# Patient Record
Sex: Female | Born: 1980
Health system: Southern US, Community
[De-identification: ages and names within clinical notes are randomized; demographics above are authoritative.]

## PROBLEM LIST (undated history)

## (undated) ENCOUNTER — Inpatient Hospital Stay (HOSPITAL_COMMUNITY): Payer: Self-pay

## (undated) DIAGNOSIS — D649 Anemia, unspecified: Secondary | ICD-10-CM

## (undated) DIAGNOSIS — R112 Nausea with vomiting, unspecified: Secondary | ICD-10-CM

## (undated) DIAGNOSIS — J189 Pneumonia, unspecified organism: Secondary | ICD-10-CM

## (undated) DIAGNOSIS — R87629 Unspecified abnormal cytological findings in specimens from vagina: Secondary | ICD-10-CM

## (undated) DIAGNOSIS — R51 Headache: Secondary | ICD-10-CM

## (undated) DIAGNOSIS — D219 Benign neoplasm of connective and other soft tissue, unspecified: Secondary | ICD-10-CM

## (undated) DIAGNOSIS — R7989 Other specified abnormal findings of blood chemistry: Secondary | ICD-10-CM

## (undated) DIAGNOSIS — Z86718 Personal history of other venous thrombosis and embolism: Secondary | ICD-10-CM

## (undated) DIAGNOSIS — E876 Hypokalemia: Secondary | ICD-10-CM

## (undated) DIAGNOSIS — J302 Other seasonal allergic rhinitis: Secondary | ICD-10-CM

## (undated) DIAGNOSIS — Z8742 Personal history of other diseases of the female genital tract: Secondary | ICD-10-CM

## (undated) DIAGNOSIS — F988 Other specified behavioral and emotional disorders with onset usually occurring in childhood and adolescence: Secondary | ICD-10-CM

## (undated) DIAGNOSIS — R011 Cardiac murmur, unspecified: Secondary | ICD-10-CM

## (undated) DIAGNOSIS — K5090 Crohn's disease, unspecified, without complications: Secondary | ICD-10-CM

## (undated) DIAGNOSIS — I10 Essential (primary) hypertension: Secondary | ICD-10-CM

## (undated) DIAGNOSIS — G43019 Migraine without aura, intractable, without status migrainosus: Secondary | ICD-10-CM

## (undated) DIAGNOSIS — K529 Noninfective gastroenteritis and colitis, unspecified: Secondary | ICD-10-CM

## (undated) DIAGNOSIS — Z9889 Other specified postprocedural states: Secondary | ICD-10-CM

## (undated) DIAGNOSIS — F419 Anxiety disorder, unspecified: Secondary | ICD-10-CM

## (undated) DIAGNOSIS — R55 Syncope and collapse: Secondary | ICD-10-CM

## (undated) HISTORY — DX: Syncope and collapse: R55

## (undated) HISTORY — DX: Migraine without aura, intractable, without status migrainosus: G43.019

## (undated) HISTORY — PX: TYMPANOSTOMY TUBE PLACEMENT: SHX32

## (undated) HISTORY — DX: Noninfective gastroenteritis and colitis, unspecified: K52.9

## (undated) HISTORY — DX: Other specified behavioral and emotional disorders with onset usually occurring in childhood and adolescence: F98.8

## (undated) HISTORY — PX: ADENOIDECTOMY: SUR15

## (undated) HISTORY — PX: TONSILLECTOMY: SUR1361

## (undated) HISTORY — PX: ENDOMETRIAL ABLATION: SHX621

---

## 1999-07-13 ENCOUNTER — Emergency Department (HOSPITAL_COMMUNITY): Admission: EM | Admit: 1999-07-13 | Discharge: 1999-07-13 | Payer: Self-pay | Admitting: Emergency Medicine

## 2000-03-06 ENCOUNTER — Ambulatory Visit (HOSPITAL_COMMUNITY): Admission: RE | Admit: 2000-03-06 | Discharge: 2000-03-06 | Payer: Self-pay | Admitting: *Deleted

## 2000-04-27 ENCOUNTER — Inpatient Hospital Stay (HOSPITAL_COMMUNITY): Admission: AD | Admit: 2000-04-27 | Discharge: 2000-04-30 | Payer: Self-pay | Admitting: *Deleted

## 2000-12-02 ENCOUNTER — Emergency Department (HOSPITAL_COMMUNITY): Admission: EM | Admit: 2000-12-02 | Discharge: 2000-12-03 | Payer: Self-pay | Admitting: Emergency Medicine

## 2001-03-31 ENCOUNTER — Encounter: Payer: Self-pay | Admitting: Emergency Medicine

## 2001-03-31 ENCOUNTER — Emergency Department (HOSPITAL_COMMUNITY): Admission: EM | Admit: 2001-03-31 | Discharge: 2001-03-31 | Payer: Self-pay | Admitting: *Deleted

## 2002-10-28 ENCOUNTER — Other Ambulatory Visit: Admission: RE | Admit: 2002-10-28 | Discharge: 2002-10-28 | Payer: Self-pay | Admitting: Obstetrics and Gynecology

## 2003-02-27 ENCOUNTER — Inpatient Hospital Stay (HOSPITAL_COMMUNITY): Admission: EM | Admit: 2003-02-27 | Discharge: 2003-03-01 | Payer: Self-pay | Admitting: *Deleted

## 2003-02-27 ENCOUNTER — Encounter: Payer: Self-pay | Admitting: *Deleted

## 2003-03-01 ENCOUNTER — Encounter: Payer: Self-pay | Admitting: *Deleted

## 2003-11-02 ENCOUNTER — Other Ambulatory Visit: Admission: RE | Admit: 2003-11-02 | Discharge: 2003-11-02 | Payer: Self-pay | Admitting: Obstetrics and Gynecology

## 2005-01-04 ENCOUNTER — Other Ambulatory Visit: Admission: RE | Admit: 2005-01-04 | Discharge: 2005-01-04 | Payer: Self-pay | Admitting: Obstetrics and Gynecology

## 2005-05-03 ENCOUNTER — Emergency Department (HOSPITAL_COMMUNITY): Admission: EM | Admit: 2005-05-03 | Discharge: 2005-05-03 | Payer: Self-pay | Admitting: *Deleted

## 2005-05-14 ENCOUNTER — Ambulatory Visit: Payer: Self-pay | Admitting: Gastroenterology

## 2005-05-21 ENCOUNTER — Ambulatory Visit: Payer: Self-pay | Admitting: Gastroenterology

## 2005-05-21 ENCOUNTER — Ambulatory Visit (HOSPITAL_COMMUNITY): Admission: RE | Admit: 2005-05-21 | Discharge: 2005-05-21 | Payer: Self-pay | Admitting: Gastroenterology

## 2005-06-12 ENCOUNTER — Ambulatory Visit: Payer: Self-pay | Admitting: Gastroenterology

## 2005-06-18 ENCOUNTER — Encounter (INDEPENDENT_AMBULATORY_CARE_PROVIDER_SITE_OTHER): Payer: Self-pay | Admitting: *Deleted

## 2005-06-18 ENCOUNTER — Ambulatory Visit: Payer: Self-pay | Admitting: Gastroenterology

## 2005-06-28 ENCOUNTER — Ambulatory Visit: Payer: Self-pay | Admitting: Gastroenterology

## 2005-07-02 ENCOUNTER — Ambulatory Visit (HOSPITAL_COMMUNITY): Admission: RE | Admit: 2005-07-02 | Discharge: 2005-07-02 | Payer: Self-pay | Admitting: Gastroenterology

## 2005-08-14 ENCOUNTER — Ambulatory Visit: Payer: Self-pay | Admitting: Gastroenterology

## 2006-02-06 ENCOUNTER — Other Ambulatory Visit: Admission: RE | Admit: 2006-02-06 | Discharge: 2006-02-06 | Payer: Self-pay | Admitting: Obstetrics and Gynecology

## 2006-02-18 ENCOUNTER — Emergency Department (HOSPITAL_COMMUNITY): Admission: EM | Admit: 2006-02-18 | Discharge: 2006-02-18 | Payer: Self-pay | Admitting: Emergency Medicine

## 2006-02-20 ENCOUNTER — Emergency Department (HOSPITAL_COMMUNITY): Admission: EM | Admit: 2006-02-20 | Discharge: 2006-02-20 | Payer: Self-pay | Admitting: Emergency Medicine

## 2006-03-11 ENCOUNTER — Emergency Department (HOSPITAL_COMMUNITY): Admission: EM | Admit: 2006-03-11 | Discharge: 2006-03-12 | Payer: Self-pay | Admitting: Emergency Medicine

## 2006-03-13 ENCOUNTER — Ambulatory Visit: Payer: Self-pay | Admitting: Gastroenterology

## 2006-09-22 ENCOUNTER — Inpatient Hospital Stay (HOSPITAL_COMMUNITY): Admission: AD | Admit: 2006-09-22 | Discharge: 2006-09-22 | Payer: Self-pay | Admitting: Obstetrics and Gynecology

## 2006-10-02 ENCOUNTER — Ambulatory Visit: Payer: Self-pay | Admitting: Gastroenterology

## 2007-02-13 ENCOUNTER — Inpatient Hospital Stay (HOSPITAL_COMMUNITY): Admission: AD | Admit: 2007-02-13 | Discharge: 2007-02-13 | Payer: Self-pay | Admitting: Obstetrics and Gynecology

## 2007-02-20 ENCOUNTER — Inpatient Hospital Stay (HOSPITAL_COMMUNITY): Admission: AD | Admit: 2007-02-20 | Discharge: 2007-02-22 | Payer: Self-pay | Admitting: Obstetrics and Gynecology

## 2007-08-08 ENCOUNTER — Emergency Department (HOSPITAL_COMMUNITY): Admission: EM | Admit: 2007-08-08 | Discharge: 2007-08-09 | Payer: Self-pay | Admitting: Emergency Medicine

## 2007-08-24 ENCOUNTER — Emergency Department (HOSPITAL_COMMUNITY): Admission: EM | Admit: 2007-08-24 | Discharge: 2007-08-24 | Payer: Self-pay | Admitting: Emergency Medicine

## 2007-12-04 ENCOUNTER — Ambulatory Visit (HOSPITAL_COMMUNITY): Admission: RE | Admit: 2007-12-04 | Discharge: 2007-12-04 | Payer: Self-pay | Admitting: Family Medicine

## 2007-12-04 ENCOUNTER — Ambulatory Visit: Payer: Self-pay | Admitting: Vascular Surgery

## 2008-01-14 ENCOUNTER — Ambulatory Visit: Payer: Self-pay | Admitting: Cardiology

## 2008-01-20 ENCOUNTER — Encounter: Payer: Self-pay | Admitting: Cardiology

## 2008-01-20 ENCOUNTER — Ambulatory Visit: Payer: Self-pay

## 2008-01-20 ENCOUNTER — Ambulatory Visit: Payer: Self-pay | Admitting: Cardiology

## 2008-02-11 ENCOUNTER — Ambulatory Visit: Payer: Self-pay | Admitting: Cardiology

## 2008-04-13 ENCOUNTER — Emergency Department (HOSPITAL_COMMUNITY): Admission: EM | Admit: 2008-04-13 | Discharge: 2008-04-13 | Payer: Self-pay | Admitting: Emergency Medicine

## 2008-06-28 ENCOUNTER — Telehealth: Payer: Self-pay | Admitting: Gastroenterology

## 2008-07-12 DIAGNOSIS — K5090 Crohn's disease, unspecified, without complications: Secondary | ICD-10-CM

## 2008-07-12 DIAGNOSIS — M069 Rheumatoid arthritis, unspecified: Secondary | ICD-10-CM | POA: Insufficient documentation

## 2008-07-12 DIAGNOSIS — O99613 Diseases of the digestive system complicating pregnancy, third trimester: Secondary | ICD-10-CM

## 2008-07-12 DIAGNOSIS — K219 Gastro-esophageal reflux disease without esophagitis: Secondary | ICD-10-CM | POA: Insufficient documentation

## 2008-08-09 ENCOUNTER — Ambulatory Visit: Payer: Self-pay | Admitting: Gastroenterology

## 2008-08-09 DIAGNOSIS — K5909 Other constipation: Secondary | ICD-10-CM | POA: Insufficient documentation

## 2008-08-09 DIAGNOSIS — K5900 Constipation, unspecified: Secondary | ICD-10-CM | POA: Insufficient documentation

## 2008-08-09 DIAGNOSIS — K625 Hemorrhage of anus and rectum: Secondary | ICD-10-CM | POA: Insufficient documentation

## 2008-11-22 ENCOUNTER — Ambulatory Visit: Payer: Self-pay | Admitting: Gastroenterology

## 2009-07-27 ENCOUNTER — Ambulatory Visit: Payer: Self-pay | Admitting: Internal Medicine

## 2009-07-27 ENCOUNTER — Telehealth: Payer: Self-pay | Admitting: Gastroenterology

## 2009-07-27 ENCOUNTER — Encounter: Payer: Self-pay | Admitting: Gastroenterology

## 2009-07-27 DIAGNOSIS — K5080 Crohn's disease of both small and large intestine without complications: Secondary | ICD-10-CM | POA: Insufficient documentation

## 2009-07-27 DIAGNOSIS — R197 Diarrhea, unspecified: Secondary | ICD-10-CM | POA: Insufficient documentation

## 2009-07-27 DIAGNOSIS — R1031 Right lower quadrant pain: Secondary | ICD-10-CM | POA: Insufficient documentation

## 2009-07-28 ENCOUNTER — Telehealth: Payer: Self-pay | Admitting: Physician Assistant

## 2009-07-28 LAB — CONVERTED CEMR LAB
ALT: 9 units/L (ref 0–35)
AST: 16 units/L (ref 0–37)
Albumin: 4 g/dL (ref 3.5–5.2)
Alkaline Phosphatase: 77 units/L (ref 39–117)
BUN: 7 mg/dL (ref 6–23)
Basophils Absolute: 0 10*3/uL (ref 0.0–0.1)
Basophils Relative: 0.5 % (ref 0.0–3.0)
CO2: 30 meq/L (ref 19–32)
CRP, High Sensitivity: 1.5 (ref 0.00–5.00)
Calcium: 8.4 mg/dL (ref 8.4–10.5)
Chloride: 103 meq/L (ref 96–112)
Creatinine, Ser: 0.6 mg/dL (ref 0.4–1.2)
Eosinophils Absolute: 0.1 10*3/uL (ref 0.0–0.7)
Eosinophils Relative: 2 % (ref 0.0–5.0)
GFR calc non Af Amer: 125.12 mL/min (ref >60)
Glucose, Bld: 86 mg/dL (ref 70–99)
HCT: 38 % (ref 36.0–46.0)
Hemoglobin: 12.8 g/dL (ref 12.0–15.0)
Lymphocytes Relative: 31.7 % (ref 12.0–46.0)
Lymphs Abs: 1.6 10*3/uL (ref 0.7–4.0)
MCHC: 33.8 g/dL (ref 30.0–36.0)
MCV: 88.5 fL (ref 78.0–100.0)
Monocytes Absolute: 0.5 10*3/uL (ref 0.1–1.0)
Monocytes Relative: 8.7 % (ref 3.0–12.0)
Neutro Abs: 3 10*3/uL (ref 1.4–7.7)
Neutrophils Relative %: 57.1 % (ref 43.0–77.0)
Platelets: 239 10*3/uL (ref 150.0–400.0)
Potassium: 3.1 meq/L — ABNORMAL LOW (ref 3.5–5.1)
RBC: 4.29 M/uL (ref 3.87–5.11)
RDW: 13 % (ref 11.5–14.6)
Sodium: 141 meq/L (ref 135–145)
Total Bilirubin: 0.6 mg/dL (ref 0.3–1.2)
Total Protein: 8.1 g/dL (ref 6.0–8.3)
WBC: 5.2 10*3/uL (ref 4.5–10.5)
hCG, Beta Chain, Quant, S: 0.35 milliintl units/mL

## 2009-10-31 ENCOUNTER — Encounter (INDEPENDENT_AMBULATORY_CARE_PROVIDER_SITE_OTHER): Payer: Self-pay

## 2009-11-01 ENCOUNTER — Ambulatory Visit: Payer: Self-pay | Admitting: Gastroenterology

## 2009-11-09 ENCOUNTER — Telehealth: Payer: Self-pay | Admitting: Gastroenterology

## 2009-11-14 ENCOUNTER — Ambulatory Visit: Payer: Self-pay | Admitting: Gastroenterology

## 2009-11-16 ENCOUNTER — Encounter: Payer: Self-pay | Admitting: Gastroenterology

## 2009-11-16 ENCOUNTER — Telehealth: Payer: Self-pay | Admitting: Gastroenterology

## 2009-12-01 ENCOUNTER — Ambulatory Visit: Payer: Self-pay | Admitting: Gastroenterology

## 2010-10-24 NOTE — Progress Notes (Signed)
Summary: TRIAGE--Rectal bleeding X1  Medications Added ANUSOL-HC 25 MG  SUPP (HYDROCORTISONE ACETATE) Put one into rectum every night at bedtime for 10 days.       Phone Note Call from Patient Call back at Home Phone [phone]   Call For: DR [facility] Reason for Call: Acute Illness Summary of Call: Crohns flare up. Really bad on saturday with alot of rectal bleeding. Initial call taken by: Irwin Brakeman [facility],  June 28, 2008 10:35 AM  Follow-up for Phone Call        [place] left for patient to callback.Vivia Ewing LPN  June 28, 3544 11:04 AM   Follow-up by: Vivia Ewing LPN,  June 29, 6255 11:26 AM  Additional Follow-up for Phone Call Additional follow up Details #1::        Pt. has chronic constipation, had an episode of rectal pain/bleeding on Saturday. No further bleeding since. 1) Anusol HC supp. 1 pr qhs x 7-10 nights,sitz bath TID,Tucks pads to rectum TID,use baby wipes instead of toilet paper. 2) Miralax 17gm mixed in [place]. water or juice-daily.May use BID PRN. 3) Appt. for f/u on 07-16-08 at 9:45am 4) If symptoms become worse call back immediately.   Additional Follow-up by: Vivia Ewing LPN,  June 28, 3892 11:36 AM    Additional Follow-up for Phone Call Additional follow up Details #2::    ok Follow-up by: Inda Castle MD,  June 30, 2008 9:04 AM  New/Updated Medications: ANUSOL-HC 25 MG  SUPP (HYDROCORTISONE ACETATE) Put one into rectum every night at bedtime for 10 days.   Prescriptions: ANUSOL-HC 25 MG  SUPP (HYDROCORTISONE ACETATE) Put one into rectum every night at bedtime for 10 days.  #10 x 1   Entered by:   Vivia Ewing LPN   Authorized by:   Inda Castle MD   Signed by:   Vivia Ewing LPN on 73/42/8768   Method used:   Electronically to        [facility]. [address] # [phone]* (retail)       [address]       [place], [place]  [address]       Ph: [phone] or [phone]       Fax:  [phone]   RxID:   [id number]

## 2010-10-24 NOTE — Progress Notes (Signed)
Summary: Tylenol  Medications Added DARVOCET-N 100 100-650 MG TABS (PROPOXYPHENE N-APAP) Take 1 tab 6 hours, evening hours , causes drowsiness KLOR-CON M20 20 MEQ CR-TABS (POTASSIUM CHLORIDE CRYS CR) Take 1 tab twice daily x 7 days       Phone Note Call from Patient Call back at Home Phone [phone]   Caller: Patient Call For: Dr. [name] Reason for Call: Talk to Nurse Summary of Call: Amy Esterwood instructed pt to switch pain meds from Ibuprofen to Tylenol and to give [place] a call if this didnt help with pain- this per pt... pt calling to report that Tylenol is not helping with pain and she would like to know what to do Initial call taken by: Lucien Mons,  July 28, 2009 4:01 PM    New/Updated Medications: DARVOCET-N 100 100-650 MG TABS (PROPOXYPHENE N-APAP) Take 1 tab 6 hours, evening hours , causes drowsiness KLOR-CON M20 20 MEQ CR-TABS (POTASSIUM CHLORIDE CRYS CR) Take 1 tab twice daily x 7 days Prescriptions: KLOR-CON M20 20 MEQ CR-TABS (POTASSIUM CHLORIDE CRYS CR) Take 1 tab twice daily x 7 days  #14 x 0   Entered by:   Marisue Humble NCMA   Authorized by:   Alfredia Ferguson PA-c   Signed by:   Marisue Humble NCMA on 07/28/2009   Method used:   Electronically to        [facility]. [address] [phone]* (retail)       [address] [place], [place]  [address]       Ph: [phone] or [phone]       Fax: [phone]   RxID:   [id number] DARVOCET-N 100 100-650 MG TABS (PROPOXYPHENE N-APAP) Take 1 tab 6 hours, evening hours , causes drowsiness  #40 x 0   Entered by:   Marisue Humble NCMA   Authorized by:   Alfredia Ferguson PA-c   Signed by:   Marisue Humble NCMA on 07/28/2009   Method used:   Printed then faxed to ...       [facility] [address] [phone]* (retail)       [address]       [place], [place]  [address]       Ph: [phone] or [phone]       Fax: [phone]   RxID:   [id number]

## 2010-10-24 NOTE — Miscellaneous (Signed)
Summary: Lec previsit  Clinical Lists Changes  Medications: Added new medication of MOVIPREP 100 GM  SOLR (PEG-KCL-NACL-NASULF-NA ASC-C) As per prep instructions. - Signed Rx of MOVIPREP 100 GM  SOLR (PEG-KCL-NACL-NASULF-NA ASC-C) As per prep instructions.;  #1 x 0;  Signed;  Entered by: Cornelia Copa RN;  Authorized by: Inda Castle MD;  Method used: Electronically to [facility]. [place]. [phone]*, [address] [place], [place], [place]  [address], Ph: [phone] or [phone], Fax: [phone] Observations: Added new observation of ALLERGY REV: Done (11/01/2009 9:11)    Prescriptions: MOVIPREP 100 GM  SOLR (PEG-KCL-NACL-NASULF-NA ASC-C) As per prep instructions.  #1 x 0   Entered by:   Cornelia Copa RN   Authorized by:   Inda Castle MD   Signed by:   Cornelia Copa RN on 11/01/2009   Method used:   Electronically to        [facility]. [address] [phone]* (retail)       [address]       [place], [place]  [address]       Ph: [phone] or [phone]       Fax: [phone]   RxID:   [id number]

## 2010-10-24 NOTE — Letter (Signed)
Summary: Results Letter  [facility]  [address] [place], [place] [address]   Phone: [phone]  Fax: [phone]        November 16, 2009 MRN: [id number]    [facility] [place] [address] [place], [place]  [address]    Dear Ms. Vanderheyden,   Your biopsies demonstrated inflammatory changes only.    Please follow the recommendations previously discussed.  Should you have any immediate concerns or questions, feel free to contact me at the office.    Sincerely,  Barbette Hair. Arlyce Dice, M.D., [facility]          Sincerely,  Louis Meckel MD  This letter has been electronically signed by your physician.  Appended Document: Results Letter  Letter mailed 2.24.11

## 2010-10-24 NOTE — Progress Notes (Signed)
Summary: TRIAGE  Medications Added VICODIN ES 7.5-750 MG TABS (HYDROCODONE-ACETAMINOPHEN) Take one by mouth every 6 hours as needed for pain.       Phone Note Call from Patient Call back at Home Phone [phone]   Call For: DR [facility] Reason for Call: Refill Medication, Talk to Nurse Summary of Call: Was ordered some Darvocet for her about a month ago but she never pick up. Now she is having pain and when she tried to pick up at [facility] they adviced her that it was taken out of the market. Can we order an alternative to [facility]?   Pt also added that she wants to know if she can take Darvocet today Initial call taken by: Irwin Brakeman [facility],  November 09, 2009 11:28 AM  Follow-up for Phone Call        Dr [name], Please Advise  Call in vicodan 7.5/750 1 tab q6h as needed ok to take remaining darvocet Follow-up by: Inda Castle MD,  November 09, 2009 2:05 PM  Additional Follow-up for Phone Call Additional follow up Details #1::        Above MD orders reviewed with patient. Med phoned to pt. pharmacy. Pt. instructed to call back as needed.  Additional Follow-up by: Vivia Ewing LPN,  November 09, 1912 2:46 PM    New/Updated Medications: VICODIN ES 7.5-750 MG TABS (HYDROCODONE-ACETAMINOPHEN) Take one by mouth every 6 hours as needed for pain. Prescriptions: VICODIN ES 7.5-750 MG TABS (HYDROCODONE-ACETAMINOPHEN) Take one by mouth every 6 hours as needed for pain.  #30 x o   Entered by:   Vivia Ewing LPN   Authorized by:   Inda Castle MD   Signed by:   Vivia Ewing LPN on 78/29/5621   Method used:   Telephoned to ...       [facility] [address] [phone]* (retail)       [address]       [place], [place]  [address]       Ph: [phone] or [phone]       Fax: [phone]   RxID:   [id number]

## 2010-10-24 NOTE — Procedures (Signed)
Summary: Colonoscopy   Colonoscopy  Procedure date:  06/18/2005  Findings:      555.9  C rohn's Disease                                                        Location:  [facility].   Patient Name: Diane [name], Diane [name] MRN:  Procedure Procedures: Colonoscopy CPT: 4312295423.    with biopsy. CPT: X8550940.  Personnel: Endoscopist: Sandy Salaam. [name], MD.  Patient Consent: Procedure, Alternatives, Risks and Benefits discussed, consent obtained, from patient.  Indications  Surveillance of: Crohn's Disease.  History  Current Medications: Patient is not currently taking Coumadin.  Pre-Exam Physical: Performed Jun 18, 2005. Cardio-pulmonary exam, HEENT exam , Abdominal exam, Mental status exam WNL.  Exam Exam: Extent of exam reached: Terminal Ileum, extent intended: Terminal Ileum.  The cecum was identified by IC valve. Colon retroflexion performed. ASA Classification: I. Tolerance: fair, adequate exam.  Monitoring: Pulse and BP monitoring, Oximetry used. Supplemental O2 given. at 2 Liters.  Colon Prep Used Miralax for colon prep. Prep results: excellent.  Sedation Meds: Patient assessed and found to be appropriate for moderate (conscious) sedation. Sedation was managed by the Endoscopist. Fentanyl 100 mcg. given IV. Versed 12 given IV. Promethazine 12.5 mg given IV.  Findings - MUCOSAL ABNORMALITY: Ileum. Erythema present. Granularity present, Edema present. absent ulcers present. Biopsy/Mucosal Abn. taken. ICD9: Crohn's Disease: 555.9. Comments: Nodularity with edematous mucosa in distal 6-12 inches of TI.  TI proximal to this area appears normal.  NORMAL EXAM: to Ileum.  - NORMAL EXAM: Cecum to Rectum.   Assessment Abnormal examination, see findings above.  Diagnoses: 555.9: Crohn's Disease.   Events  Unplanned Interventions: No intervention was required.  Unplanned Events: There were no complications. Plans  Post Exam Instructions: Post sedation  instructions given.  Medication Plan: Await pathology.  Disposition: After procedure patient sent to recovery. After recovery patient sent home.  Scheduling/Referral: Office Visit, to [facility]. [name], MD, around Jun 25, 2005.   This report was created from the original endoscopy report, which was reviewed and signed by the above listed endoscopist.   cc. Hurshel Party, MD

## 2010-10-24 NOTE — Assessment & Plan Note (Signed)
Summary: ISSUES WITH CROHNS/YF  Medications Added ANUSOL-HC 25 MG SUPP (HYDROCORTISONE ACETATE) one per rectum before retiring HYOMAX-SL 0.125 MG SUBL (HYOSCYAMINE SULFATE) 2 Sublingual every 4 hours as needed      Allergies Added: NKDA  History of Present Illness Visit Type: follow up Primary GI MD: Erskine Emery MD [facility] Primary Provider:  Hurshel Party, MD, Chief Complaint: Crohn's History of Present Illness:   Ms. Paulita Cradle returned for evaluation of rectal bleeding.  Approximately  a week ago she developed a moderate amount of bright red blood per rectum consisted of passing of blood clots.She normally moves  her bowels one to 2 times a week.  With her bleeding bowels have remained solid.  Initially she had some rectal discomfort.  She has also been complaining of mild aching abdominal pain.  She has a history of Crohn's ileitis and currently is not taking any medicines.  In the past she is developed fever with the onset of a flare up of her Crohn's.  Since her initial episode she has had very small amounts of blood on the toilet tissue or mixed with the stools   GI Review of Systems    Reports abdominal pain.      Denies acid reflux, belching, bloating, chest pain, dysphagia with liquids, dysphagia with solids, heartburn, loss of appetite, nausea, vomiting, vomiting blood, weight loss, and  weight gain.      Reports constipation and  rectal bleeding.     Denies anal fissure, black tarry stools, change in bowel habit, diarrhea, diverticulosis, fecal incontinence, heme positive stool, hemorrhoids, irritable bowel syndrome, jaundice, light color stool, liver problems, and  rectal pain.     Prior Medications Reviewed Using: Patient Recall  Updated Prior Medication List: No Medications Current Allergies: No known allergies   Past Medical History:    Reviewed history from 07/12/2008 and no changes required:       Current Problems:        ARTHRITIS, RHEUMATOID (ICD-714.0)  GASTROESOPHAGEAL REFLUX DISEASE, MILD (ICD-530.81)       CROHN'S DISEASE (ICD-555.9)         Past Surgical History:    Reviewed history from 07/12/2008 and no changes required:       Unremarkable   Family History:    Family History of Heart Disease: Mother  Social History:    Occupation: [employer]    Patient has never smoked.     Alcohol Use - no    Illicit Drug Use - no   Risk Factors:  Tobacco use:  never Drug use:  no Alcohol use:  no   Review of Systems       The patient complains of anemia, arthritis/joint pain, back pain, heart murmur, muscle pains/cramps, and swelling of feet/legs.     Vital Signs:  Patient Profile:   30 Years Old Female Height:     68 inches Weight:      173.50 pounds BMI:     26.48 Pulse rate:   84 / minute Pulse rhythm:   regular BP sitting:   124 / 88  (left arm)  Vitals Entered By: June McMurray Henderson (August 09, 2008 3:20 PM)                  Physical Exam  She is a well-developed well-nourished female  skin: anicter  HEENT: normocephalic; PEERLA; no nasal or orpharyngeal abnormalities neck: supple nodes: no cervical adenopathy chest: clear cor:  no murmurs, gallops or rubs abd:  bowel sounds normoactive;  no abdominal masses, organomegaly; there is mild right periumbilical and right lower quadrant tenderness to palpation without guarding or rebound rectal: no masses; stool heme negative ext: no cyanosis, clubbing, or edema skeletal: no gross skeletal abnormalities neuro: alert, oriented x 3; no focal abnormalities     Impression & Recommendations:  Problem # 1:  HEMORRHAGE OF RECTUM AND ANUS (ICD-569.3) I suspect her bleeding is from internal hemorrhoids rather than a flare up of Crohn's disease.  She suffers from chronic constipation.  Recommendations  #1 Anusol HC suppositories #2 hyomax p.r.n. #3 fiber supplementation  Problem # 2:  CROHN'S DISEASE (ICD-555.9) Assessment: Unchanged  Problem # 3:  OTHER  CONSTIPATION (ICD-564.09) Plan fiber supplementation   Patient Instructions: 1)  Please schedule a follow-up appointment in 4 to 6 weeks. 2)  Call in 5-7 days if symptoms have not improved including constipation.    Prescriptions: HYOMAX-SL 0.125 MG SUBL (HYOSCYAMINE SULFATE) 2 Sublingual every 4 hours as needed  #20 x 2   Entered and Authorized by:   Inda Castle MD   Signed by:   Inda Castle MD on 08/09/2008   Method used:   Historical   RxID:   [id number] ANUSOL-HC 25 MG SUPP (HYDROCORTISONE ACETATE) one per rectum before retiring  #10 x 0   Entered and Authorized by:   Inda Castle MD   Signed by:   Inda Castle MD on 08/09/2008   Method used:   Historical   RxID:   [id number]  ]  Appended Document: ISSUES WITH CROHNS/YF    Clinical Lists Changes  Medications: Rx of ANUSOL-HC 25 MG SUPP (HYDROCORTISONE ACETATE) one per rectum before retiring;  #10 x 0;  Signed;  Entered by: Genella Mech CMA;  Authorized by: Inda Castle MD;  Method used: Electronically to [facility]. [place]. [phone]*, [address], [place], [place], [place]  [address], Ph: [phone] or [phone], Fax: [phone] Rx of HYOMAX-SL 0.125 MG SUBL (HYOSCYAMINE SULFATE) 2 Sublingual every 4 hours as needed;  #20 x 2;  Signed;  Entered by: Genella Mech CMA;  Authorized by: Inda Castle MD;  Method used: Electronically to [facility]. [place]. [phone]*, [address], [place], [place], [place]  [address], Ph: [phone] or [phone], Fax: [phone]    Prescriptions: HYOMAX-SL 0.125 MG SUBL (HYOSCYAMINE SULFATE) 2 Sublingual every 4 hours as needed  #20 x 2   Entered by:   Genella Mech CMA   Authorized by:   Inda Castle MD   Signed by:   Genella Mech CMA on 08/09/2008   Method used:   [facility]ronically to        [facility] [address] [phone]* (retail)       [address]       [place], [place]  [address]       Ph:  [phone] or [phone]       Fax: [phone]   RxID:   [id number] ANUSOL-HC 25 MG SUPP (HYDROCORTISONE ACETATE) one per rectum before retiring  #10 x 0   Entered by:   Genella Mech CMA   Authorized by:   Inda Castle MD   Signed by:   Genella Mech CMA on 08/09/2008   Method used:   Electronically to        [facility]. [address] [phone]* (retail)       [address]  [place], [place]  [address]       Ph: [phone] or [phone]       Fax: [phone]   RxID:   [id number]

## 2010-10-24 NOTE — Procedures (Signed)
Summary: Pathology  SP Surgical Pathology - STATUS: Final             By: Almyra Free MD , Malcolm Metro        Perform Date: 51Sep06 00:01  Ordered By: Arlyce Dice MD , Barbette Hair          Ordered Date: 26Sep06 12:40  Facility: [facility]                               Department: CPATH  Service Report Text  [facility]   [address] [address]   [place], [place] [address]   Telephone [phone] or [phone] Fax [phone]    REPORT OF SURGICAL PATHOLOGY    Case #: [id number]   Patient Name: Diane [name], Diane [name]   PID: [id number]   Pathologist: Cira Rue L. Almyra Free, MD   DOB/Age [birth year] (Age: 30) Gender: F   Date Taken: 06/18/2005   Date Received: 06/19/2005    FINAL DIAGNOSIS    ***MICROSCOPIC EXAMINATION AND DIAGNOSIS***    ENDOSCOPIC BIOPSY, ILEUM: TERMINAL ILEUM WITH MILD TO MODERATE   DISTORTION OF THE CRYPTS, PLASMACYTOSIS OF THE LAMINA PROPRIA,   VILLOUS BLUNTING AND PROMINENT PEYER' S PATCHES.    COMMENT   Interpretation in many areas is limited by prominent Peyer' s   patches. Overall, the findings favor a chronic ileitis. Crohn's   ileitis is in the differential. No granulomas are identified.   (ALL:jy) 06/20/05    jy   Date Reported: 06/20/2005 Arlene L. Almyra Free, MD   *** Electronically Signed Out By ALL ***    Clinical information   C/O abd pain. Terminal ileum biopsy to R/O Crohn' s (kv)    specimen(s) obtained   Ileum, biopsy    Gross Description   Received in formalin are tan, soft tissue fragments that are   submitted in toto. Number: Multiple   Size: 0.3 cm up to 0.4 cm (SHP:glk 06-19-05)    gk/

## 2010-10-24 NOTE — Progress Notes (Signed)
Summary: TRIAGE   Phone Note Call from Patient Call back at Home Phone [phone]   Caller: Patient Call For: Dr. [name] Reason for Call: Talk to Nurse Summary of Call: pt thinks her Crohn's is flaring up and would like to be seen asap Initial call taken by: Lucien Mons,  July 27, 2009 9:12 AM  Follow-up for Phone Call        Pt. c/o flare since Sunday, abd. pain, diarrhea, urgency. Denies blood. Not on any meds. Pt. wants to be seen ASAP. She will see Nicoletta Ba [facility] today at 11am. Follow-up by: Vivia Ewing LPN,  July 27, 8465 9:18 AM

## 2010-10-24 NOTE — Letter (Signed)
Summary: [facility] Instructions  [facility]  [address] [place], [place] [address]   Phone: [phone]  Fax: [phone]       Diane [name]    08/08/1981    MRN: [id number]        Procedure Day /Date:  Monday 11/14/2009     Arrival Time: 10:30 am      Procedure Time: 11:30 am     Location of Procedure:                    _x _  [facility] (4th Floor)                        PREPARATION FOR COLONOSCOPY WITH MOVIPREP   Starting 5 days prior to your procedure Wednesday 2/16 do not eat nuts, seeds, popcorn, corn, beans, peas,  salads, or any raw vegetables.  Do not take any fiber supplements (e.g. Metamucil, Citrucel, and Benefiber).  THE DAY BEFORE YOUR PROCEDURE         DATE: Sunday 2/20  1.  Drink clear liquids the entire day-NO SOLID FOOD  2.  Do not drink anything colored red or purple.  Avoid juices with pulp.  No orange juice.  3.  Drink at least 64 oz. (8 glasses) of fluid/clear liquids during the day to prevent dehydration and help the prep work efficiently.  CLEAR LIQUIDS INCLUDE: Water Jello Ice Popsicles Tea (sugar ok, no milk/cream) Powdered fruit flavored drinks Coffee (sugar ok, no milk/cream) Gatorade Juice: apple, white grape, white cranberry  Lemonade Clear bullion, consomm, broth Carbonated beverages (any kind) Strained chicken noodle soup Hard Candy                             4.  In the morning, mix first dose of MoviPrep solution:    Empty 1 Pouch A and 1 Pouch B into the disposable container    Add lukewarm drinking water to the top line of the container. Mix to dissolve    Refrigerate (mixed solution should be used within 24 hrs)  5.  Begin drinking the prep at 5:00 p.m. The MoviPrep container is divided by 4 marks.   Every 15 minutes drink the solution down to the next mark (approximately 8 oz) until the full liter is complete.   6.  Follow completed prep with 16 oz of clear liquid of your choice  (Nothing red or purple).  Continue to drink clear liquids until bedtime.  7.  Before going to bed, mix second dose of MoviPrep solution:    Empty 1 Pouch A and 1 Pouch B into the disposable container    Add lukewarm drinking water to the top line of the container. Mix to dissolve    Refrigerate  THE DAY OF YOUR PROCEDURE      DATE: Monday 2/21  Beginning at 6:30 a.m. (5 hours before procedure):         1. Every 15 minutes, drink the solution down to the next mark (approx 8 oz) until the full liter is complete.  2. Follow completed prep with 16 oz. of clear liquid of your choice.    3. You may drink clear liquids until 9:30 am  (2 HOURS BEFORE PROCEDURE).   MEDICATION INSTRUCTIONS  Unless otherwise instructed, you should take regular prescription medications with a small sip of water   as early as possible the morning  of your procedure.  Diabetic patients - see separate instructions.  Stop taking Plavix or Aggrenox on  _  _  (7 days before procedure).     Stop taking Coumadin on  _ _  (5 days before procedure).  Additional medication instructions: _         OTHER INSTRUCTIONS  You will need a responsible adult at least 31 years of age to accompany you and drive you home.   This person must remain in the waiting room during your procedure.  Wear loose fitting clothing that is easily removed.  Leave jewelry and other valuables at home.  However, you may wish to bring a book to read or  an iPod/MP3 player to listen to music as you wait for your procedure to start.  Remove all body piercing jewelry and leave at home.  Total time from sign-in until discharge is approximately 2-3 hours.  You should go home directly after your procedure and rest.  You can resume normal activities the  day after your procedure.  The day of your procedure you should not:   Drive   Make legal decisions   Operate machinery   Drink alcohol   Return to work  You will receive  specific instructions about eating, activities and medications before you leave.    The above instructions have been reviewed and explained to me by   Ulis Rias RN  November 01, 2009 9:45am    I fully understand and can verbalize these instructions _____________________________ Date _________

## 2010-10-24 NOTE — Procedures (Signed)
Summary: Colonoscopy  Patient: [name] Note: All result statuses are Final unless otherwise noted.  Tests: (1) Colonoscopy (COL)   COL Colonoscopy           DONE     [facility]     [address]     [place], [place]  [address]           COLONOSCOPY PROCEDURE REPORT           PATIENT:  Diane [name], Diane [name]  MR#:  [id number]     BIRTHDATE:  09/09/1981, 28 yrs. old  GENDER:  female           ENDOSCOPIST:  Barbette Hair. Arlyce Dice, MD     Referred by:           PROCEDURE DATE:  11/14/2009     PROCEDURE:  Colonoscopy with biopsy     ASA CLASS:  Class II     INDICATIONS:  Crohn's disease, abdominal pain           MEDICATIONS:   Fentanyl 100 mcg IV, Versed 7 mg IV, Benadryl 25 mg     IV           DESCRIPTION OF PROCEDURE:   After the risks benefits and     alternatives of the procedure were thoroughly explained, informed     consent was obtained.  Digital rectal exam was performed and     revealed no abnormalities.   The LB [id number] endoscope     was introduced through the anus and advanced to the terminal ileum     which was intubated for a short distance, without limitations.     The quality of the prep was excellent, using MoviPrep.  The     instrument was then slowly withdrawn as the colon was fully     examined.     <<PROCEDUREIMAGES>>     FINDINGS:  Nodular mucosa was found terminal ileum Questionable     nodularity. Multiple bxs were taken (see image3 and image4).  This     was otherwise a normal examination of the colon (see image1,     image5, image6, image7, image9, image12, and image13).   Retro     flexed views in the rectum revealed no abnormalities.    The scope     was then withdrawn from the patient and the procedure completed.           COMPLICATIONS:  None           ENDOSCOPIC IMPRESSION:     1) Nodular mucosa in the terminal ileum     2) Otherwise normal examination     RECOMMENDATIONS:     1) Await biopsy results     2) call office next 1-3  days to schedule followup visit in 1-2     weeks           REPEAT EXAM:  No           ______________________________     Barbette Hair. Arlyce Dice, MD           CC:           n.     eSIGNED:   Barbette Hair. Avamae Dehaan at 11/14/2009 12:43 PM           Michel Bickers, [id number]  Note: An exclamation mark (!) indicates a result that was not dispersed into the flowsheet. Document Creation Date: 11/14/2009 12:44 PM _______________________________________________________________________  (1) Order result status: Final  Collection or observation date-time: 11/14/2009 12:39 Requested date-time:  Receipt date-time:  Reported date-time:  Referring Physician:   Ordering Physician: Melvia Heaps [phone]) Specimen Source:  Source: Launa Grill Order Number: [id number] Lab site:

## 2010-10-24 NOTE — Assessment & Plan Note (Signed)
Summary: 3 MO F-UP/FH      Allergies Added: NKDA  History of Present Illness Visit Type: follow up Primary GI MD: Erskine Emery MD [facility] Primary Provider:  Hurshel Party, MD, Chief Complaint: follow-up visit/ 3 month History of Present Illness:   Diane [name] has returned for followup of her abdominal pain and rectal bleeding.  The bleeding has entirely subsided following treatment with Anusol HC suppositories.  Abdominal pain has largely subsided as well.  She has fleeting, mild pain in various parts of her abdomen lasting for seconds at a time.  She is moving her bowels more regularly without straining though he still will only to 3 times a week.  She declined to get her hyomax filled since she recalls that this medicine caused worsening abdominal pain in the past.  She clearly states that stress at work causes her to have abdominal pain   GI Review of Systems    Reports abdominal pain.     Location of  Abdominal pain: RUQ.    Denies acid reflux, belching, bloating, chest pain, dysphagia with liquids, dysphagia with solids, heartburn, loss of appetite, nausea, vomiting, vomiting blood, weight loss, and  weight gain.        Denies anal fissure, black tarry stools, change in bowel habit, constipation, diarrhea, diverticulosis, fecal incontinence, heme positive stool, hemorrhoids, irritable bowel syndrome, jaundice, light color stool, liver problems, rectal bleeding, and  rectal pain.   Prior Medications Reviewed Using: Patient Recall  Updated Prior Medication List: No Medications Current Allergies: No known allergies  Past Medical History:    Reviewed history from 07/12/2008 and no changes required:       Current Problems:        ARTHRITIS, RHEUMATOID (ICD-714.0)       GASTROESOPHAGEAL REFLUX DISEASE, MILD (ICD-530.81)       CROHN'S DISEASE (ICD-555.9)         Past Surgical History:    Reviewed history from 07/12/2008 and no changes required:       Unremarkable   Family  History:    Reviewed history from 08/09/2008 and no changes required:       Family History of Heart Disease: Mother  Social History:    Reviewed history from 08/09/2008 and no changes required:       Occupation: [employer]       Patient has never smoked.        Alcohol Use - no       Illicit Drug Use - no  Review of Systems       The patient complains of anemia, arthritis/joint pain, back pain, fatigue, headaches-new, heart murmur, itching, muscle pains/cramps, swelling of feet/legs, and thirst - excessive.    Vital Signs:  Patient Profile:   30 Years Old Female Height:     68 inches Weight:      170.50 pounds BMI:     26.02 Pulse rate:   80 / minute Pulse rhythm:   regular BP sitting:   124 / 86  (right arm)  Vitals Entered By: June McMurray [place] (November 22, 2008 4:19 PM)                   Impression & Recommendations:  Problem # 1:  HEMORRHAGE OF RECTUM AND ANUS (ICD-569.3) Rectal bleeding was likely secondary to hemorrhoidal bleeding.  Recommendations #1 repeat hemorrhoidall suppositories as needed  Problem # 2:  CROHN'S DISEASE (ICD-555.9) This appears to be in clinical remission.  Abdominal pain is nonspecific  but there is no current evidence for active Crohn's disease.  Recommendations #1 no further therapy at this point  Problem # 3:  OTHER CONSTIPATION (ICD-564.09) This is likely functional constipation and not related to any strictures.  Recommend -  fiber supplementation   Patient Instructions: 1)  cc Hurshel Party, MD

## 2010-10-24 NOTE — Procedures (Signed)
Summary: EGD   EGD  Procedure date:  05/21/2005  Findings:      Findings: Normal  Location: [facility]   Patient Name: Diane [name], Diane [name] MRN:  Procedure Procedures: Panendoscopy (EGD) CPT: 15183.  Personnel: Endoscopist: Sandy Salaam. [name], MD.  Indications Symptoms: Abdominal pain,  History  Current Medications: Patient is not currently taking Coumadin.  Pre-Exam Physical: Performed May 21, 2005  Cardio-pulmonary exam, HEENT exam, Abdominal exam WNL.  Exam Exam Info: Maximum depth of insertion Duodenum, intended Duodenum. Vocal cords visualized. Gastric retroflexion performed. ASA Classification: II. Tolerance: good.  Sedation Meds: Patient assessed and found to be appropriate for moderate (conscious) sedation. Fentanyl 75 mcg. given IV. Versed 7 mg. given IV. Cetacaine Spray 2 sprays given aerosolized.  Monitoring: BP and pulse monitoring done. Oximetry used. Supplemental O2 given at 2 Liters.  Findings - Normal: Proximal Esophagus to Duodenal 2nd Portion.   Assessment Normal examination.  Events  Unplanned Intervention: No unplanned interventions were required.  Unplanned Events: There were no complications. Plans Medication(s): Other: Pamine Forte 5 Q4h prn, starting May 21, 2005   Patient Education: Patient given standard instructions for: a normal exam.  Scheduling: Office Visit, to [facility]. [name], MD, around Jun 11, 2005.    This report was created from the original endoscopy report, which was reviewed and signed by the above listed endoscopist.    cc. [employer]

## 2010-10-24 NOTE — Letter (Signed)
Summary: [facility] Instructions  [facility]  [place], [place] [address]   Phone: [phone]  Fax: [phone]       Diane [name]    March 14, 1981    MRN: [id number]        Procedure Day /Date:08-02-09     Arrival Time: 1:00 PM      Procedure Time:2:00 PM     Location of Procedure:                      X   [place] (4th Floor)                       [place]   Starting 5 days prior to your procedure 07-28-09 do not eat nuts, seeds, popcorn, corn, beans, peas,  salads, or any raw vegetables.  Do not take any fiber supplements (e.g. Metamucil, Citrucel, and Benefiber).  THE DAY BEFORE YOUR PROCEDURE         DATE: 08-01-09  DAY: Monday  1.  Drink clear liquids the entire day-NO SOLID FOOD  2.  Do not drink anything colored red or purple.  Avoid juices with pulp.  No orange juice.  3.  Drink at least 64 oz. (8 glasses) of fluid/clear liquids during the day to prevent dehydration and help the prep work efficiently.  CLEAR LIQUIDS INCLUDE: Water Jello Ice Popsicles Tea (sugar ok, no milk/cream) Powdered fruit flavored drinks Coffee (sugar ok, no milk/cream) Gatorade Juice: apple, white grape, white cranberry  Lemonade Clear bullion, consomm, broth Carbonated beverages (any kind) Strained chicken noodle soup Hard Candy                             4.  In the morning, mix first dose of MoviPrep solution:    Empty 1 Pouch A and 1 Pouch B into the disposable container    Add lukewarm drinking water to the top line of the container. Mix to dissolve    Refrigerate (mixed solution should be used within 24 hrs)  5.  Begin drinking the prep at 5:00 p.m. The MoviPrep container is divided by 4 marks.   Every 15 minutes drink the solution down to the next mark (approximately 8 oz) until the full liter is complete.   6.  Follow completed prep with 16 oz of clear liquid of your choice (Nothing red or  purple).  Continue to drink clear liquids until bedtime.  7.  Before going to bed, mix second dose of MoviPrep solution:    Empty 1 Pouch A and 1 Pouch B into the disposable container    Add lukewarm drinking water to the top line of the container. Mix to dissolve    Refrigerate  THE DAY OF YOUR PROCEDURE      DATE: 08-02-09 DAY: Tuesday  Beginning at 9:00 AM (5 hours before procedure):         1. Every 15 minutes, drink the solution down to the next mark (approx 8 oz) until the full liter is complete.  2. Follow completed prep with 16 oz. of clear liquid of your choice.    3. You may drink clear liquids until  Noon (2 HOURS BEFORE PROCEDURE).   MEDICATION INSTRUCTIONS  Unless otherwise instructed, you should take regular prescription medications with a small sip of water   as early as possible the morning of your  procedure.           OTHER INSTRUCTIONS  You will need a responsible adult at least 31 years of age to accompany you and drive you home.   This person must remain in the waiting room during your procedure.  Wear loose fitting clothing that is easily removed.  Leave jewelry and other valuables at home.  However, you may wish to bring a book to read or  an iPod/MP3 player to listen to music as you wait for your procedure to start.  Remove all body piercing jewelry and leave at home.  Total time from sign-in until discharge is approximately 2-3 hours.  You should go home directly after your procedure and rest.  You can resume normal activities the  day after your procedure.  The day of your procedure you should not:   Drive   Make legal decisions   Operate machinery   Drink alcohol   Return to work  You will receive specific instructions about eating, activities and medications before you leave.    The above instructions have been reviewed and explained to me by   _______________________    I fully understand and can verbalize these  instructions _____________________________ Date _________

## 2010-10-24 NOTE — Assessment & Plan Note (Signed)
Summary: f.u after procedure ..em    History of Present Illness Visit Type: Follow-up Visit Primary GI MD: Melvia Heaps MD [facility] Primary Provider: n/a Requesting Provider: n/a Chief Complaint: F/U Colonoscopy, no problems History of Present Illness:   Ms. Dehne  returned following colonoscopy.  Question of Crohn's ileitis has been raised in the past.  At colonoscopy there was slight nodularity in the terminal ileum.  Colon was normal.  Biopsy demonstrated focal active inflammation.  Ms. Mefferd has no GI complaints including abdominal pain or diarrhea.  She was seen in November, 2010 after several days of diarrhea.  She was prescribed Pentasa but did not take this because of lack of insurance to cover her medications.   GI Review of Systems    Reports bloating.      Denies abdominal pain, acid reflux, belching, chest pain, dysphagia with liquids, dysphagia with solids, heartburn, loss of appetite, nausea, vomiting, vomiting blood, weight loss, and  weight gain.        Denies anal fissure, black tarry stools, change in bowel habit, constipation, diarrhea, diverticulosis, fecal incontinence, heme positive stool, hemorrhoids, irritable bowel syndrome, jaundice, light color stool, liver problems, rectal bleeding, and  rectal pain.    Current Medications (verified): 1)  Motrin Ib 200 Mg Tabs (Ibuprofen) .... Four Tablets By Mouth As Needed For Pain 2)  Vicodin Es 7.5-750 Mg Tabs (Hydrocodone-Acetaminophen) .... Take One By Mouth Every 6 Hours As Needed For Pain. 3)  Prednisone 5 Mg Tabs (Prednisone) .... Take As Directed  Allergies (verified): No Known Drug Allergies  Past History:  Past Medical History: Reviewed history from 07/27/2009 and no changes required. Current Problems:  ARTHRITIS, RHEUMATOID (ICD-714.0) GASTROESOPHAGEAL REFLUX DISEASE, MILD (ICD-530.81) CROHN'S DISEASE (ICD-555.9)/ILEITIS  Past Surgical History: Reviewed history from 07/12/2008 and no changes  required. Unremarkable  Family History: Reviewed history from 07/27/2009 and no changes required. Family History of Heart Disease: Mother No FH of Colon Cancer:  Social History: Reviewed history from 08/09/2008 and no changes required. Occupation: [employer] Patient has never smoked.  Alcohol Use - no Illicit Drug Use - no  Review of Systems       The patient complains of allergy/sinus, menstrual pain, and thirst - excessive.  The patient denies anemia, anxiety-new, arthritis/joint pain, back pain, blood in urine, breast changes/lumps, change in vision, confusion, cough, coughing up blood, depression-new, fainting, fatigue, fever, headaches-new, hearing problems, heart murmur, heart rhythm changes, itching, muscle pains/cramps, night sweats, nosebleeds, pregnancy symptoms, shortness of breath, skin rash, sleeping problems, sore throat, swelling of feet/legs, swollen lymph glands, thirst - excessive , urination - excessive , urination changes/pain, urine leakage, vision changes, and voice change.    Vital Signs:  Patient profile:   30 year old female Height:      68 inches Weight:      176.13 pounds BMI:     26.88 Pulse rate:   84 / minute Pulse rhythm:   regular BP sitting:   142 / 86  (left arm) Cuff size:   regular  Vitals Entered By: June McMurray CMA Duncan Dull) (December 01, 2009 10:03 AM)   Impression & Recommendations:  Problem # 1:  CROHN'S DISEASE-LARGE & SMALL INTESTINE (ICD-555.2) At this point I am not convinced that she has Crohn's ileitis.  There are suggestions of inflammatory disease in the ileum by her previous colonoscopy in 2006.  Her most recent exam was more equivocal.  She has a normal C-reactive protein.  Recommendations #1 no further GI workup or therapy  at this time.  I carefully instructed the patient contact me if she should develop recurrent problems.  Patient Instructions: 1)  The medication list was reviewed and reconciled.  All changed / newly prescribed  medications were explained.  A complete medication list was provided to the patient / caregiver.

## 2010-10-24 NOTE — Progress Notes (Signed)
Summary: TRIAGE-? re appt   Phone Note Call from Patient Call back at Home Phone [phone] Call back at Work Phone [phone]   Caller: Patient Call For: Diane [name] Reason for Call: Talk to Nurse Summary of Call: Patient states that Dr Diane [name] wanted to see her 2 weeks after her procedure but theres nothing available until 3-23. Wants to know if it's ok to wait that long. Initial call taken by: Ronalee Red,  November 16, 2009 4:15 PM  Follow-up for Phone Call        Pt. will see Dr.Kaplan  on 12-01-09 at 10am. Pt. instructed to call back as needed.  Follow-up by: Vivia Ewing LPN,  November 16, 3752 4:36 PM

## 2010-10-24 NOTE — Assessment & Plan Note (Signed)
Summary: CROHN'S FLARE          (DR.KAPLAN PT.)       Diane [name]    History of Present Illness Visit Type: follow up  Primary GI MD: Erskine Emery MD [facility] Primary Provider: n/a Requesting Provider: n/a Chief Complaint: Crohn's flare up. Pt states diarrhea, loss of appetite, and lower abd pain. History of Present Illness:   30YO FEMALE KNOWN TO DR Diane [name] WITH HX OF CROHNS ILEITIS WHICH HAS BEE IN REMISSION. HER LAST COLONOSCOPY WAS DONE IN 2006. SHE HAS NOT BEEN ON ANY MEDICATION OVER THE PAST COUPLE YEARS. SHE COMES IN TODAY WITH C/O ONSET OF LOWER ABDOMINAL PAIN AND CRAMPING OVER THE PAST SEVERAL DAYS. SHE HAS ALSO BEEN HAVING DIARRHEA WITH 4-5 BMS /DAY,NO BLOOD. SHE SAYS THIS FEELS [place]. NO FEVER/CHILLS. NO N/V. NO RECENT ABX OR KNOWN EXPOSURES.   GI Review of Systems    Reports abdominal pain.     Location of  Abdominal pain: lower abdomen.    Denies acid reflux, belching, bloating, chest pain, dysphagia with liquids, dysphagia with solids, heartburn, loss of appetite, nausea, vomiting, vomiting blood, and  weight loss.      Reports change in bowel habits and  diarrhea.     Denies anal fissure, black tarry stools, diverticulosis, fecal incontinence, heme positive stool, hemorrhoids, irritable bowel syndrome, jaundice, light color stool, liver problems, rectal bleeding, and  rectal pain.    Current Medications (verified): 1)  Motrin Ib 200 Mg Tabs (Ibuprofen) .... Four Tablets By Mouth As Needed For Pain  Allergies (verified): No Known Drug Allergies  Past History:  Past Medical History: Current Problems:  ARTHRITIS, RHEUMATOID (ICD-714.0) GASTROESOPHAGEAL REFLUX DISEASE, MILD (ICD-530.81) CROHN'S DISEASE (ICD-555.9)/ILEITIS  Past Surgical History: Reviewed history from 07/12/2008 and no changes required. Unremarkable  Family History: Family History of Heart Disease: Mother No FH of Colon Cancer:  Social History: Reviewed history from 08/09/2008 and no changes  required. Occupation: [employer] Patient has never smoked.  Alcohol Use - no Illicit Drug Use - no  Review of Systems  The patient denies allergy/sinus, anemia, anxiety-new, arthritis/joint pain, back pain, blood in urine, breast changes/lumps, change in vision, confusion, cough, coughing up blood, depression-new, fainting, fatigue, fever, headaches-new, hearing problems, heart murmur, heart rhythm changes, itching, menstrual pain, muscle pains/cramps, night sweats, nosebleeds, pregnancy symptoms, shortness of breath, skin rash, sleeping problems, sore throat, swelling of feet/legs, swollen lymph glands, thirst - excessive , urination - excessive , urination changes/pain, urine leakage, vision changes, and voice change.         ROS OTHERWISE AS IN HPI  Vital Signs:  Patient profile:   30 year old female Height:      68 inches Weight:      176 pounds BMI:     26.86 BSA:     1.94 Temp:     98.5 degrees F oral Pulse rate:   88 / minute Pulse rhythm:   regular BP sitting:   122 / 76  (left arm) Cuff size:   regular  Vitals Entered By: Hope Pigeon CMA (July 27, 2009 10:50 AM)  Physical Exam  General:  Well developed, well nourished, no acute distress. Head:  Normocephalic and atraumatic. Eyes:  PERRLA, no icterus. Lungs:  Clear throughout to auscultation. Heart:  Regular rate and rhythm; no murmurs, rubs,  or bruits. Abdomen:  SOFT, TENDER ACROSS LOWER ABDOMEN,RIGHT GREATER THAN LEFT,NO GUARDING OR REBOUND,BS+ Rectal:  NOT DONE Extremities:  No clubbing, cyanosis, edema or deformities  noted. Neurologic:  Alert and  oriented x4;  grossly normal neurologically. Psych:  Alert and cooperative. Normal mood and affect.   Impression & Recommendations:  Problem # 1:  ABDOMINAL PAIN RIGHT LOWER QUADRANT (ICD-789.03) Assessment New 30 YO FEMALE WITH HX OF CROHNS ILEITIS,WITH RECURRENT LOWER ABDOMINAL PAIN,DIARRHEA-CANNOT R/O INFECTIOUS BUT SUSPECT CROHNS EXACERBATION.  SCHEDULE FOR  COLONOSCOPY WITH DR Diane [name] AS PT HAS NOT HAD ANY EVALAUTION TO ASSESS EXTENT OF DISEASE FOR SEVERAL YEARS LABS A BELOW START  PENTASA 500;2 by mouth 4 TIMES DAILY TYLENOL AS NEEDED FOR PAIN-- SHE HAS BEEN INTOLERANT TO ANTISPASMOTICS IN THE PAST. Orders: Colonoscopy (Colon) TLB-CRP-High Sensitivity (C-Reactive Protein) (86140-FCRP)  Problem # 2:  CROHN'S DISEASE-LARGE & SMALL INTESTINE (ICD-555.2) Assessment: Comment Only SEEVABOVE  Problem # 3:  ARTHRITIS, RHEUMATOID (ICD-714.0) Assessment: Comment Only  Other Orders: TLB-CMP (Comprehensive Metabolic Pnl) (61443-XVQM) TLB-CBC Platelet - w/Differential (85025-CBCD) TLB-Preg Serum Quant (B-hCG) (84702-HCG-QN)  Patient Instructions: 1)  Please go to the lab, basement level. 2)  We have scheduled the colonoscopy with Dr. Deatra [name] for. Directions and conscous sedation brochure provided. 3)  We sent a prescription for Pentasa to your pharmacy. Samples provided.  4)  Take 2 tablets 4 times daily. 5)  The medication list was reviewed and reconciled.  All changed / newly prescribed medications were explained.  A complete medication list was provided to the patient / caregiver. Prescriptions: MOVIPREP 100 GM  SOLR (PEG-KCL-NACL-NASULF-NA ASC-C) As per prep instructions.  #1 x 0   Entered by:   Marisue Humble NCMA   Authorized by:   Alfredia Ferguson PA-c   Signed by:   Marisue Humble NCMA on 07/27/2009   Method used:   Electronically to        [facility]. [address] [phone]* (retail)       [address], [place]  [address]       Ph: [phone] or [phone]       Fax: [phone]   RxID:   [id number] PENTASA 500 MG CR-CAPS (MESALAMINE) Take 2 tab 4 times daily  #240 x 3   Entered by:   Marisue Humble NCMA   Authorized by:   Alfredia Ferguson PA-c   Signed by:   Marisue Humble NCMA on 07/27/2009   Method used:   Electronically to        [facility]. [address] [phone]* (retail)       [address]       [place], [place]  [address]       Ph: [phone] or [phone]       Fax: [phone]   RxID:   [id number]  Samples given 4 boxes, Lot # [id number], Exp Date: 09/2012

## 2010-12-23 ENCOUNTER — Emergency Department (HOSPITAL_BASED_OUTPATIENT_CLINIC_OR_DEPARTMENT_OTHER)
Admission: EM | Admit: 2010-12-23 | Discharge: 2010-12-23 | Disposition: A | Discharge status: Home / Self Care | Payer: BC Managed Care – PPO | Attending: Emergency Medicine | Admitting: Emergency Medicine

## 2010-12-23 DIAGNOSIS — K5090 Crohn's disease, unspecified, without complications: Secondary | ICD-10-CM | POA: Insufficient documentation

## 2010-12-23 DIAGNOSIS — H5710 Ocular pain, unspecified eye: Secondary | ICD-10-CM | POA: Insufficient documentation

## 2010-12-23 DIAGNOSIS — H019 Unspecified inflammation of eyelid: Secondary | ICD-10-CM | POA: Insufficient documentation

## 2011-02-06 NOTE — Assessment & Plan Note (Signed)
[facility] OFFICE NOTE   NAME:Diane [name], Diane [name]                      MRN:          [id number]  DATE:01/14/2008                            DOB:          March 05, 1981    Diane [name] is very pleasant 30 year old female.  She mentions having had  some type of palpitations for 3 to 4 months.  In the past month she has  had significant headaches and has had a nice evaluation through the  [facility].  Her diagnosis there is listed as migraine  headaches.  However, was also mentioned that she has had some  lightheadedness and presyncope that predates her headaches.  She had  some irregularity in her heart beat when she was evaluated at the  [facility].  EKG was not done.  She is now here for  further evaluation.  The patient does not have chest pain.  She has not  had true syncope.  She does describe palpitations and feels that her  heart rate is increased at that time.  There has been no documented  arrhythmia yet.   PAST MEDICAL HISTORY:   ALLERGIES:  No known drug allergies.   MEDICATIONS:  She may start Topamax soon.   OTHER MEDICAL PROBLEMS:  See the list below.   REVIEW OF SYSTEMS:  Her headaches are much better.  Overall her review  of systems today is negative.   PHYSICAL EXAM:  Weight is 183 pounds.  Blood pressure is 119/78.  Pulse  101 when checked originally but her EKG shows heart rate of 77.  The patient is oriented to person, time and place.  Affect is normal.  HEENT:  Reveals no xanthelasma.  She has normal extraocular motion.  There are no carotid bruits.  There is no jugular venous distention.  Lungs are clear.  Respiratory effort is not labored.  CARDIAC:  Exam reveals S1-S2.  There are no clicks.  There is a 2/6  systolic crescendo decrescendo murmur.  The abdomen is soft.  There is no peripheral edema.  There are no musculoskeletal deformities.   EKG is normal with a  normal sinus rhythm.  I now see that there is a  prior EKG dated approximately near the end of March.  That also showed  sinus rhythm.   PROBLEMS:  1. History of migraine headaches being managed by the [facility].  2. Episodes of lightheadedness and possibly presyncope that she has      had for 3-4 months.  3. History of palpitations.  We will obtain an event recorder to see      if we can capture this rhythm.  4. Soft systolic murmur.  2-D echo will be done to assess this.  I      have given permission to try the Topamax.  I have encouraged her to      go about full activities.  I will see her for followup.     Carlena Bjornstad, MD, [facility]  Electronically Signed    JDK/MedQ  DD:  01/14/2008  DT: 01/14/2008  Job #: [id number]   cc:   Orie Rout

## 2011-02-06 NOTE — Assessment & Plan Note (Signed)
[place] OFFICE NOTE   NAME:Diane [name]                      MRN:          [id number]  DATE:02/11/2008                            DOB:          August 26, 1981    Diane [name] is seen in consultation on January 14, 2008 and a complete note  was written.  At that time I heard a soft systolic murmur.  We decided  to proceed with a 2-D echo.  Study was done on January 20, 2008.  She has  good left ventricular function.  There was trivial pericardial effusion  of no clinical significance.  There was very mild mitral regurgitation.  No further workup was necessary.  The patient also wore an event  recorder because of episodes of lightheadedness and possible presyncope  for several months.  She wore the recorder for several weeks.  She has  sinus rhythm with mild sinus tachycardia at times.  With 1 episode of  dizziness, she had normal sinus rhythm.   Today in the office, she is feeling well.  She does not have any  complaints.  She has not had any recurrent significant symptoms.   PAST MEDICAL HISTORY:   ALLERGIES:  No known drug allergies.   MEDICATIONS:  She is not taking any medicines at this point.   OTHER MEDICAL PROBLEMS:  See the list on my note of January 14, 2008.   REVIEW OF SYSTEMS:  She really feels good at this time.  Review of  systems is negative.   PHYSICAL EXAM:  Blood pressure is 113/71.  Pulse is 90.  The patient is oriented to person, time and place.  Affect is normal.  HEENT:  Reveals no xanthelasma.  She has normal extraocular motion.  There are no carotid bruits.  There is no jugular venous distention.  She is here with her baby today.  Lungs are clear.  Respiratory effort is not labored.  CARDIAC:  Exam reveals S1-S2.  There are no clicks or significant  murmurs.  The abdomen is soft.  She had no peripheral edema.   The monitor strips are reviewed completely as outlined above.   Problems  are listed on my note of January 14, 2008.  #2.  History of some lightheadedness and presyncope.  I believe she does  not have a significant arrhythmia.  Further cardiac workup is not  needed.  She also has some mild palpitations and she does not have  significant arrhythmias.  #4.  Systolic murmur.  The patient has very trace mitral regurgitation.  No further workup is needed.   The patient needs no further cardiac workup.     Carlena Bjornstad, MD, [facility]  Electronically Signed    JDK/MedQ  DD: 02/11/2008  DT: 02/11/2008  Job #: [id number]   cc:   Orie Rout

## 2011-02-09 NOTE — Assessment & Plan Note (Signed)
[facility] OFFICE NOTE   NAME:[name], Diane WISDOM                      MRN:          [id number]  DATE:10/02/2006                            DOB:          [birth year]    PROBLEM:  Crohn's disease.  Diane [name] has returned for scheduled GI  followup.  She is [redacted] weeks pregnant.  She has no lower GI complaints at  this time.  She stopped her Pentasa on her own.  She does complain of  early satiety and increased belching.  She may have some pyrosis when  she lies down.   On exam, pulse 72, blood pressure 112/64, weight 156.   IMPRESSION:  1. Crohn's ileitis -- asymptomatic on no therapy.  2. Probable mild GERD.   RECOMMENDATIONS:  Antacids as needed.  Should her symptoms worsen and  more reminiscent of acid reflux, I would consider PPI, though I would  check with Dr. Garwin Brothers first.     Sandy Salaam. [name], MD,FACG  Electronically Signed    RDK/MedQ  DD: 10/02/2006  DT: 10/02/2006  Job #: [id number]   cc:   Servando Salina, M.D.

## 2011-02-09 NOTE — Consult Note (Signed)
Diane [name], Diane [name]               ACCOUNT NO.:  [id number]   MEDICAL RECORD NO.:  [id number]          PATIENT TYPE:  MAT   LOCATION:  MATC                          FACILITY:  [facility]   PHYSICIAN:  Diona Foley, M.D.   DATE OF BIRTH:  1981/06/13   DATE OF CONSULTATION:  09/22/2006  DATE OF DISCHARGE:                                 CONSULTATION   CHIEF COMPLAINT:  Right flank pain, [redacted] weeks pregnant.   HISTORY OF PRESENT ILLNESS:  This is a 30 year old gravida 2, para 41  African American female who is at [redacted] weeks gestation with an  uncomplicated pregnancy who presented to maternity admissions on  September 22, 2006 complaining of right flank pain for approximately 4  hours.  The patient stated the pain was a 9/10.  It was worse with  movement, particularly with a twisting motion.  She said it was a  stabbing pain.  She denies any nausea, vomiting, diarrhea, constipation,  fever, chills, sweats, dysuria, hematuria, or urinary frequency.  No  vaginal bleeding or uterine contractions.  She did have a similar pain  on the left side approximately a week ago that resolved spontaneously.   Prenatal care has been at [facility] with Dr. Garwin Brothers as her  primary physician.  It has been uncomplicated.   PAST OBSTETRIC HISTORY:  Spontaneous vaginal delivery x1.   PAST GYNECOLOGIC HISTORY:  No history of sexually transmitted infections  or abnormal Pap smears.   PAST MEDICAL HISTORY:  History of Crohn's disease.   PAST SURGICAL HISTORY:  None.   MEDICATIONS:  Prenatal vitamins.   ALLERGIES:  NONE.   PHYSICAL EXAMINATION:  VITAL SIGNS:  Blood pressure 103/65, pulse 87,  respirations 20, temperature 98.3.  Fetal heart tones 154 by Doppler.  GENERAL:  She is a well-developed, well-nourished [place] American  female who is in no acute distress.  ABDOMEN:  Gravid, soft, nontender.  Appears AGA.  There is no CVA  tenderness.  Slight tenderness to palpation in the right lumbar region.  No  obvious masses or deformities.  EXTREMITIES:  No clubbing, cyanosis, or edema.  CERVIX:  Closed, thick, and long per R.N.   LABORATORY DATA:  UA shows 15 of ketones; otherwise, negative.  CBC:  White count 7.8, hemoglobin 10.4, hematocrit 29, platelets 249.   ASSESSMENT:  A 30 year old gravida 2, para 52 African American female at  [redacted] weeks gestation with right flank pain, probable musculoskeletal  etiology.   PLAN:  1. No evidence of pyelonephritis or nephrolithiasis on exam.  2. Will try Flexeril 5 mg p.o. t.i.d. p.r.n. pain.  The patient states      her pain has improved since being here at the hospital and taking 2      Tylenol.  She is to continue using Tylenol 1 to 2 tablets p.o. q.4-      6h. p.r.n. pain.  3. She is to call for any vomiting, chills, dysuria, hematuria, or      worsening pain.  Otherwise, follow up in the office on her next      regularly scheduled appointment.  Diona Foley, M.D.  Electronically Signed     JW/MEDQ  D:  09/22/2006  T:  09/22/2006  Job:  [id number]

## 2011-02-09 NOTE — Discharge Summary (Signed)
Diane [name], Diane [name]                           ACCOUNT NO.:  [id number]   MEDICAL RECORD NO.:  [id number]                   PATIENT TYPE:  INP   LOCATION:  8563                                 FACILITY:  [facility]   PHYSICIAN:  Earlie Server. Tama Gander, M.D.                DATE OF BIRTH:  [birth year]   DATE OF ADMISSION:  02/27/2003  DATE OF DISCHARGE:  03/01/2003                                 DISCHARGE SUMMARY   DISCHARGE DIAGNOSES:  1. Fever/chill most likely from left upper lobe pneumonia.     a. Chest CT scan (February 27, 2003):  Small bilateral pleural effusions,        small pericardial effusion, left upper lobe mild infiltrates, no        pulmonary embolus.  2. Right foot pain of unknown etiology.     a. Plain x-ray of right foot (February 27, 2003):  No soft tissue swelling or        gas formation.  3. Question of hypoalbuminemia (2.7) causing small bilateral pleural     effusion and small pericardial effusion.   PAST MEDICAL HISTORY:  1. Crohn's disease.  2. Chronic anemia.   HISTORY OF PRESENT ILLNESS:  The patient is a 30 year old African American  lady with history of Crohn's disease who presents with a fever, chill and  right foot pain.  On the morning of admission, the patient woke up with the  above symptoms.  The patient also complained of shortness of breath with  exertion today.  She also complained of mild frontal headache worse with  moving.   HOSPITAL COURSE:  PROBLEM #1 -  FEVER/CHILL MOST LIKELY FROM LEFT UPPER LOBE  PNEUMONIA:  Although the patient did not complain of any respiratory  symptoms other than mild shortness of breath on exertion (such as cough,  sputum, pleuritic chest pain), based on a CT finding and her fever, it was  thought that the patient most likely had a pneumonia.  This was treated with  IV Tequin over a two-day period and the patient subjectively felt much  better over the next two days.   PROBLEM #2 -  RIGHT FOOT PAIN OF UNKNOWN ETIOLOGY:  On  admission, the  patient was noted to have significant right foot pain with tenderness.  Given the patient's fever, soft tissue infection was considered.  Therefore,  simple x-ray was obtained which ruled out any soft tissue swelling or gas  formation.  Since there was an infiltrate in left upper lobe on chest CT  scan, having two infections was thought to be much less likely.  The right  foot was monitored clinically over the next two days.  The patient felt much  better with pain control and her right foot did not develop any signs of  infection such as redness, swelling, or worsening of tenderness.   PROBLEM #3 -  SMALL BILATERAL PLEURAL EFFUSION/PERICARDIAL  EFFUSION ON CHEST  CT SCAN:  The possible etiology for this incidental finding could be her  hypoalbuminemia (albumin 2.7 on admission) or some type of syndrome related  with her pneumonia.  On follow-up chest x-ray on hospital day #3, there was  no pleural effusion seen on plain chest x-ray.  If the patient's clinical  status worsens as an outpatient over the next few weeks and the pleural  effusion or pericardial effusion increases, either thoracentesis or  pericardiocentesis may have to be done for further diagnostic work-up.  However, this was not thought to be necessary at the time of discharge.   CONDITION ON DISCHARGE:  Improved.   DISPOSITION:  The patient is discharged to home.   DISCHARGE MEDICATION:  Tequin 400 mg p.o. daily for the next 14 days.   FOLLOW UP:  As needed.  (The patient was encouraged to seek primary care  doctor).                                                Earlie Server. Tama Gander, M.D.    MSR/MEDQ  D:  03/01/2003  T:  03/01/2003  Job:  [id number]

## 2011-05-04 ENCOUNTER — Emergency Department (INDEPENDENT_AMBULATORY_CARE_PROVIDER_SITE_OTHER): Payer: BC Managed Care – PPO

## 2011-05-04 ENCOUNTER — Encounter: Payer: Self-pay | Admitting: *Deleted

## 2011-05-04 ENCOUNTER — Emergency Department (HOSPITAL_BASED_OUTPATIENT_CLINIC_OR_DEPARTMENT_OTHER)
Admission: EM | Admit: 2011-05-04 | Discharge: 2011-05-05 | Disposition: A | Discharge status: Home / Self Care | Payer: BC Managed Care – PPO | Attending: Emergency Medicine | Admitting: Emergency Medicine

## 2011-05-04 DIAGNOSIS — K5090 Crohn's disease, unspecified, without complications: Secondary | ICD-10-CM | POA: Insufficient documentation

## 2011-05-04 DIAGNOSIS — M7989 Other specified soft tissue disorders: Secondary | ICD-10-CM

## 2011-05-04 DIAGNOSIS — M79609 Pain in unspecified limb: Secondary | ICD-10-CM | POA: Insufficient documentation

## 2011-05-04 DIAGNOSIS — I319 Disease of pericardium, unspecified: Secondary | ICD-10-CM

## 2011-05-04 DIAGNOSIS — M25579 Pain in unspecified ankle and joints of unspecified foot: Secondary | ICD-10-CM

## 2011-05-04 DIAGNOSIS — R42 Dizziness and giddiness: Secondary | ICD-10-CM | POA: Insufficient documentation

## 2011-05-04 DIAGNOSIS — J90 Pleural effusion, not elsewhere classified: Secondary | ICD-10-CM

## 2011-05-04 DIAGNOSIS — M79604 Pain in right leg: Secondary | ICD-10-CM

## 2011-05-04 HISTORY — DX: Pneumonia, unspecified organism: J18.9

## 2011-05-04 HISTORY — DX: Crohn's disease, unspecified, without complications: K50.90

## 2011-05-04 LAB — CBC
HCT: 35 % — ABNORMAL LOW (ref 36.0–46.0)
Hemoglobin: 11.8 g/dL — ABNORMAL LOW (ref 12.0–15.0)
MCH: 28.9 pg (ref 26.0–34.0)
MCHC: 33.7 g/dL (ref 30.0–36.0)
MCV: 85.6 fL (ref 78.0–100.0)
Platelets: 241 10*3/uL (ref 150–400)
RBC: 4.09 MIL/uL (ref 3.87–5.11)
RDW: 12.8 % (ref 11.5–15.5)
WBC: 7.2 10*3/uL (ref 4.0–10.5)

## 2011-05-04 LAB — D-DIMER, QUANTITATIVE: D-Dimer, Quant: 0.47 ug/mL-FEU (ref 0.00–0.48)

## 2011-05-04 LAB — BASIC METABOLIC PANEL
BUN: 11 mg/dL (ref 6–23)
CO2: 28 mEq/L (ref 19–32)
Calcium: 8.9 mg/dL (ref 8.4–10.5)
Chloride: 104 mEq/L (ref 96–112)
Creatinine, Ser: 0.5 mg/dL (ref 0.50–1.10)
GFR calc Af Amer: >60 mL/min (ref >60)
GFR calc non Af Amer: >60 mL/min (ref >60)
Glucose, Bld: 77 mg/dL (ref 70–99)
Potassium: 3.3 mEq/L — ABNORMAL LOW (ref 3.5–5.1)
Sodium: 142 mEq/L (ref 135–145)

## 2011-05-04 MED ORDER — MORPHINE SULFATE 4 MG/ML IJ SOLN
4.0000 mg | Freq: Once | INTRAMUSCULAR | Status: AC
  Administered 2011-05-04: 4 mg via INTRAVENOUS
  Filled 2011-05-04: qty 1

## 2011-05-04 NOTE — ED Notes (Addendum)
Pt c/o right lower leg swelling x1 week and today her ankle began swelling and same became painful. Pt also c/o near syncopal episodes and headaches.

## 2011-05-04 NOTE — ED Provider Notes (Signed)
History     CSN: [id number] Arrival date & time: 05/04/2011  9:06 PM  Chief Complaint  Patient presents with  . Leg Swelling   Patient is a 30 y.o. female presenting with lower extremity pain. The history is provided by the patient. No language interpreter was used.  Foot Pain This is a new problem. The current episode started 2 days ago. The problem occurs constantly. The problem has been gradually worsening. Pertinent negatives include no chest pain, no abdominal pain, no headaches and no shortness of breath. The symptoms are aggravated by walking. The symptoms are relieved by nothing. She has tried nothing for the symptoms.   patient noted swelling over the dorsum of the right foot and extending into the right ankle 2 days ago. She says she's had increasing pain with this. There is no history of trauma. Patient's no history of blood clots. Patient does have a mother with a history of having DVTs in the past. Patient's had no chest pain or shortness of breath. She does admit that over the past week or so she has had some periods of dizziness and lightheadedness. She has had these in the past. She was uncertain if these were related to the pain in her leg. She can this evening and she was concerned about this possibility. She's not currently dizzy, lightheaded, or experiencing chest pain or shortness of breath.  Past Medical History  Diagnosis Date  . Crohn's disease   . Pneumonia     History reviewed. No pertinent past surgical history.  History reviewed. No pertinent family history.  History  Substance Use Topics  . Smoking status: Never Smoker   . Smokeless tobacco: Not on file  . Alcohol Use: No    OB History    Grav Para Term Preterm Abortions TAB SAB Ect Mult Living                  Review of Systems  Constitutional: Negative.   HENT: Negative.   Eyes: Negative.   Respiratory: Negative.  Negative for shortness of breath.   Cardiovascular: Negative.  Negative for chest  pain.  Gastrointestinal: Negative.  Negative for abdominal pain.  Genitourinary: Negative.   Musculoskeletal:       Right lower extremity swelling and tenderness is described in history of present illness  Skin: Negative.   Neurological: Positive for light-headedness. Negative for headaches.  Hematological: Negative.   Psychiatric/Behavioral: Negative.   All other systems reviewed and are negative.    Physical Exam  BP 117/70  Pulse 70  Temp(Src) 99.1 F (37.3 C) (Oral)  Resp 18  Ht 5\' 8"  (1.727 m)  Wt 179 lb 14.3 oz (81.6 kg)  BMI 27.35 kg/m2  SpO2 100%  LMP 04/25/2011  Physical Exam  Nursing note and vitals reviewed. Constitutional: She is oriented to person, place, and time. She appears well-developed and well-nourished. No distress.  HENT:  Head: Normocephalic and atraumatic.  Eyes: Conjunctivae and EOM are normal. Pupils are equal, round, and reactive to light.  Neck: Normal range of motion.  Cardiovascular: Normal rate, regular rhythm, normal heart sounds and intact distal pulses.  Exam reveals no gallop and no friction rub.   No murmur heard. Pulmonary/Chest: Effort normal and breath sounds normal. No respiratory distress. She has no wheezes. She has no rales. She exhibits no tenderness.  Abdominal: Soft. Bowel sounds are normal. She exhibits no distension. There is no tenderness. There is no rebound and no guarding.  Musculoskeletal: Normal range of  motion. She exhibits edema and tenderness.       Right ankle: She exhibits swelling. tenderness.       Feet:  Neurological: She is alert and oriented to person, place, and time. No cranial nerve deficit. She exhibits normal muscle tone. Coordination normal.  Skin: Skin is warm and dry. No rash noted.  Psychiatric: She has a normal mood and affect.    ED Course  Procedures  MDM Patient was examined by myself and did have swelling of the right lower extremity greater than left. Patient did have palpable pulses.  Swelling was located mainly over the dorsum of the right foot but did extend into the right lower leg up to the level of the right knee. Patient not have tenderness to palpation on the calf posteriorly. Patient did not have any deformity or evidence of trauma. She also had no recollection of this. Lab work including a CBC a d-dimer and a renal were performed. Patient also had plain films performed. All the studies were reviewed and were remarkable mainly for a d-dimer that was marginally elevated. Given the patient has had a recent history of lightheadedness she did have a CTPA ordered.  This returned negative. On secondary review of the patient's results patient was just below the cutoff for normal for d-dimer. Patient should be a little be discharged home in good condition with instructions to followup with her primary care physician for this. Should patient's symptoms worsen she'll return to the ED further emergent concerns.  Assessment: 30 year-old female with right lower extremity pain and minimal edema.  Plan: Discharge home in good condition with instructions to followup with her primary care physician if symptoms persist and return if she has any other emergent concerns.     Cyndra Numbers, MD 05/05/11 [phone]

## 2011-05-05 MED ORDER — IOHEXOL 350 MG/ML SOLN
80.0000 mL | Freq: Once | INTRAVENOUS | Status: AC | PRN
  Administered 2011-05-05: 80 mL via INTRAVENOUS

## 2011-05-05 MED ORDER — OXYCODONE-ACETAMINOPHEN 5-325 MG PO TABS
2.0000 | ORAL_TABLET | Freq: Once | ORAL | Status: AC
  Administered 2011-05-05: 2 via ORAL
  Filled 2011-05-05: qty 2

## 2011-05-09 ENCOUNTER — Ambulatory Visit (INDEPENDENT_AMBULATORY_CARE_PROVIDER_SITE_OTHER): Payer: BC Managed Care – PPO | Admitting: Family Medicine

## 2011-05-09 ENCOUNTER — Encounter (INDEPENDENT_AMBULATORY_CARE_PROVIDER_SITE_OTHER): Payer: BC Managed Care – PPO | Admitting: Cardiology

## 2011-05-09 ENCOUNTER — Encounter: Payer: Self-pay | Admitting: Family Medicine

## 2011-05-09 VITALS — BP 120/90 | HR 80 | Temp 99.0°F | Resp 12 | Ht 66.25 in | Wt 175.0 lb

## 2011-05-09 DIAGNOSIS — R600 Localized edema: Secondary | ICD-10-CM

## 2011-05-09 DIAGNOSIS — R609 Edema, unspecified: Secondary | ICD-10-CM

## 2011-05-09 DIAGNOSIS — Z862 Personal history of diseases of the blood and blood-forming organs and certain disorders involving the immune mechanism: Secondary | ICD-10-CM

## 2011-05-09 DIAGNOSIS — E876 Hypokalemia: Secondary | ICD-10-CM

## 2011-05-09 DIAGNOSIS — M7989 Other specified soft tissue disorders: Secondary | ICD-10-CM

## 2011-05-09 DIAGNOSIS — G43909 Migraine, unspecified, not intractable, without status migrainosus: Secondary | ICD-10-CM

## 2011-05-09 LAB — BASIC METABOLIC PANEL
BUN: 12 mg/dL (ref 6–23)
CO2: 29 mEq/L (ref 19–32)
Calcium: 8.8 mg/dL (ref 8.4–10.5)
Chloride: 108 mEq/L (ref 96–112)
Creatinine, Ser: 0.6 mg/dL (ref 0.4–1.2)
GFR: 123.65 mL/min (ref >60.00)
Glucose, Bld: 90 mg/dL (ref 70–99)
Potassium: 3.2 mEq/L — ABNORMAL LOW (ref 3.5–5.1)
Sodium: 143 mEq/L (ref 135–145)

## 2011-05-09 NOTE — Progress Notes (Signed)
  Subjective:    Patient ID: Diane [name], female    DOB: June 23, 1981, 30 y.o.   MRN: [id number]  HPI New patient to establish care. Past medical history of migraine headaches, reported heart murmur, and remote history of anemia. Recently she developed right foot, ankle, and leg edema (onset about 3 weeks ago). Went to the emergency room 10th day of this month for evaluation after some increased pain. Lab work significant for hemoglobin 11.8, potassium 3.3 and high normal d-dimer level. Patient did not have any dyspnea or pleuritic pain but received CT of chest which did not show any pulmonary embolus. Venous Doppler lower extremity not done. Edema is unchanged. She does have some calf tenderness. Denies dyspnea or pleuritic pain or hemoptysis at this time.  Denies recent leg injury  Question of Crohn's ileitis in past. Had recent colonoscopy 2011  Patient denies any prior history of clotting issue. Does not take birth control. Nonsmoker. Mother apparently did have history of either DVT or pulmonary embolus. Family history otherwise is revealing for mother having congestive heart fire or 39s. aunt with type 2 diabetes   Patient is single. Nonsmoker. No alcohol use.  Past Medical History  Diagnosis Date  . Crohn's disease   . Pneumonia    No past surgical history on file.  reports that she has never smoked. She does not have any smokeless tobacco history on file. She reports that she does not drink alcohol or use illicit drugs. family history includes Heart disease (age of onset:45) in her mother. No Known Allergies    Review of Systems  Constitutional: Negative for fever, chills, appetite change, fatigue and unexpected weight change.  Respiratory: Negative for cough, shortness of breath and wheezing.   Cardiovascular: Positive for leg swelling (right leg only). Negative for chest pain and palpitations.  Musculoskeletal: Negative for gait problem.  Skin: Negative for rash.    Neurological: Negative for weakness.  Hematological: Negative for adenopathy. Does not bruise/bleed easily.  Psychiatric/Behavioral: Negative for dysphoric mood.       Objective:   Physical Exam  Constitutional: She is oriented to person, place, and time. She appears well-developed and well-nourished. No distress.  HENT:  Mouth/Throat: Oropharynx is clear and moist.  Eyes: Pupils are equal, round, and reactive to light.  Neck: Neck supple. No thyromegaly present.  Cardiovascular: Normal rate, regular rhythm and normal heart sounds.  Exam reveals no gallop.   Pulmonary/Chest: Effort normal and breath sounds normal. No respiratory distress. She has no wheezes. She has no rales.  Musculoskeletal:       Patient has some obvious edema right foot, right ankle, and right leg. Minimal diffuse right calf tenderness. No thigh tenderness  Lymphadenopathy:    She has no cervical adenopathy.  Neurological: She is alert and oriented to person, place, and time.  Skin: No rash noted.  Psychiatric: She has a normal mood and affect. Her behavior is normal.          Assessment & Plan:  #1 right leg and foot edema. Recent borderline elevated d-dimer with negative CT scan of chest. Check venous Dopplers to rule out DVT #2 history of migraine headaches  #3 reported history of anemia  minimal by recent labs in emergency room.  Discussed iron replacement. #4 mild hypokalemia with potassium 3.3. Patient does not take any diuretics. Discussed high potassium diet and reassess basic metabolic panel today

## 2011-05-09 NOTE — Patient Instructions (Signed)
Foods Rich in Potassium  Food  Portion  Potassium   (mg)  Food  Portion  Potassium   (mg)    Apricots, dried  1/4 cup  378  Orange juice  1 cup  443    Apricots, raw   1 cup halves  401  Peaches, dried  1/4 cup  398    Avocado  1/2  487  Peas, split, cooked  1/2 cup  355    Banana  1 large  487  Potato, boiled  1 medium  515    Beef   lean, round  3 oz  202  Prunes, dried, uncooked  1/4 cup  318    Cantaloupe  1 cup cubes  427  Raisins  1/4 cup  309    Dates, medjool  5 whole  835  Salmon, pink, raw  3 oz  275    Ham, cured  3 oz  212  Sardines, canned  3 oz  338    Lentils, dried  1/4 cup  458  Tomato, raw  1 medium  292    Lima beans, frozen  1/2 cup  258  Tomato juice  6 fl oz  417    Orange  1 large  333  Turkey  3 oz  349    Source: USDA National Nutrient Database  Document Released: 09/10/2005 Document Re-Released: 02/28/2010  [employer] Patient Information 2011 [employer].

## 2011-05-10 ENCOUNTER — Other Ambulatory Visit: Payer: Self-pay | Admitting: Family Medicine

## 2011-05-10 ENCOUNTER — Telehealth: Payer: Self-pay | Admitting: *Deleted

## 2011-05-10 DIAGNOSIS — E876 Hypokalemia: Secondary | ICD-10-CM

## 2011-05-10 MED ORDER — POTASSIUM CHLORIDE CRYS ER 20 MEQ PO TBCR: 20.0000 meq | EXTENDED_RELEASE_TABLET | Freq: Every day | ORAL | Status: DC

## 2011-05-10 NOTE — Telephone Encounter (Signed)
VM from pt (new pt yesterday) reporting her swelling in her lower extremity has increased today. Questioning if she needs return OV of what to do?

## 2011-05-10 NOTE — Progress Notes (Signed)
Quick Note:  Pt informed, future orders done ______

## 2011-05-10 NOTE — Telephone Encounter (Signed)
Doppler negative for DVT.  Needs to get back on support hose if she is willing. Follow up next week to reassess and discuss possible mild diuretic then.

## 2011-05-11 ENCOUNTER — Telehealth: Payer: Self-pay | Admitting: Family Medicine

## 2011-05-11 NOTE — Telephone Encounter (Signed)
Pt called back again and has sch an ov for Wed 05/16/11 at 8:30am, to reasses legs and discuss diuretic.

## 2011-05-11 NOTE — Telephone Encounter (Signed)
Pt informed of neg doppler and support hose.  She would prefer to return for OV to discuss dizziness and swelling again next Wed.

## 2011-05-11 NOTE — Telephone Encounter (Signed)
Pt informed, she plans to return for OV next week to discuss

## 2011-05-11 NOTE — Telephone Encounter (Signed)
Pt called to get status of her question from previous call. Pt has been notified with info Dr Elease Hashimoto put in previous note re: sch a fup appt to reasses legs and to discuss diurectic. Pt will call back to sch ov for next week. Pt did sch for lab appt for the future labs that were ordered.

## 2011-05-16 ENCOUNTER — Encounter: Payer: Self-pay | Admitting: Family Medicine

## 2011-05-16 ENCOUNTER — Ambulatory Visit (INDEPENDENT_AMBULATORY_CARE_PROVIDER_SITE_OTHER): Payer: BC Managed Care – PPO | Admitting: Family Medicine

## 2011-05-16 DIAGNOSIS — R600 Localized edema: Secondary | ICD-10-CM

## 2011-05-16 DIAGNOSIS — R42 Dizziness and giddiness: Secondary | ICD-10-CM

## 2011-05-16 DIAGNOSIS — E876 Hypokalemia: Secondary | ICD-10-CM | POA: Insufficient documentation

## 2011-05-16 DIAGNOSIS — R609 Edema, unspecified: Secondary | ICD-10-CM

## 2011-05-16 LAB — POCT URINALYSIS DIPSTICK
Bilirubin, UA: NEGATIVE
Blood, UA: NEGATIVE
Glucose, UA: NEGATIVE
Ketones, UA: NEGATIVE
Leukocytes, UA: NEGATIVE
Nitrite, UA: NEGATIVE
Spec Grav, UA: 1.02
Urobilinogen, UA: 0.2
pH, UA: 6

## 2011-05-16 LAB — BASIC METABOLIC PANEL
BUN: 13 mg/dL (ref 6–23)
CO2: 26 mEq/L (ref 19–32)
Calcium: 8.6 mg/dL (ref 8.4–10.5)
Chloride: 109 mEq/L (ref 96–112)
Creatinine, Ser: 0.7 mg/dL (ref 0.4–1.2)
GFR: 107 mL/min (ref >60.00)
Glucose, Bld: 88 mg/dL (ref 70–99)
Potassium: 3.8 mEq/L (ref 3.5–5.1)
Sodium: 142 mEq/L (ref 135–145)

## 2011-05-16 LAB — TSH: TSH: 0.99 u[IU]/mL (ref 0.35–5.50)

## 2011-05-16 NOTE — Progress Notes (Signed)
  Subjective:    Patient ID: Diane [name], female    DOB: [birth year], 30 y.o.   MRN: [id number]  HPI Medical followup. Recent onset about one month ago of right lower extremity edema. Refer to prior note. Went to emergency room. High normal d-dimer level. CT chest no pulmonary embolus. Subsequent outpatient venous Doppler no DVT. Edema unchanged. She has some mild edema left lower extremity mostly right lower extremity. No progressive pain. Patient also had hypokalemia in emergency room and persistent hypokalemia and follow up here week ago. We started low-dose potassium replacement.  No recent diarrhea. No diuretic use  Patient does relate some persistent fatigue issues. Cold intolerance. Thyroid normal several years ago but not checked in some time. Occasional dizziness which is nonspecific lightheadedness. No syncope. No palpitations. Denies dyspnea. No vertigo history. Currently takes no other prescription medications.   Review of Systems  Constitutional: Positive for fatigue. Negative for fever, chills, activity change, appetite change and unexpected weight change.  Respiratory: Negative for cough and shortness of breath.   Cardiovascular: Positive for leg swelling. Negative for chest pain and palpitations.  Gastrointestinal: Negative for abdominal pain.  Neurological: Positive for dizziness. Negative for syncope, weakness and headaches.       Objective:   Physical Exam  Constitutional: She is oriented to person, place, and time. She appears well-developed and well-nourished. No distress.  Eyes: Pupils are equal, round, and reactive to light.  Neck: Neck supple. No thyromegaly present.  Cardiovascular: Normal rate, regular rhythm and normal heart sounds.   No murmur heard. Pulmonary/Chest: Effort normal and breath sounds normal. No respiratory distress. She has no wheezes. She has no rales.  Musculoskeletal:       Patient has edema right lower extremity greater than left. Trace pitting  on the right and very minimal pitting on the left. Right is definitely more swollen than left. No color changes. She has some superficial varicosities legs and feet bilaterally  Lymphadenopathy:    She has no cervical adenopathy.  Neurological: She is alert and oriented to person, place, and time. No cranial nerve deficit.  Psychiatric: She has a normal mood and affect. Her behavior is normal.          Assessment & Plan:  #1 asymmetric edema right lower extremity greater than left. Recent negative venous Doppler. Venous compression garments. Recommend regular exercise. If persists consider consult with venous expert recessed 3 months.  Also check TSH and urine dipstick to rule out proteinuria #2 hypokalemia. Recheck basic metabolic panel. #3 nonspecific dizziness. Standing blood pressure systolic 102. Encouraged increased hydration.

## 2011-05-18 NOTE — Progress Notes (Signed)
Quick Note:  Pt informed ______ 

## 2011-05-25 ENCOUNTER — Other Ambulatory Visit (INDEPENDENT_AMBULATORY_CARE_PROVIDER_SITE_OTHER): Payer: BC Managed Care – PPO

## 2011-05-25 DIAGNOSIS — E876 Hypokalemia: Secondary | ICD-10-CM

## 2011-05-25 LAB — BASIC METABOLIC PANEL
BUN: 10 mg/dL (ref 6–23)
CO2: 27 mEq/L (ref 19–32)
Calcium: 8.3 mg/dL — ABNORMAL LOW (ref 8.4–10.5)
Chloride: 108 mEq/L (ref 96–112)
Creatinine, Ser: 0.6 mg/dL (ref 0.4–1.2)
GFR: 114.74 mL/min (ref >60.00)
Glucose, Bld: 84 mg/dL (ref 70–99)
Potassium: 3.2 mEq/L — ABNORMAL LOW (ref 3.5–5.1)
Sodium: 141 mEq/L (ref 135–145)

## 2011-05-25 NOTE — Progress Notes (Signed)
Quick Note:  Pt informed, future order done ______

## 2011-05-29 ENCOUNTER — Encounter: Payer: Self-pay | Admitting: Family Medicine

## 2011-05-29 ENCOUNTER — Ambulatory Visit (INDEPENDENT_AMBULATORY_CARE_PROVIDER_SITE_OTHER): Payer: BC Managed Care – PPO | Admitting: Family Medicine

## 2011-05-29 VITALS — BP 110/80 | Temp 98.4°F | Wt 178.0 lb

## 2011-05-29 DIAGNOSIS — M79606 Pain in leg, unspecified: Secondary | ICD-10-CM

## 2011-05-29 DIAGNOSIS — M79609 Pain in unspecified limb: Secondary | ICD-10-CM

## 2011-05-29 DIAGNOSIS — R609 Edema, unspecified: Secondary | ICD-10-CM

## 2011-05-29 MED ORDER — TRAMADOL HCL 50 MG PO TABS: 50.0000 mg | ORAL_TABLET | Freq: Four times a day (QID) | ORAL | Status: AC | PRN

## 2011-05-29 NOTE — Progress Notes (Signed)
  Subjective:    Patient ID: Diane [name], female    DOB: 04/07/1981, 30 y.o.   MRN: [id number]  HPI Followup leg pain and edema. Recent asymmetric edema right greater than left. Now presents with some left pain worse than right somewhat poorly localized. Recent venous Doppler negative. She has not filled compressive stockings yet. Recent hypokalemia with potassium 3.2. Not taking diuretics.  She is trying to increase potassium rich foods. She also complains of fleeting numbness left foot but denies any back pain. Denies lower extremity weakness. No specific arthralgias. Leg pain is mostly calf region but also back of knee and very poorly localized. No rashes.   Review of Systems  Constitutional: Negative for fever, chills, fatigue and unexpected weight change.  Respiratory: Negative for cough and shortness of breath.   Cardiovascular: Negative for chest pain.  Musculoskeletal: Negative for back pain, joint swelling, arthralgias and gait problem.  Skin: Negative for rash.  Hematological: Negative for adenopathy. Does not bruise/bleed easily.       Objective:   Physical Exam  Constitutional: She appears well-developed and well-nourished. No distress.  Neck: Neck supple.  Cardiovascular: Normal rate and regular rhythm.   Pulmonary/Chest: Effort normal and breath sounds normal. No respiratory distress. She has no wheezes. She has no rales.  Musculoskeletal: She exhibits edema.       Both feet are warm to touch. Good dorsalis pedis pulses bilaterally. Good capillary refill. No specific calf muscle tenderness. Full range of motion ankle and knee bilaterally.          Assessment & Plan:  Nonspecific leg pain. Possibly secondary to edema. She has some venous stasis. We emphasized importance of weight loss, regular exercise, and venous compression. Would not use any diuretics at this time with low potassium Hypokalemia. Recheck basic metabolic panel in 2-3 weeks

## 2011-05-29 NOTE — Patient Instructions (Addendum)
Establish regular exercise. Elevate legs frequently Eat plenty of potassium rich foods. Work on weight loss. Go ahead and start compression garments as tolerated.

## 2011-05-31 ENCOUNTER — Telehealth: Payer: Self-pay | Admitting: *Deleted

## 2011-05-31 NOTE — Telephone Encounter (Signed)
Pt states Dr. Caryl Never advised her to call if she still has numbness in her left leg.  This continues with no other new symptoms, and would like to know what to do next?

## 2011-05-31 NOTE — Telephone Encounter (Signed)
Does she have any new weakness?  If not, observe unless this is progressing.  Also, be in touch if any other areas of numbness.

## 2011-05-31 NOTE — Telephone Encounter (Signed)
Pt states NO other new symptoms.  Will ask to observe. Will call with any new numbness or weakness. Notified pt.

## 2011-06-15 ENCOUNTER — Telehealth: Payer: Self-pay | Admitting: Family Medicine

## 2011-06-15 NOTE — Telephone Encounter (Signed)
Pt is having trouble swallowing potassium pills. Pt would like to switch to small pill etc. Pt stated pain med is not working. [facility] [place] [phone]

## 2011-06-15 NOTE — Telephone Encounter (Signed)
I'm not sure if there is a smaller K pill.  We do have liquid K but this is VERY bitter.  Check with pharmacy to see if smaller K supplement available .

## 2011-06-18 NOTE — Telephone Encounter (Signed)
I called pt pharmacy and spoke with pharmacist.  She suggested the pt come in and look at options of size of pill.  Pt is now supposed to take 2 potassium pills, she has tried cutting them in 1/2, but then they disinagrate?    Pt is taking Tramadol during the day, however she is requesting something stronger for bedtime use and Tramadol does no help at all.

## 2011-06-18 NOTE — Telephone Encounter (Signed)
I would not advice chronic narcotic use for this.  If she is having that much pain we need to reassess to discuss other possible treatment options. Make sure she is wearing compression support garments.

## 2011-06-19 NOTE — Telephone Encounter (Signed)
Pt informed, she just said OK when asked about compression garmants

## 2011-06-22 LAB — POCT PREGNANCY, URINE
Operator id: 280141
Preg Test, Ur: NEGATIVE

## 2011-06-22 LAB — HEMOGLOBIN AND HEMATOCRIT, BLOOD
HCT: 33.2 — ABNORMAL LOW
Hemoglobin: 11 — ABNORMAL LOW

## 2011-07-02 ENCOUNTER — Other Ambulatory Visit: Payer: BC Managed Care – PPO

## 2011-07-03 LAB — RAPID STREP SCREEN (MED CTR MEBANE ONLY): Streptococcus, Group A Screen (Direct): NEGATIVE

## 2011-07-17 ENCOUNTER — Ambulatory Visit (INDEPENDENT_AMBULATORY_CARE_PROVIDER_SITE_OTHER): Payer: BC Managed Care – PPO | Admitting: Family Medicine

## 2011-07-17 ENCOUNTER — Encounter: Payer: Self-pay | Admitting: Family Medicine

## 2011-07-17 VITALS — BP 150/82 | Temp 98.5°F | Wt 179.0 lb

## 2011-07-17 DIAGNOSIS — T24211A Burn of second degree of right thigh, initial encounter: Secondary | ICD-10-CM

## 2011-07-17 DIAGNOSIS — T24219A Burn of second degree of unspecified thigh, initial encounter: Secondary | ICD-10-CM
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
## 2011-08-01 ENCOUNTER — Ambulatory Visit (INDEPENDENT_AMBULATORY_CARE_PROVIDER_SITE_OTHER): Payer: BC Managed Care – PPO | Admitting: Family Medicine

## 2011-08-01 ENCOUNTER — Encounter: Payer: Self-pay | Admitting: Family Medicine

## 2011-08-01 VITALS — BP 120/90 | Temp 99.2°F | Wt 179.0 lb

## 2011-08-01 DIAGNOSIS — R42 Dizziness and giddiness: Secondary | ICD-10-CM

## 2011-08-01 DIAGNOSIS — R002 Palpitations: Secondary | ICD-10-CM

## 2011-08-01 DIAGNOSIS — Z862 Personal history of diseases of the blood and blood-forming organs and certain disorders involving the immune mechanism: Secondary | ICD-10-CM

## 2011-08-01 NOTE — Progress Notes (Signed)
  Subjective:    Patient ID: Diane [name], female    DOB: Mar 16, 1981, 30 y.o.   MRN: [id number]  HPI  Patient seen with vague complaints of dizziness and fatigue over the past couple of weeks. Her lightheaded symptoms are intermittent and not consistent. These are not consistently related to position change and symptoms occur at rest. No vertigo. Denies syncope. No palpitations. Denies any chest pain or dyspnea. No clear orthostatic symptoms. Symptoms can occur for several minutes and sometimes longer. No clear triggering factors. History of mild anemia with hemoglobin 11.8 back in August. She's had some heavy menses since then.  Echocardiogram 2009 revealed trivial mitral regurgitation with no other abnormalities. Patient had mild hypokalemia in the past. Currently not taking any medications. She's had some nonspecific fatigue recently. Recent TSH normal. She has intermittent mild headaches which are bifrontal. No associated visual changes, nausea, vomiting, or a focal neurologic symptoms. No clear triggering factors.  ? Of Crohn's disease listed under problem list but I do not see any confirmation of that from records. No abdominal pain or any stool changes.  Past Medical History  Diagnosis Date  . Crohn's disease   . Pneumonia    No past surgical history on file.  reports that she has never smoked. She does not have any smokeless tobacco history on file. She reports that she does not drink alcohol or use illicit drugs. family history includes Heart disease (age of onset:45) in her mother. No Known Allergies    Review of Systems  Constitutional: Positive for fatigue. Negative for fever, chills and unexpected weight change.  HENT: Negative for trouble swallowing.   Eyes: Negative for photophobia and visual disturbance.  Respiratory: Negative for cough and shortness of breath.   Cardiovascular: Negative for chest pain and leg swelling.  Gastrointestinal: Negative for abdominal pain and  blood in stool.  Genitourinary: Negative for dysuria.  Musculoskeletal: Negative for myalgias and arthralgias.  Skin: Negative for rash.  Neurological: Positive for dizziness and headaches. Negative for tremors, seizures and syncope.  Hematological: Negative for adenopathy. Does not bruise/bleed easily.       Objective:   Physical Exam  Constitutional: She is oriented to person, place, and time. She appears well-developed and well-nourished.  HENT:  Mouth/Throat: Oropharynx is clear and moist.  Eyes: Pupils are equal, round, and reactive to light.  Neck: Neck supple. No thyromegaly present.  Cardiovascular: Normal rate and regular rhythm.  Exam reveals no gallop.   Pulmonary/Chest: Effort normal and breath sounds normal. No respiratory distress. She has no wheezes. She has no rales.  Musculoskeletal: She exhibits no edema.  Lymphadenopathy:    She has no cervical adenopathy.  Neurological: She is alert and oriented to person, place, and time.  Skin: No rash noted.  Psychiatric: She has a normal mood and affect. Her behavior is normal.          Assessment & Plan:  #1 vague dizziness. She describes lightheadedness. No orthostatic change with standing blood pressure 120/90. History of mild anemia. EKG shows sinus rhythm with no acute abnormality. Repeat CBC. Consider Holter monitor if symptoms persist #2 fatigue. Unclear etiology. Reassess hemoglobin as above. Recent thyroid function normal. #3 history of mild hypokalemia. No diuretic use. Repeat basic metabolic panel. If she has any recurrent hypokalemia needs further evaluation to rule out adrenal abnormality

## 2011-08-02 LAB — CBC WITH DIFFERENTIAL/PLATELET
Basophils Absolute: 0.1 10*3/uL (ref 0.0–0.1)
Basophils Relative: 0.8 % (ref 0.0–3.0)
Eosinophils Absolute: 0.1 10*3/uL (ref 0.0–0.7)
Eosinophils Relative: 2 % (ref 0.0–5.0)
HCT: 37.3 % (ref 36.0–46.0)
Hemoglobin: 12.7 g/dL (ref 12.0–15.0)
Lymphocytes Relative: 29.4 % (ref 12.0–46.0)
Lymphs Abs: 2.1 10*3/uL (ref 0.7–4.0)
MCHC: 34 g/dL (ref 30.0–36.0)
MCV: 87 fl (ref 78.0–100.0)
Monocytes Absolute: 0.4 10*3/uL (ref 0.1–1.0)
Monocytes Relative: 5.4 % (ref 3.0–12.0)
Neutro Abs: 4.5 10*3/uL (ref 1.4–7.7)
Neutrophils Relative %: 62.4 % (ref 43.0–77.0)
Platelets: 241 10*3/uL (ref 150.0–400.0)
RBC: 4.29 Mil/uL (ref 3.87–5.11)
RDW: 14 % (ref 11.5–14.6)
WBC: 7.3 10*3/uL (ref 4.5–10.5)

## 2011-08-02 LAB — BASIC METABOLIC PANEL
BUN: 10 mg/dL (ref 6–23)
CO2: 25 mEq/L (ref 19–32)
Calcium: 8.7 mg/dL (ref 8.4–10.5)
Chloride: 105 mEq/L (ref 96–112)
Creatinine, Ser: 0.6 mg/dL (ref 0.4–1.2)
GFR: 125.88 mL/min (ref >60.00)
Glucose, Bld: 78 mg/dL (ref 70–99)
Potassium: 3.5 mEq/L (ref 3.5–5.1)
Sodium: 139 mEq/L (ref 135–145)

## 2011-08-03 NOTE — Progress Notes (Signed)
Quick Note:  Pt informed, she tried to monitor her pulse yesterday when she had an event of dizziness, however that did not work. I encouraged pt to diary her episodes with time of day, frequency and duration in the event she needs a follow-up visit ______

## 2011-08-21 ENCOUNTER — Ambulatory Visit: Payer: BC Managed Care – PPO | Admitting: Family Medicine

## 2011-08-31 ENCOUNTER — Ambulatory Visit (INDEPENDENT_AMBULATORY_CARE_PROVIDER_SITE_OTHER): Payer: BC Managed Care – PPO | Admitting: Family Medicine

## 2011-08-31 ENCOUNTER — Encounter: Payer: Self-pay | Admitting: Family Medicine

## 2011-08-31 VITALS — BP 120/72 | Temp 98.8°F | Wt 182.0 lb

## 2011-08-31 DIAGNOSIS — R209 Unspecified disturbances of skin sensation: Secondary | ICD-10-CM

## 2011-08-31 DIAGNOSIS — R609 Edema, unspecified: Secondary | ICD-10-CM

## 2011-08-31 DIAGNOSIS — R202 Paresthesia of skin: Secondary | ICD-10-CM

## 2011-08-31 DIAGNOSIS — Z23 Encounter for immunization: Secondary | ICD-10-CM

## 2011-08-31 MED ORDER — TRIAMTERENE-HCTZ 37.5-25 MG PO TABS: 1.0000 | ORAL_TABLET | Freq: Every day | ORAL | Status: DC

## 2011-08-31 NOTE — Progress Notes (Signed)
  Subjective:    Patient ID: Diane [name], female    DOB: 1981/07/25, 30 y.o.   MRN: [id number]  HPI  Patient seen with recurrent lower extremity edema. She had similar occurrence several months ago and we did several tests which were basically normal with exception of minimally low potassium. This was repeated last visit and normal. She was briefly on Hydrochlorothiazide. Initially when we saw her several months ago she had venous Doppler as she had right lower extremity greater than left this was negative for DVT. Recurrence of lower extremity edema started few days ago. No dyspnea. No orthopnea. Patient also relates one episode of very transient warm sensation left lower leg which lasted a couple minutes. Also one prior episode months ago mild numbness left lower extremity but none since then. Denies any back pain. No lower extremity weakness. No rashes.  Patient denies any recent dietary changes. Is not taking nonsteroidals. No regular prescription medications. He has some nonspecific symptoms of intermittent fatigue and palpitations. No recent palpitations or chest pain since last visit. Previous thyroid functions normal  Past Medical History  Diagnosis Date  . Crohn's disease   . Pneumonia    No past surgical history on file.  reports that she has never smoked. She does not have any smokeless tobacco history on file. She reports that she does not drink alcohol or use illicit drugs. family history includes Heart disease (age of onset:45) in her mother. No Known Allergies      Review of Systems  Constitutional: Negative for fever, chills, appetite change and unexpected weight change.  Respiratory: Negative for cough and shortness of breath.   Cardiovascular: Positive for leg swelling. Negative for chest pain and palpitations.  Gastrointestinal: Negative for abdominal pain.  Musculoskeletal: Negative for myalgias, joint swelling and arthralgias.  Neurological: Negative for dizziness,  weakness and numbness.       Objective:   Physical Exam  Constitutional: She is oriented to person, place, and time. She appears well-developed and well-nourished. No distress.  HENT:  Mouth/Throat: Oropharynx is clear and moist.  Neck: Neck supple. No thyromegaly present.  Cardiovascular: Normal rate, regular rhythm and normal heart sounds.   Pulmonary/Chest: Effort normal and breath sounds normal. No respiratory distress. She has no wheezes. She has no rales.  Musculoskeletal:       Trace edema lower legs bilaterally  Lymphadenopathy:    She has no cervical adenopathy.  Neurological: She is alert and oriented to person, place, and time.          Assessment & Plan:  #1 recurrent lower extremity edema. Previous workup unrevealing of etiology. Prescribed low-dose Maxzide for as needed use. Reduce sodium diet #2 intermittent dysesthesias left leg. Nonfocal exam. Observe for now.  Neurology evaluation if recur.  Patient requests urine pregnancy. She is not late for menstrual period but currently not using any protection

## 2011-09-07 ENCOUNTER — Telehealth: Payer: Self-pay | Admitting: *Deleted

## 2011-09-07 DIAGNOSIS — R202 Paresthesia of skin: Secondary | ICD-10-CM

## 2011-09-07 NOTE — Telephone Encounter (Signed)
Pt is having tingling in left foot, leg and arm. It's last about min or so then goes away. This has been coming and going for awhile. Pt is wanting to know what the next step is.

## 2011-09-09 NOTE — Telephone Encounter (Signed)
Neurology referral and I will go ahead and refer.

## 2011-09-10 ENCOUNTER — Encounter: Payer: Self-pay | Admitting: Neurology

## 2011-09-10 NOTE — Telephone Encounter (Signed)
Pt aware of this. 

## 2011-10-22 ENCOUNTER — Ambulatory Visit (INDEPENDENT_AMBULATORY_CARE_PROVIDER_SITE_OTHER): Payer: BC Managed Care – PPO | Admitting: Neurology

## 2011-10-22 ENCOUNTER — Other Ambulatory Visit (INDEPENDENT_AMBULATORY_CARE_PROVIDER_SITE_OTHER): Payer: BC Managed Care – PPO

## 2011-10-22 ENCOUNTER — Encounter: Payer: Self-pay | Admitting: Neurology

## 2011-10-22 ENCOUNTER — Other Ambulatory Visit: Payer: Self-pay | Admitting: Neurology

## 2011-10-22 VITALS — BP 118/80 | HR 88 | Ht 66.25 in | Wt 179.0 lb

## 2011-10-22 DIAGNOSIS — G609 Hereditary and idiopathic neuropathy, unspecified: Secondary | ICD-10-CM

## 2011-10-22 LAB — CBC WITH DIFFERENTIAL/PLATELET
Basophils Absolute: 0 10*3/uL (ref 0.0–0.1)
Basophils Relative: 0.6 % (ref 0.0–3.0)
Eosinophils Absolute: 0.3 10*3/uL (ref 0.0–0.7)
Eosinophils Relative: 3.5 % (ref 0.0–5.0)
HCT: 39.1 % (ref 36.0–46.0)
Hemoglobin: 13.3 g/dL (ref 12.0–15.0)
Lymphocytes Relative: 28.1 % (ref 12.0–46.0)
Lymphs Abs: 2 10*3/uL (ref 0.7–4.0)
MCHC: 34.1 g/dL (ref 30.0–36.0)
MCV: 88 fl (ref 78.0–100.0)
Monocytes Absolute: 0.3 10*3/uL (ref 0.1–1.0)
Monocytes Relative: 4.4 % (ref 3.0–12.0)
Neutro Abs: 4.5 10*3/uL (ref 1.4–7.7)
Neutrophils Relative %: 63.4 % (ref 43.0–77.0)
Platelets: 241 10*3/uL (ref 150.0–400.0)
RBC: 4.44 Mil/uL (ref 3.87–5.11)
RDW: 14 % (ref 11.5–14.6)
WBC: 7.1 10*3/uL (ref 4.5–10.5)

## 2011-10-22 LAB — COMPREHENSIVE METABOLIC PANEL
ALT: 10 U/L (ref 0–35)
AST: 16 U/L (ref 0–37)
Albumin: 3.9 g/dL (ref 3.5–5.2)
Alkaline Phosphatase: 65 U/L (ref 39–117)
BUN: 11 mg/dL (ref 6–23)
CO2: 26 mEq/L (ref 19–32)
Calcium: 8.8 mg/dL (ref 8.4–10.5)
Chloride: 106 mEq/L (ref 96–112)
Creatinine, Ser: 0.6 mg/dL (ref 0.4–1.2)
GFR: 128.2 mL/min (ref >60.00)
Glucose, Bld: 86 mg/dL (ref 70–99)
Potassium: 3.3 mEq/L — ABNORMAL LOW (ref 3.5–5.1)
Sodium: 141 mEq/L (ref 135–145)
Total Bilirubin: 0.5 mg/dL (ref 0.3–1.2)
Total Protein: 7.5 g/dL (ref 6.0–8.3)

## 2011-10-22 LAB — VITAMIN B12: Vitamin B-12: 479 pg/mL (ref 211–911)

## 2011-10-22 LAB — SEDIMENTATION RATE: Sed Rate: 19 mm/hr (ref 0–22)

## 2011-10-22 LAB — HEMOGLOBIN A1C: Hgb A1c MFr Bld: 5.1 % (ref 4.6–6.5)

## 2011-10-22 LAB — TSH: TSH: 0.86 u[IU]/mL (ref 0.35–5.50)

## 2011-10-22 NOTE — Patient Instructions (Signed)
Go to the basement to have your labs drawn today.  Your appointment for the nerve conduction studies/electromyelogram is scheduled for Monday, Feb. 11th at 8:30am at [facility] [address] [place], [place] [phone].

## 2011-10-22 NOTE — Progress Notes (Signed)
Dear Dr. Caryl Never,  Thank you for having me see Diane [name] in consultation today at [facility] for her problem with paroxysmal sensory changes and dizziness.  As you may recall, she is a 31 y.o. year old female with a history of Chrohn's disease and migraine headaches who presents with problems with sensory changes involving her arm and leg(usually not at the same time.)  She describes this as numbness of her left foot or numbness or tingling of her left arm.  it is not provoked by anything and only lasts minutes.  It never involves the face, it is not accompanied by loss of consciousness.  There is no headache accompanying it.  There is no weakness.  She also is having frequent spells of "dizziness" that she describes as lightheadness, when she is both lying and sitting that is worse during her period.  Like her spells of numbness these only last minutes.  She has not lost consicousness.  Her vision is not affected.  These are not accompanied by headaches.  Past Medical History  Diagnosis Date  . Crohn's disease   . Pneumonia   - Chrohn's in remission.  No past surgical history on file.  History   Social History  . Marital Status: Legally Separated    Spouse Name: N/A    Number of Children: N/A  . Years of Education: N/A   Social History Main Topics  . Smoking status: Never Smoker   . Smokeless tobacco: Never Used  . Alcohol Use: No  . Drug Use: No  . Sexually Active: None   Other Topics Concern  . None   Social History Narrative  . None    Family History  Problem Relation Age of Onset  . Heart disease Mother 61    CHF  - no history of migraines, although mom has "sinus headaches".  Current Outpatient Prescriptions on File Prior to Visit  Medication Sig Dispense Refill  . cetirizine (ZYRTEC) 10 MG chewable tablet Chew 10 mg by mouth daily as needed.        . triamterene-hydrochlorothiazide (MAXZIDE-25) 37.5-25 MG per tablet Take 1 each (1 tablet total) by  mouth daily.  30 tablet  5    No Known Allergies    ROS:  13 systems were reviewed and are notable for patient has swelling of the leg, that was worked up for DVT, etc and was negative.  All other review of systems are unremarkable.   Examination:  Filed Vitals:   10/22/11 1106  BP: 118/80  Pulse: 88  Height: 5' 6.25" (1.683 m)  Weight: 179 lb (81.194 kg)     In general, well appearing young women.  Cardiovascular: The patient has a regular rate and rhythm and no carotid bruits.  Fundoscopy:  Disks are flat. Vessel caliber within normal limits.  Mental status:   The patient is oriented to person, place and time. Recent and remote memory are intact. Attention span and concentration are normal. Language including repetition, naming, following commands are intact. Fund of knowledge of current and historical events, as well as vocabulary are normal.  Cranial Nerves: Pupils are equally round and reactive to light. Visual fields full to confrontation. Extraocular movements are intact without nystagmus. Facial sensation and muscles of mastication are intact. Muscles of facial expression are symmetric. Hearing intact to bilateral finger rub. Tongue protrusion, uvula, palate midline.  Shoulder shrug intact  Motor:  The patient has normal bulk and tone, no pronator drift.  There are no  adventitious movements.  5/5  Reflexes:   Biceps  Triceps Brachioradialis Knee Ankle  Right 2+  2+  2+   2+ 1+  Left  2+  2+  2+   2+ 1+  Toes down  Coordination:  Normal finger to nose.  No dysdiadokinesia.  Sensation is decreased to vibration and temperature in a length dep fashion in upper and lower extremities.  Gait and Station are normal.  Tandem gait is intact.  Romberg is negative   Impression/Recs: Pseudo stereotyped spells of sensory changes lasting minutes that involve either arm or leg.  Also, spells of worsening lightheadedness that seem to be worse during her period.  I think  these are likely benign phenomenon, likely related to her history of migraines.  However, he length dependent sensory loss is slightly strange.  Because of her history of Chrohns one wonders about mononeuritis multiplex causing her sensory changes(but this would be very unusual given its transient nature).  I think seizure would be unusual as well.  Given her finding on exam I am going to get an EMG/NCS of her LUE and LLE to look for peripheral neuropathy/radiculopathy.  I am also going to get PN screening labs as well as extended inflammatory markers.  If these studies are unremarkable, I would likely consider a trial of Elavil as a preventative for migraines with the assumption that these are a migrainous phemonenon.  We will see the patient back in 2 months.  Thank you for having [place] see Diane [name] in consultation.  Feel free to contact me with any questions.  Lupita Raider Modesto Charon, MD [facility], [place] [address] [place], [place] [address] Phone: [phone] Fax: [phone].

## 2011-10-23 LAB — RHEUMATOID FACTOR: Rhuematoid fact SerPl-aCnc: <10 IU/mL (ref <=14)

## 2011-10-23 LAB — C-REACTIVE PROTEIN: CRP: 0.14 mg/dL (ref <0.60)

## 2011-10-23 LAB — ANA: Anti Nuclear Antibody(ANA): NEGATIVE

## 2011-10-24 LAB — SPEP & IFE WITH QIG
Albumin ELP: 55.3 % — ABNORMAL LOW (ref 55.8–66.1)
Alpha-1-Globulin: 4.6 % (ref 2.9–4.9)
Alpha-2-Globulin: 10.9 % (ref 7.1–11.8)
Beta 2: 5.3 % (ref 3.2–6.5)
Beta Globulin: 6.4 % (ref 4.7–7.2)
Gamma Globulin: 17.5 % (ref 11.1–18.8)
IgA: 209 mg/dL (ref 69–380)
IgG (Immunoglobin G), Serum: 1270 mg/dL (ref 690–1700)
IgM, Serum: 137 mg/dL (ref 52–322)
Total Protein, Serum Electrophoresis: 6.8 g/dL (ref 6.0–8.3)

## 2011-10-28 LAB — CRYOGLOBULIN

## 2011-10-28 LAB — METHYLMALONIC ACID, SERUM: Methylmalonic Acid, Quant: 0.13 umol/L (ref <0.40)

## 2011-11-06 ENCOUNTER — Telehealth: Payer: Self-pay | Admitting: Neurology

## 2011-11-06 NOTE — Telephone Encounter (Signed)
Message copied by Benay Spice on Tue Nov 06, 2011 10:19 AM ------      Message from: Milas Gain      Created: Tue Nov 06, 2011 12:23 AM       Apologize for the delay but let Ms. Alphin know her labs looked ok except for a mildly low potassium 3.3 .  Should not be anything to worry about as she typically runs low.  She should probably follow up with her family doc in the next month or so about it.

## 2011-11-06 NOTE — Telephone Encounter (Signed)
Patient returned my call. Information given as per Dr. Modesto Charon below. Recommendation given to f/u with her PCP in a month or two re: potassium. No additional concerns voiced at this time.

## 2011-11-12 ENCOUNTER — Telehealth: Payer: Self-pay | Admitting: Neurology

## 2011-11-12 NOTE — Telephone Encounter (Signed)
Message copied by Angelica Pou on Mon Nov 12, 2011  5:40 PM ------      Message from: Clearnce Sorrel      Created: Mon Nov 12, 2011  3:49 PM       please let Ms. Deol know her NCS/EMG was normal.

## 2011-11-12 NOTE — Telephone Encounter (Signed)
Called and spoke with the patient re: normal NCV/EMG. The patient voiced no additional concerns at this time.

## 2011-11-15 ENCOUNTER — Encounter: Payer: Self-pay | Admitting: Neurology

## 2011-12-20 ENCOUNTER — Ambulatory Visit (INDEPENDENT_AMBULATORY_CARE_PROVIDER_SITE_OTHER): Payer: BC Managed Care – PPO | Admitting: Neurology

## 2011-12-20 ENCOUNTER — Encounter: Payer: Self-pay | Admitting: Neurology

## 2011-12-20 VITALS — BP 122/80 | HR 80 | Wt 177.0 lb

## 2011-12-20 DIAGNOSIS — G35 Multiple sclerosis: Secondary | ICD-10-CM

## 2011-12-20 NOTE — Progress Notes (Signed)
  Dear Dr. Elease Hashimoto,  I saw  Elease Hashimoto back in [facility] for her problem with transient parathesias of her left hemibody.  As you may recall, she is a 31 y.o. year old female with a history of Crohn's disease who has had frequent transient parathesias of the left hemibody, sometimes arm, sometimes, leg, but always sparing face.  At her last visit I noted a length dependent sensory loss in all extremities and got an EMG/NCS which was normal.  PN labs were normal.  She returns saying that she continues to have the difficulty with the transient numbness and tingling of the left arm and leg.  It is unprovoked.  She now notes that sometimes she gets a throbbing feeling in her head, usually on the left that accompanies the sensations.  They last seconds to minutes.  She had a spell several days ago when she had shooting pain down her back accompanied by the left arm and leg numbness that lasted a couple of days.  It was accompanied by neck pain and tingling down her spine.   Medical history, social history, and family history were reviewed and have not changed since the last clinic visit.  Current Outpatient Prescriptions on File Prior to Visit  Medication Sig Dispense Refill  . cetirizine (ZYRTEC) 10 MG chewable tablet Chew 10 mg by mouth daily as needed.        . triamterene-hydrochlorothiazide (MAXZIDE-25) 37.5-25 MG per tablet Take 1 each (1 tablet total) by mouth daily.  30 tablet  5    No Known Allergies  ROS:  13 systems were reviewed and are notable for swelling in hands and feet, nausea, abdominal pain, headaches.  All other review of systems are unremarkable.  Exam: . Filed Vitals:   12/20/11 1003  BP: 122/80  Pulse: 80  Weight: 177 lb (80.287 kg)    In general, well appearing women.  Mental status:   The patient is oriented to person, place and time. Recent and remote memory are intact. Attention span and concentration are normal. Language including repetition,  naming, following commands are intact. Fund of knowledge of current and historical events, as well as vocabulary are normal.  Cranial Nerves: Pupils are equally round and reactive to light. Visual fields full to confrontation. Extraocular movements are intact without nystagmus. Facial sensation and muscles of mastication are intact. Muscles of facial expression are symmetric. Hearing intact to bilateral finger rub. Tongue protrusion, uvula, palate midline.  Shoulder shrug intact  Motor:  Normal bulk and tone, no drift and 5/5 muscle strength bilaterally.  Reflexes:  2+ thoughout, except absent ankles, toes down.  Coordination:  Normal finger to nose.  Sensory testing in the upper extremities to temperature was normal.  Gait:  Normal gait and station.  Romberg negative.  Impression/Recommendations:  1.  Transient sensory changes of left hemibody - I suspect that these are likely related to her headaches.  However, given the spell of what sounds like LHermitte's phenomenon I am going to get an MRI brain and C-spine to look for demyelination.  If this is negative we will consider the use of Elavil for these transient spells. 2.  Spells of dizziness - worse during her period.  I did not ask about these today, but again feel they may be a migrainous phenomenon.  We will see the patient back in 6 weeks.  Kavin Leech Jacelyn Grip, MD [facility], [place]

## 2011-12-20 NOTE — Patient Instructions (Signed)
Your MRI is scheduled for Tuesday, April 2nd at 12:00noon.   Please arrive to [facility] by 11:45am.  [phone].

## 2011-12-25 ENCOUNTER — Ambulatory Visit (HOSPITAL_COMMUNITY)
Admission: RE | Admit: 2011-12-25 | Discharge: 2011-12-25 | Disposition: A | Discharge status: Home / Self Care | Payer: BC Managed Care – PPO | Source: Ambulatory Visit | Attending: Neurology | Admitting: Neurology

## 2011-12-25 DIAGNOSIS — G35 Multiple sclerosis: Secondary | ICD-10-CM

## 2011-12-25 DIAGNOSIS — M5020 Other cervical disc displacement, unspecified cervical region: Secondary | ICD-10-CM | POA: Insufficient documentation

## 2011-12-25 DIAGNOSIS — R209 Unspecified disturbances of skin sensation: Secondary | ICD-10-CM | POA: Insufficient documentation

## 2011-12-25 MED ORDER — GADOBENATE DIMEGLUMINE 529 MG/ML IV SOLN
16.0000 mL | Freq: Once | INTRAVENOUS | Status: AC | PRN
  Administered 2011-12-25: 16 mL via INTRAVENOUS

## 2012-01-01 ENCOUNTER — Telehealth: Payer: Self-pay | Admitting: Neurology

## 2012-01-01 NOTE — Telephone Encounter (Signed)
Pt called for results of MRI on 12/25/2011.

## 2012-01-03 NOTE — Telephone Encounter (Signed)
Jan - Could you call Diane [name] and tell her that her MRI brain and spine looked ok.  Minor disk bulge in spine but nothing that I think explains her symptoms.

## 2012-01-03 NOTE — Telephone Encounter (Signed)
Called and spoke with the patient. Information given as per Dr. Jacelyn Grip below re: MRI results. No questions or concerns voiced at this time. Is aware of her f/u appointment in May.

## 2012-02-01 ENCOUNTER — Ambulatory Visit: Payer: BC Managed Care – PPO | Admitting: Neurology

## 2012-09-24 HISTORY — PX: ADENOIDECTOMY: SUR15

## 2012-09-24 HISTORY — PX: TONSILLECTOMY: SUR1361

## 2012-09-24 HISTORY — PX: TYMPANOSTOMY TUBE PLACEMENT: SHX32

## 2012-10-09 ENCOUNTER — Telehealth: Payer: Self-pay | Admitting: *Deleted

## 2012-10-09 NOTE — Telephone Encounter (Signed)
Message copied by Melchor Amour on Thu Oct 09, 2012  4:26 PM ------      Message from: Kristian Covey      Created: Mon Jul 28, 2012  8:09 AM       This pt needs follow up K- BMP      ----- Message -----         From: SYSTEM         Sent: 07/28/2012  12:02 AM           To: Kristian Covey, MD

## 2012-10-09 NOTE — Telephone Encounter (Signed)
I left a message on home VM asking pt to call for OV with Dr Caryl Never.  Dr Modesto Charon made a note on 11-06-11 that pt should probably follow up with her PCP next month or so.  I just noted this message today.

## 2012-10-09 NOTE — Telephone Encounter (Signed)
Message copied by Melchor Amour on Thu Oct 09, 2012  4:41 PM ------      Message from: Kristian Covey      Created: Mon Jul 28, 2012  8:09 AM       This pt needs follow up K- BMP      ----- Message -----         From: SYSTEM         Sent: 07/28/2012  12:02 AM           To: Kristian Covey, MD

## 2012-10-10 NOTE — Progress Notes (Signed)
LMTCB re: return OV.  Pt will schedule

## 2012-10-13 ENCOUNTER — Ambulatory Visit (INDEPENDENT_AMBULATORY_CARE_PROVIDER_SITE_OTHER): Payer: 59 | Admitting: Family Medicine

## 2012-10-13 ENCOUNTER — Encounter: Payer: Self-pay | Admitting: Family Medicine

## 2012-10-13 VITALS — BP 132/90 | Temp 99.0°F | Wt 183.0 lb

## 2012-10-13 DIAGNOSIS — IMO0001 Reserved for inherently not codable concepts without codable children: Secondary | ICD-10-CM

## 2012-10-13 DIAGNOSIS — E876 Hypokalemia: Secondary | ICD-10-CM

## 2012-10-13 DIAGNOSIS — H9201 Otalgia, right ear: Secondary | ICD-10-CM

## 2012-10-13 DIAGNOSIS — H9209 Otalgia, unspecified ear: Secondary | ICD-10-CM

## 2012-10-13 DIAGNOSIS — R030 Elevated blood-pressure reading, without diagnosis of hypertension: Secondary | ICD-10-CM

## 2012-10-13 NOTE — Progress Notes (Signed)
  Subjective:    Patient ID: Diane [name], female    DOB: 08/12/1981, 32 y.o.   MRN: [id number]  HPI Patient seen for medical followup. Prior history of migraine headaches. Doing much better with job change. Less stress. Also careful with diet. Patient has some left hemisensory changes and saw a neurologist last year. MRI brain and cervical spine no significant abnormalities. Symptoms have fully resolve. She watches caffeine intake closely.  Recent right otalgia. Duration of symptoms 3-4 days. No dizziness. No significant nasal congestion. No hearing changes. No drainage. No fever.  Elevated blood pressure today borderline elevation no headaches. Not currently exercising. Increased sodium intake. No alcohol use. No nonsteroidal use.  Prior history of hypokalemia. No diuretic use.  Past Medical History  Diagnosis Date  . Crohn's disease   . Pneumonia    No past surgical history on file.  reports that she has never smoked. She has never used smokeless tobacco. She reports that she does not drink alcohol or use illicit drugs. family history includes Heart disease (age of onset:45) in her mother. No Known Allergies    Review of Systems  Constitutional: Negative for fatigue.  Eyes: Negative for visual disturbance.  Respiratory: Negative for cough, chest tightness, shortness of breath and wheezing.   Cardiovascular: Negative for chest pain, palpitations and leg swelling.  Neurological: Negative for dizziness, seizures, syncope, weakness, light-headedness and headaches.       Objective:   Physical Exam  Constitutional: She appears well-developed and well-nourished. No distress.  HENT:  Right Ear: External ear normal.  Left Ear: External ear normal.  Mouth/Throat: Oropharynx is clear and moist.  Neck: Neck supple. No thyromegaly present.  Cardiovascular: Normal rate and regular rhythm.   Pulmonary/Chest: Effort normal and breath sounds normal. No respiratory distress. She has no  wheezes. She has no rales.  Musculoskeletal: She exhibits no edema.          Assessment & Plan:  #1 elevated blood pressure. Monitor closely. Work on weight loss. Establish more consistent aerobic exercise. Reassess 6 months #2 history of hypokalemia. Recheck basic metabolic panel. Patient request doing this to her employer which is [employer]  #3 history of migraine headaches. Stable #4 right otalgia. Nonfocal exam. Reassurance

## 2012-10-13 NOTE — Patient Instructions (Addendum)

## 2012-10-15 ENCOUNTER — Telehealth: Payer: Self-pay | Admitting: *Deleted

## 2012-10-15 NOTE — Telephone Encounter (Signed)
Mild low K (3.4) increase K rich foods.

## 2012-10-15 NOTE — Telephone Encounter (Signed)
Pt works at [employer] and had BMP done on 10-13-12 Labs will be scanned to pt chart

## 2012-10-15 NOTE — Telephone Encounter (Signed)
Pt informed

## 2012-10-23 ENCOUNTER — Encounter: Payer: Self-pay | Admitting: Family Medicine

## 2012-12-08 ENCOUNTER — Telehealth: Payer: Self-pay | Admitting: Family Medicine

## 2012-12-08 ENCOUNTER — Emergency Department (HOSPITAL_COMMUNITY)
Admission: EM | Admit: 2012-12-08 | Discharge: 2012-12-08 | Disposition: A | Discharge status: Home / Self Care | Payer: 59 | Attending: Emergency Medicine | Admitting: Emergency Medicine

## 2012-12-08 DIAGNOSIS — R51 Headache: Secondary | ICD-10-CM | POA: Insufficient documentation

## 2012-12-08 DIAGNOSIS — Z8719 Personal history of other diseases of the digestive system: Secondary | ICD-10-CM | POA: Insufficient documentation

## 2012-12-08 DIAGNOSIS — R590 Localized enlarged lymph nodes: Secondary | ICD-10-CM

## 2012-12-08 DIAGNOSIS — R110 Nausea: Secondary | ICD-10-CM | POA: Insufficient documentation

## 2012-12-08 DIAGNOSIS — Z79899 Other long term (current) drug therapy: Secondary | ICD-10-CM | POA: Insufficient documentation

## 2012-12-08 DIAGNOSIS — Z8701 Personal history of pneumonia (recurrent): Secondary | ICD-10-CM | POA: Insufficient documentation

## 2012-12-08 DIAGNOSIS — R599 Enlarged lymph nodes, unspecified: Secondary | ICD-10-CM | POA: Insufficient documentation

## 2012-12-08 LAB — RAPID STREP SCREEN (MED CTR MEBANE ONLY): Streptococcus, Group A Screen (Direct): NEGATIVE

## 2012-12-08 MED ORDER — CEPHALEXIN 500 MG PO CAPS: 500.0000 mg | ORAL_CAPSULE | Freq: Four times a day (QID) | ORAL | Status: DC

## 2012-12-08 MED ORDER — FLUCONAZOLE 150 MG PO TABS: 150.0000 mg | ORAL_TABLET | Freq: Once | ORAL | Status: DC

## 2012-12-08 MED ORDER — HYDROCODONE-ACETAMINOPHEN 7.5-325 MG/15ML PO SOLN
10.0000 mL | Freq: Once | ORAL | Status: AC
  Administered 2012-12-08: 10 mL via ORAL
  Filled 2012-12-08: qty 15

## 2012-12-08 MED ORDER — HYDROCODONE-ACETAMINOPHEN 7.5-325 MG/15ML PO SOLN: 10.0000 mL | Freq: Four times a day (QID) | ORAL | Status: DC | PRN

## 2012-12-08 NOTE — Telephone Encounter (Signed)
Patient had called office and left phone number to be called back concerning swollen gland in throat. Attempted call back twice and phone not answered, left message for patient to call us back.

## 2012-12-08 NOTE — ED Provider Notes (Signed)
History    This chart was scribed for non-physician practitioner Elpidio Anis, PA-C working with Gilda Crease, MD by Gerlean Ren, ED Scribe. This patient was seen in room WTR9/WTR9 and the patient's care was started at 3:26 PM.    CSN: [id number]  Arrival date & time 12/08/12  1441   First MD Initiated Contact with Patient 12/08/12 1517      Chief Complaint  Patient presents with  . Adenopathy    The history is provided by the patient. No language interpreter was used.  Diane [name] is a 32 y.o. female who presents to the Emergency Department complaining of constant right side neck tenderness first noticed yesterday that is worsened by swallowing and tender to touch.  Pt denies sore throat, cough, rhinorrhea, congestion, emesis, diarrhea, fever.  Mild HA and mild nausea but pt states she has been able to eat small amounts of food.  Pt reports she used 600mg  Motrin this morning with some relief to pain.  No chronic medical conditions and no h/o strep throat.  No regular medications.  No known allergies.  Pt reports her son at home has been sick with cough.   Past Medical History  Diagnosis Date  . Crohn's disease   . Pneumonia     No past surgical history on file.  Family History  Problem Relation Age of Onset  . Heart disease Mother 10    CHF    History  Substance Use Topics  . Smoking status: Never Smoker   . Smokeless tobacco: Never Used  . Alcohol Use: No    No OB history provided.   Review of Systems  Constitutional: Negative for fever.  HENT: Negative for congestion, sore throat and rhinorrhea.        Neck tenderness  Respiratory: Negative for cough.   Gastrointestinal: Positive for nausea (mild). Negative for vomiting and diarrhea.  Skin: Negative for rash.  Neurological: Positive for headaches (mild).  All other systems reviewed and are negative.    Allergies  Review of patient's allergies indicates no known allergies.  Home Medications    Current Outpatient Rx  Name  Route  Sig  Dispense  Refill  . cetirizine (ZYRTEC) 10 MG chewable tablet   Oral   Chew 10 mg by mouth daily as needed.           Marland Kitchen EXPIRED: triamterene-hydrochlorothiazide (MAXZIDE-25) 37.5-25 MG per tablet   Oral   Take 1 each (1 tablet total) by mouth daily.   30 tablet   5     BP 139/95  Pulse 99  Temp(Src) 100.2 F (37.9 C) (Oral)  SpO2 100%  Physical Exam  Nursing note and vitals reviewed. Constitutional: She is oriented to person, place, and time. She appears well-developed and well-nourished. No distress.  HENT:  Head: Normocephalic and atraumatic.  Bilateral TM normal.  Posterior pharynx mildly erythematous with minimal swelling.  Eyes: EOM are normal.  Neck: Neck supple. No tracheal deviation present.  Bilateral anterior cervical adenopathy greater and more tender on the right.  Cardiovascular: Normal rate.   Pulmonary/Chest: Effort normal. No respiratory distress.  Musculoskeletal: Normal range of motion.  Neurological: She is alert and oriented to person, place, and time.  Skin: Skin is warm and dry.  Psychiatric: She has a normal mood and affect. Her behavior is normal.    ED Course  Procedures (including critical care time) DIAGNOSTIC STUDIES: Oxygen Saturation is 100% on room air, normal by my interpretation.  COORDINATION OF CARE: 3:30 PM- Patient informed of clinical course including pain relief and strep swab.  Pt understands medical decision-making process and agrees with plan.  Results for orders placed during the hospital encounter of 12/08/12  RAPID STREP SCREEN      Result Value Range   Streptococcus, Group A Screen (Direct) NEGATIVE  NEGATIVE      No diagnosis found.  1. Lymphadenopathy  2. Febrile illness  MDM  Strep negative. Patient has febrile illness with tender adenopathy. Appears ill but not toxic. Will treat with Keflex, pain medication, supportive measures.   I personally performed the  services described in this documentation, which was scribed in my presence. The recorded information has been reviewed and is accurate.          Arnoldo Hooker, PA-C 12/08/12 1640

## 2012-12-08 NOTE — ED Notes (Signed)
Pt has some swelling to R side of neck since yesterday. Pt c/o pain with moving neck and swallowing. Pt denies difficulty swallowing. Pt states she has a headache and slight nausea. Pt denies any other symptoms. States she had some allergies last week, but this is normal for her this time of year. Pt states she took Motrin 600mg  this morning with some relief. Pt has no acute distress. Speaks in clear sentences.  Pt has a ride home.

## 2012-12-08 NOTE — ED Provider Notes (Signed)
Medical screening examination/treatment/procedure(s) were performed by non-physician practitioner and as supervising physician I was immediately available for consultation/collaboration.  Gilda Crease, MD 12/08/12 [phone]

## 2012-12-08 NOTE — Telephone Encounter (Signed)
Patient Information:  Caller Name: [name]  Phone: [phone]  Patient: Diane [name]  Gender: Female  DOB: 1981/04/17  Age: 32 Years  PCP: Carolann Littler (Family Practice)  Pregnant: No  Office Follow Up:  Does the office need to follow up with this patient?: No  Instructions For The Office: N/A   Symptoms  Reason For Call & Symptoms: swollen gland on neck started yesterday 3/16, now has 2-3 more on neck, hurts to swallow, neck tender to touch.  Not eating or drinking well due to pain in throat when swallows.  Is currently in the ER since 3 pm - has seen MD, is waiting for Strep Screen currently.   Reviewed Health History In EMR: No  Reviewed Medications In EMR: No  Reviewed Allergies In EMR: No  Reviewed Surgeries / Procedures: No  Date of Onset of Symptoms: 12/07/2012 OB / GYN:  LMP: Unknown  Guideline(s) Used:  No Protocol Available - Sick Adult  No Protocol Available - Information Only  Disposition Per Guideline:   Home Care  Reason For Disposition Reached:   Information only question and nurse able to answer  Advice Given:  N/A  Patient Refused Recommendation:  Patient Will Go To [facility]  Patient currently in ER, evaluation has started, awaiting strep screen currently

## 2012-12-19 ENCOUNTER — Encounter: Payer: Self-pay | Admitting: Family Medicine

## 2012-12-19 ENCOUNTER — Ambulatory Visit (INDEPENDENT_AMBULATORY_CARE_PROVIDER_SITE_OTHER): Payer: 59 | Admitting: Family Medicine

## 2012-12-19 ENCOUNTER — Telehealth: Payer: Self-pay | Admitting: Family Medicine

## 2012-12-19 VITALS — BP 132/88 | Temp 97.6°F

## 2012-12-19 DIAGNOSIS — J0110 Acute frontal sinusitis, unspecified: Secondary | ICD-10-CM

## 2012-12-19 DIAGNOSIS — R599 Enlarged lymph nodes, unspecified: Secondary | ICD-10-CM

## 2012-12-19 DIAGNOSIS — R590 Localized enlarged lymph nodes: Secondary | ICD-10-CM

## 2012-12-19 MED ORDER — AMOXICILLIN-POT CLAVULANATE 875-125 MG PO TABS: 1.0000 | ORAL_TABLET | Freq: Two times a day (BID) | ORAL | Status: DC

## 2012-12-19 MED ORDER — FLUCONAZOLE 150 MG PO TABS: 150.0000 mg | ORAL_TABLET | Freq: Once | ORAL | Status: DC

## 2012-12-19 NOTE — Telephone Encounter (Signed)
Patient Information:  Caller Name: Holland Falling  Phone: [phone]  Patient: Diane [name]  Gender: Female  DOB: [birth year]  Age: 32 Years  PCP: Carolann Littler [facility])  Pregnant: No  Office Follow Up:  Does the office need to follow up with this patient?: No  Instructions For The Office: N/A  RN Note:  pt reports she has 2 swollen glands.  Pt was requesting another antibiotic.  Rn advised patient that she would need to be seen before antibiotic could be prescribed.   Symptoms  Reason For Call & Symptoms: 12/08/12 patient was seen in the ER for 5 swollen knot on her neck.  Pt was given an antibiotic but symptoms never went away.  [facility]  for swollen tonsils and congestion.  Pt states that her strep test was negative.  Pt was advised to take OTC medications.  Pt reports on the other side of neck her lymph nodes are swollen and tender  Reviewed Health History In EMR: Yes  Reviewed Medications In EMR: Yes  Reviewed Allergies In EMR: Yes  Reviewed Surgeries / Procedures: Yes  Date of Onset of Symptoms: 12/07/2012  Treatments Tried: Sudafed, Saline spray, Ibuprofen 652m.  Treatments Tried Worked: No OB / GYN:  LMP: 11/26/2012  Guideline(s) Used:  Lymph Nodes - Swollen  Disposition Per Guideline:   See Today or Tomorrow in Office  Reason For Disposition Reached:   Very tender to the touch but no fever  Advice Given:  Call Back If:  Node enlarges to more than 1 inch (2.5 cm) in size  Fever more than 103 F (39.4 C) occurs  Patient Will Follow Care Advice:  YES  Appointment Scheduled:  12/19/2012 13:15:00 Appointment Scheduled Provider:  BCarolann Littler(Family Practice)

## 2012-12-19 NOTE — Patient Instructions (Signed)
Lymphadenopathy Lymphadenopathy means "disease of the lymph glands." But the term is usually used to describe swollen or enlarged lymph glands, also called lymph nodes. These are the bean-shaped organs found in many locations including the neck, underarm, and groin. Lymph glands are part of the immune system, which fights infections in your body. Lymphadenopathy can occur in just one area of the body, such as the neck, or it can be generalized, with lymph node enlargement in several areas. The nodes found in the neck are the most common sites of lymphadenopathy. CAUSES  When your immune system responds to germs (such as viruses or bacteria ), infection-fighting cells and fluid build up. This causes the glands to grow in size. This is usually not something to worry about. Sometimes, the glands themselves can become infected and inflamed. This is called lymphadenitis. Enlarged lymph nodes can be caused by many diseases:  Bacterial disease, such as strep throat or a skin infection.  Viral disease, such as a common cold.  Other germs, such as lyme disease, tuberculosis, or sexually transmitted diseases.  Cancers, such as lymphoma (cancer of the lymphatic system) or leukemia (cancer of the white blood cells).  Inflammatory diseases such as lupus or rheumatoid arthritis.  Reactions to medications. Many of the diseases above are rare, but important. This is why you should see your caregiver if you have lymphadenopathy. SYMPTOMS   Swollen, enlarged lumps in the neck, back of the head or other locations.  Tenderness.  Warmth or redness of the skin over the lymph nodes.  Fever. DIAGNOSIS  Enlarged lymph nodes are often near the source of infection. They can help healthcare providers diagnose your illness. For instance:   Swollen lymph nodes around the jaw might be caused by an infection in the mouth.  Enlarged glands in the neck often signal a throat infection.  Lymph nodes that are swollen  in more than one area often indicate an illness caused by a virus. Your caregiver most likely will know what is causing your lymphadenopathy after listening to your history and examining you. Blood tests, x-rays or other tests may be needed. If the cause of the enlarged lymph node cannot be found, and it does not go away by itself, then a biopsy may be needed. Your caregiver will discuss this with you. TREATMENT  Treatment for your enlarged lymph nodes will depend on the cause. Many times the nodes will shrink to normal size by themselves, with no treatment. Antibiotics or other medicines may be needed for infection. Only take over-the-counter or prescription medicines for pain, discomfort or fever as directed by your caregiver. HOME CARE INSTRUCTIONS  Swollen lymph glands usually return to normal when the underlying medical condition goes away. If they persist, contact your health-care provider. He/she might prescribe antibiotics or other treatments, depending on the diagnosis. Take any medications exactly as prescribed. Keep any follow-up appointments made to check on the condition of your enlarged nodes.  SEEK MEDICAL CARE IF:   Swelling lasts for more than two weeks.  You have symptoms such as weight loss, night sweats, fatigue or fever that does not go away.  The lymph nodes are hard, seem fixed to the skin or are growing rapidly.  Skin over the lymph nodes is red and inflamed. This could mean there is an infection. SEEK IMMEDIATE MEDICAL CARE IF:   Fluid starts leaking from the area of the enlarged lymph node.  You develop a fever of 102 F (38.9 C) or greater.  Severe  pain develops (not necessarily at the site of a large lymph node).  You develop chest pain or shortness of breath.  You develop worsening abdominal pain. MAKE SURE YOU:   Understand these instructions.  Will watch your condition.  Will get help right away if you are not doing well or get worse. Document  Released: 06/19/2008 Document Revised: 12/03/2011 Document Reviewed: 06/19/2008 [facility] Patient Information 2013 [place].  Netti pot for nasal irrigation.

## 2012-12-19 NOTE — Progress Notes (Signed)
  Subjective:    Patient ID: Diane [name], female    DOB: 27-Sep-1980, 32 y.o.   MRN: [id number]  HPI Acute visit Patient was seen recently emergency room lymphadenopathy right side of neck. This occurred on 12/08/2012. Rapid strep negative. Patient placed on Keflex and ibuprofen and lymph nodes improved but did not resolve. [facility] again rapid strep negative. Patient now has some sinus congestion and progressive right frontal sinus pressure. Blood tinged and yellow mucus intermittently. No fever. Sudafed without relief.  Using saline nasal mist. Denies weight loss. No appetite changes. Now has some tender left adenopathy. No other lymph nodes noted. No recent night sweats. No cough. No dyspnea. Initially had some sore throat which is now resolved   Review of Systems  Constitutional: Negative for fever and chills.  HENT: Positive for congestion and sinus pressure. Negative for sore throat.   Respiratory: Negative for cough and shortness of breath.   Gastrointestinal: Negative for nausea and vomiting.  Neurological: Positive for headaches.  Hematological: Positive for adenopathy.       Objective:   Physical Exam  Constitutional: She appears well-developed and well-nourished.  HENT:  Right Ear: External ear normal.  Left Ear: External ear normal.  Mouth/Throat: Oropharynx is clear and moist.  Neck: Neck supple.  Minimal bilateral anterior cervical adenopathy which is tender to palpation. No posterior cervical nodes. No supraclavicular nodes. No axillary adenopathy  Cardiovascular: Normal rate and regular rhythm.   No murmur heard. Pulmonary/Chest: Effort normal and breath sounds normal. No respiratory distress. She has no wheezes. She has no rales.  Lymphadenopathy:    She has cervical adenopathy.          Assessment & Plan:  Anterior cervical lymphadenopathy. Suspect acute reactive lymphadenopathy. She has localized right frontal sinus  pressure. Given duration of symptoms start Augmentin 875 mg twice daily for 10 days. Continue Sudafed. Try saline nasal irrigation. Touch base 2 weeks if not fully resolving

## 2012-12-22 ENCOUNTER — Telehealth: Payer: Self-pay | Admitting: Family Medicine

## 2012-12-22 ENCOUNTER — Encounter: Payer: Self-pay | Admitting: Family Medicine

## 2012-12-22 ENCOUNTER — Ambulatory Visit (INDEPENDENT_AMBULATORY_CARE_PROVIDER_SITE_OTHER): Payer: 59 | Admitting: Family Medicine

## 2012-12-22 VITALS — BP 130/90 | Temp 98.9°F | Wt 194.0 lb

## 2012-12-22 DIAGNOSIS — J029 Acute pharyngitis, unspecified: Secondary | ICD-10-CM

## 2012-12-22 DIAGNOSIS — R599 Enlarged lymph nodes, unspecified: Secondary | ICD-10-CM

## 2012-12-22 DIAGNOSIS — R590 Localized enlarged lymph nodes: Secondary | ICD-10-CM

## 2012-12-22 LAB — POCT MONO (EPSTEIN BARR VIRUS): Mono, POC: NEGATIVE

## 2012-12-22 MED ORDER — NAPROXEN 500 MG PO TABS: 500.0000 mg | ORAL_TABLET | Freq: Two times a day (BID) | ORAL | Status: DC

## 2012-12-22 NOTE — Progress Notes (Signed)
  Subjective:    Patient ID: Diane [name], female    DOB: [birth year], 32 y.o.   MRN: [id number]  HPI Patient presents with persistent cervical adenopathy right greater than left and pharyngitis symptoms Refer to prior note. She had 2 separate tests for group A strep and both were negative. She's taken ibuprofen without much improvement. She's had some recurrent sweats and chills but no definite fever. Some increase fatigue. She's not seen any exudate. No significant cough. No significant nasal congestion.  Started recently on Augmentin for presumed sinusitis symptoms. No rash. Denies any nausea or vomiting.   Review of Systems  Constitutional: Positive for chills.  HENT: Positive for sore throat. Negative for congestion and voice change.   Respiratory: Negative for cough.   Gastrointestinal: Negative for abdominal pain.  Musculoskeletal: Negative for arthralgias.  Skin: Negative for rash.  Hematological: Positive for adenopathy ( cervical only).       Objective:   Physical Exam  Constitutional: She appears well-developed and well-nourished.  HENT:  Right Ear: External ear normal.  Left Ear: External ear normal.  Minimal erythema posterior pharynx. Minimal swelling uvula. No exudate. No soft palate asymmetry to suggest peritonsillar abscess  Neck: Neck supple.  Patient has tender anterior cervical adenopathy right greater than left. No posterior cervical nodes.  Cardiovascular: Normal rate and regular rhythm.   Pulmonary/Chest: Effort normal and breath sounds normal. No respiratory distress. She has no wheezes. She has no rales.  Lymphadenopathy:    She has cervical adenopathy.          Assessment & Plan:  Persistent pharyngitis symptoms with anterior cervical adenopathy.  Rapid strep as above negative.  Check Monospot. Check CBC. Naproxen 500 mg twice a day when necessary for discomfort. Continue warm liquids.

## 2012-12-22 NOTE — Patient Instructions (Addendum)
Try over the counter spray such as chloroseptic spray. Continue with warm liquids for symptom relief.

## 2012-12-22 NOTE — Telephone Encounter (Signed)
Patient Information:  Caller Name: Diane [name]  Phone: [phone]  Patient: Diane Bible  Gender: Female  DOB: 02-05-1981  Age: 32 Years  PCP: Carolann Littler (Family Practice)  Pregnant: No  Office Follow Up:  Does the office need to follow up with this patient?: No  Instructions For The Office: N/A   Symptoms  Reason For Call & Symptoms: Pt is calling and states that she has an appt to be seen today at 3:45pm but wanted to know if a medication can be called in instead; pt states that she was seen in the office on 12/19/12 and Augmentin was started at this time; she is still having pain in her throat from what she thinks is her tonsil; explained that a medication will not be called in that she needs to be evaluated in the office since antibiotic is not helping; voices understanding and will comply with previously scheduled appt for today at 3:45pm  Reviewed Health History In EMR: N/A  Reviewed Medications In EMR: N/A  Reviewed Allergies In EMR: N/A  Reviewed Surgeries / Procedures: N/A  Date of Onset of Symptoms: 12/05/2012  Treatments Tried: Augmentin  Treatments Tried Worked: No OB / GYN:  LMP: Unknown  Guideline(s) Used:  No Protocol Available - Information Only  Disposition Per Guideline:   Home Care  Reason For Disposition Reached:   Information only question and nurse able to answer  Advice Given:  Call Back If:  You become worse.  Patient Will Follow Care Advice:  YES

## 2012-12-23 ENCOUNTER — Encounter (HOSPITAL_COMMUNITY): Payer: Self-pay

## 2012-12-23 ENCOUNTER — Emergency Department (HOSPITAL_COMMUNITY)
Admission: EM | Admit: 2012-12-23 | Discharge: 2012-12-23 | Disposition: A | Discharge status: Home / Self Care | Payer: 59 | Attending: Emergency Medicine | Admitting: Emergency Medicine

## 2012-12-23 ENCOUNTER — Emergency Department (HOSPITAL_COMMUNITY): Payer: 59

## 2012-12-23 ENCOUNTER — Telehealth: Payer: Self-pay | Admitting: Family Medicine

## 2012-12-23 DIAGNOSIS — Z8719 Personal history of other diseases of the digestive system: Secondary | ICD-10-CM | POA: Insufficient documentation

## 2012-12-23 DIAGNOSIS — Z79899 Other long term (current) drug therapy: Secondary | ICD-10-CM | POA: Insufficient documentation

## 2012-12-23 DIAGNOSIS — R51 Headache: Secondary | ICD-10-CM | POA: Insufficient documentation

## 2012-12-23 DIAGNOSIS — Z792 Long term (current) use of antibiotics: Secondary | ICD-10-CM | POA: Insufficient documentation

## 2012-12-23 DIAGNOSIS — R590 Localized enlarged lymph nodes: Secondary | ICD-10-CM

## 2012-12-23 DIAGNOSIS — G479 Sleep disorder, unspecified: Secondary | ICD-10-CM | POA: Insufficient documentation

## 2012-12-23 DIAGNOSIS — R1310 Dysphagia, unspecified: Secondary | ICD-10-CM | POA: Insufficient documentation

## 2012-12-23 DIAGNOSIS — R599 Enlarged lymph nodes, unspecified: Secondary | ICD-10-CM | POA: Insufficient documentation

## 2012-12-23 DIAGNOSIS — Z8701 Personal history of pneumonia (recurrent): Secondary | ICD-10-CM | POA: Insufficient documentation

## 2012-12-23 LAB — BASIC METABOLIC PANEL
BUN: 10 mg/dL (ref 6–23)
CO2: 28 mEq/L (ref 19–32)
Calcium: 8.9 mg/dL (ref 8.4–10.5)
Chloride: 98 mEq/L (ref 96–112)
Creatinine, Ser: 0.61 mg/dL (ref 0.50–1.10)
GFR calc Af Amer: >90 mL/min (ref >90)
GFR calc non Af Amer: >90 mL/min (ref >90)
Glucose, Bld: 96 mg/dL (ref 70–99)
Potassium: 2.8 mEq/L — ABNORMAL LOW (ref 3.5–5.1)
Sodium: 135 mEq/L (ref 135–145)

## 2012-12-23 LAB — CBC WITH DIFFERENTIAL/PLATELET
Basophils Absolute: 0 10*3/uL (ref 0.0–0.1)
Basophils Relative: 0 % (ref 0–1)
Eosinophils Absolute: 0 10*3/uL (ref 0.0–0.7)
Eosinophils Relative: 0 % (ref 0–5)
HCT: 35.6 % — ABNORMAL LOW (ref 36.0–46.0)
Hemoglobin: 12.2 g/dL (ref 12.0–15.0)
Lymphocytes Relative: 14 % (ref 12–46)
Lymphs Abs: 1.1 10*3/uL (ref 0.7–4.0)
MCH: 28.4 pg (ref 26.0–34.0)
MCHC: 34.3 g/dL (ref 30.0–36.0)
MCV: 83 fL (ref 78.0–100.0)
Monocytes Absolute: 0.6 10*3/uL (ref 0.1–1.0)
Monocytes Relative: 7 % (ref 3–12)
Neutro Abs: 6.5 10*3/uL (ref 1.7–7.7)
Neutrophils Relative %: 79 % — ABNORMAL HIGH (ref 43–77)
Platelets: 230 10*3/uL (ref 150–400)
RBC: 4.29 MIL/uL (ref 3.87–5.11)
RDW: 12.4 % (ref 11.5–15.5)
WBC: 8.2 10*3/uL (ref 4.0–10.5)

## 2012-12-23 MED ORDER — HYDROCODONE-ACETAMINOPHEN 5-325 MG PO TABS: 1.0000 | ORAL_TABLET | ORAL | Status: DC | PRN

## 2012-12-23 MED ORDER — CLINDAMYCIN HCL 150 MG PO CAPS: 300.0000 mg | ORAL_CAPSULE | Freq: Three times a day (TID) | ORAL | Status: DC

## 2012-12-23 MED ORDER — IOHEXOL 300 MG/ML  SOLN
100.0000 mL | Freq: Once | INTRAMUSCULAR | Status: AC | PRN
  Administered 2012-12-23: 100 mL via INTRAVENOUS

## 2012-12-23 MED ORDER — DEXAMETHASONE SODIUM PHOSPHATE 10 MG/ML IJ SOLN
10.0000 mg | Freq: Once | INTRAMUSCULAR | Status: AC
  Administered 2012-12-23: 10 mg via INTRAMUSCULAR
  Filled 2012-12-23: qty 1

## 2012-12-23 MED ORDER — FLUCONAZOLE 150 MG PO TABS: 150.0000 mg | ORAL_TABLET | Freq: Once | ORAL | Status: DC

## 2012-12-23 MED ORDER — OXYCODONE-ACETAMINOPHEN 5-325 MG PO TABS: 1.0000 | ORAL_TABLET | Freq: Once | ORAL | Status: DC

## 2012-12-23 MED ORDER — KETOROLAC TROMETHAMINE 30 MG/ML IJ SOLN
30.0000 mg | Freq: Once | INTRAMUSCULAR | Status: AC
  Administered 2012-12-23: 30 mg via INTRAMUSCULAR
  Filled 2012-12-23: qty 1

## 2012-12-23 NOTE — ED Provider Notes (Signed)
History    This chart was scribed for non-physician practitioner working with Toy Baker, MD by ED Scribe, Burman Nieves. This patient was seen in room WTR9/WTR9 and the patient's care was started at 6:08 PM.   CSN: [id number]  Arrival date & time 12/23/12  1720   First MD Initiated Contact with Patient 12/23/12 1808      Chief Complaint  Patient presents with  . Sore Throat    (Consider location/radiation/quality/duration/timing/severity/associated sxs/prior treatment) The history is provided by the patient. No language interpreter was used.   Diane [name] is a 32 y.o. female with h/o crohn's disease who presents to the Emergency Department complaining of moderate constant sore throat on the right side of her throat onset three weeks ago. Pt states sx are progressively worsening. Pt states she was tested for strep twice by her PCP (Dr. Caryl Never) and mono- all tests have come back negative. The pain has become so persistent that it is hard for her to eat and swallow. She reports it is inhibiting her from sleeping. Pt cannot fully rotate her head due to pain.  Pt has tried augmentin, keflex, vicodin, ibuprofen, and naproxen none of which have alleviated sx's. Pt denies fever, chills, cough, nausea, vomiting, diarrhea, SOB, weakness, and any other associated symptoms.    Past Medical History  Diagnosis Date  . Crohn's disease   . Pneumonia     History reviewed. No pertinent past surgical history.  Family History  Problem Relation Age of Onset  . Heart disease Mother 48    CHF    History  Substance Use Topics  . Smoking status: Never Smoker   . Smokeless tobacco: Never Used  . Alcohol Use: No    OB History   Grav Para Term Preterm Abortions TAB SAB Ect Mult Living                  Review of Systems  HENT: Positive for sore throat.   All other systems reviewed and are negative.    Allergies  Review of patient's allergies indicates no known allergies.  Home  Medications   Current Outpatient Rx  Name  Route  Sig  Dispense  Refill  . amoxicillin-clavulanate (AUGMENTIN) 875-125 MG per tablet   Oral   Take 1 tablet by mouth 2 (two) times daily.         . Cyanocobalamin (VITAMIN B-12 PO)   Oral   Take 1 tablet by mouth daily.         Marland Kitchen HYDROcodone-acetaminophen (HYCET) 7.5-325 mg/15 ml solution   Oral   Take 10 mLs by mouth 4 (four) times daily as needed for pain.   75 mL   0   . naproxen (NAPROSYN) 500 MG tablet   Oral   Take 500 mg by mouth as needed. Pain           LMP 12/23/2012  Physical Exam  Nursing note and vitals reviewed. Constitutional: She is oriented to person, place, and time. She appears well-developed and well-nourished. No distress.  HENT:  Head: Normocephalic and atraumatic. No trismus in the jaw.  Right Ear: Tympanic membrane and ear canal normal.  Left Ear: Tympanic membrane and ear canal normal.  Mouth/Throat: Uvula is midline, oropharynx is clear and moist and mucous membranes are normal. No oral lesions. No edematous. No oropharyngeal exudate, posterior oropharyngeal edema, posterior oropharyngeal erythema or tonsillar abscesses.  No PTA, post oropharyngeal edema or erythema, no trismus  Eyes: EOM are normal.  Neck: Neck supple. No spinous process tenderness present. No rigidity. No tracheal deviation present.    ROM limited due to pain and swelling, cervical lympahdenopathy R > L, TTP  Cardiovascular: Normal rate.   Pulmonary/Chest: Effort normal. No respiratory distress.  Musculoskeletal: Normal range of motion.  Lymphadenopathy:    She has cervical adenopathy (R > L).  Neurological: She is alert and oriented to person, place, and time.  Skin: Skin is warm and dry.  Psychiatric: She has a normal mood and affect. Her behavior is normal.    ED Course  Procedures (including critical care time) DIAGNOSTIC STUDIES: Oxygen Saturation is 98% on room air, normal by my interpretation.     COORDINATION OF CARE: 6:36 PM Discussed ED treatment with pt and pt agrees.     Labs Reviewed  CBC WITH DIFFERENTIAL - Abnormal; Notable for the following:    HCT 35.6 (*)    Neutrophils Relative 79 (*)    All other components within normal limits  BASIC METABOLIC PANEL - Abnormal; Notable for the following:    Potassium 2.8 (*)    All other components within normal limits   Ct Soft Tissue Neck W Contrast  12/23/2012  *RADIOLOGY REPORT*  Clinical Data: Sore throat  CT NECK WITH CONTRAST  Technique:  Multidetector CT imaging of the neck was performed with intravenous contrast.  Contrast: OMNIPAQUE IOHEXOL 300 MG/ML  SOLN  Comparison: None.  Findings: The visualized pharyngeal soft tissues are unremarkable.  The airway is patent.  No evidence of peritonsillar abscess.  Bilateral cervical lymphadenopathy measuring up to 1.4 cm short axis on the right and 1.2 cm short axis on the left (series 2/image 59).  Visualized thyroid is within normal limits.  Reversal of the normal cervical lordosis.  Cervical spine is otherwise normal.  Partial opacification of the left sphenoid sinus.  Visualized brain parenchyma is within normal limits.  Visualized lung apices are clear.  IMPRESSION: No evidence of peritonsillar abscess.  The airway is patent.  Bilateral cervical lymphadenopathy, measuring up to 1.4 cm short axis on the right. Given the clinical history, this appearance is favored to be reactive.  A lymphoproliferative disorder is considered less likely.   Original Report Authenticated By: Charline Bills, M.D.      1. Cervical adenopathy       MDM   Pt presenting to the ED with progressively worsening sore throat and cervical adenopathy now causing difficulty swallowing.  Failed abx therapy with augmentin and keflex.  CBC and BMP largely normal.  CT soft tissue consistent with reactive adenopathy.  No PTA or airway compromise noted on PE or scan.  Will give rx clindamycin, vicodin,  diflucan.  Pt requests referral to ENT for second opinion- Dr. Annalee Genta.  Return precautions advised.  I personally performed the services described in this documentation, which was scribed in my presence. The recorded information has been reviewed and is accurate.         Garlon Hatchet, PA-C 12/24/12 1218

## 2012-12-23 NOTE — ED Notes (Signed)
Patient transported to CT 

## 2012-12-23 NOTE — Telephone Encounter (Signed)
Patient Information:  Caller Name: Holland Falling  Phone: [phone]  Patient: Diane [name]  Gender: Female  DOB: [birth year]  Age: 32 Years  PCP: Carolann Littler (Family Practice)  Pregnant: No  Office Follow Up:  Does the office need to follow up with this patient?: Yes  Instructions For The Office: worsening sore throat, refusing another appointment. Recommendations?  RN Note:  Decreased fluid and food intake. Caller is urinating within normal limits. Caller is currently at work. Caller has not been taking the antibiotic. Caller is able to swallow however she prefers to spit saliva. Caller states she can't afford another office visit is getting worse and doesn't know what to do.  Symptoms  Reason For Call & Symptoms: sore throat  Reviewed Health History In EMR: Yes  Reviewed Medications In EMR: Yes  Reviewed Allergies In EMR: Yes  Reviewed Surgeries / Procedures: Yes  Date of Onset of Symptoms: 12/02/2012  Treatments Tried: hydrocodone, naproxen  Treatments Tried Worked: No OB / GYN:  LMP: 12/23/2012  Guideline(s) Used:  Sore Throat  Disposition Per Guideline:   See Today in Office  Reason For Disposition Reached:   Severe sore throat pain  Advice Given:  For Relief of Sore Throat Pain:  Sip warm chicken broth or apple juice.  Suck on hard candy or a throat lozenge (over-the-counter).  Gargle warm salt water 3 times daily (1 teaspoon of salt in 8 oz or 240 ml of warm water).  Avoid cigarette smoke.  Soft Diet:   Cold drinks and milk shakes are especially good (Reason: swollen tonsils can make some foods hard to swallow).  Liquids:  Adequate liquid intake is important to prevent dehydration. Drink 6-8 glasses of water per day.  Contagiousness:   You can return to work or school after the fever is gone and you feel well enough to participate in normal activities. If your doctor determines that you have Strep throat, then you will need to take an antibiotic for 24 hours before  you can return.  Expected Course:  Sore throats with viral illnesses usually last 3 or 4 days.  Call Back If:  Sore throat is the main symptom and it lasts longer than 24 hours  Sore throat is mild but lasts longer than 4 days  Fever lasts longer than 3 days  You become worse.  Patient Refused Recommendation:  Patient Refused Care Advice  caller states she cannot afford another visit.

## 2012-12-23 NOTE — ED Notes (Signed)
Per patient, she states that she has had a sore throat for three weeks now and it keeps getting worse. States she has taken Vicodin, ibuprofen and naproxen with no relief. Has been tested for strep twice and her PCP tested her for Mono and all test have come back negative. Pt states that the pain is so bad that she can't eat, hurts to swallow and is not sleeping well.

## 2012-12-23 NOTE — ED Notes (Signed)
PA Saunders at bedside. 

## 2012-12-24 NOTE — Progress Notes (Signed)
Quick Note:  Pt informed on personally identified VM, [facility] labs were faxed and received. I asked pt to call back to update [place] on how she was feeling ______

## 2012-12-24 NOTE — ED Provider Notes (Signed)
Medical screening examination/treatment/procedure(s) were performed by non-physician practitioner and as supervising physician I was immediately available for consultation/collaboration.  Leota Jacobsen, MD 12/24/12 1705

## 2012-12-25 ENCOUNTER — Telehealth: Payer: Self-pay | Admitting: Family Medicine

## 2012-12-25 NOTE — Telephone Encounter (Signed)
Spoke with patient and she states that she specifically asked for Decadron.  Is this okay to fill?

## 2012-12-25 NOTE — Telephone Encounter (Signed)
Caller: [name]/Patient; Phone: [phone]; Reason for Call: She calling regarding a expected call back from a call earlier today.  Izora Gala was going to speak with Dr Elease Hashimoto and call her back.

## 2012-12-25 NOTE — Telephone Encounter (Signed)
  We can use Decadron if she is not wanting to use the prednisone. There is no real clinical superiority of Decadron over prednisone. Decadron 4 mg one daily for 3 days, then one half daily for 3 days then d/c. The Decadron shot she had is still in her system so would not go with very high dose of oral steroid.

## 2012-12-25 NOTE — Telephone Encounter (Signed)
Attempted to return call to pt, message left for call back.

## 2012-12-25 NOTE — Telephone Encounter (Signed)
Patient Information:  Caller Name: Diane [name]  Phone: [phone]  Patient: Diane [name]  Gender: Female  DOB: November 16, 1980  Age: 32 Years  PCP: Carolann Littler (Family Practice)  Pregnant: No  Office Follow Up:  Does the office need to follow up with this patient?: Yes  Instructions For The Office: Caller requests oral steroids because she feels it caused her to improve; she is willing to schedule appointment if the only way she can get steroids is by injection. Patient requests to get callback ASAP!   Thank you.   Symptoms  Reason For Call & Symptoms: Swollen lymph nodes in neck bilaterally with right larger than left and she has been  seen multiple times for same since 12/08/12  - [facility], ED and PCP.  CT scan has been done. Pain resumed 12/25/12.  Advised see Today or Tomorrow in Office per Lymph Nodes - Swollen guideline. Caller asked for Rx oral steroids or only if she will get injection she will come to office for evaluation.  Reviewed Health History In EMR: Yes  Reviewed Medications In EMR: Yes  Reviewed Allergies In EMR: Yes  Reviewed Surgeries / Procedures: Yes  Date of Onset of Symptoms: 12/08/2012  Treatments Tried: Keflex, Vicodin, OTC meds, Ibuprofen, Augmentin, Naproxen, Decadron, Toradol  Treatments Tried Worked: No OB / GYN:  LMP: 12/23/2012  Guideline(s) Used:  Lymph Nodes - Swollen  Disposition Per Guideline:   See Today or Tomorrow in Office  Reason For Disposition Reached:   Very tender to the touch but no fever  Advice Given:  Swollen Lymph Nodes from a Viral Infection:  Viral throat infections and colds can cause lymph nodes in the neck to double in size. Slight enlargement and mild tenderness means the lymph node is fighting the infection and doing a good job.  Contagiousness: Swollen lymph nodes are not contagious.  Pain and Fever Medicines:  For pain or fever relief, take either acetaminophen or ibuprofen.  They are over-the-counter (OTC) drugs that  help treat both fever and pain. You can buy them at the drugstore.  Call Back If:  Node enlarges to more than 1 inch (2.5 cm) in size  Overlying skin becomes red  You become worse or are worried about a lymph node.  Patient Refused Recommendation:  Patient Requests Prescription  Requests oral steroids

## 2012-12-25 NOTE — Telephone Encounter (Signed)
Patient stated that her swelling is returning, she would like to get the Decadron in a pill form possibly not generic for her swelling. The ED gave her pain med's also but she really needs the Decadron.

## 2012-12-25 NOTE — Telephone Encounter (Signed)
We can add oral prednisone taper.  Prednisone 10mg  and taper as follows:4-4-4-3-3-2-2-1-1 disp #24

## 2012-12-26 ENCOUNTER — Telehealth: Payer: Self-pay | Admitting: Family Medicine

## 2012-12-26 MED ORDER — DEXAMETHASONE 4 MG PO TABS: ORAL_TABLET | ORAL | Status: DC

## 2012-12-26 NOTE — Telephone Encounter (Signed)
Rx sent to pharmacy   

## 2012-12-26 NOTE — Telephone Encounter (Signed)
[facility] Triage Call Report Triage Record Num: [id number] Operator: Tomasita Crumble Patient Name: Diane [name] Call Date & Time: 12/25/2012 5:58:09PM Patient Phone: [phone] PCP: Evelena Peat Patient Gender: Female PCP Fax : [phone] Patient DOB: Jun 18, 1981 [facility] Name[facility] Reason for Call: Caller: Diane [name]/Patient; PCP: Evelena Peat (Family Practice); CB#: [phone]; Call regarding Rx for steroids and she has questions RN unable to answer. She asks if Prednisone taper is comparable to Decadron she received in ED for her symptoms recently. She was told in ED that Decadron was specific for the lymph node swelling. Contacted Dr. Debby Bud on call provider. He advised that the Decadron is first choice for Brain mets or swelling, but there is no specific steroid for lymphadenopathy and so the Prednisone is appropriate for this patient. She was informed of same. Caller states she does not know if Rx is at pharmacy, but she is now willing to take the Prednisone. Rx called to [facility] on [address] per caller request and order in EMR. Caller informed of same. COPIED FROM EMR: Call Documentation Forbes Cellar at 12/25/2012 4:59 PM Status: Signed Caller: Diane [name]/Patient; Phone: [phone]; Reason for Call: She calling regarding a expected call back from a call earlier today. Harriett Sine was going to speak with Dr Caryl Never and call her back. Trenton Gammon, CMA at 12/25/2012 3:56 PM Status: Signed Spoke with patient and she states that she specifically asked for Decadron. Is this okay to fill? Kristian Covey, MD at 12/25/2012 3:44 PM Status: Signed We can add oral prednisone taper. Prednisone 10mg  and taper as follows:4-4-4-3-3-2-2-1-1 disp #24 Protocol(s) Used: Medication Questions - Adult Recommended Outcome per Protocol: Speak with Provider or Pharmacist within 24 hours Override Outcome if Used in Protocol: Call Provider Immediately RN Reason for Override  Outcome: Nursing Judgement Used. Reason for Outcome: Older adult with questions about prescribed and/or nonprescribed medications not covered by available resources Care Advice: Safe Use of Medications: - Keep a list of your medications, listing both brand and generic names, the prescribed dosage, common side effects, recommended action for side effects, when to call their provider, and any other specific instructions. This medication list should be updated if any prescriptions change or if new medications are added. Your list should include nonprescription medications, vitamins and supplements. Ask about interaction with other medications, supplements or foods. - Take the medication list to each provider visit, especially if seeing more than one doctor. Make note of any known allergies on this list. - Use your medication exactly as directed. Any concerns about side effects should be discussed with your pharmacist or prescribing provider before changing doses or stopping the medication. - Don't chew or crush tablets or open capsules unless told to do so. Some long-acting medications can be absorbed too quickly when chewed or crushed. Other medications either won't be effective or could cause side effects. - If you have difficulty swallowing pills, ask your provider or the pharmacist if the medication is available in liquid ~ 12/25/2012 6:23:52PM Page 1 of 2 [id number] [facility] Triage Call Report Patient Name: Diane [name] continuation page/s

## 2012-12-29 ENCOUNTER — Telehealth: Payer: Self-pay | Admitting: Family Medicine

## 2012-12-29 DIAGNOSIS — J312 Chronic pharyngitis: Secondary | ICD-10-CM

## 2012-12-29 NOTE — Telephone Encounter (Signed)
Patient would like to receive a call back today.

## 2012-12-29 NOTE — Telephone Encounter (Signed)
We can set up to see ID, but if her lymph node swelling is not improving, I would recommend ENT as first step.  IF bx indicated, this would have to be done by ENT.

## 2012-12-29 NOTE — Telephone Encounter (Signed)
Patient called stating that she would like to have a referral to infectious disease to find out what exactly is going on with her lymph nodes. Please assist.

## 2012-12-30 NOTE — Telephone Encounter (Signed)
I called and spoke with Diane [name] at ID. She has the referral and will call pt to schedule. Encounter closed.

## 2012-12-30 NOTE — Telephone Encounter (Signed)
I have answered all phone notes promptly. If there was delay in getting oral steroids this is because she did not want the prednisone we called in.  She specifically requested Decadron and I did agree to call in Decadron-and my note gives specifics of how this was to be dosed..  Go ahead and set up ID referral per her request.

## 2012-12-30 NOTE — Telephone Encounter (Signed)
Patient called back. She was quite upset, states she feels we are not doing enough fast enough regarding her care & symptoms. She explained she realizes she is not the only patient. I went through all of the call documentation with her, the time line, to explain why things take a certain amount of time. When she said we had never called in the prednisone, I explained the prednisone had never been called in by [place] because she stated she wanted Decadron when Fleet Contras talked with her.   Pt calling today because she is concerned and upset that she has not heard back about her ID referral. I told her your recommendations and that the nurse was going to call her today. Pt states that in terms of her referral, she wants a referral to ID, not ENT.  Please place referral today if at all possible. I told pt I would call her back when placed, so she can follow up with ID to sched appt.

## 2012-12-31 ENCOUNTER — Telehealth: Payer: Self-pay | Admitting: Family Medicine

## 2012-12-31 DIAGNOSIS — J312 Chronic pharyngitis: Secondary | ICD-10-CM

## 2012-12-31 NOTE — Telephone Encounter (Signed)
Pt can not be seen by infectious disease and would like to go Ahead with the ENT referral

## 2012-12-31 NOTE — Telephone Encounter (Signed)
Pt is aware and is OK with ENT referral.  I did make the referral,  FYI

## 2013-01-02 ENCOUNTER — Encounter: Payer: Self-pay | Admitting: Family Medicine

## 2013-01-06 ENCOUNTER — Telehealth: Payer: Self-pay | Admitting: Family Medicine

## 2013-01-06 MED ORDER — PREDNISONE 10 MG PO TABS: 10.0000 mg | ORAL_TABLET | Freq: Every day | ORAL | Status: DC

## 2013-01-06 NOTE — Telephone Encounter (Signed)
Pt informed on VM Rx sent to her pharmacy as requested

## 2013-01-06 NOTE — Telephone Encounter (Signed)
Prednisone 10 mg-taper: 4-4-4-3-3-3-2-2-1-1 #27 

## 2013-01-06 NOTE — Telephone Encounter (Signed)
Patient is currently seeing an ENT for enlarged Tonsils and infection.  ENT [facility].  She was placed on Zpack and is scheduled to have Tonsils removed on 01/15/13.  They (the office)  requested that she contact Dr. Elease Hashimoto and have a refill of Decadron or prednisone until surgery.  She is asking for refill to be sent to [facility] on [address].   Call patient for any questions [phone] cell phone.

## 2013-01-15 ENCOUNTER — Other Ambulatory Visit: Payer: Self-pay | Admitting: Otolaryngology

## 2013-01-19 ENCOUNTER — Emergency Department (HOSPITAL_COMMUNITY)
Admission: EM | Admit: 2013-01-19 | Discharge: 2013-01-19 | Disposition: A | Discharge status: Home / Self Care | Payer: 59 | Attending: Emergency Medicine | Admitting: Emergency Medicine

## 2013-01-19 ENCOUNTER — Encounter (HOSPITAL_COMMUNITY): Payer: Self-pay | Admitting: Emergency Medicine

## 2013-01-19 DIAGNOSIS — R112 Nausea with vomiting, unspecified: Secondary | ICD-10-CM | POA: Insufficient documentation

## 2013-01-19 DIAGNOSIS — R5082 Postprocedural fever: Secondary | ICD-10-CM | POA: Insufficient documentation

## 2013-01-19 DIAGNOSIS — R109 Unspecified abdominal pain: Secondary | ICD-10-CM | POA: Insufficient documentation

## 2013-01-19 DIAGNOSIS — R0789 Other chest pain: Secondary | ICD-10-CM | POA: Insufficient documentation

## 2013-01-19 DIAGNOSIS — J029 Acute pharyngitis, unspecified: Secondary | ICD-10-CM | POA: Insufficient documentation

## 2013-01-19 DIAGNOSIS — Z8719 Personal history of other diseases of the digestive system: Secondary | ICD-10-CM | POA: Insufficient documentation

## 2013-01-19 DIAGNOSIS — H9209 Otalgia, unspecified ear: Secondary | ICD-10-CM | POA: Insufficient documentation

## 2013-01-19 DIAGNOSIS — Y838 Other surgical procedures as the cause of abnormal reaction of the patient, or of later complication, without mention of misadventure at the time of the procedure: Secondary | ICD-10-CM | POA: Insufficient documentation

## 2013-01-19 DIAGNOSIS — Z9089 Acquired absence of other organs: Secondary | ICD-10-CM | POA: Insufficient documentation

## 2013-01-19 DIAGNOSIS — Z8701 Personal history of pneumonia (recurrent): Secondary | ICD-10-CM | POA: Insufficient documentation

## 2013-01-19 LAB — COMPREHENSIVE METABOLIC PANEL
ALT: 21 U/L (ref 0–35)
AST: 21 U/L (ref 0–37)
Albumin: 3.2 g/dL — ABNORMAL LOW (ref 3.5–5.2)
Alkaline Phosphatase: 97 U/L (ref 39–117)
BUN: 7 mg/dL (ref 6–23)
CO2: 28 mEq/L (ref 19–32)
Calcium: 9 mg/dL (ref 8.4–10.5)
Chloride: 98 mEq/L (ref 96–112)
Creatinine, Ser: 0.71 mg/dL (ref 0.50–1.10)
GFR calc Af Amer: >90 mL/min (ref >90)
GFR calc non Af Amer: >90 mL/min (ref >90)
Glucose, Bld: 103 mg/dL — ABNORMAL HIGH (ref 70–99)
Potassium: 3.5 mEq/L (ref 3.5–5.1)
Sodium: 134 mEq/L — ABNORMAL LOW (ref 135–145)
Total Bilirubin: 0.5 mg/dL (ref 0.3–1.2)
Total Protein: 7.2 g/dL (ref 6.0–8.3)

## 2013-01-19 LAB — CBC WITH DIFFERENTIAL/PLATELET
Basophils Absolute: 0 10*3/uL (ref 0.0–0.1)
Basophils Relative: 0 % (ref 0–1)
Eosinophils Absolute: 0.2 10*3/uL (ref 0.0–0.7)
Eosinophils Relative: 2 % (ref 0–5)
HCT: 40.4 % (ref 36.0–46.0)
Hemoglobin: 13.7 g/dL (ref 12.0–15.0)
Lymphocytes Relative: 11 % — ABNORMAL LOW (ref 12–46)
Lymphs Abs: 1.3 10*3/uL (ref 0.7–4.0)
MCH: 28.7 pg (ref 26.0–34.0)
MCHC: 33.9 g/dL (ref 30.0–36.0)
MCV: 84.7 fL (ref 78.0–100.0)
Monocytes Absolute: 0.7 10*3/uL (ref 0.1–1.0)
Monocytes Relative: 6 % (ref 3–12)
Neutro Abs: 9.2 10*3/uL — ABNORMAL HIGH (ref 1.7–7.7)
Neutrophils Relative %: 81 % — ABNORMAL HIGH (ref 43–77)
Platelets: 231 10*3/uL (ref 150–400)
RBC: 4.77 MIL/uL (ref 3.87–5.11)
RDW: 13.5 % (ref 11.5–15.5)
WBC: 11.3 10*3/uL — ABNORMAL HIGH (ref 4.0–10.5)

## 2013-01-19 LAB — POCT I-STAT, CHEM 8
BUN: 6 mg/dL (ref 6–23)
Calcium, Ion: 1.07 mmol/L — ABNORMAL LOW (ref 1.12–1.23)
Chloride: 99 mEq/L (ref 96–112)
Creatinine, Ser: 0.9 mg/dL (ref 0.50–1.10)
Glucose, Bld: 106 mg/dL — ABNORMAL HIGH (ref 70–99)
HCT: 42 % (ref 36.0–46.0)
Hemoglobin: 14.3 g/dL (ref 12.0–15.0)
Potassium: 3.4 mEq/L — ABNORMAL LOW (ref 3.5–5.1)
Sodium: 137 mEq/L (ref 135–145)
TCO2: 30 mmol/L (ref 0–100)

## 2013-01-19 LAB — MONONUCLEOSIS SCREEN: Mono Screen: NEGATIVE

## 2013-01-19 MED ORDER — CLINDAMYCIN PHOSPHATE 600 MG/50ML IV SOLN
600.0000 mg | Freq: Once | INTRAVENOUS | Status: AC
  Administered 2013-01-19: 600 mg via INTRAVENOUS
  Filled 2013-01-19: qty 50

## 2013-01-19 MED ORDER — ONDANSETRON HCL 4 MG/2ML IJ SOLN
4.0000 mg | Freq: Once | INTRAMUSCULAR | Status: AC
  Administered 2013-01-19: 4 mg via INTRAVENOUS
  Filled 2013-01-19: qty 2

## 2013-01-19 MED ORDER — CLINDAMYCIN HCL 150 MG PO CAPS: 300.0000 mg | ORAL_CAPSULE | Freq: Three times a day (TID) | ORAL | Status: DC

## 2013-01-19 MED ORDER — SODIUM CHLORIDE 0.9 % IV BOLUS (SEPSIS)
1000.0000 mL | Freq: Once | INTRAVENOUS | Status: AC
  Administered 2013-01-19: 1000 mL via INTRAVENOUS

## 2013-01-19 MED ORDER — HYDROMORPHONE HCL PF 1 MG/ML IJ SOLN
1.0000 mg | Freq: Once | INTRAMUSCULAR | Status: AC
  Administered 2013-01-19: 1 mg via INTRAVENOUS
  Filled 2013-01-19: qty 1

## 2013-01-19 MED ORDER — KETOROLAC TROMETHAMINE 30 MG/ML IJ SOLN
30.0000 mg | Freq: Once | INTRAMUSCULAR | Status: AC
  Administered 2013-01-19: 30 mg via INTRAVENOUS
  Filled 2013-01-19: qty 1

## 2013-01-19 NOTE — ED Provider Notes (Signed)
History     CSN: [id number]  Arrival date & time 01/19/13  5916   First MD Initiated Contact with Patient 01/19/13 [phone]      Chief Complaint  Patient presents with  . Fever  . Emesis  . Otalgia    right  . postop complications     (Consider location/radiation/quality/duration/timing/severity/associated sxs/prior treatment) HPI Comments: 32 year old female, history of recent tonsillectomy performed by Dr. Lucia Gaskins on April 24. She was discharged home in improved condition with prescriptions for amoxicillin suspension and hydrocodone liquid. She has been doing well until yesterday when she developed recurrent fevers, significant sore throat on the left side as well as nausea and vomiting. She has vomited several times this morning which appears to be a green-colored mucus. The patient denies significant abdominal pain, chest pain though she does have a feeling of acid reflux in her throat because of the vomiting this morning. She denies swelling of her legs, rashes of her skin, diarrhea or dysuria. The symptoms are persistent, gradually worsening, moderate to severe.  MI states that she has had increased pressure in her chest with the vomiting of green-colored liquid.  This is a burning sensation according to the patient it is intermittent, radiating up into her throat  Patient is a 32 y.o. female presenting with fever, vomiting, and ear pain. The history is provided by the patient.  Fever Associated symptoms: ear pain and vomiting   Emesis Otalgia Associated symptoms: fever and vomiting     Past Medical History  Diagnosis Date  . Crohn's disease   . Pneumonia     Past Surgical History  Procedure Laterality Date  . Tonsillectomy    . Adenoidectomy      Family History  Problem Relation Age of Onset  . Heart disease Mother 21    CHF    History  Substance Use Topics  . Smoking status: Never Smoker   . Smokeless tobacco: Never Used  . Alcohol Use: No    OB History   Grav Para Term Preterm Abortions TAB SAB Ect Mult Living                  Review of Systems  Constitutional: Positive for fever.  HENT: Positive for ear pain.   Gastrointestinal: Positive for vomiting.  All other systems reviewed and are negative.    Allergies  Review of patient's allergies indicates no known allergies.  Home Medications   Current Outpatient Rx  Name  Route  Sig  Dispense  Refill  . acetaminophen (TYLENOL) 500 MG tablet   Oral   Take 500 mg by mouth every 6 (six) hours as needed for pain (pain).         Marland Kitchen amoxicillin (AMOXIL) 500 MG capsule   Oral   Take 500 mg by mouth 2 (two) times daily. Started on 01-15-13. For 7 day therapy.         . Hydrocodone-Acetaminophen 10-300 MG/15ML SOLN   Oral   Take 10-15 mLs by mouth every 4 (four) hours as needed (pain).         Marland Kitchen ibuprofen (ADVIL,MOTRIN) 100 MG/5ML suspension   Oral   Take 400 mg by mouth every 4 (four) hours as needed for fever (pain).         . predniSONE (DELTASONE) 10 MG tablet   Oral   Take 1 tablet (10 mg total) by mouth daily. Taper dose as follows, 4-4-4-3-3-3-2-2-1-1   27 tablet   0   . clindamycin (CLEOCIN)  150 MG capsule   Oral   Take 2 capsules (300 mg total) by mouth 3 (three) times daily. May dispense as 11m capsules   60 capsule   0   . fluconazole (DIFLUCAN) 150 MG tablet   Oral   Take 1 tablet (150 mg total) by mouth once. Repeat in 72 hours if needed   2 tablet   0     BP 154/95  Pulse 134  Temp(Src) 101.3 F (38.5 C) (Rectal)  Resp 20  SpO2 97%  LMP 12/23/2012  Physical Exam  Nursing note and vitals reviewed. Constitutional: She appears well-developed and well-nourished. No distress.  HENT:  Head: Normocephalic and atraumatic.  Posterior pharynx diffusely erythematous, there is a normal appearing postoperative exudate in her lateral tonsillar beds however the posterior soft palate and uvula has a indurated erythematous appearance. She has pain with  extrusion of the tongue but no tenderness underneath the tongue in the sublingual area. I'll trismus but can open up several centimeters. There is no tenderness in the submandibular or sublingual area.  Eyes: Conjunctivae and EOM are normal. Pupils are equal, round, and reactive to light. Right eye exhibits no discharge. Left eye exhibits no discharge. No scleral icterus.  Neck: Normal range of motion. Neck supple. No JVD present. No thyromegaly present.  No torticollis, tenderness in the left lower anterior cervical chain, no significant lymphadenopathy in the neck  Cardiovascular: Regular rhythm, normal heart sounds and intact distal pulses.  Exam reveals no gallop and no friction rub.   No murmur heard. Tachycardic to 135  Pulmonary/Chest: Effort normal and breath sounds normal. No respiratory distress. She has no wheezes. She has no rales.  Abdominal: Soft. Bowel sounds are normal. She exhibits no distension and no mass. There is tenderness ( Mild diffuse tenderness, no guarding, no masses, no peritoneal signs, no hepatosplenomegaly).  Musculoskeletal: Normal range of motion. She exhibits no edema and no tenderness.  Lymphadenopathy:    She has no cervical adenopathy.  Neurological: She is alert. Coordination normal.  Skin: Skin is warm and dry. No rash noted. No erythema.  Psychiatric: She has a normal mood and affect. Her behavior is normal.    ED Course  Procedures (including critical care time)  Labs Reviewed  CBC WITH DIFFERENTIAL - Abnormal; Notable for the following:    WBC 11.3 (*)    Neutrophils Relative 81 (*)    Neutro Abs 9.2 (*)    Lymphocytes Relative 11 (*)    All other components within normal limits  COMPREHENSIVE METABOLIC PANEL - Abnormal; Notable for the following:    Sodium 134 (*)    Glucose, Bld 103 (*)    Albumin 3.2 (*)    All other components within normal limits  POCT I-STAT, CHEM 8 - Abnormal; Notable for the following:    Potassium 3.4 (*)     Glucose, Bld 106 (*)    Calcium, Ion 1.07 (*)    All other components within normal limits  MONONUCLEOSIS SCREEN   No results found.   1. Postsurgical fever       MDM  There is concern for recurrent infection in the patient's pharynx. She has a tachycardia and a fever of 102.3. Antipyretics, IV fluids, IV antibiotics and labs have been ordered, with a consultation with ENT to discuss patient's disposition.  At this time her mental status is normal and it appears that the focus of infection is the pharynx.  Filed Vitals:   01/19/13 1004  BP:  Pulse:   Temp: 101.3 F (38.5 C)  Resp:    D/w Dr. Benjamine Mola - recommends pt to go to office to see Dr. Lucia Gaskins.  VS improved with meds, she appears non toxic.    Dr. Lucia Gaskins has called and will see in office this aftenoon.  LFTs normal, no signs of transaminitis, minimal leukocytosis.    ED ECG REPORT  I personally interpreted this EKG   Date: 01/19/2013   Rate: 99  Rhythm: normal sinus rhythm  QRS Axis: normal  Intervals: normal  ST/T Wave abnormalities: normal  Conduction Disutrbances:none  Narrative Interpretation:   Old EKG Reviewed: none available  EKG normal, patient appears stable, results as went to the patient, will followup this afternoon with ENT.   Johnna Acosta, MD 01/19/13 1210

## 2013-01-19 NOTE — ED Notes (Signed)
Pt had T&A done last Thursday, pt started having severe pain around 4p yesterday in her right ear and head that the pain meds weren't relieving. Pt also been having a fever that started yesterday.  Pt vomited 2-3 this morning green mucous then she vomited undescribable stuff, pt's mother just knew it wasn't good bc she knew her daughter hadnt been eating any foods that it could possibly be.  Pt's mother states that pt was sounding very "gurgly last night while she was sleeping".

## 2013-02-11 ENCOUNTER — Encounter: Payer: Self-pay | Admitting: Family Medicine

## 2013-02-11 ENCOUNTER — Ambulatory Visit (INDEPENDENT_AMBULATORY_CARE_PROVIDER_SITE_OTHER): Payer: 59 | Admitting: Family Medicine

## 2013-02-11 ENCOUNTER — Telehealth: Payer: Self-pay | Admitting: Family Medicine

## 2013-02-11 VITALS — BP 130/90 | Temp 98.6°F | Wt 179.0 lb

## 2013-02-11 DIAGNOSIS — R030 Elevated blood-pressure reading, without diagnosis of hypertension: Secondary | ICD-10-CM

## 2013-02-11 DIAGNOSIS — IMO0001 Reserved for inherently not codable concepts without codable children: Secondary | ICD-10-CM

## 2013-02-11 NOTE — Patient Instructions (Addendum)
Be in touch for BP consistently > 140/90. Try to get back to regular aerobic exercise.

## 2013-02-11 NOTE — Progress Notes (Signed)
  Subjective:    Patient ID: Diane [name], female    DOB: December 07, 1980, 32 y.o.   MRN: [id number]  HPI Concern for elevated blood pressure. Patient's had some nonspecific mild headaches past couple days. She went to get a home blood pressure monitor with wrist monitor had high reading of 143/98 She's had prior history of mildly elevated blood pressure off and on. Has been taking some Motrin recently for postsurgical pain following tonsillectomy. Denies any chest pains, dyspnea, or dizziness. No alcohol use  Past Medical History  Diagnosis Date  . Crohn's disease   . Pneumonia    Past Surgical History  Procedure Laterality Date  . Tonsillectomy    . Adenoidectomy      reports that she has never smoked. She has never used smokeless tobacco. She reports that she does not drink alcohol or use illicit drugs. family history includes Heart disease (age of onset: 76) in her mother. No Known Allergies    Review of Systems  Constitutional: Negative for fever, chills and fatigue.  Eyes: Negative for visual disturbance.  Respiratory: Negative for cough, chest tightness, shortness of breath and wheezing.   Cardiovascular: Negative for chest pain and palpitations. Leg swelling: Mild chronic swelling which is unchanged.  Neurological: Negative for dizziness, seizures, syncope, weakness, light-headedness and headaches.       Objective:   Physical Exam  Constitutional: She is oriented to person, place, and time. She appears well-developed and well-nourished.  Neck: Neck supple.  Cardiovascular: Normal rate and regular rhythm.  Exam reveals no gallop.   No murmur heard. Pulmonary/Chest: Effort normal and breath sounds normal. No respiratory distress. She has no wheezes. She has no rales.  Musculoskeletal: Edema: no pitting edema.  Neurological: She is alert and oriented to person, place, and time.          Assessment & Plan:  #1 recent mildly elevated blood pressure. By my reading  here today left arm seated at rest 130/80. Similar reading with her cuff. Observe for now. Try to establish more consistent exercise. Watch sodium intake.

## 2013-02-11 NOTE — Telephone Encounter (Signed)
Patient Information:  Caller Name: [name]  Phone: [phone]  Patient: Diane [name]  Gender: Female  DOB: September 17, 1981  Age: 32 Years  PCP: Carolann Littler (Family Practice)  Pregnant: No  Office Follow Up:  Does the office need to follow up with this patient?: No  Instructions For The Office: N/A   Symptoms  Reason For Call & Symptoms: Pt has had increased BP readings over the past 4 days. This am it was 123/84. Now it is 151/100. Pt has also had a headache ongoing since Sat 02/07/13. Pt is having to rotate Tylenol and Ibuprofen q4h to keep it tolerable.  Reviewed Health History In EMR: Yes  Reviewed Medications In EMR: Yes  Reviewed Allergies In EMR: Yes  Reviewed Surgeries / Procedures: Yes  Date of Onset of Symptoms: 02/07/2013  Treatments Tried: Ibrprofen 425m q4h - rotating with/Tylenol 5067mq4h  Treatments Tried Worked: Yes OB / GYN:  LMP: Unknown  Guideline(s) Used:  High Blood Pressure  Headache  Disposition Per Guideline:   See Today or Tomorrow in Office  Reason For Disposition Reached:   Unexplained headache that is present > 24 hours  Advice Given:  N/A  Patient Will Follow Care Advice:  YES  Appointment Scheduled:  02/11/2013 16:00:00 Appointment Scheduled Provider:  [facility]

## 2013-04-08 ENCOUNTER — Encounter (HOSPITAL_COMMUNITY): Payer: Self-pay | Admitting: Emergency Medicine

## 2013-04-08 ENCOUNTER — Emergency Department (HOSPITAL_COMMUNITY)
Admission: EM | Admit: 2013-04-08 | Discharge: 2013-04-09 | Disposition: A | Discharge status: Home / Self Care | Payer: 59 | Attending: Emergency Medicine | Admitting: Emergency Medicine

## 2013-04-08 DIAGNOSIS — M79609 Pain in unspecified limb: Secondary | ICD-10-CM | POA: Insufficient documentation

## 2013-04-08 DIAGNOSIS — Z8719 Personal history of other diseases of the digestive system: Secondary | ICD-10-CM | POA: Insufficient documentation

## 2013-04-08 DIAGNOSIS — Z8701 Personal history of pneumonia (recurrent): Secondary | ICD-10-CM | POA: Insufficient documentation

## 2013-04-08 DIAGNOSIS — M7989 Other specified soft tissue disorders: Secondary | ICD-10-CM | POA: Insufficient documentation

## 2013-04-08 DIAGNOSIS — Z79899 Other long term (current) drug therapy: Secondary | ICD-10-CM | POA: Insufficient documentation

## 2013-04-08 NOTE — ED Notes (Signed)
PT. REPORTS RIGHT LOWER LEG / RIGHT FOOT PAIN WITH SWELLING ONSET YESTERDAY , DENIES INJURY , AMBULATORY , PEDAL PULSES PRESENT / CAP REFILL < 2.

## 2013-04-09 ENCOUNTER — Emergency Department (HOSPITAL_COMMUNITY): Payer: 59

## 2013-04-09 ENCOUNTER — Ambulatory Visit (HOSPITAL_COMMUNITY)
Admission: RE | Admit: 2013-04-09 | Discharge: 2013-04-09 | Disposition: A | Discharge status: Home / Self Care | Payer: 59 | Source: Ambulatory Visit | Attending: Emergency Medicine | Admitting: Emergency Medicine

## 2013-04-09 ENCOUNTER — Other Ambulatory Visit (HOSPITAL_COMMUNITY): Payer: Self-pay | Admitting: Emergency Medicine

## 2013-04-09 DIAGNOSIS — M25561 Pain in right knee: Secondary | ICD-10-CM

## 2013-04-09 DIAGNOSIS — M7989 Other specified soft tissue disorders: Secondary | ICD-10-CM

## 2013-04-09 DIAGNOSIS — M79609 Pain in unspecified limb: Secondary | ICD-10-CM

## 2013-04-09 LAB — POCT I-STAT, CHEM 8
BUN: 13 mg/dL (ref 6–23)
Calcium, Ion: 1.13 mmol/L (ref 1.12–1.23)
Chloride: 101 mEq/L (ref 96–112)
Creatinine, Ser: 0.8 mg/dL (ref 0.50–1.10)
Glucose, Bld: 111 mg/dL — ABNORMAL HIGH (ref 70–99)
HCT: 31 % — ABNORMAL LOW (ref 36.0–46.0)
Hemoglobin: 10.5 g/dL — ABNORMAL LOW (ref 12.0–15.0)
Potassium: 3 mEq/L — ABNORMAL LOW (ref 3.5–5.1)
Sodium: 142 mEq/L (ref 135–145)
TCO2: 27 mmol/L (ref 0–100)

## 2013-04-09 MED ORDER — ONDANSETRON HCL 4 MG/2ML IJ SOLN
4.0000 mg | Freq: Once | INTRAMUSCULAR | Status: AC
  Administered 2013-04-09: 4 mg via INTRAVENOUS
  Filled 2013-04-09: qty 2

## 2013-04-09 MED ORDER — ENOXAPARIN SODIUM 100 MG/ML ~~LOC~~ SOLN
80.0000 mg | Freq: Once | SUBCUTANEOUS | Status: AC
  Administered 2013-04-09: 80 mg via SUBCUTANEOUS
  Filled 2013-04-09: qty 1

## 2013-04-09 MED ORDER — TRAMADOL HCL 50 MG PO TABS
50.0000 mg | ORAL_TABLET | Freq: Once | ORAL | Status: AC
  Administered 2013-04-09: 50 mg via ORAL
  Filled 2013-04-09: qty 1

## 2013-04-09 MED ORDER — POTASSIUM CHLORIDE CRYS ER 20 MEQ PO TBCR
40.0000 meq | EXTENDED_RELEASE_TABLET | Freq: Once | ORAL | Status: AC
  Administered 2013-04-09: 40 meq via ORAL
  Filled 2013-04-09: qty 2

## 2013-04-09 MED ORDER — HYDROMORPHONE HCL PF 1 MG/ML IJ SOLN
1.0000 mg | Freq: Once | INTRAMUSCULAR | Status: AC
  Administered 2013-04-09: 1 mg via INTRAVENOUS
  Filled 2013-04-09: qty 1

## 2013-04-09 MED ORDER — TRAMADOL HCL 50 MG PO TABS: 50.0000 mg | ORAL_TABLET | Freq: Four times a day (QID) | ORAL | Status: DC | PRN

## 2013-04-09 MED ORDER — POTASSIUM CHLORIDE 20 MEQ/15ML (10%) PO LIQD
40.0000 meq | Freq: Once | ORAL | Status: AC
  Administered 2013-04-09: 40 meq via ORAL
  Filled 2013-04-09: qty 30

## 2013-04-09 NOTE — ED Provider Notes (Signed)
History    CSN: [id number] Arrival date & time 04/08/13  2126  First MD Initiated Contact with Patient 04/09/13 0053     Chief Complaint  Patient presents with  . Leg Pain   (Consider location/radiation/quality/duration/timing/severity/associated sxs/prior Treatment) HPI History provided by patient. Right leg pain and swelling.  Onset yesterday. Location right ankle initially and now involves right calf. No trauma. Worse with ambulation. Patient does take birth control pills but denies any history of DVT or PE. She denies any fevers, chest pain or shortness of breath. No redness or increased warmth to touch. Symptoms moderate severity without known alleviating factors.  Past Medical History  Diagnosis Date  . Crohn's disease   . Pneumonia    Past Surgical History  Procedure Laterality Date  . Tonsillectomy    . Adenoidectomy     Family History  Problem Relation Age of Onset  . Heart disease Mother 70    CHF   History  Substance Use Topics  . Smoking status: Never Smoker   . Smokeless tobacco: Never Used  . Alcohol Use: No   OB History   Grav Para Term Preterm Abortions TAB SAB Ect Mult Living                 Review of Systems  Constitutional: Negative for fever and chills.  HENT: Negative for neck pain and neck stiffness.   Eyes: Negative for pain.  Respiratory: Negative for shortness of breath.   Cardiovascular: Negative for chest pain.  Gastrointestinal: Negative for abdominal pain.  Genitourinary: Negative for dysuria.  Musculoskeletal: Negative for back pain.  Skin: Negative for rash.  Neurological: Negative for headaches.  All other systems reviewed and are negative.    Allergies  Review of patient's allergies indicates no known allergies.  Home Medications   Current Outpatient Rx  Name  Route  Sig  Dispense  Refill  . acetaminophen (TYLENOL) 500 MG tablet   Oral   Take 500 mg by mouth every 6 (six) hours as needed for pain (pain).         Marland Kitchen  FLUoxetine (PROZAC) 20 MG capsule   Oral   Take 20 mg by mouth daily.         Marland Kitchen ibuprofen (ADVIL,MOTRIN) 100 MG/5ML suspension   Oral   Take 400 mg by mouth every 4 (four) hours as needed for fever (pain).         . naproxen (NAPROSYN) 500 MG tablet   Oral   Take 500 mg by mouth 2 (two) times daily with a meal.         . traMADol (ULTRAM) 50 MG tablet   Oral   Take 50 mg by mouth every 6 (six) hours as needed for pain.          BP 131/78  Pulse 99  Temp(Src) 98.4 F (36.9 C)  Resp 16  SpO2 96%  LMP 04/08/2013 Physical Exam  Nursing note and vitals reviewed. Constitutional: She is oriented to person, place, and time. She appears well-developed and well-nourished.  HENT:  Head: Normocephalic and atraumatic.  Eyes: EOM are normal. Pupils are equal, round, and reactive to light.  Neck: Neck supple.  Cardiovascular: Normal rate, normal heart sounds and intact distal pulses.   Pulmonary/Chest: Effort normal and breath sounds normal. No respiratory distress.  Abdominal: Soft. Bowel sounds are normal. She exhibits no distension. There is no tenderness.  Musculoskeletal: Normal range of motion.  Right lower extremity mild tenderness over the  calf and more so over the medial ankle with 1+ nonpitting edema versus left lower extremity. Distal neurovascular intact. No cords. Negative Homans.  Neurological: She is alert and oriented to person, place, and time.  Skin: Skin is warm and dry.    ED Course  Procedures (including critical care time)  Results for orders placed during the hospital encounter of 04/08/13  POCT I-STAT, CHEM 8      Result Value Range   Sodium 142  135 - 145 mEq/L   Potassium 3.0 (*) 3.5 - 5.1 mEq/L   Chloride 101  96 - 112 mEq/L   BUN 13  6 - 23 mg/dL   Creatinine, Ser 1.61  0.50 - 1.10 mg/dL   Glucose, Bld 096 (*) 70 - 99 mg/dL   Calcium, Ion 0.45  4.09 - 1.23 mmol/L   TCO2 27  0 - 100 mmol/L   Hemoglobin 10.5 (*) 12.0 - 15.0 g/dL   HCT 81.1 (*)  91.4 - 46.0 %   Dg Ankle Complete Right  04/09/2013   *RADIOLOGY REPORT*  Clinical Data: Right ankle pain.  RIGHT ANKLE - COMPLETE 3+ VIEW  Comparison: Right ankle radiographs performed 05/04/2011  Findings: There is no evidence of fracture or dislocation.  The ankle mortise is intact; the interosseous space is within normal limits.  No talar tilt or subluxation is seen.  The joint spaces are preserved.  No significant soft tissue abnormalities are seen.  IMPRESSION: No evidence of fracture or dislocation.   Original Report Authenticated By: Tonia Ghent, M.D.   IV Dilaudid pain control  Patient stable for discharge home to have DVT study in the morning. Crutches offered.  Prescription for pain medications offered.   MDM  Right lower extremity pain and swelling on birth control pills without PE symptoms  Evaluated with x-ray as above no bony abnormality identified  No fever, erythema, fluctuance or increased warmth on exam to suggest infectious process  Labs obtained and reviewed. Is hypokalemic with potassium provided. Is anemic, patient aware. Creatinine is within normal limits. Patient given Lovenox and DVT study ordered to be done in the vascular lab.  Vital signs and nursing notes reviewed and considered.  Sunnie Nielsen, MD 04/09/13 [phone]

## 2013-04-09 NOTE — ED Notes (Signed)
Pt spitting up potassium pills.  Liquid potassium ordered by EDP.

## 2013-04-09 NOTE — Progress Notes (Signed)
*  PRELIMINARY RESULTS* Vascular Ultrasound Right lower extremity venous duplex has been completed.  Preliminary findings: right = negative for DVT.    Farrel Demark, RDMS, RVT  04/09/2013, 9:09 AM

## 2013-04-09 NOTE — ED Notes (Signed)
Shortly after administration of dilaudid, pt started c/o increase nausea and difficulty breathing.  Pulse ox placed on pt.  Pt sating 98% on RA.  Head of bed raised on pt.  Pt asked by this RN and EDP to take slow, deep breaths.  VSS.

## 2013-04-09 NOTE — ED Notes (Signed)
500 cc NS bolus started on pt per EDP.

## 2013-04-10 ENCOUNTER — Ambulatory Visit (INDEPENDENT_AMBULATORY_CARE_PROVIDER_SITE_OTHER): Payer: 59 | Admitting: Family Medicine

## 2013-04-10 ENCOUNTER — Encounter (HOSPITAL_COMMUNITY): Payer: 59

## 2013-04-10 ENCOUNTER — Encounter: Payer: Self-pay | Admitting: Family Medicine

## 2013-04-10 VITALS — BP 132/80 | HR 93 | Temp 98.1°F | Wt 166.0 lb

## 2013-04-10 DIAGNOSIS — D649 Anemia, unspecified: Secondary | ICD-10-CM

## 2013-04-10 DIAGNOSIS — R609 Edema, unspecified: Secondary | ICD-10-CM

## 2013-04-10 DIAGNOSIS — E876 Hypokalemia: Secondary | ICD-10-CM

## 2013-04-10 DIAGNOSIS — R600 Localized edema: Secondary | ICD-10-CM

## 2013-04-10 DIAGNOSIS — R634 Abnormal weight loss: Secondary | ICD-10-CM

## 2013-04-10 DIAGNOSIS — M2550 Pain in unspecified joint: Secondary | ICD-10-CM

## 2013-04-10 NOTE — Patient Instructions (Addendum)
Foods Rich in Potassium Food / Potassium (mg)  Apricots, dried,  cup / 378 mg   Apricots, raw, 1 cup halves / 401 mg   Avocado,  / 487 mg   Banana, 1 large / 487 mg   Beef, lean, round, 3 oz / 202 mg   Cantaloupe, 1 cup cubes / 427 mg   Dates, medjool, 5 whole / 835 mg   Ham, cured, 3 oz / 212 mg   Lentils, dried,  cup / 458 mg   Lima beans, frozen,  cup / 258 mg   Orange, 1 large / 333 mg   Orange juice, 1 cup / 443 mg   Peaches, dried,  cup / 398 mg   Peas, split, cooked,  cup / 355 mg   Potato, boiled, 1 medium / 515 mg   Prunes, dried, uncooked,  cup / 318 mg   Raisins,  cup / 309 mg   Salmon, pink, raw, 3 oz / 275 mg   Sardines, canned , 3 oz / 338 mg   Tomato, raw, 1 medium / 292 mg   Tomato juice, 6 oz / 417 mg   Turkey, 3 oz / 349 mg  Document Released: 09/10/2005 Document Revised: 05/23/2011 Document Reviewed: 01/24/2009 [employer] Patient Information 2012 [employer]. 

## 2013-04-10 NOTE — Progress Notes (Signed)
  Subjective:    Patient ID: Diane [name], female    DOB: 08-Dec-1980, 32 y.o.   MRN: [id number]  HPI Patient seen for emergent followup. She presented there on 04/08/2013 with right leg edema and some pain mostly involving the ankle region. There was concern for possible DVT. She was given one dose of Lovenox and received venous Doppler the next day which did not show evidence for venous thrombosis. She had plain x-rays of the ankle which showed no fracture or any other acute abnormality.  Lab work significant for potassium 3.0. She's had hypokalemia in the past. No diuretic use. Hemoglobin 10.5. Interestingly, she had recent hemoglobin here of 14.3 just a few months ago. She is having heavy menses and was started on new contraception per gynecologist couple months ago and periods have been diminished since then.  She's lost about 13 pounds since the spring but she attributes some of this weight loss to tonsillectomy surgery. She's had some decreased appetite. Intermittent night sweats. No fever. Intermittent loose stools for a few weeks. History of Crohn's disease. No abdominal pain.  She's not describing any generalized arthralgias currently but has had other joint pains previously. Denies any recent skin rashes. No lymphadenopathy.  Past Medical History  Diagnosis Date  . Crohn's disease   . Pneumonia    Past Surgical History  Procedure Laterality Date  . Tonsillectomy    . Adenoidectomy      reports that she has never smoked. She has never used smokeless tobacco. She reports that she does not drink alcohol or use illicit drugs. family history includes Heart disease (age of onset: 79) in her mother. No Known Allergies    Review of Systems  Constitutional: Positive for appetite change and fatigue. Negative for unexpected weight change.  Eyes: Negative for visual disturbance.  Respiratory: Negative for cough, chest tightness, shortness of breath and wheezing.   Cardiovascular:  Positive for leg swelling. Negative for chest pain and palpitations.  Musculoskeletal: Positive for arthralgias.  Neurological: Negative for dizziness, seizures, syncope, weakness, light-headedness and headaches.       Objective:   Physical Exam  Constitutional: She appears well-developed and well-nourished.  HENT:  Mouth/Throat: Oropharynx is clear and moist.  Neck: Neck supple. No thyromegaly present.  Cardiovascular: Normal rate.   Pulmonary/Chest: Effort normal and breath sounds normal. No respiratory distress. She has no wheezes. She has no rales.  Musculoskeletal:  Patient has some nonpitting edema right lower leg. Most her edema involves the ankle region. No definite warmth. No ecchymosis. No erythema. No localized bony tenderness.  Neurological: She is alert.  Skin: No rash noted.          Assessment & Plan:  #1 asymmetric right ankle and leg edema. Recent Doppler negative for DVT. She's had some similar intermittent edema problems previously. Doubt acute monoarticular inflammatory arthritis  #2 hypokalemia. Chronic and intermittent. No diuretic use. ?related to Crohn's.  Recheck basic metabolic panel. Patient was given one dose of potassium replacement in emergency department.  Need to assess magnesium and 24 hour urine K excretion to assess extrarenal vs renal causes. #3 history of anemia. Recent hemoglobin 10.5 as above. Recheck CBC. Check serum ferritin, TIBC, and iron #4 intermittent arthralgias. Check sed rate and ANA

## 2013-04-13 NOTE — Addendum Note (Signed)
Addended by: Kristian Covey on: 04/13/2013 09:44 PM   Modules accepted: Orders

## 2013-04-14 ENCOUNTER — Telehealth: Payer: Self-pay | Admitting: Family Medicine

## 2013-04-14 NOTE — Telephone Encounter (Signed)
Patient Information:  Caller Name: Diane [name]  Phone: [phone]  Patient: Diane, [name]  Gender: Female  DOB: August 10, 1981  Age: 32 Years  PCP: Carolann Littler (Family Practice)  Pregnant: No  Office Follow Up:  Does the office need to follow up with this patient?: Yes  Instructions For The Office: Requesting lab results - labs done on 04/10/13.  Especially concerned about low Hemoglobin and low Potassium . No results seen in EPIC.  Please call Ayesha Rumpf- Prefers Diane [name]   Symptoms  Reason For Call & Symptoms: Diane [name] states she was seen in office on 04/10/13 for follow up visit after being seen in ED on 04/08/13 due to edema right foot and leg. States she had abnormal Potassium amd HGB  down to 10.5 on 7/16. States she had labs done on 04/10/13 but has not heard from office. Lab Results not seen in EPIC. Please call Diane [name] at [phone]  Reviewed Health History In EMR: Yes  Reviewed Medications In EMR: Yes  Reviewed Allergies In EMR: Yes  Reviewed Surgeries / Procedures: Yes  Date of Onset of Symptoms: 04/07/2013  Treatments Tried: elevation  Treatments Tried Worked: No OB / GYN:  LMP: Unknown  Guideline(s) Used:  No Protocol Available - Sick Adult  Disposition Per Guideline:   Discuss with PCP and Callback by Nurse Today  Reason For Disposition Reached:   Nursing judgment  Advice Given:  Call Back If:  You become worse.  Patient Will Follow Care Advice:  YES

## 2013-04-15 NOTE — Telephone Encounter (Signed)
We need to check with lab to see why her labs not back yet.  l also ordered 2 additional tests-magnesium and 24 hour urine K to sort out her chronic hypokalemia.

## 2013-04-15 NOTE — Telephone Encounter (Signed)
Patient stated she is wanting to know her lab results and that her leg and foot is still swollen.

## 2013-04-15 NOTE — Telephone Encounter (Signed)
Called Gwenn in the lab and stated she would call [facility] and see do still have patient labs.

## 2013-04-15 NOTE — Telephone Encounter (Signed)
Patient had labs sent to [facility]

## 2013-04-16 ENCOUNTER — Other Ambulatory Visit (INDEPENDENT_AMBULATORY_CARE_PROVIDER_SITE_OTHER): Payer: 59

## 2013-04-16 ENCOUNTER — Telehealth: Payer: Self-pay | Admitting: Family Medicine

## 2013-04-16 DIAGNOSIS — R634 Abnormal weight loss: Secondary | ICD-10-CM

## 2013-04-16 DIAGNOSIS — E876 Hypokalemia: Secondary | ICD-10-CM

## 2013-04-16 NOTE — Telephone Encounter (Signed)
11.2-not low enough to likely cause any light headedness.

## 2013-04-16 NOTE — Telephone Encounter (Signed)
Pt informed about results.

## 2013-04-16 NOTE — Telephone Encounter (Signed)
Patient is wanting to know the exact number of hemoglobin and that she is feeling light headed today.

## 2013-04-16 NOTE — Telephone Encounter (Signed)
Patient's lab work received. Her hemoglobin was borderline low but still fell in normal range. Electrolytes including potassium were normal. Anti-nuclear antibody screen was negative. Serum iron levels normal. Comment was made that not enough blood was sent for sedimentation rate.  We could perform sedimentation rate here in this office which would simplify rather than sending out to [facility]. I had suggested also considering addition of magnesium and 24-hour urine potassium levels to assess for her chronic and recurrent hypokalemia

## 2013-04-16 NOTE — Addendum Note (Signed)
Addended by: Rita Ohara R on: 04/16/2013 04:20 PM   Modules accepted: Orders

## 2013-04-27 ENCOUNTER — Telehealth: Payer: Self-pay | Admitting: Family Medicine

## 2013-04-27 NOTE — Telephone Encounter (Signed)
Pt would like blood work results °

## 2013-04-27 NOTE — Telephone Encounter (Signed)
Pt 24hr urine was never picked up.Diane [name] was called when patient dropped of the urine so the urine is no good.

## 2013-04-28 ENCOUNTER — Telehealth: Payer: Self-pay | Admitting: *Deleted

## 2013-04-28 DIAGNOSIS — M2540 Effusion, unspecified joint: Secondary | ICD-10-CM

## 2013-04-28 NOTE — Telephone Encounter (Signed)
Yes.  I would repeat 24 hour urine test and any labs that were ordered and not run.

## 2013-04-28 NOTE — Telephone Encounter (Signed)
Patient is aware of lab results.  However she she would like to know if she should have the 24 hour urine test repeated.  Also she said the lab did not have enough blood for some of the test?  Should these be repeated also?

## 2013-04-28 NOTE — Telephone Encounter (Signed)
Spoke with patient and she is aware of the message.  She would like

## 2013-04-28 NOTE — Telephone Encounter (Signed)
See comments from phone note on 04-27-13

## 2013-04-28 NOTE — Telephone Encounter (Signed)
Patient is requesting lab resutls

## 2013-04-28 NOTE — Telephone Encounter (Signed)
LEFT message for patient to return call

## 2013-04-28 NOTE — Telephone Encounter (Signed)
We have had a VERY difficult time getting labs turned around from [facility].  Per notes, [facility] never picked up her 24 hour urine so this test could not be run. Her potassium and other electrolytes were normal, though I could not find them scanned into computer.  I don't know if there is any way that she can in the future get labs drawn through our lab but this would certainly be way more efficient-and less risk of labs not coming back in timely manner.

## 2013-04-29 NOTE — Telephone Encounter (Signed)
written

## 2013-04-29 NOTE — Telephone Encounter (Signed)
Pt wants to know if we could write on a rx pad for her to get her sedimentation rate and a 24 hr urine so that she can take it to [facility] to get done

## 2013-04-29 NOTE — Addendum Note (Signed)
Addended by: Westley Hummer B on: 04/29/2013 11:32 AM   Modules accepted: Orders

## 2013-04-29 NOTE — Telephone Encounter (Signed)
Faxed over to [facility]

## 2013-05-06 ENCOUNTER — Telehealth: Payer: Self-pay | Admitting: Family Medicine

## 2013-05-06 NOTE — Telephone Encounter (Signed)
Her 24 hr urine and sed rate

## 2013-05-06 NOTE — Telephone Encounter (Signed)
PT requesting lab results from 05/04/13, from [place]. Please assist.

## 2013-05-07 NOTE — Telephone Encounter (Signed)
Pt aware of the results.

## 2013-05-07 NOTE — Telephone Encounter (Signed)
Sed rate was normal.  Makes connective tissue disorder very unlikely. Urine K was slightly low which suggests that her recurrent low K is NOT related to any renal cause

## 2013-05-21 ENCOUNTER — Encounter: Payer: Self-pay | Admitting: Family Medicine

## 2013-05-26 ENCOUNTER — Encounter (HOSPITAL_COMMUNITY): Payer: Self-pay | Admitting: Pharmacist

## 2013-06-01 ENCOUNTER — Other Ambulatory Visit: Payer: Self-pay | Admitting: Obstetrics and Gynecology

## 2013-06-02 ENCOUNTER — Encounter (HOSPITAL_COMMUNITY)
Admission: RE | Admit: 2013-06-02 | Discharge: 2013-06-02 | Disposition: A | Discharge status: Home / Self Care | Payer: 59 | Source: Ambulatory Visit | Attending: Obstetrics and Gynecology | Admitting: Obstetrics and Gynecology

## 2013-06-02 ENCOUNTER — Encounter (HOSPITAL_COMMUNITY): Payer: Self-pay

## 2013-06-02 DIAGNOSIS — Z01818 Encounter for other preprocedural examination: Secondary | ICD-10-CM | POA: Insufficient documentation

## 2013-06-02 DIAGNOSIS — Z01812 Encounter for preprocedural laboratory examination: Secondary | ICD-10-CM | POA: Insufficient documentation

## 2013-06-02 HISTORY — DX: Anemia, unspecified: D64.9

## 2013-06-02 HISTORY — DX: Hypokalemia: E87.6

## 2013-06-02 HISTORY — DX: Other specified postprocedural states: R11.2

## 2013-06-02 HISTORY — DX: Headache: R51

## 2013-06-02 HISTORY — DX: Anxiety disorder, unspecified: F41.9

## 2013-06-02 HISTORY — DX: Cardiac murmur, unspecified: R01.1

## 2013-06-02 HISTORY — DX: Other seasonal allergic rhinitis: J30.2

## 2013-06-02 HISTORY — DX: Nausea with vomiting, unspecified: R11.2

## 2013-06-02 HISTORY — DX: Nausea with vomiting, unspecified: Z98.890

## 2013-06-02 LAB — CBC
HCT: 35.5 % — ABNORMAL LOW (ref 36.0–46.0)
Hemoglobin: 11.9 g/dL — ABNORMAL LOW (ref 12.0–15.0)
MCH: 27.4 pg (ref 26.0–34.0)
MCHC: 33.5 g/dL (ref 30.0–36.0)
MCV: 81.6 fL (ref 78.0–100.0)
Platelets: 261 10*3/uL (ref 150–400)
RBC: 4.35 MIL/uL (ref 3.87–5.11)
RDW: 12.5 % (ref 11.5–15.5)
WBC: 5.9 10*3/uL (ref 4.0–10.5)

## 2013-06-02 LAB — BASIC METABOLIC PANEL
BUN: 10 mg/dL (ref 6–23)
CO2: 30 mEq/L (ref 19–32)
Calcium: 8.7 mg/dL (ref 8.4–10.5)
Chloride: 104 mEq/L (ref 96–112)
Creatinine, Ser: 0.69 mg/dL (ref 0.50–1.10)
GFR calc Af Amer: >90 mL/min (ref >90)
GFR calc non Af Amer: >90 mL/min (ref >90)
Glucose, Bld: 79 mg/dL (ref 70–99)
Potassium: 3.8 mEq/L (ref 3.5–5.1)
Sodium: 142 mEq/L (ref 135–145)

## 2013-06-02 NOTE — Patient Instructions (Addendum)
   Your procedure is scheduled on:  Tuesday, 06/09/13  Enter through the Main Entrance of [facility] at:   1130 am Pick up the phone at the desk and dial [phone] and inform us of your arrival.  Please call this number if you have any problems the morning of surgery: [phone]  Remember: Do not eat food after midnight: Monday Do not drink clear liquids after: 9 am Tuesday, day of surgery Take these medicines the morning of surgery with a SIP OF WATER:   None  Do not wear jewelry, make-up, or FINGER nail polish No metal in your hair or on your body. Do not wear lotions, powders, perfumes. You may wear deodorant.  Please use your CHG wash as directed prior to surgery.  Do not shave anywhere for at least 12 hours prior to first CHG shower.  Do not bring valuables to the hospital. Contacts, dentures or bridgework may not be worn into surgery.  Leave suitcase in the car. After Surgery it may be brought to your room. For patients being admitted to the hospital, checkout time is 11:00am the day of discharge.  Patients discharged on the day of surgery will not be allowed to drive home.  Home with mother Diane [name].

## 2013-06-09 ENCOUNTER — Encounter (HOSPITAL_COMMUNITY): Payer: Self-pay | Admitting: Certified Registered Nurse Anesthetist

## 2013-06-09 ENCOUNTER — Encounter (HOSPITAL_COMMUNITY): Payer: Self-pay | Admitting: *Deleted

## 2013-06-09 ENCOUNTER — Ambulatory Visit (HOSPITAL_COMMUNITY)
Admission: RE | Admit: 2013-06-09 | Discharge: 2013-06-09 | Disposition: A | Discharge status: Home / Self Care | Payer: 59 | Source: Ambulatory Visit | Attending: Obstetrics and Gynecology | Admitting: Obstetrics and Gynecology

## 2013-06-09 ENCOUNTER — Ambulatory Visit (HOSPITAL_COMMUNITY): Payer: 59 | Admitting: Certified Registered Nurse Anesthetist

## 2013-06-09 ENCOUNTER — Encounter (HOSPITAL_COMMUNITY)
Admission: RE | Disposition: A | Discharge status: Home / Self Care | Payer: Self-pay | Source: Ambulatory Visit | Attending: Obstetrics and Gynecology

## 2013-06-09 DIAGNOSIS — N803 Endometriosis of pelvic peritoneum, unspecified: Secondary | ICD-10-CM | POA: Insufficient documentation

## 2013-06-09 DIAGNOSIS — N946 Dysmenorrhea, unspecified: Secondary | ICD-10-CM | POA: Insufficient documentation

## 2013-06-09 DIAGNOSIS — N840 Polyp of corpus uteri: Secondary | ICD-10-CM | POA: Insufficient documentation

## 2013-06-09 DIAGNOSIS — N938 Other specified abnormal uterine and vaginal bleeding: Secondary | ICD-10-CM | POA: Insufficient documentation

## 2013-06-09 DIAGNOSIS — N736 Female pelvic peritoneal adhesions (postinfective): Secondary | ICD-10-CM | POA: Insufficient documentation

## 2013-06-09 DIAGNOSIS — N730 Acute parametritis and pelvic cellulitis: Secondary | ICD-10-CM

## 2013-06-09 DIAGNOSIS — N949 Unspecified condition associated with female genital organs and menstrual cycle: Secondary | ICD-10-CM | POA: Insufficient documentation

## 2013-06-09 DIAGNOSIS — D250 Submucous leiomyoma of uterus: Secondary | ICD-10-CM

## 2013-06-09 HISTORY — PX: ROBOTIC ASSISTED LAPAROSCOPIC LYSIS OF ADHESION: SHX6080

## 2013-06-09 HISTORY — PX: LAPAROSCOPY: SHX197

## 2013-06-09 HISTORY — PX: DILATATION & CURRETTAGE/HYSTEROSCOPY WITH RESECTOCOPE: SHX5572

## 2013-06-09 SURGERY — DILATATION & CURETTAGE/HYSTEROSCOPY WITH RESECTOCOPE
Anesthesia: General | Site: Vagina | Wound class: Clean Contaminated

## 2013-06-09 MED ORDER — GLYCINE 1.5 % IR SOLN
Status: DC | PRN
  Administered 2013-06-09 (×2): 3000 mL

## 2013-06-09 MED ORDER — BUPIVACAINE HCL (PF) 0.25 % IJ SOLN
INTRAMUSCULAR | Status: DC | PRN
  Administered 2013-06-09: 10 mL

## 2013-06-09 MED ORDER — FENTANYL CITRATE 0.05 MG/ML IJ SOLN
INTRAMUSCULAR | Status: DC | PRN
  Administered 2013-06-09 (×2): 100 ug via INTRAVENOUS
  Administered 2013-06-09: 50 ug via INTRAVENOUS

## 2013-06-09 MED ORDER — DEXTROSE 5 % IV SOLN
INTRAVENOUS | Status: DC | PRN
  Administered 2013-06-09: 14:00:00 via INTRAVENOUS

## 2013-06-09 MED ORDER — LIDOCAINE HCL (CARDIAC) 20 MG/ML IV SOLN
INTRAVENOUS | Status: AC
  Filled 2013-06-09: qty 5

## 2013-06-09 MED ORDER — INDIGOTINDISULFONATE SODIUM 8 MG/ML IJ SOLN
INTRAMUSCULAR | Status: AC
  Filled 2013-06-09: qty 5

## 2013-06-09 MED ORDER — HYDROMORPHONE HCL 4 MG PO TABS: 4.0000 mg | ORAL_TABLET | Freq: Four times a day (QID) | ORAL | Status: DC | PRN

## 2013-06-09 MED ORDER — GLYCOPYRROLATE 0.2 MG/ML IJ SOLN
INTRAMUSCULAR | Status: AC
  Filled 2013-06-09: qty 3

## 2013-06-09 MED ORDER — PROMETHAZINE HCL 25 MG/ML IJ SOLN
6.2500 mg | INTRAMUSCULAR | Status: DC | PRN
  Administered 2013-06-09: 6.25 mg via INTRAVENOUS

## 2013-06-09 MED ORDER — FENTANYL CITRATE 0.05 MG/ML IJ SOLN
INTRAMUSCULAR | Status: AC
  Filled 2013-06-09: qty 2

## 2013-06-09 MED ORDER — KETOROLAC TROMETHAMINE 30 MG/ML IJ SOLN: 15.0000 mg | Freq: Once | INTRAMUSCULAR | Status: DC | PRN

## 2013-06-09 MED ORDER — BUPIVACAINE HCL (PF) 0.25 % IJ SOLN
INTRAMUSCULAR | Status: AC
  Filled 2013-06-09: qty 30

## 2013-06-09 MED ORDER — PROPOFOL 10 MG/ML IV EMUL
INTRAVENOUS | Status: AC
  Filled 2013-06-09: qty 20

## 2013-06-09 MED ORDER — MIDAZOLAM HCL 2 MG/2ML IJ SOLN
INTRAMUSCULAR | Status: DC | PRN
  Administered 2013-06-09: .5 mg via INTRAVENOUS
  Administered 2013-06-09: 1.5 mg via INTRAVENOUS

## 2013-06-09 MED ORDER — FENTANYL CITRATE 0.05 MG/ML IJ SOLN
INTRAMUSCULAR | Status: AC
  Filled 2013-06-09: qty 5

## 2013-06-09 MED ORDER — ROCURONIUM BROMIDE 50 MG/5ML IV SOLN
INTRAVENOUS | Status: AC
  Filled 2013-06-09: qty 1

## 2013-06-09 MED ORDER — SCOPOLAMINE 1 MG/3DAYS TD PT72
MEDICATED_PATCH | TRANSDERMAL | Status: AC
  Administered 2013-06-09: 1 via TRANSDERMAL
  Filled 2013-06-09: qty 1

## 2013-06-09 MED ORDER — ROCURONIUM BROMIDE 100 MG/10ML IV SOLN
INTRAVENOUS | Status: DC | PRN
  Administered 2013-06-09: 40 mg via INTRAVENOUS
  Administered 2013-06-09: 10 mg via INTRAVENOUS

## 2013-06-09 MED ORDER — KETOROLAC TROMETHAMINE 30 MG/ML IJ SOLN
INTRAMUSCULAR | Status: AC
  Filled 2013-06-09: qty 1

## 2013-06-09 MED ORDER — MEPERIDINE HCL 25 MG/ML IJ SOLN: 6.2500 mg | INTRAMUSCULAR | Status: DC | PRN

## 2013-06-09 MED ORDER — FENTANYL CITRATE 0.05 MG/ML IJ SOLN
25.0000 ug | INTRAMUSCULAR | Status: DC | PRN
  Administered 2013-06-09: 25 ug via INTRAVENOUS

## 2013-06-09 MED ORDER — LIDOCAINE HCL (CARDIAC) 20 MG/ML IV SOLN
INTRAVENOUS | Status: DC | PRN
  Administered 2013-06-09: 60 mg via INTRAVENOUS

## 2013-06-09 MED ORDER — NEOSTIGMINE METHYLSULFATE 1 MG/ML IJ SOLN
INTRAMUSCULAR | Status: AC
  Filled 2013-06-09: qty 1

## 2013-06-09 MED ORDER — HYDROMORPHONE HCL PF 1 MG/ML IJ SOLN
INTRAMUSCULAR | Status: DC | PRN
  Administered 2013-06-09: 1 mg via INTRAVENOUS

## 2013-06-09 MED ORDER — CHLOROPROCAINE HCL 1 % IJ SOLN
INTRAMUSCULAR | Status: AC
  Filled 2013-06-09: qty 30

## 2013-06-09 MED ORDER — PROMETHAZINE HCL 25 MG/ML IJ SOLN
INTRAMUSCULAR | Status: AC
  Filled 2013-06-09: qty 1

## 2013-06-09 MED ORDER — ONDANSETRON HCL 4 MG/2ML IJ SOLN
INTRAMUSCULAR | Status: DC | PRN
  Administered 2013-06-09 (×2): 2 mg via INTRAVENOUS

## 2013-06-09 MED ORDER — KETOROLAC TROMETHAMINE 30 MG/ML IJ SOLN
INTRAMUSCULAR | Status: DC | PRN
  Administered 2013-06-09: 30 mg via INTRAMUSCULAR
  Administered 2013-06-09: 30 mg via INTRAVENOUS

## 2013-06-09 MED ORDER — CHLOROPROCAINE HCL 1 % IJ SOLN
INTRAMUSCULAR | Status: DC | PRN
  Administered 2013-06-09: 20 mL

## 2013-06-09 MED ORDER — CEFTRIAXONE SODIUM 1 G IJ SOLR
1.0000 g | Freq: Once | INTRAMUSCULAR | Status: AC
  Administered 2013-06-09: 1 g via INTRAMUSCULAR
  Filled 2013-06-09: qty 10

## 2013-06-09 MED ORDER — ONDANSETRON HCL 4 MG/2ML IJ SOLN
INTRAMUSCULAR | Status: AC
  Filled 2013-06-09: qty 2

## 2013-06-09 MED ORDER — DEXAMETHASONE SODIUM PHOSPHATE 10 MG/ML IJ SOLN
INTRAMUSCULAR | Status: DC | PRN
  Administered 2013-06-09: 10 mg via INTRAVENOUS

## 2013-06-09 MED ORDER — GLYCOPYRROLATE 0.2 MG/ML IJ SOLN
INTRAMUSCULAR | Status: DC | PRN
  Administered 2013-06-09: 0.4 mg via INTRAVENOUS
  Administered 2013-06-09 (×2): 0.1 mg via INTRAVENOUS

## 2013-06-09 MED ORDER — LACTATED RINGERS IV SOLN
INTRAVENOUS | Status: DC
  Administered 2013-06-09 (×2): via INTRAVENOUS

## 2013-06-09 MED ORDER — ARTIFICIAL TEARS OP OINT
TOPICAL_OINTMENT | OPHTHALMIC | Status: AC
  Filled 2013-06-09: qty 3.5

## 2013-06-09 MED ORDER — DOXYCYCLINE HYCLATE 100 MG IV SOLR
100.0000 mg | Freq: Once | INTRAVENOUS | Status: AC
  Administered 2013-06-09: 100 mg via INTRAVENOUS
  Filled 2013-06-09: qty 100

## 2013-06-09 MED ORDER — CEFTRIAXONE SODIUM 1 G IJ SOLR: 1.0000 g | Freq: Once | INTRAMUSCULAR | Status: DC

## 2013-06-09 MED ORDER — MIDAZOLAM HCL 2 MG/2ML IJ SOLN
INTRAMUSCULAR | Status: AC
  Filled 2013-06-09: qty 2

## 2013-06-09 MED ORDER — HYDROMORPHONE HCL PF 1 MG/ML IJ SOLN
INTRAMUSCULAR | Status: AC
  Filled 2013-06-09: qty 1

## 2013-06-09 MED ORDER — NEOSTIGMINE METHYLSULFATE 1 MG/ML IJ SOLN
INTRAMUSCULAR | Status: DC | PRN
  Administered 2013-06-09: 3 mg via INTRAVENOUS

## 2013-06-09 MED ORDER — DOXYCYCLINE HYCLATE 50 MG PO CAPS: 100.0000 mg | ORAL_CAPSULE | Freq: Two times a day (BID) | ORAL | Status: DC

## 2013-06-09 MED ORDER — MIDAZOLAM HCL 2 MG/2ML IJ SOLN: 0.5000 mg | Freq: Once | INTRAMUSCULAR | Status: DC | PRN

## 2013-06-09 MED ORDER — PROPOFOL 10 MG/ML IV BOLUS
INTRAVENOUS | Status: DC | PRN
  Administered 2013-06-09: 180 mg via INTRAVENOUS

## 2013-06-09 MED ORDER — DEXAMETHASONE SODIUM PHOSPHATE 10 MG/ML IJ SOLN
INTRAMUSCULAR | Status: AC
  Filled 2013-06-09: qty 1

## 2013-06-09 MED ORDER — LACTATED RINGERS IR SOLN
Status: DC | PRN
  Administered 2013-06-09: 3000 mL

## 2013-06-09 SURGICAL SUPPLY — 80 items
ADH SKN CLS APL DERMABOND .7 (GAUZE/BANDAGES/DRESSINGS) ×2
APPLICATOR COTTON TIP 6IN STRL (MISCELLANEOUS) ×3 IMPLANT
BAG URINE DRAINAGE (UROLOGICAL SUPPLIES) ×3 IMPLANT
BARRIER ADHS 3X4 INTERCEED (GAUZE/BANDAGES/DRESSINGS) ×3 IMPLANT
BRR ADH 4X3 ABS CNTRL BYND (GAUZE/BANDAGES/DRESSINGS) ×2
CABLE HIGH FREQUENCY MONO STRZ (ELECTRODE) IMPLANT
CANISTER SUCTION 2500CC (MISCELLANEOUS) ×3 IMPLANT
CATH FOLEY 3WAY  5CC 16FR (CATHETERS) ×1
CATH FOLEY 3WAY 5CC 16FR (CATHETERS) ×2 IMPLANT
CATH ROBINSON RED A/P 16FR (CATHETERS) ×3 IMPLANT
CHLORAPREP W/TINT 26ML (MISCELLANEOUS) ×3 IMPLANT
CLOTH BEACON ORANGE TIMEOUT ST (SAFETY) ×3 IMPLANT
CONT PATH 16OZ SNAP LID 3702 (MISCELLANEOUS) ×3 IMPLANT
CONTAINER PREFILL 10% NBF 60ML (FORM) ×6 IMPLANT
COVER MAYO STAND STRL (DRAPES) ×3 IMPLANT
COVER TABLE BACK 60X90 (DRAPES) ×6 IMPLANT
COVER TIP SHEARS 8 DVNC (MISCELLANEOUS) ×2 IMPLANT
COVER TIP SHEARS 8MM DA VINCI (MISCELLANEOUS) ×1
DECANTER SPIKE VIAL GLASS SM (MISCELLANEOUS) ×3 IMPLANT
DERMABOND ADVANCED (GAUZE/BANDAGES/DRESSINGS) ×1
DERMABOND ADVANCED .7 DNX12 (GAUZE/BANDAGES/DRESSINGS) ×2 IMPLANT
DEVICE TROCAR PUNCTURE CLOSURE (ENDOMECHANICALS) IMPLANT
DRAPE HUG U DISPOSABLE (DRAPE) ×3 IMPLANT
DRAPE LG THREE QUARTER DISP (DRAPES) ×6 IMPLANT
DRAPE WARM FLUID 44X44 (DRAPE) ×3 IMPLANT
DRESSING TELFA 8X3 (GAUZE/BANDAGES/DRESSINGS) ×3 IMPLANT
ELECT LIGASURE LONG (ELECTRODE) IMPLANT
ELECT REM PT RETURN 9FT ADLT (ELECTROSURGICAL) ×3
ELECTRODE REM PT RTRN 9FT ADLT (ELECTROSURGICAL) ×2 IMPLANT
ELECTRODE ROLLER VERSAPOINT (ELECTRODE) IMPLANT
ELECTRODE RT ANGLE VERSAPOINT (CUTTING LOOP) IMPLANT
EVACUATOR SMOKE 8.L (FILTER) ×3 IMPLANT
FORCEPS CUTTING 33CM 5MM (CUTTING FORCEPS) IMPLANT
FORCEPS CUTTING 45CM 5MM (CUTTING FORCEPS) IMPLANT
GAUZE VASELINE 3X9 (GAUZE/BANDAGES/DRESSINGS) IMPLANT
GLOVE BIO SURGEON STRL SZ 6.5 (GLOVE) ×3 IMPLANT
GLOVE BIOGEL PI IND STRL 7.0 (GLOVE) ×4 IMPLANT
GLOVE BIOGEL PI INDICATOR 7.0 (GLOVE) ×2
GOWN PREVENTION PLUS LG XLONG (DISPOSABLE) ×9 IMPLANT
GOWN STRL REIN XL XLG (GOWN DISPOSABLE) ×18 IMPLANT
KIT ACCESSORY DA VINCI DISP (KITS) ×1
KIT ACCESSORY DVNC DISP (KITS) ×2 IMPLANT
LEGGING LITHOTOMY PAIR STRL (DRAPES) ×3 IMPLANT
LOOP ANGLED CUTTING 22FR (CUTTING LOOP) ×1 IMPLANT
NEEDLE INSUFFLATION 120MM (ENDOMECHANICALS) ×3 IMPLANT
NS IRRIG 1000ML POUR BTL (IV SOLUTION) ×9 IMPLANT
OCCLUDER COLPOPNEUMO (BALLOONS) ×1 IMPLANT
PACK HYSTEROSCOPY LF (CUSTOM PROCEDURE TRAY) ×3 IMPLANT
PACK LAPAROSCOPY BASIN (CUSTOM PROCEDURE TRAY) ×3 IMPLANT
PACK LAVH (CUSTOM PROCEDURE TRAY) ×3 IMPLANT
PAD OB MATERNITY 4.3X12.25 (PERSONAL CARE ITEMS) ×3 IMPLANT
PAD PREP 24X48 CUFFED NSTRL (MISCELLANEOUS) ×6 IMPLANT
PLUG CATH AND CAP STER (CATHETERS) ×3 IMPLANT
PROTECTOR NERVE ULNAR (MISCELLANEOUS) ×6 IMPLANT
SCISSORS LAP 5X35 DISP (ENDOMECHANICALS) IMPLANT
SET CYSTO W/LG BORE CLAMP LF (SET/KITS/TRAYS/PACK) ×1 IMPLANT
SET IRRIG TUBING LAPAROSCOPIC (IRRIGATION / IRRIGATOR) ×3 IMPLANT
SOLUTION ELECTROLUBE (MISCELLANEOUS) ×3 IMPLANT
SUT VIC AB 0 CT1 27 (SUTURE) ×15
SUT VIC AB 0 CT1 27XBRD ANTBC (SUTURE) ×10 IMPLANT
SUT VICRYL 0 UR6 27IN ABS (SUTURE) ×3 IMPLANT
SUT VICRYL 4-0 PS2 18IN ABS (SUTURE) ×6 IMPLANT
SYR 50ML LL SCALE MARK (SYRINGE) ×3 IMPLANT
SYSTEM CONVERTIBLE TROCAR (TROCAR) IMPLANT
TIP UTERINE 5.1X6CM LAV DISP (MISCELLANEOUS) IMPLANT
TIP UTERINE 6.7X10CM GRN DISP (MISCELLANEOUS) IMPLANT
TIP UTERINE 6.7X6CM WHT DISP (MISCELLANEOUS) IMPLANT
TIP UTERINE 6.7X8CM BLUE DISP (MISCELLANEOUS) IMPLANT
TOWEL OR 17X24 6PK STRL BLUE (TOWEL DISPOSABLE) ×9 IMPLANT
TROCAR 12M 150ML BLUNT (TROCAR) ×1 IMPLANT
TROCAR BALLN 12MMX100 BLUNT (TROCAR) IMPLANT
TROCAR DISP BLADELESS 8 DVNC (TROCAR) IMPLANT
TROCAR DISP BLADELESS 8MM (TROCAR) ×1
TROCAR OPTI TIP 12M 100M (ENDOMECHANICALS) ×3 IMPLANT
TROCAR OPTI TIP 5M 100M (ENDOMECHANICALS) ×6 IMPLANT
TROCAR XCEL DIL TIP R 11M (ENDOMECHANICALS) ×3 IMPLANT
TROCAR Z-THREAD 12X150 (TROCAR) ×3 IMPLANT
TUBING FILTER THERMOFLATOR (ELECTROSURGICAL) ×3 IMPLANT
WARMER LAPAROSCOPE (MISCELLANEOUS) ×3 IMPLANT
WATER STERILE IRR 1000ML POUR (IV SOLUTION) ×3 IMPLANT

## 2013-06-09 NOTE — Anesthesia Preprocedure Evaluation (Signed)
Anesthesia Evaluation  Patient identified by MRN, date of birth, ID band Patient awake    Reviewed: Allergy & Precautions, H&P , Patient's Chart, lab work & pertinent test results, reviewed documented beta blocker date and time   History of Anesthesia Complications (+) PONV  Airway Mallampati: II TM Distance: >3 FB Neck ROM: full    Dental no notable dental hx.    Pulmonary neg pulmonary ROS, pneumonia -, resolved,  breath sounds clear to auscultation  Pulmonary exam normal       Cardiovascular Exercise Tolerance: Good negative cardio ROS  Rhythm:regular Rate:Normal     Neuro/Psych  Headaches, PSYCHIATRIC DISORDERS Anxiety negative neurological ROS  negative psych ROS   GI/Hepatic negative GI ROS, Neg liver ROS, GERD-  Controlled,  Endo/Other  negative endocrine ROS  Renal/GU negative Renal ROS     Musculoskeletal   Abdominal   Peds  Hematology negative hematology ROS (+) anemia ,   Anesthesia Other Findings Crohn's disease  Reproductive/Obstetrics negative OB ROS                           Anesthesia Physical Anesthesia Plan  ASA: II  Anesthesia Plan: General ETT   Post-op Pain Management:    Induction:   Airway Management Planned:   Additional Equipment:   Intra-op Plan:   Post-operative Plan:   Informed Consent: I have reviewed the patients History and Physical, chart, labs and discussed the procedure including the risks, benefits and alternatives for the proposed anesthesia with the patient or authorized representative who has indicated his/her understanding and acceptance.   Dental Advisory Given  Plan Discussed with: CRNA and Surgeon  Anesthesia Plan Comments:         Anesthesia Quick Evaluation

## 2013-06-09 NOTE — Anesthesia Postprocedure Evaluation (Signed)
Anesthesia Post Note  Patient: Oval Linsey  Procedure(s) Performed: Procedure(s) (LRB): DILATATION & CURETTAGE/HYSTEROSCOPY WITH HYSTEROSCOPIC RESECTION OF SUBMUCOSAL FIBROID AND ENDOMETRIAL POLYP (N/A) LAPAROSCOPY DIAGNOSTIC (N/A) ROBOTIC ASSISTED LAPAROSCOPIC LYSIS OF ADHESION; Resection of Endometriosis and excision of fibroid (N/A)  Anesthesia type: GA  Patient location: PACU  Post pain: Pain level controlled  Post assessment: Post-op Vital signs reviewed  Last Vitals:  Filed Vitals:   06/09/13 1545  BP: 147/87  Pulse: 77  Temp:   Resp: 16    Post vital signs: Reviewed  Level of consciousness: sedated  Complications: No apparent anesthesia complications

## 2013-06-09 NOTE — Anesthesia Procedure Notes (Addendum)
Procedure Name: Intubation Date/Time: 06/09/2013 12:54 PM Performed by: Harriett Rush, Jaima Janney A Pre-anesthesia Checklist: Patient identified, Emergency Drugs available, Suction available and Patient being monitored Patient Re-evaluated:Patient Re-evaluated prior to inductionOxygen Delivery Method: Circle system utilized Preoxygenation: Pre-oxygenation with 100% oxygen Intubation Type: IV induction Ventilation: Mask ventilation without difficulty Laryngoscope Size: Miller and 2 Grade View: Grade I Tube type: Oral Tube size: 7.0 mm Number of attempts: 1 Placement Confirmation: ETT inserted through vocal cords under direct vision,  positive ETCO2 and breath sounds checked- equal and bilateral Secured at: 22 cm Tube secured with: Tape Dental Injury: Teeth and Oropharynx as per pre-operative assessment

## 2013-06-09 NOTE — Brief Op Note (Signed)
06/09/2013  3:14 PM  PATIENT:  Diane [name]  32 y.o. female  PRE-OPERATIVE DIAGNOSIS:  Dysfunctional Uterine Bleeding, Dysmenorrhea    POST-OPERATIVE DIAGNOSIS:  dysmenorrhea, fibroid uterus, chronic PID, stage 1 pelvic endometriosis, endometrial polyp, submucosal fibroid, dysfunctional uterine bleeding  PROCEDURE:  Procedure(s): DILATATION & CURETTAGE/HYSTEROSCOPY WITH HYSTEROSCOPIC RESECTION OF SUBMUCOSAL FIBROID AND ENDOMETRIAL POLYP (N/A) LAPAROSCOPY DIAGNOSTIC (N/A) ROBOTIC ASSISTED LAPAROSCOPIC LYSIS OF ADHESION; Resection of Endometriosis and excision of fibroid (N/A)  SURGEON:  Surgeon(s) and Role:    * [address] Clint Bolder, MD - Primary  PHYSICIAN ASSISTANT:   ASSISTANTS: Artelia Laroche, CNM( robotic)   ANESTHESIA:   general and paracervical block Findings:  Subdiaphragmatic adhesions c/w Fitz-Hugh Curtis, periovarian adhesion , posterior adhesions, pedunculated/subserosal fibroid, nl ovaries, nl tubes, endometriotic implants x 2, nl appendix EBL:  Total I/O In: 700 [I.V.:700] Out: 180 [Urine:130; Blood:50]  BLOOD ADMINISTERED:none  DRAINS: none   LOCAL MEDICATIONS USED:  OTHER nesicaine  SPECIMEN:  Source of Specimen:  submucosal fibroid resection, polyp, emc; fibroid, left ovarian fossa peritoneal endometriosis, left uterosacral ligament endometriosis  DISPOSITION OF SPECIMEN:  PATHOLOGY  COUNTS:  YES  TOURNIQUET:  * No tourniquets in log *  DICTATION: .Other Dictation: Dictation Number [id number]  PLAN OF CARE: Discharge to home after PACU  PATIENT DISPOSITION:  PACU - hemodynamically stable.   Delay start of Pharmacological VTE agent (>24hrs) due to surgical blood loss or risk of bleeding: no

## 2013-06-09 NOTE — Transfer of Care (Signed)
Immediate Anesthesia Transfer of Care Note  Patient: Oval Linsey  Procedure(s) Performed: Procedure(s): DILATATION & CURETTAGE/HYSTEROSCOPY WITH HYSTEROSCOPIC RESECTION OF SUBMUCOSAL FIBROID AND ENDOMETRIAL POLYP (N/A) LAPAROSCOPY DIAGNOSTIC (N/A) ROBOTIC ASSISTED LAPAROSCOPIC LYSIS OF ADHESION; Resection of Endometriosis and excision of fibroid (N/A)  Patient Location: PACU  Anesthesia Type:General  Level of Consciousness: awake, alert , oriented and patient cooperative  Airway & Oxygen Therapy: Patient Spontanous Breathing and Patient connected to nasal cannula oxygen  Post-op Assessment: Report given to PACU RN, Post -op Vital signs reviewed and stable and Patient moving all extremities X 4  Post vital signs: Reviewed and stable  Complications: No apparent anesthesia complications

## 2013-06-10 ENCOUNTER — Encounter (HOSPITAL_COMMUNITY): Payer: Self-pay | Admitting: Obstetrics and Gynecology

## 2013-06-10 NOTE — Op Note (Signed)
Diane [name], Diane [name]               ACCOUNT NO.:  [id number]  MEDICAL RECORD NO.:  [id number]  LOCATION:  [facility]                          FACILITY:  [facility]  PHYSICIAN:  Servando Salina, M.D.DATE OF BIRTH:  08/22/1981  DATE OF PROCEDURE:  06/09/2013 DATE OF DISCHARGE:  06/09/2013                              OPERATIVE REPORT   PREOPERATIVE DIAGNOSES: 1. Dysmenorrhea. 2. Dysfunctional uterine bleeding.  PROCEDURE: 1. Diagnostic hysteroscopy. 2. Hysteroscopic resection of endometrial polyp and submucosal     fibroids. 3. Dilation and curettage. 4. Da Vinci robotic-assisted diagnostic laparoscopy. 5. Laparoscopic extensive lysis of adhesions. 6. Excision of pelvic endometriosis. 7. Excision of uterine fibroids.  POSTOPERATIVE DIAGNOSES: 1. Fibroid uterus. 2. Dysmenorrhea. 3. Dysfunctional uterine bleeding. 4. Submucosal fibroids. 5. Endometrial polyps. 6. Stage I pelvic endometriosis. 7. Old pelvic inflammatory disease.  ANESTHESIA:  General.  SURGEON:  Servando Salina, M.D.  ASSISTANT:  Artelia Laroche, C.N.M. (robotic surgery).  PROCEDURE:  Under adequate general anesthesia, the patient was placed in a dorsal lithotomy position.  She was positioned for robotic surgery. She was then sterilely prepped and draped in usual fashion.  An indwelling Foley catheter was sterilely placed.  Examination under anesthesia revealed a slightly irregular uterus.  No adnexal masses could be appreciated.  Bivalve speculum was placed in the vagina. Single-tooth tenaculum was placed on the anterior lip of the cervix.  20 mL of 1% Nesacaine was injected paracervically at 3 and 9 o'clock position.  The cervix was then dilated up to #27 [facility] dilator.  A diagnostic hysteroscope was introduced into the uterine cavity without incident.  There was a polypoid lesion noted at the left fundal posterior aspect of the uterus and on the posterior uterus, there was a submucosal fibroid noted.  No other  lesions were seen.  The endocervical canal was without any lesions.  The diagnostic hysteroscope was removed. The cervix was further dilated and a resectoscope with a single loop was introduced into the uterine cavity.  The polyp was removed.  The submucosal fibroid was resected.  The resectoscope was removed.  The piece was removed and the cavity was also curetted for moderate amount of tissue.  Once this was done, uterus which sounded to 9 cm had a #8 mm uterine manipulator introduced into the uterus without difficulty, and the single-tooth tenaculum was removed.  Bivalve speculum was also removed.  Attention was then turned to the abdomen where 0.25% Marcaine was injected supraumbilically.  A small supraumbilical incision was then made.  Veress needle was introduced.  Opening pressure of 7 was noted. Ultimately 3 L of CO2 was insufflated.  Veress needle was then removed. A 12-mm disposable trocar was introduced into the abdomen without incident.  The robotic camera was then placed through that port.  On immediate inspection, there was no direct trauma to the abdomen on entry.  Upper abdomen was notable for subdiaphragmatic adhesions consistent with Fitz-Hugh-Curtis.  The pelvis was inspected and bowels noted with manipulation of the uterus, and the patient being placed in Trendelenburg position.  It was then noted that the patient had a pelvis that showed evidence of adhesions surrounding the fundus posteriorly from the back of the uterus to  the cul-de-sac around the adnexa particularly the ovaries on both sides and on the pelvic sidewall with adhesions as well.  There was a small glimpse of endometriosis.  At that point, decision was made to proceed with rest of the surgery from the robotic standpoint.  The right and left lower quadrant 8 mm port sites were placed under direct visualization and in the right upper quadrant assistant port was placed.  The robot was then docked to the  patient's left.  In arm #1, the monopolar scissor was placed and arm #2 the PK dissector.  At that point, I went to the surgical console.  At the surgical console, additional pictures were taken of the extent of the patient's evidence of prior pelvic inflammatory disease.  The ovary with the exception of the adhesions was otherwise normal so was the appendix as well as the tube.  There was a fundal fibroid noted, slightly exophytic.  Procedure was started with the adhesions in the posterior as partially attempting to obliterate the posterior cul-de-sac was lysed and the adhesions actually removed.  Some of those adhesions were attached to rectosigmoid area and were removed.  Next was the adhesions on both ovaries, which were both also lysed and removed.  Adhesions over the fundus was also lysed and removed.  Fundal fibroid was grasped with a PK dissector and cauterized subsequently removing the fibroid, cut in half, and brought out through the 8 mm port site.  Both ureters were noted to be peristalsing.  It was then noted that there was 2 satellite lesions of endometriosis, one in the left ovarian fossa away from the ureter and then one on the left uterosacral ligament.  Using the monopolar scissors, both lesions were carefully excised and removed.  The abdomen was irrigated and suctioned.  The anterior cul-de-sac was without any lesions.  Intraoperatively, the patient was given intravenous doxycycline once the diagnosis of prior adhesions consistent with pelvic inflammatory disease was noted.  The abdomen was then irrigated and suctioned with good hemostasis achieved throughout.  The procedure was then robotically terminated.  The arm #1 was undocked and using tenaculum the fibroid pieces were removed.  The arm #2 instrument was removed.  The robot was undocked and I then went back to the patient's bedside.  At the bedside, the pelvis again was irrigated, suctioned, pelvis inspected and  good hemostasis was achieved.  At that point, the lower port sites were removed under direct visualization and supraumbilical site was then removed as well taking care to bring up any underlying structures.  The fascial stitch of 0-Vicryl figure-of-eight was done at the supraumbilical site.  The remaining incisions were closed with 4-0 Vicryl subcuticular closures and Dermabond.  Uterine manipulator had been removed once the surgery was completed.  Specimen was the submucosal fibroid, endometrial polyp, and endometrial curettings.  Second set of pathology was uterine fibroids, left uterosacral ligament endometriosis, left ovarian fossa endometriosis; all of which was sent to Pathology.  ESTIMATED BLOOD LOSS:  Minimal.  COMPLICATIONS:  None.  The patient tolerated procedure well, was transferred to recovery in stable condition.     Servando Salina, M.D.     [place]/MEDQ  D:  06/09/2013  T:  06/10/2013  Job:  [id number]

## 2013-06-12 ENCOUNTER — Encounter (HOSPITAL_COMMUNITY): Payer: Self-pay | Admitting: *Deleted

## 2013-06-12 ENCOUNTER — Inpatient Hospital Stay (HOSPITAL_COMMUNITY)
Admission: AD | Admit: 2013-06-12 | Discharge: 2013-06-12 | Disposition: A | Discharge status: Home / Self Care | Payer: 59 | Source: Ambulatory Visit | Attending: Obstetrics and Gynecology | Admitting: Obstetrics and Gynecology

## 2013-06-12 DIAGNOSIS — K5080 Crohn's disease of both small and large intestine without complications: Secondary | ICD-10-CM

## 2013-06-12 DIAGNOSIS — K625 Hemorrhage of anus and rectum: Secondary | ICD-10-CM

## 2013-06-12 DIAGNOSIS — K219 Gastro-esophageal reflux disease without esophagitis: Secondary | ICD-10-CM

## 2013-06-12 DIAGNOSIS — K5090 Crohn's disease, unspecified, without complications: Secondary | ICD-10-CM

## 2013-06-12 DIAGNOSIS — R42 Dizziness and giddiness: Secondary | ICD-10-CM | POA: Insufficient documentation

## 2013-06-12 DIAGNOSIS — N920 Excessive and frequent menstruation with regular cycle: Secondary | ICD-10-CM | POA: Diagnosis present

## 2013-06-12 DIAGNOSIS — R197 Diarrhea, unspecified: Secondary | ICD-10-CM

## 2013-06-12 DIAGNOSIS — E876 Hypokalemia: Secondary | ICD-10-CM

## 2013-06-12 DIAGNOSIS — IMO0002 Reserved for concepts with insufficient information to code with codable children: Secondary | ICD-10-CM | POA: Insufficient documentation

## 2013-06-12 DIAGNOSIS — R1031 Right lower quadrant pain: Secondary | ICD-10-CM

## 2013-06-12 DIAGNOSIS — K5909 Other constipation: Secondary | ICD-10-CM

## 2013-06-12 LAB — CBC
HCT: 33.3 % — ABNORMAL LOW (ref 36.0–46.0)
Hemoglobin: 11.3 g/dL — ABNORMAL LOW (ref 12.0–15.0)
MCH: 27.7 pg (ref 26.0–34.0)
MCHC: 33.9 g/dL (ref 30.0–36.0)
MCV: 81.6 fL (ref 78.0–100.0)
Platelets: 244 10*3/uL (ref 150–400)
RBC: 4.08 MIL/uL (ref 3.87–5.11)
RDW: 12.6 % (ref 11.5–15.5)
WBC: 6.1 10*3/uL (ref 4.0–10.5)

## 2013-06-12 MED ORDER — ETONOGESTREL-ETHINYL ESTRADIOL 0.12-0.015 MG/24HR VA RING: VAGINAL_RING | VAGINAL | Status: DC

## 2013-06-12 MED ORDER — ESTROGENS CONJUGATED 25 MG IJ SOLR
25.0000 mg | Freq: Once | INTRAMUSCULAR | Status: AC
  Administered 2013-06-12: 25 mg via INTRAVENOUS
  Filled 2013-06-12: qty 25

## 2013-06-12 MED ORDER — IBUPROFEN 600 MG PO TABS: 600.0000 mg | ORAL_TABLET | Freq: Four times a day (QID) | ORAL | Status: DC | PRN

## 2013-06-12 MED ORDER — LACTATED RINGERS IV BOLUS (SEPSIS)
1000.0000 mL | Freq: Once | INTRAVENOUS | Status: AC
  Administered 2013-06-12: 1000 mL via INTRAVENOUS

## 2013-06-12 NOTE — MAU Note (Addendum)
D&C and hysteroscopy on Tues. (for endometriosis).  Bleeding woke her up; soaked through 4 pads in an our. Pad she wore in was soaked- on for an hour.  Had very little bleeding since surgery, pinkish during the night

## 2013-06-12 NOTE — MAU Provider Note (Signed)
History     CSN: [id number]  Arrival date and time: 06/12/13 0815 Provider on Unit: 0730 Provider at bedside: [place]    Chief Complaint  Patient presents with  . Vaginal Bleeding   HPI [name] is a 32 y.o female who presents with heavy vaginal bleeding since 0500 and c/o weakness, dizziness.  She is s/p Laparoscopic endometriosis excision, Hysteroscopy with resection of fibroid and polyp on 06/09/13. She reports saturating 3 super plus sanitary pads in 1 hour, feeling light-headed and dizzy.  She denies SOB, N/V or LOC. C/o shoulder pain and abdominal/ incision pain.    Past Medical History  Diagnosis Date  . Crohn's disease   . PONV (postoperative nausea and vomiting)   . Pneumonia     history back in 2006  . Heart murmur     as child, never had any problems  . Anxiety   . Seasonal allergies   . Headache(784.0)     otc meds - last migraine 2009  . Low blood potassium     history  . Crohn's disease     history  . Anemia     Past Surgical History  Procedure Laterality Date  . Tonsillectomy    . Adenoidectomy    . Dilatation & currettage/hysteroscopy with resectocope N/A 06/09/2013    Procedure: DILATATION & CURETTAGE/HYSTEROSCOPY WITH HYSTEROSCOPIC RESECTION OF SUBMUCOSAL FIBROID AND ENDOMETRIAL POLYP;  Surgeon: Marvene Staff, MD;  Location: [facility];  Service: Gynecology;  Laterality: N/A;  . Laparoscopy N/A 06/09/2013    Procedure: LAPAROSCOPY DIAGNOSTIC;  Surgeon: Marvene Staff, MD;  Location: [facility];  Service: Gynecology;  Laterality: N/A;  . Robotic assisted laparoscopic lysis of adhesion N/A 06/09/2013    Procedure: ROBOTIC ASSISTED LAPAROSCOPIC LYSIS OF ADHESION; Resection of Endometriosis and excision of fibroid;  Surgeon: Marvene Staff, MD;  Location: [facility];  Service: Gynecology;  Laterality: N/A;    Family History  Problem Relation Age of Onset  . Heart disease Mother 75    CHF    History  Substance Use Topics  . Smoking  status: Never Smoker   . Smokeless tobacco: Never Used  . Alcohol Use: No    Allergies: No Known Allergies  Prescriptions prior to admission  Medication Sig Dispense Refill  . acetaminophen (TYLENOL) 500 MG tablet Take 500 mg by mouth every 6 (six) hours as needed for pain.       . cetirizine (ZYRTEC) 10 MG tablet Take 10 mg by mouth as needed for allergies.      Marland Kitchen doxycycline (VIBRAMYCIN) 50 MG capsule Take 2 capsules (100 mg total) by mouth 2 (two) times daily.  28 capsule  0  . FLUoxetine (PROZAC) 20 MG capsule Take 20 mg by mouth daily.      Marland Kitchen HYDROmorphone (DILAUDID) 4 MG tablet Take 1 tablet (4 mg total) by mouth every 6 (six) hours as needed for pain.  30 tablet  0  . [DISCONTINUED] cefTRIAXone (ROCEPHIN) 1 G injection Inject 1 g into the muscle once.  1 each  0    Review of Systems  Constitutional: Positive for malaise/fatigue.  HENT: Negative.   Eyes: Negative.   Respiratory: Negative.  Negative for shortness of breath.   Cardiovascular: Negative.   Gastrointestinal: Negative.   Genitourinary: Negative.   Musculoskeletal: Negative.   Skin: Negative.   Neurological: Positive for dizziness and weakness. Negative for loss of consciousness.  Endo/Heme/Allergies: Negative.        Fam h/o "heavy bleeding  in females" per pt's mother      Physical Exam   Blood pressure 143/92, pulse 92, temperature 98.6 F (37 C), temperature source Oral, resp. rate 20, last menstrual period 05/24/2013.   Physical Exam  Constitutional: She is oriented to person, place, and time. She appears well-developed and well-nourished.  HENT:  Head: Normocephalic and atraumatic.  Eyes: EOM are normal. Pupils are equal, round, and reactive to light.  Neck: Normal range of motion. Neck supple.  Cardiovascular: Normal rate, regular rhythm and normal heart sounds.   Respiratory: Effort normal and breath sounds normal.  GI: Soft. Bowel sounds are normal. There is tenderness.  Normal post surgical  tenderness  Genitourinary: Vagina normal.  Moderate bright red bleeding  Musculoskeletal: Normal range of motion.  Neurological: She is alert and oriented to person, place, and time.  Skin: Skin is warm and dry.  Psychiatric: Her behavior is normal. Judgment and thought content normal.  Flat affect    MAU Course  Procedures Orthostatic BP CBC  Assessment and Plan  Post-op day #3, s/p Operative L/scopy, Hysteroscopy, Resection of Fibroid, Postoperative bleeding, Menorrhagia, acute bleeding with symptoms of dizziness but negative orthostatics and stable H/H.  Patient advised to resume Nuvaring - samples at office for her as originally planned by her MD, Dr. Garwin Brothers and see her in office in 2 wks as scheduled  Pt's mother is concerned and does not want her to leave MAU for outpatient bleeding management with Nuvaring (which she was asked to place back after surgery but patient cannot recall such instruction). Patient's mother is concerned if she is having "hemorrhagia" or what if she gets it after leaving here. Made several attempts to explain post-op bleeding and possible withdrawal bleeding since not using Nuvaring since 3 days and should resolve with restarting it and that her H/H and orthostatics are stable. Will proceed with IV LR 1 lit, IV Premarin 25 mg x once and general diet and D/c home once bleeding reduced. Patient to stop by at office to pick up [place] sample and start using it today. Reviewed warning s/s of HTN incl HA/CP/SOB and VTE precautions and warning s/s. Per pt's report her BP is usually not this elevated.  PO Gasex or Mylecon for post L/scopy shoulder pain. Resume NSAIDs - Ibuprofen that will also help menorrhagia.  30 min face to face for counseling and overall 1 hr in managing care.

## 2013-06-13 ENCOUNTER — Observation Stay (HOSPITAL_COMMUNITY): Payer: 59

## 2013-06-13 ENCOUNTER — Encounter (HOSPITAL_COMMUNITY): Payer: Self-pay | Admitting: *Deleted

## 2013-06-13 ENCOUNTER — Emergency Department (HOSPITAL_COMMUNITY): Payer: 59

## 2013-06-13 ENCOUNTER — Observation Stay (HOSPITAL_COMMUNITY)
Admission: EM | Admit: 2013-06-13 | Discharge: 2013-06-14 | Disposition: A | Discharge status: Home / Self Care | Payer: 59 | Attending: Internal Medicine | Admitting: Internal Medicine

## 2013-06-13 DIAGNOSIS — R42 Dizziness and giddiness: Secondary | ICD-10-CM

## 2013-06-13 DIAGNOSIS — R109 Unspecified abdominal pain: Secondary | ICD-10-CM | POA: Insufficient documentation

## 2013-06-13 DIAGNOSIS — M6281 Muscle weakness (generalized): Secondary | ICD-10-CM | POA: Insufficient documentation

## 2013-06-13 DIAGNOSIS — R259 Unspecified abnormal involuntary movements: Secondary | ICD-10-CM | POA: Insufficient documentation

## 2013-06-13 DIAGNOSIS — R262 Difficulty in walking, not elsewhere classified: Secondary | ICD-10-CM | POA: Insufficient documentation

## 2013-06-13 DIAGNOSIS — R1031 Right lower quadrant pain: Secondary | ICD-10-CM

## 2013-06-13 DIAGNOSIS — R269 Unspecified abnormalities of gait and mobility: Secondary | ICD-10-CM | POA: Insufficient documentation

## 2013-06-13 DIAGNOSIS — N898 Other specified noninflammatory disorders of vagina: Secondary | ICD-10-CM | POA: Insufficient documentation

## 2013-06-13 DIAGNOSIS — R52 Pain, unspecified: Secondary | ICD-10-CM | POA: Insufficient documentation

## 2013-06-13 DIAGNOSIS — R519 Headache, unspecified: Secondary | ICD-10-CM

## 2013-06-13 DIAGNOSIS — N939 Abnormal uterine and vaginal bleeding, unspecified: Secondary | ICD-10-CM

## 2013-06-13 DIAGNOSIS — R102 Pelvic and perineal pain unspecified side: Secondary | ICD-10-CM

## 2013-06-13 DIAGNOSIS — K5900 Constipation, unspecified: Secondary | ICD-10-CM | POA: Diagnosis present

## 2013-06-13 DIAGNOSIS — K5909 Other constipation: Secondary | ICD-10-CM

## 2013-06-13 DIAGNOSIS — R55 Syncope and collapse: Principal | ICD-10-CM

## 2013-06-13 DIAGNOSIS — Z8742 Personal history of other diseases of the female genital tract: Secondary | ICD-10-CM

## 2013-06-13 HISTORY — DX: Personal history of other diseases of the female genital tract: Z87.42

## 2013-06-13 LAB — CBC
HCT: 34.9 % — ABNORMAL LOW (ref 36.0–46.0)
HCT: 32.6 % — ABNORMAL LOW (ref 36.0–46.0)
Hemoglobin: 12 g/dL (ref 12.0–15.0)
Hemoglobin: 11.3 g/dL — ABNORMAL LOW (ref 12.0–15.0)
MCH: 28.1 pg (ref 26.0–34.0)
MCH: 28.3 pg (ref 26.0–34.0)
MCHC: 34.4 g/dL (ref 30.0–36.0)
MCHC: 34.7 g/dL (ref 30.0–36.0)
MCV: 81.7 fL (ref 78.0–100.0)
MCV: 81.5 fL (ref 78.0–100.0)
Platelets: 285 10*3/uL (ref 150–400)
Platelets: 257 K/uL (ref 150–400)
RBC: 4.27 MIL/uL (ref 3.87–5.11)
RBC: 4 MIL/uL (ref 3.87–5.11)
RDW: 12.8 % (ref 11.5–15.5)
RDW: 12.6 % (ref 11.5–15.5)
WBC: 6.7 10*3/uL (ref 4.0–10.5)
WBC: 5.7 K/uL (ref 4.0–10.5)

## 2013-06-13 LAB — CREATININE, SERUM
Creatinine, Ser: 0.6 mg/dL (ref 0.50–1.10)
GFR calc Af Amer: >90 mL/min
GFR calc non Af Amer: >90 mL/min

## 2013-06-13 LAB — POCT I-STAT, CHEM 8
BUN: 10 mg/dL (ref 6–23)
Calcium, Ion: 1.08 mmol/L — ABNORMAL LOW (ref 1.12–1.23)
Chloride: 101 mEq/L (ref 96–112)
Creatinine, Ser: 0.7 mg/dL (ref 0.50–1.10)
Glucose, Bld: 88 mg/dL (ref 70–99)
HCT: 35 % — ABNORMAL LOW (ref 36.0–46.0)
Hemoglobin: 11.9 g/dL — ABNORMAL LOW (ref 12.0–15.0)
Potassium: 3 mEq/L — ABNORMAL LOW (ref 3.5–5.1)
Sodium: 139 mEq/L (ref 135–145)
TCO2: 27 mmol/L (ref 0–100)

## 2013-06-13 LAB — TROPONIN I
Troponin I: <0.3 ng/mL (ref <0.30)
Troponin I: <0.3 ng/mL (ref <0.30)

## 2013-06-13 MED ORDER — BISACODYL 5 MG PO TBEC
5.0000 mg | DELAYED_RELEASE_TABLET | Freq: Every day | ORAL | Status: DC | PRN
  Filled 2013-06-13: qty 1

## 2013-06-13 MED ORDER — ONDANSETRON HCL 4 MG/2ML IJ SOLN: 4.0000 mg | Freq: Three times a day (TID) | INTRAMUSCULAR | Status: DC | PRN

## 2013-06-13 MED ORDER — HYDROCODONE-ACETAMINOPHEN 5-325 MG PO TABS
1.0000 | ORAL_TABLET | ORAL | Status: DC | PRN
  Administered 2013-06-14 (×2): 1 via ORAL
  Filled 2013-06-13 (×3): qty 1

## 2013-06-13 MED ORDER — MECLIZINE HCL 25 MG PO TABS: 25.0000 mg | ORAL_TABLET | Freq: Once | ORAL | Status: DC

## 2013-06-13 MED ORDER — DIAZEPAM 5 MG/ML IJ SOLN: 1.0000 mg | Freq: Four times a day (QID) | INTRAMUSCULAR | Status: DC | PRN

## 2013-06-13 MED ORDER — POTASSIUM CHLORIDE 10 MEQ/100ML IV SOLN
10.0000 meq | Freq: Once | INTRAVENOUS | Status: AC
  Administered 2013-06-13: 10 meq via INTRAVENOUS
  Filled 2013-06-13: qty 100

## 2013-06-13 MED ORDER — IBUPROFEN 800 MG PO TABS
800.0000 mg | ORAL_TABLET | Freq: Three times a day (TID) | ORAL | Status: DC
  Filled 2013-06-13 (×3): qty 1

## 2013-06-13 MED ORDER — SODIUM CHLORIDE 0.9 % IV SOLN
INTRAVENOUS | Status: DC
  Administered 2013-06-13 (×3): via INTRAVENOUS
  Administered 2013-06-14: 1000 mL via INTRAVENOUS

## 2013-06-13 MED ORDER — MAGNESIUM HYDROXIDE 400 MG/5ML PO SUSP
30.0000 mL | Freq: Every day | ORAL | Status: DC | PRN
  Administered 2013-06-13: 30 mL via ORAL
  Filled 2013-06-13: qty 30

## 2013-06-13 MED ORDER — HEPARIN SODIUM (PORCINE) 5000 UNIT/ML IJ SOLN
5000.0000 [IU] | Freq: Three times a day (TID) | INTRAMUSCULAR | Status: DC
  Administered 2013-06-13 – 2013-06-14 (×3): 5000 [IU] via SUBCUTANEOUS
  Filled 2013-06-13 (×6): qty 1

## 2013-06-13 MED ORDER — POTASSIUM CHLORIDE CRYS ER 20 MEQ PO TBCR: 40.0000 meq | EXTENDED_RELEASE_TABLET | Freq: Once | ORAL | Status: DC

## 2013-06-13 MED ORDER — KETOROLAC TROMETHAMINE 30 MG/ML IJ SOLN: 30.0000 mg | Freq: Four times a day (QID) | INTRAMUSCULAR | Status: DC | PRN

## 2013-06-13 MED ORDER — DOCUSATE SODIUM 100 MG PO CAPS
100.0000 mg | ORAL_CAPSULE | Freq: Two times a day (BID) | ORAL | Status: DC
  Administered 2013-06-13: 100 mg via ORAL
  Filled 2013-06-13 (×4): qty 1

## 2013-06-13 MED ORDER — KETOROLAC TROMETHAMINE 30 MG/ML IJ SOLN
30.0000 mg | Freq: Once | INTRAMUSCULAR | Status: AC
  Administered 2013-06-13: 30 mg via INTRAVENOUS
  Filled 2013-06-13: qty 1

## 2013-06-13 MED ORDER — LORAZEPAM 2 MG/ML IJ SOLN: 2.0000 mg | Freq: Once | INTRAMUSCULAR | Status: DC

## 2013-06-13 MED ORDER — ONDANSETRON HCL 4 MG/2ML IJ SOLN
4.0000 mg | Freq: Once | INTRAMUSCULAR | Status: AC
Start: 2013-06-13 — End: 2013-06-13
  Administered 2013-06-13: 4 mg via INTRAVENOUS
  Filled 2013-06-13: qty 2

## 2013-06-13 MED ORDER — MORPHINE SULFATE 2 MG/ML IJ SOLN: 2.0000 mg | INTRAMUSCULAR | Status: DC | PRN

## 2013-06-13 MED ORDER — SODIUM CHLORIDE 0.9 % IJ SOLN
3.0000 mL | Freq: Two times a day (BID) | INTRAMUSCULAR | Status: DC
  Administered 2013-06-13: 3 mL via INTRAVENOUS

## 2013-06-13 MED ORDER — ACETAMINOPHEN 325 MG PO TABS: 650.0000 mg | ORAL_TABLET | Freq: Four times a day (QID) | ORAL | Status: DC | PRN

## 2013-06-13 MED ORDER — ONDANSETRON HCL 4 MG/2ML IJ SOLN: 4.0000 mg | Freq: Four times a day (QID) | INTRAMUSCULAR | Status: DC | PRN

## 2013-06-13 MED ORDER — FENTANYL CITRATE 0.05 MG/ML IJ SOLN
50.0000 ug | INTRAMUSCULAR | Status: DC | PRN
  Administered 2013-06-13: 02:00:00 via INTRAVENOUS
  Administered 2013-06-13: 50 ug via INTRAVENOUS
  Filled 2013-06-13 (×2): qty 2

## 2013-06-13 MED ORDER — POTASSIUM CHLORIDE CRYS ER 20 MEQ PO TBCR
40.0000 meq | EXTENDED_RELEASE_TABLET | Freq: Once | ORAL | Status: AC
  Administered 2013-06-13: 40 meq via ORAL
  Filled 2013-06-13: qty 2

## 2013-06-13 MED ORDER — INFLUENZA VAC SPLIT QUAD 0.5 ML IM SUSP
0.5000 mL | INTRAMUSCULAR | Status: AC
  Administered 2013-06-14: 0.5 mL via INTRAMUSCULAR
  Filled 2013-06-13: qty 0.5

## 2013-06-13 MED ORDER — METOCLOPRAMIDE HCL 5 MG/ML IJ SOLN
10.0000 mg | Freq: Once | INTRAMUSCULAR | Status: AC
  Administered 2013-06-13: 10 mg via INTRAVENOUS
  Filled 2013-06-13: qty 2

## 2013-06-13 MED ORDER — HYDROCODONE-ACETAMINOPHEN 5-325 MG PO TABS: 1.0000 | ORAL_TABLET | Freq: Once | ORAL | Status: DC

## 2013-06-13 MED ORDER — FLUOXETINE HCL 20 MG PO CAPS
20.0000 mg | ORAL_CAPSULE | Freq: Every day | ORAL | Status: DC
Start: 2013-06-13 — End: 2013-06-14
  Administered 2013-06-13 – 2013-06-14 (×2): 20 mg via ORAL
  Filled 2013-06-13 (×2): qty 1

## 2013-06-13 MED ORDER — ACETAMINOPHEN 650 MG RE SUPP: 650.0000 mg | Freq: Four times a day (QID) | RECTAL | Status: DC | PRN

## 2013-06-13 MED ORDER — ONDANSETRON HCL 4 MG PO TABS
4.0000 mg | ORAL_TABLET | Freq: Four times a day (QID) | ORAL | Status: DC | PRN
  Administered 2013-06-14: 4 mg via ORAL
  Filled 2013-06-13: qty 1

## 2013-06-13 MED ORDER — SODIUM CHLORIDE 0.9 % IV BOLUS (SEPSIS): 1000.0000 mL | Freq: Once | INTRAVENOUS | Status: DC

## 2013-06-13 NOTE — ED Notes (Signed)
Recent laparoscopic surgery this past Tuesday or exploration of tumors/cysts.  Had a near syncope episode tonite while getting out of bed. Took dilaudid prior to sleeping. [facility]bg [facility]. 164/107, 70, 99 [id number].

## 2013-06-13 NOTE — Procedures (Signed)
ELECTROENCEPHALOGRAM REPORT   Patient: Diane [name]       Room #: 8N18 EEG No. ID: [id number] Age: 32 y.o.        Sex: female Referring Physician: Murvin Natal Report Date:  06/13/2013        Interpreting Physician: Anthony Sar  History: Diane [name] is an 32 y.o. female admitted with headache vertigo and history of an episode of loss of consciousness on 06/12/2013 with associated shaking. Patient reportedly was confused on waking up.  Indications for study: Rule out seizure disorder.   Technique: This is an 18 channel routine scalp EEG performed at the bedside with bipolar and monopolar montages arranged in accordance to the international 10/20 system of electrode placement.   Description: This EEG was recorded during wakefulness and during sleep.  Activity during wakefulness consists of 9 Hz symmetric alpha rhythm which cannulated well with eye opening. Photic stimulation was not performed. Hyperventilation was not performed. During sleep there was slowing of background activity diffusely and symmetrically with mixed irregular delta and theta activity. Symmetrical vertex waves, sleep spindles and arousal responses were recorded during stage II of sleep. No epileptiform discharges were recorded. There was no abnormal slowing.  Interpretation: This a normal EEG recording during wakefulness and during sleep. No evidence of an epileptic disorder is demonstrated.   Rush Farmer M.D. [facility] [phone]

## 2013-06-13 NOTE — Progress Notes (Signed)
EEG completed; results pending.    

## 2013-06-13 NOTE — Evaluation (Signed)
Physical Therapy Evaluation Patient Details Name: Diane [name] MRN: [id number] DOB: 06-06-1981 Today's Date: 06/13/2013 Time: 7829-5621 PT Time Calculation (min): 42 min  PT Assessment / Plan / Recommendation History of Present Illness  Diane [name] is an 32 y.o. female with a history of Crohn's disease, anxiety, anemia and status post gynecologic procedures on 06/09/2013 4 dysmenorrhea and dysfunctional uterine bleeding, presented to the emergency room with complaint of dizziness with subsequent loss of consciousness as well as headache. Patient's mother described an episode of loss of consciousness with shaking of extremities followed by confusion. There is no history of seizure disorder. Patient was seen at [facility] for vaginal bleeding which was brought under control. She subsequently came to the emergency room here for continued lightheadedness and headache.  Clinical Impression  Patient presents with problems listed below.  Also initiated vestibular evaluation.  Patient with difficulty remaining on target with smooth pursuits.  Slight overshooting with saccades.  Normal VOR test.  Performed Roll test with initiation of headache and mild dizziness to left.  Patient unable to tolerate further testing today.  Will complete vestibular testing in am.  Will benefit from acute PT to maximize independence prior to discharge home.    PT Assessment  Patient needs continued PT services    Follow Up Recommendations  Home health PT;Supervision/Assistance - 24 hour    Does the patient have the potential to tolerate intense rehabilitation      Barriers to Discharge Decreased caregiver support Reports mother will be helping out    Equipment Recommendations  Other (comment) (TBD)    Recommendations for Other Services     Frequency Min 5X/week    Precautions / Restrictions Precautions Precautions: Fall Restrictions Weight Bearing Restrictions: No   Pertinent Vitals/Pain        Mobility  Bed Mobility Bed Mobility: Supine to Sit;Sitting - Scoot to Edge of Bed;Sit to Supine Supine to Sit: 4: Min guard;HOB elevated Sitting - Scoot to Edge of Bed: 4: Min guard Sit to Supine: 4: Min guard;HOB elevated Details for Bed Mobility Assistance: Verbal cues for technique.  No dizziness noted with position changes. Transfers Transfers: Sit to Stand;Stand to Sit Sit to Stand: 4: Min assist;With upper extremity assist;From bed;From toilet Stand to Sit: 4: Min assist;With upper extremity assist;To toilet;To bed Details for Transfer Assistance: Verbal cues for hand placement.  Assist for balance/safety. Ambulation/Gait Ambulation/Gait Assistance: 4: Min assist Ambulation Distance (Feet): 24 Feet Assistive device: 1 person hand held assist Ambulation/Gait Assistance Details: Patient with slow guarded gait.  Unsteady.   Gait Pattern: Step-through pattern;Decreased step length - right;Decreased step length - left;Decreased stride length;Shuffle;Trunk flexed Gait velocity: Slow gait speed    Exercises     PT Diagnosis: Difficulty walking;Abnormality of gait;Generalized weakness;Acute pain  PT Problem List: Decreased strength;Decreased activity tolerance;Decreased balance;Decreased mobility;Decreased knowledge of use of DME;Pain (Dizziness) PT Treatment Interventions: DME instruction;Gait training;Stair training;Functional mobility training;Balance training;Patient/family education (Vestibular rehab)     PT Goals(Current goals can be found in the care plan section) Acute Rehab PT Goals Patient Stated Goal: To feel better PT Goal Formulation: With patient Time For Goal Achievement: 06/20/13 Potential to Achieve Goals: Good  Visit Information  Last PT Received On: 06/13/13 Assistance Needed: +1 History of Present Illness: Diane [name] is an 32 y.o. female with a history of Crohn's disease, anxiety, anemia and status post gynecologic procedures on 06/09/2013 4  dysmenorrhea and dysfunctional uterine bleeding, presented to the emergency room with complaint of dizziness with  subsequent loss of consciousness as well as headache. Patient's mother described an episode of loss of consciousness with shaking of extremities followed by confusion. There is no history of seizure disorder. Patient was seen at [facility] for vaginal bleeding which was brought under control. She subsequently came to the emergency room here for continued lightheadedness and headache.       Prior Functioning  Home Living Family/patient expects to be discharged to:: Private residence Living Arrangements: Children (7 yo and 23 yo children) Available Help at Discharge: Family;Available 24 hours/day Type of Home: Apartment Home Access: Stairs to enter [employer] of Steps: flight Entrance Stairs-Rails: Right;Left Home Layout: One level Home Equipment: Crutches Prior Function Level of Independence: Independent Communication Communication: No difficulties    Cognition  Cognition Arousal/Alertness: Awake/alert Behavior During Therapy: WFL for tasks assessed/performed Overall Cognitive Status: Within Functional Limits for tasks assessed    Extremity/Trunk Assessment Upper Extremity Assessment Upper Extremity Assessment: Overall WFL for tasks assessed Lower Extremity Assessment Lower Extremity Assessment: Generalized weakness   Balance Balance Balance Assessed: Yes Static Sitting Balance Static Sitting - Balance Support: No upper extremity supported;Feet supported Static Sitting - Level of Assistance: 5: Stand by assistance Static Sitting - Comment/# of Minutes: 4  End of Session PT - End of Session Equipment Utilized During Treatment: Gait belt Activity Tolerance: Patient limited by fatigue;Patient limited by pain Patient left: in bed;with call Pro[employer]/phone within reach;with family/visitor present Nurse Communication: Mobility status  GP Functional  Assessment Tool Used: Clinical judgement Functional Limitation: Mobility: Walking and moving around Mobility: Walking and Moving Around Current Status [phone]): At least 1 percent but less than 20 percent impaired, limited or restricted Mobility: Walking and Moving Around Goal Status [phone]): 0 percent impaired, limited or restricted   Vena Austria 06/13/2013, 5:54 PM Durenda Hurt. Renaldo Fiddler, [facility] Acute Rehab Services Pager [phone]

## 2013-06-13 NOTE — ED Notes (Signed)
Patient states pain is the same no relief . Admitting Md aware

## 2013-06-13 NOTE — H&P (Signed)
History and Physical Examination  Diane [name]:[id number] DOB: Apr 15, 1981 DOA: 06/13/2013  Referring physician: Kathrynn Humble PCP: Eulas Post, MD  Specialists: Lesle Chris, MD gynecology  Chief Complaint: vertigo  HPI: Diane [name] is a 32 y.o. female with a history of Crohn's disease, anxiety, anemia and status post gynecologic procedures on 06/09/2013 for endometriosis, dysmenorrhea and dysfunctional uterine bleeding.  She presented to the emergency room with complaint of dizziness with subsequent loss of consciousness as well as headache. Patient's mother described an episode of loss of consciousness with shaking of extremities followed by confusion. There is no history of seizure disorder. Patient was seen at [facility] for vaginal bleeding which was brought under control. She subsequently came to the emergency room here for continued lightheadedness and headache. CT scan of her head showed no acute intracranial abnormality. She was given IV fluids as well as Ativan and meclizine. There is no improvement in lightheadedness.  She continued to have headache and dizziness/vertigo.  She was seen by neurologist and a hospitalization was requested for observation, telemetry, EEG and MRI.    Past Medical History Past Medical History  Diagnosis Date  . Crohn's disease   . PONV (postoperative nausea and vomiting)   . Pneumonia     history back in 2006  . Heart murmur     as child, never had any problems  . Anxiety   . Seasonal allergies   . Headache(784.0)     otc meds - last migraine 2009  . Low blood potassium     history  . Crohn's disease     history  . Anemia   . History of endometriosis     Past Surgical History Past Surgical History  Procedure Laterality Date  . Tonsillectomy    . Adenoidectomy    . Dilatation & currettage/hysteroscopy with resectocope N/A 06/09/2013    Procedure: DILATATION & CURETTAGE/HYSTEROSCOPY WITH HYSTEROSCOPIC RESECTION OF SUBMUCOSAL  FIBROID AND ENDOMETRIAL POLYP;  Surgeon: Marvene Staff, MD;  Location: [facility];  Service: Gynecology;  Laterality: N/A;  . Laparoscopy N/A 06/09/2013    Procedure: LAPAROSCOPY DIAGNOSTIC;  Surgeon: Marvene Staff, MD;  Location: [facility];  Service: Gynecology;  Laterality: N/A;  . Robotic assisted laparoscopic lysis of adhesion N/A 06/09/2013    Procedure: ROBOTIC ASSISTED LAPAROSCOPIC LYSIS OF ADHESION; Resection of Endometriosis and excision of fibroid;  Surgeon: Marvene Staff, MD;  Location: [facility];  Service: Gynecology;  Laterality: N/A;    Home Meds: Prior to Admission medications   Medication Sig Start Date End Date Taking? Authorizing Provider  acetaminophen (TYLENOL) 500 MG tablet Take 500 mg by mouth every 6 (six) hours as needed for pain.   Yes Historical Provider, MD  cetirizine (ZYRTEC) 10 MG tablet Take 10 mg by mouth daily.   Yes Historical Provider, MD  doxycycline (VIBRAMYCIN) 50 MG capsule Take 2 capsules (100 mg total) by mouth 2 (two) times daily. 06/09/13  Yes Sheronette Clint Bolder, MD  etonogestrel-ethinyl estradiol (NUVARING) 0.12-0.015 MG/24HR vaginal ring Insert vaginally and leave in place for 3 consecutive weeks, then remove for 1 week. 06/12/13  Yes Elveria Royals, MD  FLUoxetine (PROZAC) 20 MG capsule Take 20 mg by mouth daily.   Yes Historical Provider, MD  HYDROmorphone (DILAUDID) 4 MG tablet Take 1 tablet (4 mg total) by mouth every 6 (six) hours as needed for pain. 06/09/13  Yes Sheronette A Cousins, MD  ibuprofen (ADVIL,MOTRIN) 600 MG tablet Take 1 tablet (600 mg total) by mouth  every 6 (six) hours as needed for pain. 06/12/13  Yes Elveria Royals, MD  nystatin cream (MYCOSTATIN) Apply topically 2 (two) times daily.   Yes Historical Provider, MD  traMADol (ULTRAM) 50 MG tablet Take 50-100 mg by mouth every 6 (six) hours as needed for pain.   Yes Historical Provider, MD   Allergies: Review of patient's allergies indicates no known allergies.  Social  History:  History   Social History  . Marital Status: Legally Separated    Spouse Name: N/A    Number of Children: N/A  . Years of Education: N/A   Occupational History  . Not on file.   Social History Main Topics  . Smoking status: Never Smoker   . Smokeless tobacco: Never Used  . Alcohol Use: No  . Drug Use: No  . Sexual Activity: Yes    Birth Control/ Protection: Inserts     Comment: nuvaring   Other Topics Concern  . Not on file   Social History Narrative  . No narrative on file   Family History:  Family History  Problem Relation Age of Onset  . Heart disease Mother 3    CHF    Review of Systems: The patient denies anorexia, fever, weight loss,, vision loss, decreased hearing, hoarseness, chest pain, syncope, dyspnea on exertion, peripheral edema, hemoptysis, abdominal pain, melena, hematochezia, severe indigestion/heartburn, hematuria, incontinence, genital sores, muscle weakness, suspicious skin lesions, transient blindness, difficulty walking, depression, unusual weight change, enlarged lymph nodes, angioedema, and breast masses.  Pleae see HPI.  All other systems reviewed and reported as negative.   Physical Exam: Blood pressure 153/89, pulse 69, temperature 98.9 F (37.2 C), temperature source Oral, resp. rate 18, height 5' 7.75" (1.721 m), weight 71.079 kg (156 lb 11.2 oz), last menstrual period 05/24/2013, SpO2 100.00%. General appearance: alert, cooperative, appears stated age and no distress Head: Normocephalic, without obvious abnormality, atraumatic Eyes: conjunctivae/corneas clear. PERRL, EOM's intact. Fundi benign. Nose: Nares normal. Septum midline. Mucosa normal. No drainage or sinus tenderness., no discharge Throat: lips, mucosa, and tongue normal; teeth and gums normal Neck: no adenopathy, no carotid bruit, no JVD, supple, symmetrical, trachea midline and thyroid not enlarged, symmetric, no tenderness/mass/nodules Lungs: clear to auscultation  bilaterally and normal percussion bilaterally Heart: regular rate and rhythm, S1, S2 normal, no murmur, click, rub or gallop Abdomen: wounds c/d/i. soft abd, TTP, BS present Extremities: extremities normal, atraumatic, no cyanosis or edema Pulses: 2+ and symmetric Skin: Skin color, texture, turgor normal. No rashes or lesions Neurologic: Grossly normal  Lab  And Imaging results:  Results for orders placed during the hospital encounter of 06/13/13 (from the past 24 hour(s))  POCT I-STAT, CHEM 8     Status: Abnormal   Collection Time    06/13/13  1:27 AM      Result Value Range   Sodium 139  135 - 145 mEq/L   Potassium 3.0 (*) 3.5 - 5.1 mEq/L   Chloride 101  96 - 112 mEq/L   BUN 10  6 - 23 mg/dL   Creatinine, Ser 0.70  0.50 - 1.10 mg/dL   Glucose, Bld 88  70 - 99 mg/dL   Calcium, Ion 1.08 (*) 1.12 - 1.23 mmol/L   TCO2 27  0 - 100 mmol/L   Hemoglobin 11.9 (*) 12.0 - 15.0 g/dL   HCT 35.0 (*) 36.0 - 46.0 %  CBC     Status: Abnormal   Collection Time    06/13/13  1:35 AM  Result Value Range   WBC 6.7  4.0 - 10.5 K/uL   RBC 4.27  3.87 - 5.11 MIL/uL   Hemoglobin 12.0  12.0 - 15.0 g/dL   HCT 34.9 (*) 36.0 - 46.0 %   MCV 81.7  78.0 - 100.0 fL   MCH 28.1  26.0 - 34.0 pg   MCHC 34.4  30.0 - 36.0 g/dL   RDW 12.8  11.5 - 15.5 %   Platelets 285  150 - 400 K/uL   Impression / Plan  Vertigo Appreciate neuro consult and following Pt being admitted for observation, telemetry MRI pending EEG pending Valium every 6 hours prn  Headache Pain meds will be ordered as needed  Syncope Work up in progress   Other constipation Laxatives will be ordered  History of endometriosis Pt is post op s/p D&C and laparotomy  [facility] [place] MD [place], [place] 06/13/2013, 12:55 PM

## 2013-06-13 NOTE — ED Notes (Signed)
Patient transported to MRI 

## 2013-06-13 NOTE — Consult Note (Signed)
Reason for Consult: Vertigo and loss of consciousness.  HPI:                                                                                                                                          Diane [name] is an 32 y.o. female with a history of Crohn's disease, anxiety, anemia and status post gynecologic procedures on 06/09/2013 4 dysmenorrhea and dysfunctional uterine bleeding, presented to the emergency room with complaint of dizziness with subsequent loss of consciousness as well as headache. Patient's mother described an episode of loss of consciousness with shaking of extremities followed by confusion. There is no history of seizure disorder. Patient was seen at [facility] for vaginal bleeding which was brought under control. She subsequently came to the emergency room here for continued lightheadedness and headache. CT scan of her head showed no acute intracranial abnormality. She was given IV fluids as well as Ativan and meclizine. There is no improvement in lightheadedness.  Past Medical History  Diagnosis Date  . Crohn's disease   . PONV (postoperative nausea and vomiting)   . Pneumonia     history back in 2006  . Heart murmur     as child, never had any problems  . Anxiety   . Seasonal allergies   . Headache(784.0)     otc meds - last migraine 2009  . Low blood potassium     history  . Crohn's disease     history  . Anemia     Past Surgical History  Procedure Laterality Date  . Tonsillectomy    . Adenoidectomy    . Dilatation & currettage/hysteroscopy with resectocope N/A 06/09/2013    Procedure: DILATATION & CURETTAGE/HYSTEROSCOPY WITH HYSTEROSCOPIC RESECTION OF SUBMUCOSAL FIBROID AND ENDOMETRIAL POLYP;  Surgeon: Marvene Staff, MD;  Location: [facility];  Service: Gynecology;  Laterality: N/A;  . Laparoscopy N/A 06/09/2013    Procedure: LAPAROSCOPY DIAGNOSTIC;  Surgeon: Marvene Staff, MD;  Location: [facility];  Service: Gynecology;  Laterality: N/A;  .  Robotic assisted laparoscopic lysis of adhesion N/A 06/09/2013    Procedure: ROBOTIC ASSISTED LAPAROSCOPIC LYSIS OF ADHESION; Resection of Endometriosis and excision of fibroid;  Surgeon: Marvene Staff, MD;  Location: [facility];  Service: Gynecology;  Laterality: N/A;    Family History  Problem Relation Age of Onset  . Heart disease Mother 3    CHF    Social History:  reports that she has never smoked. She has never used smokeless tobacco. She reports that she does not drink alcohol or use illicit drugs.  No Known Allergies  MEDICATIONS:  I have reviewed the patient's current medications.   ROS:                                                                                                                                       History obtained from mother and the patient  General ROS: negative for - chills, fatigue, fever, night sweats, weight gain or weight loss Psychological ROS: negative for - behavioral disorder, hallucinations, memory difficulties, mood swings or suicidal ideation Ophthalmic ROS: negative for - blurry vision, double vision, eye pain or loss of vision ENT ROS: negative for - epistaxis, nasal discharge, oral lesions, sore throat, tinnitus or vertigo Allergy and Immunology ROS: negative for - hives or itchy/watery eyes Hematological and Lymphatic ROS: negative for - bleeding problems, bruising or swollen lymph nodes Endocrine ROS: negative for - galactorrhea, hair pattern changes, polydipsia/polyuria or temperature intolerance Respiratory ROS: negative for - cough, hemoptysis, shortness of breath or wheezing Cardiovascular ROS: negative for - chest pain, dyspnea on exertion, edema or irregular heartbeat Gastrointestinal ROS: negative for - abdominal pain, diarrhea, hematemesis, nausea/vomiting or stool incontinence Genito-Urinary ROS: See history of  present illness Musculoskeletal ROS: negative for - joint swelling or muscular weakness Neurological ROS: as noted in HPI Dermatological ROS: negative for rash and skin lesion changes   Blood pressure 144/90, pulse 70, temperature 98.4 F (36.9 C), temperature source Oral, resp. rate 18, last menstrual period 05/24/2013, SpO2 99.00%.   Neurologic Examination:                                                                                                      Mental Status: Somewhat drowsy but easy to arouse; oriented x3.  Speech fluent without evidence of aphasia. Able to follow commands, but poorly cooperative. Cranial Nerves: II-Visual fields were normal. III/IV/VI-Pupils were equal and reacted. Extraocular movements were full and conjugate.    V/VII-no facial numbness and no facial weakness. VIII-normal. X-normal speech and symmetrical palatal movement. Motor: 5/5 bilaterally with normal tone and bulk Sensory: Normal throughout. Deep Tendon Reflexes: 2+ and symmetric. Plantars: Flexor bilaterally Cerebellar: Normal finger-to-nose testing.  No results found for this basename: cbc, bmp, coags, chol, tri, ldl, hga1c    Results for orders placed during the hospital encounter of 06/13/13 (from the past 48 hour(s))  POCT I-STAT, CHEM 8     Status: Abnormal   Collection Time    06/13/13  1:27 AM      Result Value Range   Sodium 139  135 - 145 mEq/L   Potassium 3.0 (*) 3.5 - 5.1 mEq/L   Chloride 101  96 - 112 mEq/L   BUN 10  6 - 23 mg/dL   Creatinine, Ser 0.70  0.50 - 1.10 mg/dL   Glucose, Bld 88  70 - 99 mg/dL   Calcium, Ion 1.08 (*) 1.12 - 1.23 mmol/L   TCO2 27  0 - 100 mmol/L   Hemoglobin 11.9 (*) 12.0 - 15.0 g/dL   HCT 35.0 (*) 36.0 - 46.0 %  CBC     Status: Abnormal   Collection Time    06/13/13  1:35 AM      Result Value Range   WBC 6.7  4.0 - 10.5 K/uL   RBC 4.27  3.87 - 5.11 MIL/uL   Hemoglobin 12.0  12.0 - 15.0 g/dL   HCT 34.9 (*) 36.0 - 46.0 %   MCV 81.7  78.0  - 100.0 fL   MCH 28.1  26.0 - 34.0 pg   MCHC 34.4  30.0 - 36.0 g/dL   RDW 12.8  11.5 - 15.5 %   Platelets 285  150 - 400 K/uL    Ct Head Wo Contrast  06/13/2013   CLINICAL DATA:  Near syncopal episode.  EXAM: CT HEAD WITHOUT CONTRAST  TECHNIQUE: Contiguous axial images were obtained from the base of the skull through the vertex without intravenous contrast.  COMPARISON:  Him all are 12/25/2011  FINDINGS: Layering debris in the sphenoid sinus. There is no evidence of acute intracranial hemorrhage, brain edema, mass lesion, acute infarction, mass effect, or midline shift. Acute infarct may be in apparent on noncontrast CT. No other intra-axial abnormalities are seen, and the ventricles and sulci are within normal limits in size and symmetry. No abnormal extra-axial fluid collections or masses are identified. No significant calvarial abnormality.  IMPRESSION: Negative for bleed or other acute intracranial process.   Electronically Signed   By: Arne Cleveland M.D.   On: 06/13/2013 06:40   Assessment/Plan: Persistent vertigo and headache as well as history of an episode of loss of consciousness of unclear etiology. Syncope is likely. However, given the confusion described by mother on regaining consciousness, generalized seizure cannot be ruled out.  Recommendations: 1. Admission for observation and cardiac telemetry. 2. MRI of the brain without and with contrast media. 3. EEG, routine adult. 4. Trial of diazepam for vertigo 1-2 mg every 6 hours when necessary. 5. Continue Toradol for headache as needed.  We will continue to follow this patient with you.  C.R. Nicole Kindred, MD [facility] [phone]  06/13/2013, 8:32 AM

## 2013-06-13 NOTE — ED Provider Notes (Addendum)
  Physical Exam  BP 155/95  Pulse 70  Temp(Src) 98.4 F (36.9 C) (Oral)  Resp 16  SpO2 99%  LMP 05/24/2013  Physical Exam  ED Course  Procedures  MDM Assuming care of patient from Dr. Marnette Burgess Patient in the ED for vertigo, syncope, generalized weakness, vaginal bleeding.                 Workup thus far is negative, Hb is stable. BP is WNL, mildly low potassium. Patient was discharged -  RN states that patient is unable to walk. I evaluated the patient again, and patient had no dysmetria, but her heel to shin test revealed generalized weakness, strength of 3/5 bilaterally, LLE weaker than right. Pt reports that upon discharge yday, she was doing well, and that her dizziness and syncope occurred prior to ED arrival. Mother reports seeing some shaking and a blank stare.  I don't have concerns for stroke. Vertigo is positional. I have ordered some fluids, ativan, meclizine. Toradol, reglan for the headaches. Pt will get Neuro consult for the weakness, and based on neuro evaluation, she will be discharged, or get further workup.  DDX: Peripheral Vertigo Syncope Seizure Complex headaches Generalized weakness Somatization    Varney Biles, MD 06/13/13 4068   Date: 06/13/2013  Rate: 76  Rhythm: normal sinus rhythm  QRS Axis: normal  Intervals: normal  ST/T Wave abnormalities: nonspecific ST/T changes  Conduction Disutrbances:none  Narrative Interpretation:   Old EKG Reviewed: none available T wave depressions v1-v3    Varney Biles, MD 06/13/13 [phone]

## 2013-06-13 NOTE — ED Notes (Signed)
Patient transported to CT 

## 2013-06-13 NOTE — ED Notes (Signed)
Patient resting on stretcher , mother at bedside. Patient c/o headache , states she is lightheaded. States she isn't going home like this. MD at bedside. Speaking with mother and patient.

## 2013-06-13 NOTE — ED Provider Notes (Signed)
CSN: [id number]     Arrival date & time 06/13/13  0053 History   First MD Initiated Contact with Patient 06/13/13 0106     Chief Complaint  Patient presents with  . Near Syncope   (Consider location/radiation/quality/duration/timing/severity/associated sxs/prior Treatment) HPI History provided by patient. [facility] and curettage/hysteroscopy with resection of fibroid. She states she was very reluctant to be discharged due to ongoing bleeding. She was prescribed Dilaudid and tonight after going home, developed near syncope with persistent vaginal bleeding. She continues to have pelvic pain sharp in quality unrelieved by medications. Patient appears very anxious and her mother bedside very much feels that she needs to be readmitted to the hospital. No fevers. No syncope. Patient's mother expresses elevated concern about her syncopal event. No shortness of breath. No chest pain. No Fall or head/neck injury. Past Medical History  Diagnosis Date  . Crohn's disease   . PONV (postoperative nausea and vomiting)   . Pneumonia     history back in 2006  . Heart murmur     as child, never had any problems  . Anxiety   . Seasonal allergies   . Headache(784.0)     otc meds - last migraine 2009  . Low blood potassium     history  . Crohn's disease     history  . Anemia    Past Surgical History  Procedure Laterality Date  . Tonsillectomy    . Adenoidectomy    . Dilatation & currettage/hysteroscopy with resectocope N/A 06/09/2013    Procedure: DILATATION & CURETTAGE/HYSTEROSCOPY WITH HYSTEROSCOPIC RESECTION OF SUBMUCOSAL FIBROID AND ENDOMETRIAL POLYP;  Surgeon: Serita Kyle, MD;  Location: [facility];  Service: Gynecology;  Laterality: N/A;  . Laparoscopy N/A 06/09/2013    Procedure: LAPAROSCOPY DIAGNOSTIC;  Surgeon: Serita Kyle, MD;  Location: [facility];  Service: Gynecology;  Laterality: N/A;  . Robotic assisted laparoscopic lysis of  adhesion N/A 06/09/2013    Procedure: ROBOTIC ASSISTED LAPAROSCOPIC LYSIS OF ADHESION; Resection of Endometriosis and excision of fibroid;  Surgeon: Serita Kyle, MD;  Location: [facility];  Service: Gynecology;  Laterality: N/A;   Family History  Problem Relation Age of Onset  . Heart disease Mother 39    CHF   History  Substance Use Topics  . Smoking status: Never Smoker   . Smokeless tobacco: Never Used  . Alcohol Use: No   OB History   Grav Para Term Preterm Abortions TAB SAB Ect Mult Living                 Review of Systems  Constitutional: Negative for fever and chills.  HENT: Negative for neck pain and neck stiffness.   Eyes: Negative for pain.  Respiratory: Negative for shortness of breath.   Cardiovascular: Negative for chest pain.  Gastrointestinal: Negative for vomiting.  Genitourinary: Positive for vaginal bleeding and pelvic pain. Negative for dysuria.  Musculoskeletal: Negative for back pain.  Skin: Negative for rash.  Neurological: Negative for seizures.  All other systems reviewed and are negative.    Allergies  Review of patient's allergies indicates no known allergies.  Home Medications   Current Outpatient Rx  Name  Route  Sig  Dispense  Refill  . acetaminophen (TYLENOL) 500 MG tablet   Oral   Take 500 mg by mouth every 6 (six) hours as needed for pain.         . cetirizine (ZYRTEC) 10 MG tablet   Oral  Take 10 mg by mouth daily.         Marland Kitchen doxycycline (VIBRAMYCIN) 50 MG capsule   Oral   Take 2 capsules (100 mg total) by mouth 2 (two) times daily.   28 capsule   0   . etonogestrel-ethinyl estradiol (NUVARING) 0.12-0.015 MG/24HR vaginal ring      Insert vaginally and leave in place for 3 consecutive weeks, then remove for 1 week.   1 each   12     Go to [employer] office and pick up a free sample ...   . FLUoxetine (PROZAC) 20 MG capsule   Oral   Take 20 mg by mouth daily.         Marland Kitchen HYDROmorphone (DILAUDID) 4 MG tablet    Oral   Take 1 tablet (4 mg total) by mouth every 6 (six) hours as needed for pain.   30 tablet   0   . ibuprofen (ADVIL,MOTRIN) 600 MG tablet   Oral   Take 1 tablet (600 mg total) by mouth every 6 (six) hours as needed for pain.   30 tablet   0   . nystatin cream (MYCOSTATIN)   Topical   Apply topically 2 (two) times daily.         . traMADol (ULTRAM) 50 MG tablet   Oral   Take 50-100 mg by mouth every 6 (six) hours as needed for pain.          BP 162/105  Pulse 69  Temp(Src) 98.8 F (37.1 C) (Oral)  Resp 16  SpO2 96%  LMP 05/24/2013 Physical Exam  Constitutional: She is oriented to person, place, and time. She appears well-developed and well-nourished.  HENT:  Head: Normocephalic and atraumatic.  Eyes: EOM are normal. Pupils are equal, round, and reactive to light.  Neck: Neck supple.  Cardiovascular: Normal rate, regular rhythm and intact distal pulses.   Pulmonary/Chest: Effort normal and breath sounds normal. No respiratory distress.  Abdominal: Soft.  Mild distention and tenderness over pelvic region without guarding or rebound. Laparoscopic scars appear well   Musculoskeletal: Normal range of motion. She exhibits no edema.  Neurological: She is alert and oriented to person, place, and time.  Skin: Skin is warm and dry.    ED Course  Procedures (including critical care time) Labs Review Labs Reviewed  CBC - Abnormal; Notable for the following:    HCT 34.9 (*)    All other components within normal limits  POCT I-STAT, CHEM 8 - Abnormal; Notable for the following:    Potassium 3.0 (*)    Calcium, Ion 1.08 (*)    Hemoglobin 11.9 (*)    HCT 35.0 (*)    All other components within normal limits   IV fluids. IV Dilaudid. Potassium for hypokalemia  4:06 AM d/w GYN Dr Madaline Guthrie on call for Dr Cherly Hensen - as above.  She declines admission and recommends provide reassurance and discharge home to continue pain medications and followup on Monday with Dr. Cherly Hensen.    On recheck, reassurance provided. Patient's mother is now requesting a CT scan of her brain. There is a family member with history of seizures and her mother states that patient did have some shaking while sleeping in bed tonight. Patient is unaware of this and has no seizure history. No postictal state.  CT normal. Patient ambulating in ED. No hypertension. Plan followup with OB/GYN. Hypokalemia precautions provided. Dehydration precautions. Patient will take Dilaudid as prescribed as needed  MDM  Diagnosis:  Near-syncope, pelvic pain, vaginal bleeding status post GYN surgery  Evaluated with labs. CT brain negative per RAD Treated IV fluids and IV narcotics  Vital signs and nursing notes reviewed and considered   Sunnie Nielsen, MD 06/13/13 [phone]

## 2013-06-13 NOTE — ED Notes (Signed)
Ambulated pt from bed to door of room before pt. started to feel oozy. Dr. Dierdre Highman made aware. IV fluids running wide open.

## 2013-06-13 NOTE — ED Notes (Signed)
Called 3w to give report ,Report given to [facility]. Patient remains in MRI

## 2013-06-14 DIAGNOSIS — R1031 Right lower quadrant pain: Secondary | ICD-10-CM

## 2013-06-14 DIAGNOSIS — R42 Dizziness and giddiness: Secondary | ICD-10-CM

## 2013-06-14 DIAGNOSIS — R51 Headache: Secondary | ICD-10-CM

## 2013-06-14 LAB — CBC
HCT: 31.9 % — ABNORMAL LOW (ref 36.0–46.0)
Hemoglobin: 10.9 g/dL — ABNORMAL LOW (ref 12.0–15.0)
MCH: 27.9 pg (ref 26.0–34.0)
MCHC: 34.2 g/dL (ref 30.0–36.0)
MCV: 81.8 fL (ref 78.0–100.0)
Platelets: 255 10*3/uL (ref 150–400)
RBC: 3.9 MIL/uL (ref 3.87–5.11)
RDW: 12.8 % (ref 11.5–15.5)
WBC: 5.7 10*3/uL (ref 4.0–10.5)

## 2013-06-14 LAB — COMPREHENSIVE METABOLIC PANEL
ALT: 8 U/L (ref 0–35)
AST: 13 U/L (ref 0–37)
Albumin: 2.8 g/dL — ABNORMAL LOW (ref 3.5–5.2)
Alkaline Phosphatase: 78 U/L (ref 39–117)
BUN: 6 mg/dL (ref 6–23)
CO2: 26 mEq/L (ref 19–32)
Calcium: 8.4 mg/dL (ref 8.4–10.5)
Chloride: 99 mEq/L (ref 96–112)
Creatinine, Ser: 0.63 mg/dL (ref 0.50–1.10)
GFR calc Af Amer: >90 mL/min (ref >90)
GFR calc non Af Amer: >90 mL/min (ref >90)
Glucose, Bld: 85 mg/dL (ref 70–99)
Potassium: 3.1 mEq/L — ABNORMAL LOW (ref 3.5–5.1)
Sodium: 135 mEq/L (ref 135–145)
Total Bilirubin: 0.3 mg/dL (ref 0.3–1.2)
Total Protein: 6.2 g/dL (ref 6.0–8.3)

## 2013-06-14 LAB — TROPONIN I: Troponin I: <0.3 ng/mL (ref <0.30)

## 2013-06-14 MED ORDER — MECLIZINE HCL 32 MG PO TABS: 32.0000 mg | ORAL_TABLET | Freq: Three times a day (TID) | ORAL | Status: DC | PRN

## 2013-06-14 NOTE — Progress Notes (Signed)
Utilization Review completed.  

## 2013-06-14 NOTE — Progress Notes (Signed)
Subjective: Patient has been experiencing recurrent headaches but gets relief with Vicodin. Nausea has improved as has her vertigo on standing. Patient had no new complaints. She has not experienced a recurrence of loss of consciousness.  Objective: Current vital signs: BP 122/71  Pulse 85  Temp(Src) 99.3 F (37.4 C) (Oral)  Resp 15  Ht 5' 7.75" (1.721 m)  Wt 71.079 kg (156 lb 11.2 oz)  BMI 24 kg/m2  SpO2 96%  LMP 05/24/2013  Neurologic Exam: Alert and in no acute distress. Patient is well-oriented to time as well as place. Speech was normal. Vision moved extremities equally with no signs of focal weakness.  MRI study showed no acute intracranial abnormality. Nonspecific white matter changes were noted and were unchanged from 2013.  EEG on 06/13/2013 showed normal awake and sleep activity with no signs of seizure disorder.  Medications: I have reviewed the patient's current medications.  Assessment/Plan: 32 year old lady with headache nausea and vertigo as well as an episode of likely syncope. She's had no recurrence of loss of consciousness or near syncope. Headache, nausea and vertigo have improved.  No further neurological intervention is indicated at this point. Patient may be discharged from the hospital, from a neurological standpoint, if physical therapy feels her gait is sufficiently stable. I will sign off on the care at this point, but remain available for reevaluation if medically indicated.  C.R. Nicole Kindred, MD [facility] [phone]  06/14/2013  12:01 PM

## 2013-06-14 NOTE — Progress Notes (Signed)
   CARE MANAGEMENT NOTE 06/14/2013  Patient:  Diane [name], Diane [name]   Account Number:  [id number]  Date Initiated:  06/14/2013  Documentation initiated by:  [facility]  Subjective/Objective Assessment:   adm:  vertigo     Action/Plan:   discharge planning   Anticipated DC Date:  06/14/2013   Anticipated DC Plan:  HOME/SELF CARE         Choice offered to / List presented to:             Status of service:  Completed, signed off Medicare Important Message given?   (If response is "NO", the following Medicare IM given date fields will be blank) Date Medicare IM given:   Date Additional Medicare IM given:    Discharge Disposition:  HOME/SELF CARE  Per UR Regulation:    If discussed at Long Length of Stay Meetings, dates discussed:    Comments:  06/14/13 15:40 CM to arrange outpt vestibular rehab per MD telephone order.  CM met with pt and gave map and handout for [facility].  Referral was faxed to [facility].  No other CM needs communicated. Freddy Jaksch, BSN, CM [phone].

## 2013-06-14 NOTE — Discharge Summary (Signed)
Physician Discharge Summary  Diane [name] NOB:[id number] DOB: May 14, 1981 DOA: 06/13/2013  PCP: Eulas Post, MD  Admit date: 06/13/2013 Discharge date: 06/14/2013  Time spent: 35 minutes  Recommendations for Outpatient Follow-up:  1. Follow up with PCP in 1-2 weeks 2. Follow up with Neurology as scheduled  Discharge Diagnoses:  Active Problems:   CROHN'S DISEASE   Other constipation   Vertigo   Headache   Syncope   History of endometriosis  Discharge Condition: Stable  Diet recommendation: Regular  Filed Weights   06/13/13 1038  Weight: 71.079 kg (156 lb 11.2 oz)   History of present illness:  Diane [name] is a 32 y.o. female with a history of Crohn's disease, anxiety, anemia and status post gynecologic procedures on 06/09/2013 for endometriosis, dysmenorrhea and dysfunctional uterine bleeding. She presented to the emergency room with complaint of dizziness with subsequent loss of consciousness as well as headache. Patient's mother described an episode of loss of consciousness with shaking of extremities followed by confusion. There is no history of seizure disorder. Patient was seen at [facility] for vaginal bleeding which was brought under control. She subsequently came to the emergency room here for continued lightheadedness and headache. CT scan of her head showed no acute intracranial abnormality. She was given IV fluids as well as Ativan and meclizine. There is no improvement in lightheadedness. She continued to have headache and dizziness/vertigo. She was seen by neurologist and a hospitalization was requested for observation, telemetry, EEG and MRI.   Hospital Course:  The patient was admitted to the floor. Neurology was consulted. An MRI was found to be unremarkable. An EEG was unremarkable. The patient has remained stable without recurrence of syncope.The case was discussed with neurology who feels that the patient is medically stable for discharge home. Of  note, PT was consulted with recs for home health/PT/OT.  Consultations:  Neurology  Discharge Exam: Filed Vitals:   06/13/13 2039 06/14/13 0504 06/14/13 1021 06/14/13 1022  BP: 124/73 131/88  122/71  Pulse: 85 77  85  Temp: 98.7 F (37.1 C) 98.6 F (37 C) 99.4 F (37.4 C) 99.3 F (37.4 C)  TempSrc:  Oral  Oral  Resp: 16 15    Height:      Weight:      SpO2: 99% 99%  96%   General: Awake, in nad Cardiovascular: regular, s1, s2 Respiratory: normal resp effort, no wheezing  Discharge Instructions    Medication List         acetaminophen 500 MG tablet  Commonly known as:  TYLENOL  Take 500 mg by mouth every 6 (six) hours as needed for pain.     cetirizine 10 MG tablet  Commonly known as:  ZYRTEC  Take 10 mg by mouth daily.     doxycycline 50 MG capsule  Commonly known as:  VIBRAMYCIN  Take 2 capsules (100 mg total) by mouth 2 (two) times daily.     etonogestrel-ethinyl estradiol 0.12-0.015 MG/24HR vaginal ring  Commonly known as:  NUVARING  Insert vaginally and leave in place for 3 consecutive weeks, then remove for 1 week.     FLUoxetine 20 MG capsule  Commonly known as:  PROZAC  Take 20 mg by mouth daily.     HYDROmorphone 4 MG tablet  Commonly known as:  DILAUDID  Take 1 tablet (4 mg total) by mouth every 6 (six) hours as needed for pain.     ibuprofen 600 MG tablet  Commonly known as:  ADVIL,MOTRIN  Take 1 tablet (600 mg total) by mouth every 6 (six) hours as needed for pain.     meclizine 32 MG tablet  Commonly known as:  ANTIVERT  Take 1 tablet (32 mg total) by mouth 3 (three) times daily as needed.     nystatin cream  Commonly known as:  MYCOSTATIN  Apply topically 2 (two) times daily.     traMADol 50 MG tablet  Commonly known as:  ULTRAM  Take 50-100 mg by mouth every 6 (six) hours as needed for pain.       No Known Allergies Follow-up Information   Follow up with COUSINS,SHERONETTE A, MD In 2 days.   Specialty:  Obstetrics and  Gynecology   Contact information:   [place] [address] [phone]       Follow up with [place].   Contact information:   [address] [place] [address] [phone]       The results of significant diagnostics from this hospitalization (including imaging, microbiology, ancillary and laboratory) are listed below for reference.    Significant Diagnostic Studies: Ct Head Wo Contrast  06/13/2013   CLINICAL DATA:  Near syncopal episode.  EXAM: CT HEAD WITHOUT CONTRAST  TECHNIQUE: Contiguous axial images were obtained from the base of the skull through the vertex without intravenous contrast.  COMPARISON:  Him all are 12/25/2011  FINDINGS: Layering debris in the sphenoid sinus. There is no evidence of acute intracranial hemorrhage, brain edema, mass lesion, acute infarction, mass effect, or midline shift. Acute infarct may be in apparent on noncontrast CT. No other intra-axial abnormalities are seen, and the ventricles and sulci are within normal limits in size and symmetry. No abnormal extra-axial fluid collections or masses are identified. No significant calvarial abnormality.  IMPRESSION: Negative for bleed or other acute intracranial process.   Electronically Signed   By: Arne Cleveland M.D.   On: 06/13/2013 06:40   Mr Brain Wo Contrast  06/13/2013   CLINICAL DATA:  Vertigo. Syncope  EXAM: MRI HEAD WITHOUT CONTRAST  TECHNIQUE: Multiplanar, multisequence MR imaging was performed. No intravenous contrast was administered.  COMPARISON:  CT 06/13/2013. MRI December 25, 2011  FINDINGS: Ventricle size is normal. Pituitary normal in size. Craniocervical junction is normal.  5 mm hyperintensity left frontal white matter. 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 Small hyperintensities in the subcortical frontal white matter bilaterally. 5 mm hyperintensity right subinsular white matter,  unchanged. No periventricular white matter lesions.  Diffusion-weighted imaging is negative for acute infarct. Negative for hemorrhage or mass. No fluid collection.  Mucosal edema in the paranasal sinuses. Air-fluid level left sphenoid sinus. Vessels of the base of brain are patent.  IMPRESSION: Small white matter hyperintensities in the frontal lobes bilaterally. These are unchanged from 2013. Differential diagnosis includes chronic microvascular ischemia, complex migraine headache, and multiple sclerosis  Sinusitis with air-fluid level in the sphenoid sinus.   Electronically Signed   By: Franchot Gallo M.D.   On: 06/13/2013 10:19    Microbiology: No results found for this or any previous visit (from the past 240 hour(s)).   Labs: Basic Metabolic Panel:  Recent Labs Lab 06/13/13 0127 06/13/13 1442 06/14/13 0114  NA 139  --  135  K 3.0*  --  3.1*  CL 101  --  99  CO2  --   --  26  GLUCOSE 88  --  85  BUN 10  --  6  CREATININE 0.70 0.60 0.63  CALCIUM  --   --  8.4   Liver Function Tests:  Recent Labs Lab 06/14/13 0114  AST 13  ALT 8  ALKPHOS 78  BILITOT 0.3  PROT 6.2  ALBUMIN 2.8*   No results found for this basename: LIPASE, AMYLASE,  in the last 168 hours No results found for this basename: AMMONIA,  in the last 168 hours CBC:  Recent Labs Lab 06/12/13 0917 06/13/13 0127 06/13/13 0135 06/13/13 1442 06/14/13 0114  WBC 6.1  --  6.7 5.7 5.7  HGB 11.3* 11.9* 12.0 11.3* 10.9*  HCT 33.3* 35.0* 34.9* 32.6* 31.9*  MCV 81.6  --  81.7 81.5 81.8  PLT 244  --  285 257 255   Cardiac Enzymes:  Recent Labs Lab 06/13/13 1442 06/13/13 1917 06/14/13 0114  TROPONINI <0.30 <0.30 <0.30   BNP: BNP (last 3 results) No results found for this basename: PROBNP,  in the last 8760 hours CBG: No results found for this basename: GLUCAP,  in the last 168 hours  Signed:  CHIU, STEPHEN K  [facility] 06/14/2013, 1:26 PM

## 2013-06-14 NOTE — Progress Notes (Signed)
Physical Therapy Treatment Patient Details Name: Diane [name] MRN: [id number] DOB: 1981-01-01 Today's Date: 06/14/2013 Time: 0865-7846 PT Time Calculation (min): 28 min  PT Assessment / Plan / Recommendation  History of Present Illness Diane [name] is an 32 y.o. female with a history of Crohn's disease, anxiety, anemia and status post gynecologic procedures on 06/09/2013 4 dysmenorrhea and dysfunctional uterine bleeding, presented to the emergency room with complaint of dizziness with subsequent loss of consciousness as well as headache. Patient's mother described an episode of loss of consciousness with shaking of extremities followed by confusion. There is no history of seizure disorder. Patient was seen at [facility] for vaginal bleeding which was brought under control. She subsequently came to the emergency room here for continued lightheadedness and headache.   PT Comments   Pt presents with history of right ear infections and has a tube currently in place in ear.  History of episodic dizziness x2 years coincides with exacerbated ear problems and suspect the two are related.  S/s vertigo consistent with a poorly compensated right peripheral vestibular hypofunction that should respond to therapy if pt is compliant with rehab.  Recommend OPPT at neurorehab clinic/clinic that treats vestibular impairments.  Recommend pt is counseled to use anti-vertiginous medication PRN only in order to allow maximum benefit from vestibular rehabilitation interventions as OP.   Follow Up Recommendations  Outpatient PT;Supervision - Intermittent     Does the patient have the potential to tolerate intense rehabilitation     Barriers to Discharge        Equipment Recommendations  None recommended by PT    Recommendations for Other Services    Frequency     Progress towards PT Goals Progress towards PT goals: Progressing toward goals  Plan Discharge plan needs to be updated    Precautions /  Restrictions Precautions Precautions: Fall   Pertinent Vitals/Pain Abdomen, incisional with side<>sit exercises    Mobility  Bed Mobility Bed Mobility: Rolling Right;Rolling Left;Right Sidelying to Sit;Left Sidelying to Sit;Supine to Sit;Sit to Supine Rolling Right: 5: Supervision Rolling Left: 5: Supervision Right Sidelying to Sit: 4: Min assist Left Sidelying to Sit: 4: Min assist Details for Bed Mobility Assistance: assist for positioning for vestibular testing and to manage abdominal pain from recent surgery Transfers Transfers: Sit to Stand;Stand to Sit Sit to Stand: 5: Supervision Stand to Sit: 5: Supervision Ambulation/Gait Ambulation/Gait Assistance: 5: Supervision Ambulation Distance (Feet): 300 Feet Ambulation/Gait Assistance Details: assess for instability with head turns and for speed Gait Pattern: Step-through pattern;Decreased step length - right;Decreased step length - left;Decreased stride length;Shuffle;Trunk flexed Gait velocity: Slow gait speed General Gait Details: pt educated on slow vs. fast gait and balance challenges with either.  Instructed to increase speed, does not consistently maintain    Exercises     PT Diagnosis:    PT Problem List:   PT Treatment Interventions:     PT Goals (current goals can now be found in the care plan section)    Visit Information  Last PT Received On: 06/14/13 Assistance Needed: +1 History of Present Illness: Diane [name] is an 32 y.o. female with a history of Crohn's disease, anxiety, anemia and status post gynecologic procedures on 06/09/2013 4 dysmenorrhea and dysfunctional uterine bleeding, presented to the emergency room with complaint of dizziness with subsequent loss of consciousness as well as headache. Patient's mother described an episode of loss of consciousness with shaking of extremities followed by confusion. There is no history of seizure disorder.  Patient was seen at [facility] for vaginal bleeding  which was brought under control. She subsequently came to the emergency room here for continued lightheadedness and headache.    Subjective Data  Subjective: I only feel dizzy when I lay down   Cognition  Cognition Arousal/Alertness: Awake/alert Behavior During Therapy: WFL for tasks assessed/performed Overall Cognitive Status: Within Functional Limits for tasks assessed    Balance  Balance Balance Assessed: Yes Standardized Balance Assessment Standardized Balance Assessment: Vestibular Evaluation High Level Balance High Level Balance Comments: Pt assessed for BPPV with no nystagmus despite symptomatic sit to sup and roll to right.  VOR slow and head thrust both negative, walking with head turns did not provoke but pt not completely compliant with instruction.  Pt relates 2 year history of episodic dizziness may or may not be associated with exacerbation of right ear infection and currently had tube in right ear.  Instructed on habituation exercises but had to modify due to pain from abdominal incision.  Perform 2-5 sets Austin Miles 3-4 times per day  End of Session PT - End of Session Activity Tolerance: Patient tolerated treatment well Patient left: in bed;with family/visitor present   GP Mobility: Walking and Moving Around Current Status (Y7829): At least 1 percent but less than 20 percent impaired, limited or restricted   Dennis Bast 06/14/2013, 2:34 PM

## 2013-06-15 NOTE — Care Management (Signed)
Received call from patient and her mother.  They state the patient cannot go to outpatient rehab because of the amount of stairs in her home.  I explained she would need to call her MD for a home health referral.

## 2013-06-19 ENCOUNTER — Encounter: Payer: Self-pay | Admitting: Neurology

## 2013-06-19 ENCOUNTER — Ambulatory Visit (INDEPENDENT_AMBULATORY_CARE_PROVIDER_SITE_OTHER): Payer: 59 | Admitting: Neurology

## 2013-06-19 VITALS — BP 116/69 | HR 70 | Ht 67.0 in | Wt 159.0 lb

## 2013-06-19 DIAGNOSIS — R55 Syncope and collapse: Secondary | ICD-10-CM

## 2013-06-19 DIAGNOSIS — G43019 Migraine without aura, intractable, without status migrainosus: Secondary | ICD-10-CM

## 2013-06-19 HISTORY — DX: Migraine without aura, intractable, without status migrainosus: G43.019

## 2013-06-19 MED ORDER — TOPIRAMATE 25 MG PO TABS: ORAL_TABLET | ORAL | Status: DC

## 2013-06-19 MED ORDER — RIZATRIPTAN BENZOATE 10 MG PO TBDP: 10.0000 mg | ORAL_TABLET | Freq: Three times a day (TID) | ORAL | Status: DC | PRN

## 2013-06-19 NOTE — Progress Notes (Signed)
Reason for visit: Headache  Diane [name] is a 32 y.o. female  History of present illness:  Diane [name] is a 32 year old right-handed black female with a history of migraine headaches dating back 4 or 5 years. The patient indicates that her headaches are relatively infrequent, occurring 4 or 5 times a week. The headaches may begin on the left or the right frontotemporal region, and may spread around the head. The patient may have some occasional neck stiffness with headache. The patient oftentimes has nausea, but she usually does not vomit with the headache. The patient denies any significant visual disturbances with the headache. The patient will occasionally have some tingling sensations in the fingertips and toes. The patient was seen previously by Dr. Modesto Charon for left foot numbness that would come and go. The patient reports some mild gait instability, but she denies problems controlling the bowels or the bladder. The patient does have photophobia and phonophobia with the headache. The patient indicates that she rarely misses work because of the headache. The patient will take Motrin or Ultram for the headache. Opiate medications such as Dilaudid worsen the headache when the medication wears off. The patient recently was in the hospital on 06/13/2013 following a syncopal event. The patient felt hot, clammy, lightheaded. The patient tried to stand up to get her mother, and then blacked out. The patient indicates that she has had multiple episodes of near-syncope in the past, but she usually will sit down or lie down, and she avoids an actual syncopal event. The patient recently had surgery on 06/09/2013, and she has had daily headaches since that time. The patient underwent a workup in hospital that included MRI evaluation of the brain that shows minimal white matter changes in the left frontal area consistent with a history of migraine. The patient had an EEG study that was normal. The patient has never  been on any medication on a daily basis to prevent her headaches. The patient is sent to this office for an evaluation.  Past Medical History  Diagnosis Date  . Crohn's disease   . PONV (postoperative nausea and vomiting)   . Pneumonia     history back in 2006  . Heart murmur     as child, never had any problems  . Anxiety   . Seasonal allergies   . Headache(784.0)     otc meds - last migraine 2009  . Low blood potassium     history  . Crohn's disease     history  . Anemia   . History of endometriosis   . Migraine without aura, with intractable migraine, so stated, without mention of status migrainosus 06/19/2013  . Vasovagal syncope     Past Surgical History  Procedure Laterality Date  . Tonsillectomy    . Adenoidectomy    . Dilatation & currettage/hysteroscopy with resectocope N/A 06/09/2013    Procedure: DILATATION & CURETTAGE/HYSTEROSCOPY WITH HYSTEROSCOPIC RESECTION OF SUBMUCOSAL FIBROID AND ENDOMETRIAL POLYP;  Surgeon: Serita Kyle, MD;  Location: [facility];  Service: Gynecology;  Laterality: N/A;  . Laparoscopy N/A 06/09/2013    Procedure: LAPAROSCOPY DIAGNOSTIC;  Surgeon: Serita Kyle, MD;  Location: [facility];  Service: Gynecology;  Laterality: N/A;  . Robotic assisted laparoscopic lysis of adhesion N/A 06/09/2013    Procedure: ROBOTIC ASSISTED LAPAROSCOPIC LYSIS OF ADHESION; Resection of Endometriosis and excision of fibroid;  Surgeon: Serita Kyle, MD;  Location: [facility];  Service: Gynecology;  Laterality: N/A;    Family History  Problem Relation Age of Onset  . Heart disease Mother 68    CHF  . Crohn's disease Mother   . Asthma Brother     Social history:  reports that she has never smoked. She has never used smokeless tobacco. She reports that she does not drink alcohol or use illicit drugs.  Medications:  Current Outpatient Prescriptions on File Prior to Visit  Medication Sig Dispense Refill  . acetaminophen (TYLENOL) 500 MG tablet Take 500  mg by mouth every 6 (six) hours as needed for pain.      . cetirizine (ZYRTEC) 10 MG tablet Take 10 mg by mouth daily.      Marland Kitchen doxycycline (VIBRAMYCIN) 50 MG capsule Take 2 capsules (100 mg total) by mouth 2 (two) times daily.  28 capsule  0  . etonogestrel-ethinyl estradiol (NUVARING) 0.12-0.015 MG/24HR vaginal ring Insert vaginally and leave in place for 3 consecutive weeks, then remove for 1 week.  1 each  12  . FLUoxetine (PROZAC) 20 MG capsule Take 20 mg by mouth daily.      Marland Kitchen HYDROmorphone (DILAUDID) 4 MG tablet Take 1 tablet (4 mg total) by mouth every 6 (six) hours as needed for pain.  30 tablet  0  . ibuprofen (ADVIL,MOTRIN) 600 MG tablet Take 1 tablet (600 mg total) by mouth every 6 (six) hours as needed for pain.  30 tablet  0  . meclizine (ANTIVERT) 32 MG tablet Take 1 tablet (32 mg total) by mouth 3 (three) times daily as needed.  30 tablet  0  . nystatin cream (MYCOSTATIN) Apply topically 2 (two) times daily.      . traMADol (ULTRAM) 50 MG tablet Take 50-100 mg by mouth every 6 (six) hours as needed for pain.       No current facility-administered medications on file prior to visit.     No Known Allergies  ROS:  Out of a complete 14 system review of symptoms, the patient complains only of the following symptoms, and all other reviewed systems are negative.  Weight loss, fatigue Heart murmur Itching Anemia Feeling hot Headache, weakness, dizziness, syncope  Blood pressure 116/69, pulse 70, height 5\' 7"  (1.702 m), weight 159 lb (72.122 kg), last menstrual period 05/24/2013.  Physical Exam  General: The patient is alert and cooperative at the time of the examination.  Head: Pupils are equal, round, and reactive to light. Discs are flat bilaterally.  Neck: The neck is supple, no carotid bruits are noted.  Respiratory: The respiratory examination is clear.  Cardiovascular: The cardiovascular examination reveals a regular rate and rhythm, no obvious murmurs or rubs are  noted.  Neuromuscular: The patient has good range of movement of the cervical spine. No crepitus is noted in the temporomandibular joints.  Skin: Extremities are without significant edema.  Neurologic Exam  Mental status:  Cranial nerves: Facial symmetry is present. There is good sensation of the face to pinprick and soft touch bilaterally. The strength of the facial muscles and the muscles to head turning and shoulder shrug are normal bilaterally. Speech is well enunciated, no aphasia or dysarthria is noted. Extraocular movements are full, with the exception that with superior gaze, there is some divergence of the right eye. Visual fields are full.  Motor: The motor testing reveals 5 over 5 strength of all 4 extremities. Good symmetric motor tone is noted throughout.  Sensory: Sensory testing is intact to pinprick, soft touch, vibration sensation, and position sense on all 4 extremities, with the exception  that there is a mild decrease in pinprick sensation on the left hand and left leg. No evidence of extinction is noted.  Coordination: Cerebellar testing reveals good finger-nose-finger and heel-to-shin bilaterally.  Gait and station: Gait is normal. Tandem gait is normal. Romberg is negative. No drift is seen.  Reflexes: Deep tendon reflexes are symmetric and normal bilaterally. Toes are downgoing bilaterally.   Assessment/Plan:  1. Vasovagal syncope  2. Migraine headache  The patient has been having very frequent headaches. At this point, we will initiate medications to help prevent the headache. The patient will go on Topamax, and she will be given Maxalt to take if needed for the headache. The patient does report some dizziness with the headaches. The patient has had frequent episodes of near-syncope in the past associated with vasovagal events. I have indicated that she is to keep her head down if she starts feeling poorly. The patient blacked out recently because she tried to  stand up. The patient followup through this office in 4 months.  Marlan Palau MD 06/20/2013 4:41 PM  [facility] [address] [place], [place] [address]  Phone [phone] Fax [phone]

## 2013-06-19 NOTE — Patient Instructions (Signed)
With the Topamax, look out for tingling of the fingers and toes, and weight loss, concentration problems. Carbonated drinks will taste bad.

## 2013-08-13 ENCOUNTER — Emergency Department (HOSPITAL_COMMUNITY): Payer: 59

## 2013-08-13 ENCOUNTER — Emergency Department (HOSPITAL_COMMUNITY)
Admission: EM | Admit: 2013-08-13 | Discharge: 2013-08-14 | Disposition: A | Discharge status: Home / Self Care | Payer: 59 | Attending: Emergency Medicine | Admitting: Emergency Medicine

## 2013-08-13 ENCOUNTER — Encounter (HOSPITAL_COMMUNITY): Payer: Self-pay | Admitting: Emergency Medicine

## 2013-08-13 DIAGNOSIS — Z8701 Personal history of pneumonia (recurrent): Secondary | ICD-10-CM | POA: Insufficient documentation

## 2013-08-13 DIAGNOSIS — Z8742 Personal history of other diseases of the female genital tract: Secondary | ICD-10-CM | POA: Insufficient documentation

## 2013-08-13 DIAGNOSIS — R011 Cardiac murmur, unspecified: Secondary | ICD-10-CM | POA: Insufficient documentation

## 2013-08-13 DIAGNOSIS — M79605 Pain in left leg: Secondary | ICD-10-CM

## 2013-08-13 DIAGNOSIS — Z79899 Other long term (current) drug therapy: Secondary | ICD-10-CM | POA: Insufficient documentation

## 2013-08-13 DIAGNOSIS — M25559 Pain in unspecified hip: Secondary | ICD-10-CM | POA: Insufficient documentation

## 2013-08-13 DIAGNOSIS — F411 Generalized anxiety disorder: Secondary | ICD-10-CM | POA: Insufficient documentation

## 2013-08-13 DIAGNOSIS — Z8719 Personal history of other diseases of the digestive system: Secondary | ICD-10-CM | POA: Insufficient documentation

## 2013-08-13 DIAGNOSIS — R269 Unspecified abnormalities of gait and mobility: Secondary | ICD-10-CM | POA: Insufficient documentation

## 2013-08-13 DIAGNOSIS — G43019 Migraine without aura, intractable, without status migrainosus: Secondary | ICD-10-CM | POA: Insufficient documentation

## 2013-08-13 DIAGNOSIS — Z8639 Personal history of other endocrine, nutritional and metabolic disease: Secondary | ICD-10-CM | POA: Insufficient documentation

## 2013-08-13 DIAGNOSIS — Z862 Personal history of diseases of the blood and blood-forming organs and certain disorders involving the immune mechanism: Secondary | ICD-10-CM | POA: Insufficient documentation

## 2013-08-13 LAB — CBC WITH DIFFERENTIAL/PLATELET
Basophils Absolute: 0 10*3/uL (ref 0.0–0.1)
Basophils Relative: 0 % (ref 0–1)
Eosinophils Absolute: 0.1 10*3/uL (ref 0.0–0.7)
Eosinophils Relative: 1 % (ref 0–5)
HCT: 36.1 % (ref 36.0–46.0)
Hemoglobin: 12.4 g/dL (ref 12.0–15.0)
Lymphocytes Relative: 14 % (ref 12–46)
Lymphs Abs: 1.2 10*3/uL (ref 0.7–4.0)
MCH: 28.6 pg (ref 26.0–34.0)
MCHC: 34.3 g/dL (ref 30.0–36.0)
MCV: 83.4 fL (ref 78.0–100.0)
Monocytes Absolute: 0.3 10*3/uL (ref 0.1–1.0)
Monocytes Relative: 3 % (ref 3–12)
Neutro Abs: 7.3 10*3/uL (ref 1.7–7.7)
Neutrophils Relative %: 82 % — ABNORMAL HIGH (ref 43–77)
Platelets: 270 10*3/uL (ref 150–400)
RBC: 4.33 MIL/uL (ref 3.87–5.11)
RDW: 13.8 % (ref 11.5–15.5)
WBC: 8.9 10*3/uL (ref 4.0–10.5)

## 2013-08-13 LAB — D-DIMER, QUANTITATIVE: D-Dimer, Quant: 1.76 ug/mL-FEU — ABNORMAL HIGH (ref 0.00–0.48)

## 2013-08-13 MED ORDER — MORPHINE SULFATE 4 MG/ML IJ SOLN
4.0000 mg | Freq: Once | INTRAMUSCULAR | Status: AC
  Administered 2013-08-13: 4 mg via INTRAVENOUS
  Filled 2013-08-13: qty 1

## 2013-08-13 MED ORDER — ENOXAPARIN SODIUM 80 MG/0.8ML ~~LOC~~ SOLN
1.0000 mg/kg | Freq: Once | SUBCUTANEOUS | Status: AC
  Administered 2013-08-13: 75 mg via SUBCUTANEOUS
  Filled 2013-08-13: qty 0.8

## 2013-08-13 MED ORDER — OXYCODONE-ACETAMINOPHEN 5-325 MG PO TABS
1.0000 | ORAL_TABLET | Freq: Once | ORAL | Status: AC
  Administered 2013-08-13: 1 via ORAL
  Filled 2013-08-13: qty 1

## 2013-08-13 NOTE — ED Notes (Signed)
Pt complains of left inner thigh pain since this morning, worse while at work, went home early and pain got worse, called EMS because she was unable to get off the couch, no injury

## 2013-08-14 ENCOUNTER — Telehealth (HOSPITAL_COMMUNITY): Payer: Self-pay | Admitting: Emergency Medicine

## 2013-08-14 ENCOUNTER — Ambulatory Visit (HOSPITAL_COMMUNITY)
Admission: RE | Admit: 2013-08-14 | Discharge: 2013-08-14 | Disposition: A | Discharge status: Home / Self Care | Payer: 59 | Source: Ambulatory Visit | Attending: Emergency Medicine | Admitting: Emergency Medicine

## 2013-08-14 DIAGNOSIS — M79609 Pain in unspecified limb: Secondary | ICD-10-CM

## 2013-08-14 MED ORDER — KETOROLAC TROMETHAMINE 30 MG/ML IJ SOLN
30.0000 mg | Freq: Once | INTRAMUSCULAR | Status: AC
  Administered 2013-08-14: 30 mg via INTRAVENOUS
  Filled 2013-08-14: qty 1

## 2013-08-14 MED ORDER — IBUPROFEN 800 MG PO TABS: 800.0000 mg | ORAL_TABLET | Freq: Three times a day (TID) | ORAL | Status: DC

## 2013-08-14 MED ORDER — CYCLOBENZAPRINE HCL 10 MG PO TABS: 10.0000 mg | ORAL_TABLET | Freq: Three times a day (TID) | ORAL | Status: DC | PRN

## 2013-08-14 MED ORDER — OXYCODONE-ACETAMINOPHEN 5-325 MG PO TABS
2.0000 | ORAL_TABLET | Freq: Once | ORAL | Status: AC
  Administered 2013-08-14: 2 via ORAL
  Filled 2013-08-14: qty 2

## 2013-08-14 NOTE — ED Notes (Signed)
Pt ambulated from bed to door of room and was limping and cried due to pain in left leg. MD aware.

## 2013-08-14 NOTE — ED Provider Notes (Addendum)
CSN: [id number]     Arrival date & time 08/13/13  2106 History   First MD Initiated Contact with Patient 08/13/13 2121     Chief Complaint  Patient presents with  . Leg Pain   (Consider location/radiation/quality/duration/timing/severity/associated sxs/prior Treatment) HPI  This is a 32 year old female who presents with left leg pain. Patient reports progressively worsening left thigh pain that started today. She denies any injury. She reports that it starts in her thigh radiates down into her knee. She denies any leg swelling. She denies any history of blood clots. She is on Lupron recently discontinued birth control.  Patient denies any falls. The pain is worse with ambulation and she states she's been unable to ambulate prior to arrival. She denies any back pains or fever.  Past Medical History  Diagnosis Date  . Crohn's disease   . PONV (postoperative nausea and vomiting)   . Pneumonia     history back in 2006  . Heart murmur     as child, never had any problems  . Anxiety   . Seasonal allergies   . Headache(784.0)     otc meds - last migraine 2009  . Low blood potassium     history  . Crohn's disease     history  . Anemia   . History of endometriosis   . Migraine without aura, with intractable migraine, so stated, without mention of status migrainosus 06/19/2013  . Vasovagal syncope    Past Surgical History  Procedure Laterality Date  . Tonsillectomy    . Adenoidectomy    . Dilatation & currettage/hysteroscopy with resectocope N/A 06/09/2013    Procedure: DILATATION & CURETTAGE/HYSTEROSCOPY WITH HYSTEROSCOPIC RESECTION OF SUBMUCOSAL FIBROID AND ENDOMETRIAL POLYP;  Surgeon: Serita Kyle, MD;  Location: [facility];  Service: Gynecology;  Laterality: N/A;  . Laparoscopy N/A 06/09/2013    Procedure: LAPAROSCOPY DIAGNOSTIC;  Surgeon: Serita Kyle, MD;  Location: [facility];  Service: Gynecology;  Laterality: N/A;  . Robotic assisted laparoscopic lysis of adhesion N/A  06/09/2013    Procedure: ROBOTIC ASSISTED LAPAROSCOPIC LYSIS OF ADHESION; Resection of Endometriosis and excision of fibroid;  Surgeon: Serita Kyle, MD;  Location: [facility];  Service: Gynecology;  Laterality: N/A;   Family History  Problem Relation Age of Onset  . Heart disease Mother 51    CHF  . Crohn's disease Mother   . Asthma Brother    History  Substance Use Topics  . Smoking status: Never Smoker   . Smokeless tobacco: Never Used  . Alcohol Use: No   OB History   Grav Para Term Preterm Abortions TAB SAB Ect Mult Living                 Review of Systems  Constitutional: Negative for fever.  Respiratory: Negative for cough, chest tightness and shortness of breath.   Cardiovascular: Negative for chest pain.  Gastrointestinal: Negative for nausea, vomiting and abdominal pain.  Genitourinary: Negative for dysuria.  Musculoskeletal: Positive for gait problem. Negative for back pain.       Left lower extremity pain  Skin: Negative for wound.  Neurological: Negative for headaches.  Psychiatric/Behavioral: Negative for confusion.  All other systems reviewed and are negative.    Allergies  Review of patient's allergies indicates no known allergies.  Home Medications   Current Outpatient Rx  Name  Route  Sig  Dispense  Refill  . acetaminophen (TYLENOL) 500 MG tablet   Oral   Take 500 mg by mouth  every 6 (six) hours as needed for pain.         . cetirizine (ZYRTEC) 10 MG tablet   Oral   Take 10 mg by mouth daily.         Marland Kitchen FLUoxetine (PROZAC) 20 MG capsule   Oral   Take 20 mg by mouth daily.         Marland Kitchen HYDROmorphone (DILAUDID) 4 MG tablet   Oral   Take 1 tablet (4 mg total) by mouth every 6 (six) hours as needed for pain.   30 tablet   0   . ibuprofen (ADVIL,MOTRIN) 600 MG tablet   Oral   Take 1 tablet (600 mg total) by mouth every 6 (six) hours as needed for pain.   30 tablet   0   . meclizine (ANTIVERT) 32 MG tablet   Oral   Take 1 tablet  (32 mg total) by mouth 3 (three) times daily as needed.   30 tablet   0   . nystatin cream (MYCOSTATIN)   Topical   Apply topically 2 (two) times daily.         . rizatriptan (MAXALT-MLT) 10 MG disintegrating tablet   Oral   Take 1 tablet (10 mg total) by mouth 3 (three) times daily as needed for migraine. May repeat in 2 hours if needed   12 tablet   5   . traMADol (ULTRAM) 50 MG tablet   Oral   Take 50-100 mg by mouth every 6 (six) hours as needed for pain.         . cyclobenzaprine (FLEXERIL) 10 MG tablet   Oral   Take 1 tablet (10 mg total) by mouth 3 (three) times daily as needed for muscle spasms.   10 tablet   0   . ibuprofen (ADVIL,MOTRIN) 800 MG tablet   Oral   Take 1 tablet (800 mg total) by mouth 3 (three) times daily.   21 tablet   0    BP 145/89  Pulse 81  Temp(Src) 98.6 F (37 C) (Oral)  Resp 16  Ht 5\' 7"  (1.702 m)  Wt 160 lb (72.576 kg)  BMI 25.05 kg/m2  SpO2 96%  LMP 08/12/2013 Physical Exam  Nursing note and vitals reviewed. Constitutional: She is oriented to person, place, and time. She appears well-developed and well-nourished. No distress.  HENT:  Head: Normocephalic and atraumatic.  Cardiovascular: Normal rate and regular rhythm.   Murmur heard. Pulmonary/Chest: Effort normal. No respiratory distress.  Abdominal: Soft. Bowel sounds are normal. There is no tenderness.  Musculoskeletal: Normal range of motion. She exhibits no edema.  Focused examination of the left lower extremity, patient has tenderness to palpation diffusely over the left thigh. Hip range of motion limited secondary to pain  Neurological: She is alert and oriented to person, place, and time.  Skin: Skin is warm and dry.  Psychiatric: She has a normal mood and affect.    ED Course  Procedures (including critical care time) Labs Review Labs Reviewed  D-DIMER, QUANTITATIVE - Abnormal; Notable for the following:    D-Dimer, Quant 1.76 (*)    All other components  within normal limits  CBC WITH DIFFERENTIAL - Abnormal; Notable for the following:    Neutrophils Relative % 82 (*)    All other components within normal limits   Imaging Review Dg Hip Complete Left  08/13/2013   CLINICAL DATA:  Left hip and groin pain.  EXAM: LEFT HIP - COMPLETE 2+ VIEW  COMPARISON:  05/03/2005 CT scan  FINDINGS: Mild spurring of both femoral heads. No fracture or acute bony findings. Scattered punctate bony densities favoring osteopoikilosis.  IMPRESSION: 1. Mild spurring of both femoral heads without acute findings. 2. Osteopoikilosis.   Electronically Signed   By: Herbie Baltimore M.D.   On: 08/13/2013 22:45    EKG Interpretation   None       MDM   1. Leg pain, left     Patient presents with acute worsening left lower extremity pain. There is no evidence of injury. Patient has recently been on estrogen and is on Lupron but otherwise she is low risk for DVT. D-dimer was sent. Plain films are negative for acute fracture. Patient was given Percocet but continues to complain of pain.  D-dimer is positive. We will give the patient Lovenox. Patient was able to ambulate prior to discharge. She will followup tomorrow for ultrasound of the left lower extremity.  After history, exam, and medical workup I feel the patient has been appropriately medically screened and is safe for discharge home. Pertinent diagnoses were discussed with the patient. Patient was given return precautions.   Shon Baton, MD 08/14/13 1306  Shon Baton, MD 08/14/13 [id number]

## 2013-08-14 NOTE — ED Notes (Signed)
Call from Vascular. Doppler Scan Negative.

## 2013-08-14 NOTE — Progress Notes (Signed)
VASCULAR LAB PRELIMINARY  PRELIMINARY  PRELIMINARY  PRELIMINARY  Left lower extremity venous duplex completed.    Preliminary report:  Left:  No evidence of DVT, superficial thrombosis, or Baker's cyst.  Krisandra Bueno, RVS 08/14/2013, 3:29 PM

## 2013-08-28 ENCOUNTER — Encounter: Payer: Self-pay | Admitting: Nurse Practitioner

## 2013-08-31 ENCOUNTER — Encounter: Payer: Self-pay | Admitting: Nurse Practitioner

## 2013-09-14 ENCOUNTER — Ambulatory Visit: Payer: 59 | Attending: Internal Medicine | Admitting: Physical Therapy

## 2013-09-14 DIAGNOSIS — R42 Dizziness and giddiness: Secondary | ICD-10-CM | POA: Insufficient documentation

## 2013-09-14 DIAGNOSIS — IMO0001 Reserved for inherently not codable concepts without codable children: Secondary | ICD-10-CM | POA: Insufficient documentation

## 2013-09-29 ENCOUNTER — Ambulatory Visit: Payer: 59 | Admitting: Physical Therapy

## 2013-10-20 ENCOUNTER — Ambulatory Visit: Payer: 59 | Admitting: Nurse Practitioner

## 2013-10-27 ENCOUNTER — Encounter: Payer: Self-pay | Admitting: Family Medicine

## 2013-10-27 ENCOUNTER — Ambulatory Visit (INDEPENDENT_AMBULATORY_CARE_PROVIDER_SITE_OTHER): Payer: 59 | Admitting: Family Medicine

## 2013-10-27 VITALS — BP 122/80 | HR 82 | Temp 98.1°F | Wt 170.0 lb

## 2013-10-27 DIAGNOSIS — E876 Hypokalemia: Secondary | ICD-10-CM

## 2013-10-27 DIAGNOSIS — R202 Paresthesia of skin: Secondary | ICD-10-CM

## 2013-10-27 DIAGNOSIS — R209 Unspecified disturbances of skin sensation: Secondary | ICD-10-CM

## 2013-10-27 NOTE — Addendum Note (Signed)
Addended by: Elmer Picker on: 10/27/2013 09:10 AM   Modules accepted: Orders

## 2013-10-27 NOTE — Progress Notes (Signed)
Subjective:    Patient ID: Diane [name], female    DOB: 08-12-1981, 33 y.o.   MRN: [id number]  HPI Patient seen with local dysesthesias involving right lower lip. Onset Sunday 2 days ago. She's not noted any lip swelling. No intraoral numbness. No facial weakness. No change in taste. Distribution includes right lower lip in mental nerve distribution. No history of similar occurrence though she's had some vague dysesthesias involving the extremities the past. She has history of chronic migraines but denies any recent headache. No speech changes. No exacerbating features. No alleviating features.  She's been followed actively by neurology and had an MRI scan in September of 2014 which showed some chronic nonspecific white matter changes. These were felt to be related to previous migraines.  This scan was unchanged from prior x-rays 2013  She has chronic hypokalemia. She does not take any diuretics. No history of chronic diarrhea. No bulimia or recurrent emesis. She's been on potassium replacement in the past but cannot swallow tablets adequately. Last potassium on record 3.1 several months ago. She has some chronic intermittent leg cramps.  Past Medical History  Diagnosis Date  . Crohn's disease   . PONV (postoperative nausea and vomiting)   . Pneumonia     history back in 2006  . Heart murmur     as child, never had any problems  . Anxiety   . Seasonal allergies   . Headache(784.0)     otc meds - last migraine 2009  . Low blood potassium     history  . Crohn's disease     history  . Anemia   . History of endometriosis   . Migraine without aura, with intractable migraine, so stated, without mention of status migrainosus 06/19/2013  . Vasovagal syncope    Past Surgical History  Procedure Laterality Date  . Tonsillectomy    . Adenoidectomy    . Dilatation & currettage/hysteroscopy with resectocope N/A 06/09/2013    Procedure: DILATATION & CURETTAGE/HYSTEROSCOPY WITH HYSTEROSCOPIC  RESECTION OF SUBMUCOSAL FIBROID AND ENDOMETRIAL POLYP;  Surgeon: Marvene Staff, MD;  Location: [facility];  Service: Gynecology;  Laterality: N/A;  . Laparoscopy N/A 06/09/2013    Procedure: LAPAROSCOPY DIAGNOSTIC;  Surgeon: Marvene Staff, MD;  Location: [facility];  Service: Gynecology;  Laterality: N/A;  . Robotic assisted laparoscopic lysis of adhesion N/A 06/09/2013    Procedure: ROBOTIC ASSISTED LAPAROSCOPIC LYSIS OF ADHESION; Resection of Endometriosis and excision of fibroid;  Surgeon: Marvene Staff, MD;  Location: [facility];  Service: Gynecology;  Laterality: N/A;    reports that she has never smoked. She has never used smokeless tobacco. She reports that she does not drink alcohol or use illicit drugs. family history includes Asthma in her brother; Crohn's disease in her mother; Heart disease (age of onset: 53) in her mother. No Known Allergies    Review of Systems  Constitutional: Negative for fever, chills, appetite change and unexpected weight change.  HENT: Negative for dental problem, drooling and facial swelling.   Respiratory: Negative for shortness of breath.   Cardiovascular: Negative for chest pain.  Neurological: Positive for numbness. Negative for dizziness, facial asymmetry, speech difficulty, weakness and headaches.  Hematological: Negative for adenopathy.       Objective:   Physical Exam  Constitutional: She is oriented to person, place, and time. She appears well-developed and well-nourished.  HENT:  Mouth/Throat: Oropharynx is clear and moist.  Eyes: Pupils are equal, round, and reactive to light.  Neck: Neck  supple. No thyromegaly present.  Cardiovascular: Normal rate.   Pulmonary/Chest: Breath sounds normal. No respiratory distress. She has no wheezes. She has no rales.  Neurological: She is alert and oriented to person, place, and time. No cranial nerve deficit. Coordination normal.  Cranial nerves II through XII are intact. She has some subjective  sensory impairment to touch involving the right lower lip region. No facial weakness.          Assessment & Plan:  #1 dysesthesias involving distribution of mental nerve right face as above. She has nonfocal exam other than some subjective sensory impairment. Recent MRI scan revealed no acute findings. We've recommended observation. She already has scheduled followup with neurology in 2 weeks and will discuss with them at that point if symptoms persist. #2 chronic hypokalemia. Recheck basic metabolic panel. Discussed potassium rich diet

## 2013-10-27 NOTE — Patient Instructions (Signed)
Follow up promptly for any facial weakness or progressive numbness.

## 2013-10-27 NOTE — Progress Notes (Signed)
Pre visit review using our clinic review tool, if applicable. No additional management support is needed unless otherwise documented below in the visit note. 

## 2013-11-02 ENCOUNTER — Telehealth: Payer: Self-pay

## 2013-11-02 NOTE — Telephone Encounter (Signed)
Pt informed that her lab results were normal.

## 2013-11-09 ENCOUNTER — Ambulatory Visit: Payer: 59 | Admitting: Nurse Practitioner

## 2014-01-23 ENCOUNTER — Encounter (HOSPITAL_COMMUNITY): Payer: Self-pay | Admitting: Emergency Medicine

## 2014-01-23 ENCOUNTER — Emergency Department (HOSPITAL_COMMUNITY)
Admission: EM | Admit: 2014-01-23 | Discharge: 2014-01-23 | Disposition: A | Discharge status: Home / Self Care | Payer: 59 | Attending: Emergency Medicine | Admitting: Emergency Medicine

## 2014-01-23 ENCOUNTER — Emergency Department (HOSPITAL_COMMUNITY): Payer: 59

## 2014-01-23 DIAGNOSIS — Z8659 Personal history of other mental and behavioral disorders: Secondary | ICD-10-CM | POA: Insufficient documentation

## 2014-01-23 DIAGNOSIS — R079 Chest pain, unspecified: Secondary | ICD-10-CM | POA: Insufficient documentation

## 2014-01-23 DIAGNOSIS — Z8742 Personal history of other diseases of the female genital tract: Secondary | ICD-10-CM | POA: Insufficient documentation

## 2014-01-23 DIAGNOSIS — R590 Localized enlarged lymph nodes: Secondary | ICD-10-CM

## 2014-01-23 DIAGNOSIS — Z8701 Personal history of pneumonia (recurrent): Secondary | ICD-10-CM | POA: Insufficient documentation

## 2014-01-23 DIAGNOSIS — Z8719 Personal history of other diseases of the digestive system: Secondary | ICD-10-CM | POA: Insufficient documentation

## 2014-01-23 DIAGNOSIS — Z79899 Other long term (current) drug therapy: Secondary | ICD-10-CM | POA: Insufficient documentation

## 2014-01-23 DIAGNOSIS — R599 Enlarged lymph nodes, unspecified: Secondary | ICD-10-CM | POA: Insufficient documentation

## 2014-01-23 DIAGNOSIS — Z8679 Personal history of other diseases of the circulatory system: Secondary | ICD-10-CM | POA: Insufficient documentation

## 2014-01-23 DIAGNOSIS — IMO0001 Reserved for inherently not codable concepts without codable children: Secondary | ICD-10-CM | POA: Insufficient documentation

## 2014-01-23 DIAGNOSIS — Z8639 Personal history of other endocrine, nutritional and metabolic disease: Secondary | ICD-10-CM | POA: Insufficient documentation

## 2014-01-23 DIAGNOSIS — R011 Cardiac murmur, unspecified: Secondary | ICD-10-CM | POA: Insufficient documentation

## 2014-01-23 DIAGNOSIS — Z3202 Encounter for pregnancy test, result negative: Secondary | ICD-10-CM | POA: Insufficient documentation

## 2014-01-23 DIAGNOSIS — Z862 Personal history of diseases of the blood and blood-forming organs and certain disorders involving the immune mechanism: Secondary | ICD-10-CM | POA: Insufficient documentation

## 2014-01-23 LAB — CBC
HCT: 35 % — ABNORMAL LOW (ref 36.0–46.0)
Hemoglobin: 11.9 g/dL — ABNORMAL LOW (ref 12.0–15.0)
MCH: 28.4 pg (ref 26.0–34.0)
MCHC: 34 g/dL (ref 30.0–36.0)
MCV: 83.5 fL (ref 78.0–100.0)
Platelets: 231 10*3/uL (ref 150–400)
RBC: 4.19 MIL/uL (ref 3.87–5.11)
RDW: 13.7 % (ref 11.5–15.5)
WBC: 6.8 10*3/uL (ref 4.0–10.5)

## 2014-01-23 LAB — BASIC METABOLIC PANEL
BUN: 11 mg/dL (ref 6–23)
CO2: 27 mEq/L (ref 19–32)
Calcium: 8.5 mg/dL (ref 8.4–10.5)
Chloride: 103 mEq/L (ref 96–112)
Creatinine, Ser: 0.76 mg/dL (ref 0.50–1.10)
GFR calc Af Amer: >90 mL/min (ref >90)
GFR calc non Af Amer: >90 mL/min (ref >90)
Glucose, Bld: 97 mg/dL (ref 70–99)
Potassium: 3.6 mEq/L — ABNORMAL LOW (ref 3.7–5.3)
Sodium: 140 mEq/L (ref 137–147)

## 2014-01-23 LAB — POC URINE PREG, ED: Preg Test, Ur: NEGATIVE

## 2014-01-23 LAB — I-STAT TROPONIN, ED: Troponin i, poc: 0 ng/mL (ref 0.00–0.08)

## 2014-01-23 MED ORDER — MORPHINE SULFATE 4 MG/ML IJ SOLN
2.0000 mg | Freq: Once | INTRAMUSCULAR | Status: AC
  Administered 2014-01-23: 2 mg via INTRAVENOUS
  Filled 2014-01-23: qty 1

## 2014-01-23 MED ORDER — IBUPROFEN 800 MG PO TABS: 800.0000 mg | ORAL_TABLET | Freq: Three times a day (TID) | ORAL | Status: DC

## 2014-01-23 MED ORDER — HYDROCODONE-ACETAMINOPHEN 5-325 MG PO TABS: 1.0000 | ORAL_TABLET | ORAL | Status: DC | PRN

## 2014-01-23 MED ORDER — PROMETHAZINE HCL 25 MG/ML IJ SOLN
25.0000 mg | Freq: Once | INTRAMUSCULAR | Status: AC
  Administered 2014-01-23: 25 mg via INTRAVENOUS
  Filled 2014-01-23: qty 1

## 2014-01-23 MED ORDER — KETOROLAC TROMETHAMINE 30 MG/ML IJ SOLN
30.0000 mg | Freq: Once | INTRAMUSCULAR | Status: AC
  Administered 2014-01-23: 30 mg via INTRAVENOUS
  Filled 2014-01-23: qty 1

## 2014-01-23 NOTE — ED Notes (Signed)
Pt.came in with complaint of right chest pain with right arm pain which started at 0600 am of yesterday 01/22/14.pt. Claimed of pain at 10/10/pt. Said she had this kind of pain before but was not as bad as today. Denies fall, denies SOB .

## 2014-01-23 NOTE — ED Notes (Signed)
EKG given to EDP,Otter,MD., for review. 

## 2014-01-23 NOTE — ED Provider Notes (Signed)
CSN: [id number]     Arrival date & time 01/23/14  0118 History   First MD Initiated Contact with Patient 01/23/14 [phone]     Chief Complaint  Patient presents with  . Chest Pain  . Arm Pain     (Consider location/radiation/quality/duration/timing/severity/associated sxs/prior Treatment) HPI Comments: Patient is a 33 year old female with a past medical history of Crohn's disease who presents with right axillary pain that started when she woke up this morning. She reports stretching and feeling a severe aching pain in her right axilla. The pain worsened throughout the day. Patient denies any injury. No radiation of the pain. No associated symptoms. Patient reports having this pain previously but has no been this bad or lasted this long.   Patient is a 33 y.o. female presenting with chest pain and arm pain.  Chest Pain Associated symptoms: no abdominal pain, no dizziness, no dysphagia, no fatigue, no fever, no nausea, no palpitations, no shortness of breath, not vomiting and no weakness   Arm Pain Associated symptoms include chest pain and myalgias. Pertinent negatives include no abdominal pain, arthralgias, chills, fatigue, fever, nausea, neck pain, vomiting or weakness.    Past Medical History  Diagnosis Date  . Crohn's disease   . PONV (postoperative nausea and vomiting)   . Pneumonia     history back in 2006  . Heart murmur     as child, never had any problems  . Anxiety   . Seasonal allergies   . Headache(784.0)     otc meds - last migraine 2009  . Low blood potassium     history  . Crohn's disease     history  . Anemia   . History of endometriosis   . Migraine without aura, with intractable migraine, so stated, without mention of status migrainosus 06/19/2013  . Vasovagal syncope    Past Surgical History  Procedure Laterality Date  . Tonsillectomy    . Adenoidectomy    . Dilatation & currettage/hysteroscopy with resectocope N/A 06/09/2013    Procedure: DILATATION &  CURETTAGE/HYSTEROSCOPY WITH HYSTEROSCOPIC RESECTION OF SUBMUCOSAL FIBROID AND ENDOMETRIAL POLYP;  Surgeon: Marvene Staff, MD;  Location: [facility];  Service: Gynecology;  Laterality: N/A;  . Laparoscopy N/A 06/09/2013    Procedure: LAPAROSCOPY DIAGNOSTIC;  Surgeon: Marvene Staff, MD;  Location: [facility];  Service: Gynecology;  Laterality: N/A;  . Robotic assisted laparoscopic lysis of adhesion N/A 06/09/2013    Procedure: ROBOTIC ASSISTED LAPAROSCOPIC LYSIS OF ADHESION; Resection of Endometriosis and excision of fibroid;  Surgeon: Marvene Staff, MD;  Location: [facility];  Service: Gynecology;  Laterality: N/A;   Family History  Problem Relation Age of Onset  . Heart disease Mother 70    CHF  . Crohn's disease Mother   . Asthma Brother    History  Substance Use Topics  . Smoking status: Never Smoker   . Smokeless tobacco: Never Used  . Alcohol Use: No   OB History   Grav Para Term Preterm Abortions TAB SAB Ect Mult Living                 Review of Systems  Constitutional: Negative for fever, chills and fatigue.  HENT: Negative for trouble swallowing.   Eyes: Negative for visual disturbance.  Respiratory: Negative for shortness of breath.   Cardiovascular: Positive for chest pain. Negative for palpitations.  Gastrointestinal: Negative for nausea, vomiting, abdominal pain and diarrhea.  Genitourinary: Negative for dysuria and difficulty urinating.  Musculoskeletal: Positive for  myalgias. Negative for arthralgias and neck pain.  Skin: Negative for color change.  Neurological: Negative for dizziness and weakness.  Psychiatric/Behavioral: Negative for dysphoric mood.      Allergies  Review of patient's allergies indicates no known allergies.  Home Medications   Prior to Admission medications   Medication Sig Start Date End Date Taking? Authorizing Provider  amphetamine-dextroamphetamine (ADDERALL) 30 MG tablet Take 30 mg by mouth daily. 01/04/14  Yes Historical  Provider, MD  FLUoxetine (PROZAC) 20 MG capsule Take 20 mg by mouth daily.   Yes Historical Provider, MD  ibuprofen (ADVIL,MOTRIN) 800 MG tablet Take 800 mg by mouth every 8 (eight) hours as needed (FOR PAIN.).  11/23/13  Yes Historical Provider, MD  leuprolide (LUPRON) 11.25 MG injection Inject 11.25 mg into the muscle every 3 (three) months.   Yes Historical Provider, MD  [facility] 1/35 tablet Take 1 tablet by mouth daily. 10/16/13  Yes Historical Provider, MD   BP 148/96  Pulse 82  Resp 16  SpO2 99% Physical Exam  Nursing note and vitals reviewed. Constitutional: She is oriented to person, place, and time. She appears well-developed and well-nourished. No distress.  HENT:  Head: Normocephalic and atraumatic.  Eyes: Conjunctivae and EOM are normal.  Neck: Normal range of motion.  Cardiovascular: Normal rate and regular rhythm.  Exam reveals no gallop and no friction rub.   No murmur heard. Pulmonary/Chest: Effort normal and breath sounds normal. She has no wheezes. She has no rales. She exhibits no tenderness.  Abdominal: Soft. She exhibits no distension. There is no tenderness. There is no rebound and no guarding.  Musculoskeletal: Normal range of motion.  Right axillary lymphadenopathy that is extremely tender to palpation.   Neurological: She is alert and oriented to person, place, and time. Coordination normal.  Speech is goal-oriented. Moves limbs without ataxia.   Skin: Skin is warm and dry.  Psychiatric: She has a normal mood and affect. Her behavior is normal.    ED Course  Procedures (including critical care time) Labs Review Labs Reviewed  CBC - Abnormal; Notable for the following:    Hemoglobin 11.9 (*)    HCT 35.0 (*)    All other components within normal limits  BASIC METABOLIC PANEL - Abnormal; Notable for the following:    Potassium 3.6 (*)    All other components within normal limits  I-STAT TROPOININ, ED  POC URINE PREG, ED    Imaging Review Dg Chest Port 1  View  01/23/2014   CLINICAL DATA:  Right anterior chest pain  EXAM: PORTABLE CHEST - 1 VIEW  COMPARISON:  CTA chest dated 05/04/2011  FINDINGS: Lungs are clear.  No pleural effusion or pneumothorax.  The heart is top-normal in size.  IMPRESSION: No evidence of acute cardiopulmonary disease.   Electronically Signed   By: Julian Hy M.D.   On: 01/23/2014 02:06     EKG Interpretation None      MDM   Final diagnoses:  Axillary lymphadenopathy    3:41 AM Labs and chest xray unremarkable for acute changes. Vitals stable and patient afebrile. Patient denies any breast mass or discharge. Patient will be discharged with pain medication and anti-inflammatory medication. Patient advised to return with worsening or concerning symptoms. Patient will follow up with PCP on Monday morning.    Alvina Chou, [place] 01/23/14 [phone]

## 2014-01-23 NOTE — ED Provider Notes (Signed)
Medical screening examination/treatment/procedure(s) were performed by non-physician practitioner and as supervising physician I was immediately available for consultation/collaboration.   EKG Interpretation None       Kalman Drape, MD 01/23/14 [phone]

## 2014-01-23 NOTE — Discharge Instructions (Signed)
Take Vicodin as needed for pain. Take Ibuprofen for inflammation. You may take these medications together. Refer to attached documents for more information. Follow up with your doctor. Return to the ED with worsening or concerning symptoms.

## 2014-03-09 ENCOUNTER — Ambulatory Visit: Payer: Self-pay | Admitting: Nurse Practitioner

## 2014-03-09 ENCOUNTER — Telehealth: Payer: Self-pay | Admitting: Neurology

## 2014-03-09 ENCOUNTER — Ambulatory Visit: Payer: Self-pay | Admitting: Neurology

## 2014-03-09 NOTE — Telephone Encounter (Signed)
This patient did not show for a revisit appointment today. 

## 2014-03-16 ENCOUNTER — Encounter: Payer: Self-pay | Admitting: Family Medicine

## 2014-03-16 ENCOUNTER — Ambulatory Visit (INDEPENDENT_AMBULATORY_CARE_PROVIDER_SITE_OTHER): Payer: 59 | Admitting: Family Medicine

## 2014-03-16 VITALS — BP 126/80 | HR 92 | Wt 172.0 lb

## 2014-03-16 DIAGNOSIS — H6530 Chronic mucoid otitis media, unspecified ear: Secondary | ICD-10-CM

## 2014-03-16 DIAGNOSIS — H6531 Chronic mucoid otitis media, right ear: Secondary | ICD-10-CM

## 2014-03-16 MED ORDER — FLUCONAZOLE 150 MG PO TABS: 150.0000 mg | ORAL_TABLET | Freq: Every day | ORAL | Status: DC

## 2014-03-16 MED ORDER — SCOPOLAMINE 1 MG/3DAYS TD PT72: 1.0000 | MEDICATED_PATCH | TRANSDERMAL | Status: DC

## 2014-03-16 MED ORDER — AMOXICILLIN 875 MG PO TABS: 875.0000 mg | ORAL_TABLET | Freq: Two times a day (BID) | ORAL | Status: DC

## 2014-03-16 MED ORDER — NEOMYCIN-POLYMYXIN-HC 1 % OT SOLN: 3.0000 [drp] | Freq: Four times a day (QID) | OTIC | Status: DC

## 2014-03-16 NOTE — Progress Notes (Signed)
Pre visit review using our clinic review tool, if applicable. No additional management support is needed unless otherwise documented below in the visit note. 

## 2014-03-16 NOTE — Progress Notes (Signed)
   Subjective:    Patient ID: Diane [name], female    DOB: 05-22-1981, 33 y.o.   MRN: [id number]  Otalgia  Pertinent negatives include no headaches or hearing loss.    Patient seen with right ear pain. She has history of tympanostomy tube in that ear which was placed over a year ago. Symptoms started a few days ago. Denies sinus congestion. No visible drainage. She took some ibuprofen which helped. No left ear pain. No cough. No fevers or chills. No hearing changes.  Upcoming cruise and patient requesting scopolamine patch for possible seasickness  Past Medical History  Diagnosis Date  . Crohn's disease   . PONV (postoperative nausea and vomiting)   . Pneumonia     history back in 2006  . Heart murmur     as child, never had any problems  . Anxiety   . Seasonal allergies   . Headache(784.0)     otc meds - last migraine 2009  . Low blood potassium     history  . Crohn's disease     history  . Anemia   . History of endometriosis   . Migraine without aura, with intractable migraine, so stated, without mention of status migrainosus 06/19/2013  . Vasovagal syncope    Past Surgical History  Procedure Laterality Date  . Tonsillectomy    . Adenoidectomy    . Dilatation & currettage/hysteroscopy with resectocope N/A 06/09/2013    Procedure: DILATATION & CURETTAGE/HYSTEROSCOPY WITH HYSTEROSCOPIC RESECTION OF SUBMUCOSAL FIBROID AND ENDOMETRIAL POLYP;  Surgeon: Marvene Staff, MD;  Location: [facility];  Service: Gynecology;  Laterality: N/A;  . Laparoscopy N/A 06/09/2013    Procedure: LAPAROSCOPY DIAGNOSTIC;  Surgeon: Marvene Staff, MD;  Location: [facility];  Service: Gynecology;  Laterality: N/A;  . Robotic assisted laparoscopic lysis of adhesion N/A 06/09/2013    Procedure: ROBOTIC ASSISTED LAPAROSCOPIC LYSIS OF ADHESION; Resection of Endometriosis and excision of fibroid;  Surgeon: Marvene Staff, MD;  Location: [facility];  Service: Gynecology;  Laterality: N/A;    reports  that she has never smoked. She has never used smokeless tobacco. She reports that she does not drink alcohol or use illicit drugs. family history includes Asthma in her brother; Crohn's disease in her mother; Heart disease (age of onset: 75) in her mother. No Known Allergies    Review of Systems  Constitutional: Negative for fever and chills.  HENT: Positive for ear pain. Negative for congestion, hearing loss and sinus pressure.   Neurological: Negative for headaches.       Objective:   Physical Exam  Constitutional: She appears well-developed and well-nourished.  HENT:  Left Ear: External ear normal.  Mouth/Throat: Oropharynx is clear and moist.  Tympanostomy tube in place right ear. Around base of tube she has some yellowish to brownish fluid which may represent some purulent secretions. Slightly brownish in color which may represent some wax as well.  Neck: Neck supple. No thyromegaly present.  Cardiovascular: Normal rate.   Pulmonary/Chest: Effort normal and breath sounds normal. No respiratory distress. She has no wheezes. She has no rales.          Assessment & Plan:  Chronic right ear effusion with likely some recent purulent drainage. Start Cortisporin otic drops. Keep ear dry. Amoxicillin 875 mg twice a day for 10 days. Patient requesting fluconazole prescription. She tends to have recurrent yeast vaginitis with antibiotics. Also prescription for scopolamine for prevention of seasickness

## 2014-03-16 NOTE — Patient Instructions (Signed)
Keep ear dry and avoid swimming.

## 2014-03-25 ENCOUNTER — Emergency Department (HOSPITAL_COMMUNITY): Payer: 59

## 2014-03-25 ENCOUNTER — Encounter (HOSPITAL_COMMUNITY): Payer: Self-pay | Admitting: Emergency Medicine

## 2014-03-25 ENCOUNTER — Emergency Department (HOSPITAL_COMMUNITY)
Admission: EM | Admit: 2014-03-25 | Discharge: 2014-03-25 | Disposition: A | Discharge status: Home / Self Care | Payer: 59 | Attending: Emergency Medicine | Admitting: Emergency Medicine

## 2014-03-25 DIAGNOSIS — Z862 Personal history of diseases of the blood and blood-forming organs and certain disorders involving the immune mechanism: Secondary | ICD-10-CM | POA: Insufficient documentation

## 2014-03-25 DIAGNOSIS — M25473 Effusion, unspecified ankle: Secondary | ICD-10-CM | POA: Insufficient documentation

## 2014-03-25 DIAGNOSIS — Z8679 Personal history of other diseases of the circulatory system: Secondary | ICD-10-CM | POA: Insufficient documentation

## 2014-03-25 DIAGNOSIS — Z8742 Personal history of other diseases of the female genital tract: Secondary | ICD-10-CM | POA: Insufficient documentation

## 2014-03-25 DIAGNOSIS — Z792 Long term (current) use of antibiotics: Secondary | ICD-10-CM | POA: Insufficient documentation

## 2014-03-25 DIAGNOSIS — M25471 Effusion, right ankle: Secondary | ICD-10-CM

## 2014-03-25 DIAGNOSIS — Z79899 Other long term (current) drug therapy: Secondary | ICD-10-CM | POA: Insufficient documentation

## 2014-03-25 DIAGNOSIS — F411 Generalized anxiety disorder: Secondary | ICD-10-CM | POA: Insufficient documentation

## 2014-03-25 DIAGNOSIS — R011 Cardiac murmur, unspecified: Secondary | ICD-10-CM | POA: Insufficient documentation

## 2014-03-25 DIAGNOSIS — M25476 Effusion, unspecified foot: Principal | ICD-10-CM | POA: Insufficient documentation

## 2014-03-25 DIAGNOSIS — Z8701 Personal history of pneumonia (recurrent): Secondary | ICD-10-CM | POA: Insufficient documentation

## 2014-03-25 DIAGNOSIS — E876 Hypokalemia: Secondary | ICD-10-CM | POA: Insufficient documentation

## 2014-03-25 LAB — CBC WITH DIFFERENTIAL/PLATELET
Basophils Absolute: 0 10*3/uL (ref 0.0–0.1)
Basophils Relative: 0 % (ref 0–1)
Eosinophils Absolute: 0.1 10*3/uL (ref 0.0–0.7)
Eosinophils Relative: 1 % (ref 0–5)
HCT: 34.8 % — ABNORMAL LOW (ref 36.0–46.0)
Hemoglobin: 12 g/dL (ref 12.0–15.0)
Lymphocytes Relative: 24 % (ref 12–46)
Lymphs Abs: 1.5 10*3/uL (ref 0.7–4.0)
MCH: 28.8 pg (ref 26.0–34.0)
MCHC: 34.5 g/dL (ref 30.0–36.0)
MCV: 83.7 fL (ref 78.0–100.0)
Monocytes Absolute: 0.4 10*3/uL (ref 0.1–1.0)
Monocytes Relative: 6 % (ref 3–12)
Neutro Abs: 4.3 10*3/uL (ref 1.7–7.7)
Neutrophils Relative %: 69 % (ref 43–77)
Platelets: 283 10*3/uL (ref 150–400)
RBC: 4.16 MIL/uL (ref 3.87–5.11)
RDW: 13 % (ref 11.5–15.5)
WBC: 6.2 10*3/uL (ref 4.0–10.5)

## 2014-03-25 LAB — I-STAT CHEM 8, ED
BUN: 7 mg/dL (ref 6–23)
Calcium, Ion: 1.12 mmol/L (ref 1.12–1.23)
Chloride: 100 mEq/L (ref 96–112)
Creatinine, Ser: 0.8 mg/dL (ref 0.50–1.10)
Glucose, Bld: 95 mg/dL (ref 70–99)
HCT: 37 % (ref 36.0–46.0)
Hemoglobin: 12.6 g/dL (ref 12.0–15.0)
Potassium: 3.4 mEq/L — ABNORMAL LOW (ref 3.7–5.3)
Sodium: 141 mEq/L (ref 137–147)
TCO2: 25 mmol/L (ref 0–100)

## 2014-03-25 MED ORDER — POTASSIUM CHLORIDE CRYS ER 20 MEQ PO TBCR
40.0000 meq | EXTENDED_RELEASE_TABLET | Freq: Once | ORAL | Status: DC
Start: 2014-03-25 — End: 2014-03-26

## 2014-03-25 MED ORDER — POTASSIUM CHLORIDE 20 MEQ/15ML (10%) PO LIQD: 40.0000 meq | Freq: Every day | ORAL | Status: DC

## 2014-03-25 MED ORDER — IBUPROFEN 200 MG PO TABS
600.0000 mg | ORAL_TABLET | Freq: Once | ORAL | Status: DC
  Filled 2014-03-25: qty 3

## 2014-03-25 NOTE — ED Notes (Signed)
Patient states that she began to notice swelling in her right ankle on Monday. She denies any trauma or injury to the area. It is tender to touch and she has pain with range of motion. No history of joint pain or injury.

## 2014-03-25 NOTE — Discharge Instructions (Signed)
Hypokalemia Hypokalemia means that the amount of potassium in the blood is lower than normal.Potassium is a chemical, called an electrolyte, that helps regulate the amount of fluid in the body. It also stimulates muscle contraction and helps nerves function properly.Most of the body's potassium is inside of cells, and only a very small amount is in the blood. Because the amount in the blood is so small, minor changes can be life-threatening. CAUSES  Antibiotics.  Diarrhea or vomiting.  Using laxatives too much, which can cause diarrhea.  Chronic kidney disease.  Water pills (diuretics).  Eating disorders (bulimia).  Low magnesium level.  Sweating a lot. SIGNS AND SYMPTOMS  Weakness.  Constipation.  Fatigue.  Muscle cramps.  Mental confusion.  Skipped heartbeats or irregular heartbeat (palpitations).  Tingling or numbness. DIAGNOSIS  Your health care provider can diagnose hypokalemia with blood tests. In addition to checking your potassium level, your health care provider may also check other lab tests. TREATMENT Hypokalemia can be treated with potassium supplements taken by mouth or adjustments in your current medicines. If your potassium level is very low, you may need to get potassium through a vein (IV) and be monitored in the hospital. A diet high in potassium is also helpful. Foods high in potassium are:  Nuts, such as peanuts and pistachios.  Seeds, such as sunflower seeds and pumpkin seeds.  Peas, lentils, and lima beans.  Whole grain and bran cereals and breads.  Fresh fruit and vegetables, such as apricots, avocado, bananas, cantaloupe, kiwi, oranges, tomatoes, asparagus, and potatoes.  Orange and tomato juices.  Red meats.  Fruit yogurt. HOME CARE INSTRUCTIONS  Take all medicines as prescribed by your health care provider.  Maintain a healthy diet by including nutritious food, such as fruits, vegetables, nuts, whole grains, and lean meats.  If  you are taking a laxative, be sure to follow the directions on the label. SEEK MEDICAL CARE IF:  Your weakness gets worse.  You feel your heart pounding or racing.  You are vomiting or having diarrhea.  You are diabetic and having trouble keeping your blood glucose in the normal range. SEEK IMMEDIATE MEDICAL CARE IF:  You have chest pain, shortness of breath, or dizziness.  You are vomiting or having diarrhea for more than 2 days.  You faint. MAKE SURE YOU:   Understand these instructions.  Will watch your condition.  Will get help right away if you are not doing well or get worse. Document Released: 09/10/2005 Document Revised: 07/01/2013 Document Reviewed: 03/13/2013 [facility] Patient Information 2015 [place], [place]. This information is not intended to replace advice given to you by your health care provider. Make sure you discuss any questions you have with your health care provider. No specific cause for your ankle joint swelling has been identified tonight Please take Ibuprofen on a regular basis for the next several days as well as wearing the ACE Bandage for support  You potassium was slightly low at 3.4  Please try to eat more potassium rich foods  Potassium Content of Foods Potassium is a mineral found in many foods and drinks. It helps keep fluids and minerals balanced in your body and affects how steadily your heart beats. Potassium also helps control your blood pressure and keep your muscles and nervous system healthy. Certain health conditions and medicines may change the balance of potassium in your body. When this happens, you can help balance your level of potassium through the foods that you do or do not eat. Your health  care provider or dietitian may recommend an amount of potassium that you should have each day. The following lists of foods provide the amount of potassium (in parentheses) per serving in each item. HIGH IN POTASSIUM  The following foods and beverages  have 200 mg or more of potassium per serving:  Apricots, 2 raw or 5 dry (200 mg).  Artichoke, 1 medium (345 mg).  Avocado, raw,  each (245 mg).  Banana, 1 medium (425 mg).  Beans, lima, or baked beans, canned,  cup (280 mg).  Beans, white, canned,  cup (595 mg).  Beef roast, 3 oz (320 mg).  Beef, ground, 3 oz (270 mg).  Beets, raw or cooked,  cup (260 mg).  Bran muffin, 2 oz (300 mg).  Broccoli,  cup (230 mg).  Brussels sprouts,  cup (250 mg).  Cantaloupe,  cup (215 mg).  Cereal, 100% bran,  cup (200-400 mg).  Cheeseburger, single, fast food, 1 each (225-400 mg).  Chicken, 3 oz (220 mg).  Clams, canned, 3 oz (535 mg).  Crab, 3 oz (225 mg).  Dates, 5 each (270 mg).  Dried beans and peas,  cup (300-475 mg).  Figs, dried, 2 each (260 mg).  Fish: halibut, tuna, cod, snapper, 3 oz (480 mg).  Fish: salmon, haddock, swordfish, perch, 3 oz (300 mg).  Fish, tuna, canned 3 oz (200 mg).  [place] fries, fast food, 3 oz (470 mg).  Granola with fruit and nuts,  cup (200 mg).  Grapefruit juice,  cup (200 mg).  Greens, beet,  cup (655 mg).  Honeydew melon,  cup (200 mg).  Kale, raw, 1 cup (300 mg).  Kiwi, 1 medium (240 mg).  Kohlrabi, rutabaga, parsnips,  cup (280 mg).  Lentils,  cup (365 mg).  Mango, 1 each (325 mg).  Milk, chocolate, 1 cup (420 mg).  Milk: nonfat, low-fat, whole, buttermilk, 1 cup (350-380 mg).  Molasses, 1 Tbsp (295 mg).  Mushrooms,  cup (280) mg.  Nectarine, 1 each (275 mg).  Nuts: almonds, peanuts, hazelnuts, [place], cashew, mixed, 1 oz (200 mg).  Nuts, pistachios, 1 oz (295 mg).  Orange, 1 each (240 mg).  Orange juice,  cup (235 mg).  Papaya, medium,  fruit (390 mg).  Peanut butter, chunky, 2 Tbsp (240 mg).  Peanut butter, smooth, 2 Tbsp (210 mg).  Pear, 1 medium (200 mg).  Pomegranate, 1 whole (400 mg).  Pomegranate juice,  cup (215 mg).  Pork, 3 oz (350 mg).  Potato chips, salted, 1 oz  (465 mg).  Potato, baked with skin, 1 medium (925 mg).  Potatoes, boiled,  cup (255 mg).  Potatoes, mashed,  cup (330 mg).  Prune juice,  cup (370 mg).  Prunes, 5 each (305 mg).  Pudding, chocolate,  cup (230 mg).  Pumpkin, canned,  cup (250 mg).  Raisins, seedless,  cup (270 mg).  Seeds, sunflower or pumpkin, 1 oz (240 mg).  Soy milk, 1 cup (300 mg).  Spinach,  cup (420 mg).  Spinach, canned,  cup (370 mg).  Sweet potato, baked with skin, 1 medium (450 mg).  Swiss chard,  cup (480 mg).  Tomato or vegetable juice,  cup (275 mg).  Tomato sauce or puree,  cup (400-550 mg).  Tomato, raw, 1 medium (290 mg).  Tomatoes, canned,  cup (200-300 mg).  [place], 3 oz (250 mg).  Wheat germ, 1 oz (250 mg).  Winter squash,  cup (250 mg).  Yogurt, plain or fruited, 6 oz (260-435 mg).  Zucchini,  cup (220  mg). MODERATE IN POTASSIUM The following foods and beverages have 50-200 mg of potassium per serving:  Apple, 1 each (150 mg).  Apple juice,  cup (150 mg).  Applesauce,  cup (90 mg).  Apricot nectar,  cup (140 mg).  Asparagus, small spears,  cup or 6 spears (155 mg).  Bagel, cinnamon raisin, 1 each (130 mg).  Bagel, egg or plain, 4 in., 1 each (70 mg).  Beans, green,  cup (90 mg).  Beans, yellow,  cup (190 mg).  Beer, regular, 12 oz (100 mg).  Beets, canned,  cup (125 mg).  Blackberries,  cup (115 mg).  Blueberries,  cup (60 mg).  Bread, whole wheat, 1 slice (70 mg).  Broccoli, raw,  cup (145 mg).  Cabbage,  cup (150 mg).  Carrots, cooked or raw,  cup (180 mg).  Cauliflower, raw,  cup (150 mg).  Celery, raw,  cup (155 mg).  Cereal, bran flakes, cup (120-150 mg).  Cheese, cottage,  cup (110 mg).  Cherries, 10 each (150 mg).  Chocolate, 1 oz bar (165 mg).  Coffee, brewed 6 oz (90 mg).  Corn,  cup or 1 ear (195 mg).  Cucumbers,  cup (80 mg).  Egg, large, 1 each (60 mg).  Eggplant,  cup (60  mg).  Endive, raw, cup (80 mg).  English muffin, 1 each (65 mg).  Fish, orange roughy, 3 oz (150 mg).  Frankfurter, beef or pork, 1 each (75 mg).  Fruit cocktail,  cup (115 mg).  Grape juice,  cup (170 mg).  Grapefruit,  fruit (175 mg).  Grapes,  cup (155 mg).  Greens: kale, turnip, collard,  cup (110-150 mg).  Ice cream or frozen yogurt, chocolate,  cup (175 mg).  Ice cream or frozen yogurt, vanilla,  cup (120-150 mg).  Lemons, limes, 1 each (80 mg).  Lettuce, all types, 1 cup (100 mg).  Mixed vegetables,  cup (150 mg).  Mushrooms, raw,  cup (110 mg).  Nuts: walnuts, pecans, or macadamia, 1 oz (125 mg).  Oatmeal,  cup (80 mg).  Okra,  cup (110 mg).  Onions, raw,  cup (120 mg).  Peach, 1 each (185 mg).  Peaches, canned,  cup (120 mg).  Pears, canned,  cup (120 mg).  Peas, green, frozen,  cup (90 mg).  Peppers, green,  cup (130 mg).  Peppers, red,  cup (160 mg).  Pineapple juice,  cup (165 mg).  Pineapple, fresh or canned,  cup (100 mg).  Plums, 1 each (105 mg).  Pudding, vanilla,  cup (150 mg).  Raspberries,  cup (90 mg).  Rhubarb,  cup (115 mg).  Rice, wild,  cup (80 mg).  Shrimp, 3 oz (155 mg).  Spinach, raw, 1 cup (170 mg).  Strawberries,  cup (125 mg).  Summer squash  cup (175-200 mg).  Swiss chard, raw, 1 cup (135 mg).  Tangerines, 1 each (140 mg).  Tea, brewed, 6 oz (65 mg).  Turnips,  cup (140 mg).  Watermelon,  cup (85 mg).  Wine, red, table, 5 oz (180 mg).  Wine, white, table, 5 oz (100 mg). LOW IN POTASSIUM The following foods and beverages have less than 50 mg of potassium per serving.  Bread, white, 1 slice (30 mg).  Carbonated beverages, 12 oz (less than 5 mg).  Cheese, 1 oz (20-30 mg).  Cranberries,  cup (45 mg).  Cranberry juice cocktail,  cup (20 mg).  Fats and oils, 1 Tbsp (less than 5 mg).  Hummus, 1 Tbsp (32 mg).  Nectar:  papaya, mango, or pear,  cup (35  mg).  Rice, white or brown,  cup (50 mg).  Spaghetti or macaroni,  cup cooked (30 mg).  Tortilla, flour or corn, 1 each (50 mg).  Waffle, 4 in., 1 each (50 mg).  Water chestnuts,  cup (40 mg). Document Released: 04/24/2005 Document Revised: 09/15/2013 Document Reviewed: 08/07/2013 [facility] Patient Information 2015 [place], [place]. This information is not intended to replace advice given to you by your health care provider. Make sure you discuss any questions you have with your health care provider.

## 2014-03-25 NOTE — ED Provider Notes (Signed)
CSN: [id number]     Arrival date & time 03/25/14  2049 History   None    This chart was scribed for non-physician practitioner, Junius Creamer, [place] working with Jasper Riling. Alvino Chapel, MD by Forrestine Him, ED Scribe. This patient was seen in room WTR8/WTR8 and the patient's care was started at 9:16 PM.   Chief Complaint  Patient presents with  . Joint Swelling   The history is provided by the patient. No language interpreter was used.    HPI Comments: Diane [name] is a 33 y.o. female who presents to the Emergency Department complaining of ongoing joint swelling to R ankle x 3 days that has progressively worsened. No new trauma or injury. The pain is exacerbated with  Palpation and ROM.  She has not tried anything OTC for symptoms. She denies contacting her PCP regarding her pain. No numbness, loss of sensation, or paresthesia. No history of Gout or Lupus. She has no other concerns this visit.  Past Medical History  Diagnosis Date  . Crohn's disease   . PONV (postoperative nausea and vomiting)   . Pneumonia     history back in 2006  . Heart murmur     as child, never had any problems  . Anxiety   . Seasonal allergies   . Headache(784.0)     otc meds - last migraine 2009  . Low blood potassium     history  . Crohn's disease     history  . Anemia   . History of endometriosis   . Migraine without aura, with intractable migraine, so stated, without mention of status migrainosus 06/19/2013  . Vasovagal syncope    Past Surgical History  Procedure Laterality Date  . Tonsillectomy    . Adenoidectomy    . Dilatation & currettage/hysteroscopy with resectocope N/A 06/09/2013    Procedure: DILATATION & CURETTAGE/HYSTEROSCOPY WITH HYSTEROSCOPIC RESECTION OF SUBMUCOSAL FIBROID AND ENDOMETRIAL POLYP;  Surgeon: Marvene Staff, MD;  Location: [facility];  Service: Gynecology;  Laterality: N/A;  . Laparoscopy N/A 06/09/2013    Procedure: LAPAROSCOPY DIAGNOSTIC;  Surgeon: Marvene Staff, MD;   Location: [facility];  Service: Gynecology;  Laterality: N/A;  . Robotic assisted laparoscopic lysis of adhesion N/A 06/09/2013    Procedure: ROBOTIC ASSISTED LAPAROSCOPIC LYSIS OF ADHESION; Resection of Endometriosis and excision of fibroid;  Surgeon: Marvene Staff, MD;  Location: [facility];  Service: Gynecology;  Laterality: N/A;   Family History  Problem Relation Age of Onset  . Heart disease Mother 54    CHF  . Crohn's disease Mother   . Asthma Brother    History  Substance Use Topics  . Smoking status: Never Smoker   . Smokeless tobacco: Never Used  . Alcohol Use: No   OB History   Grav Para Term Preterm Abortions TAB SAB Ect Mult Living                 Review of Systems  Constitutional: Negative for fever and chills.  Musculoskeletal: Positive for arthralgias (R ankle) and joint swelling (R ankle). Negative for myalgias.  Skin: Negative for rash and wound.  Neurological: Negative for weakness and numbness.  All other systems reviewed and are negative.     Allergies  Review of patient's allergies indicates no known allergies.  Home Medications   Prior to Admission medications   Medication Sig Start Date End Date Taking? Authorizing Provider  amoxicillin (AMOXIL) 875 MG tablet Take 1 tablet (875 mg total) by mouth 2 (  two) times daily. 03/16/14   Eulas Post, MD  amphetamine-dextroamphetamine (ADDERALL) 30 MG tablet Take 30 mg by mouth daily. 01/04/14   Historical Provider, MD  fluconazole (DIFLUCAN) 150 MG tablet Take 1 tablet (150 mg total) by mouth daily. 03/16/14   Eulas Post, MD  FLUoxetine (PROZAC) 20 MG capsule Take 20 mg by mouth daily.    Historical Provider, MD  ibuprofen (ADVIL,MOTRIN) 800 MG tablet Take 800 mg by mouth every 8 (eight) hours as needed (FOR PAIN.).  11/23/13   Historical Provider, MD  ibuprofen (ADVIL,MOTRIN) 800 MG tablet Take 1 tablet (800 mg total) by mouth 3 (three) times daily. 01/23/14   Kaitlyn Szekalski, PA-C   NEOMYCIN-POLYMYXIN-HYDROCORTISONE (CORTISPORIN) 1 % SOLN otic solution Place 3 drops into the right ear 4 (four) times daily. 03/16/14   Eulas Post, MD  [facility] 1/35 tablet Take 1 tablet by mouth daily. 10/16/13   Historical Provider, MD  scopolamine (TRANSDERM-SCOP) 1 MG/3DAYS Place 1 patch (1.5 mg total) onto the skin every 3 (three) days. 03/16/14   Eulas Post, MD   Triage Vitals: BP 150/95  Pulse 81  Temp(Src) 98.9 F (37.2 C)  SpO2 92%  LMP 07/26/2013   Physical Exam  Nursing note and vitals reviewed. Constitutional: She is oriented to person, place, and time. She appears well-developed and well-nourished.  HENT:  Head: Normocephalic.  Eyes: EOM are normal.  Neck: Normal range of motion.  Cardiovascular: Normal rate and regular rhythm.   Pulmonary/Chest: Effort normal.  Musculoskeletal: Normal range of motion. She exhibits edema and tenderness.       Right ankle: She exhibits swelling. She exhibits normal range of motion, no ecchymosis and no deformity. Tenderness. Achilles tendon exhibits no pain.       Feet:  Neurological: She is alert and oriented to person, place, and time.  Psychiatric: She has a normal mood and affect.    ED Course  Procedures (including critical care time)  DIAGNOSTIC STUDIES: Oxygen Saturation is 92% on RA, adequate by my interpretation.    COORDINATION OF CARE: 9:17 PM-Discussed treatment plan with pt at bedside and pt agreed to plan.     Labs Review Labs Reviewed  CBC WITH DIFFERENTIAL - Abnormal; Notable for the following:    HCT 34.8 (*)    All other components within normal limits  I-STAT CHEM 8, ED - Abnormal; Notable for the following:    Potassium 3.4 (*)    All other components within normal limits    Imaging Review Dg Ankle Complete Right  03/25/2014   CLINICAL DATA:  Ankle pain and swelling  EXAM: RIGHT ANKLE - COMPLETE 3+ VIEW  COMPARISON:  04/09/2013  FINDINGS: Right ankle demonstrates no fracture or dislocation.  The ankle mortise is intact. There is mild soft tissue swelling over the lateral malleolus.  IMPRESSION: No acute osseous injury of the right ankle.   Electronically Signed   By: Kathreen Devoid   On: 03/25/2014 21:52     EKG Interpretation None      MDM  Patient with  chronic hypokalemia now with K+ 3.4   Refused tablet will only take as IV dose CBC shows no sign of infection and xray is normal   Will ACE wrap the ankle and have patient FU with PCP Final diagnoses:  Swelling of ankle joint, right  Hypokalemia       I personally performed the services described in this documentation, which was scribed in my presence. The recorded information  has been reviewed and is accurate.    Garald Balding, NP 03/25/14 2223

## 2014-03-26 NOTE — ED Provider Notes (Signed)
Medical screening examination/treatment/procedure(s) were performed by non-physician practitioner and as supervising physician I was immediately available for consultation/collaboration.   EKG Interpretation None       Jasper Riling. Alvino Chapel, MD 03/26/14 [phone]

## 2014-03-29 ENCOUNTER — Telehealth: Payer: Self-pay | Admitting: Family Medicine

## 2014-03-29 NOTE — Telephone Encounter (Signed)
Pt would like to speak w/ you about getting a referrral to an ortho md. Pt went to ed over the weekend/ w/ no dx. Pt would like a cb  Pt has knot on her foot.

## 2014-03-29 NOTE — Telephone Encounter (Signed)
Ed notes are in pt chart

## 2014-03-30 NOTE — Telephone Encounter (Signed)
I would offer her follow up with Dr Gardenia Phlegm to assess. I would explain he is sports medicine trained and she would not need to see surgeon unless surgical problem discovered.

## 2014-03-31 ENCOUNTER — Other Ambulatory Visit: Payer: Self-pay | Admitting: Family Medicine

## 2014-03-31 DIAGNOSIS — M2540 Effusion, unspecified joint: Secondary | ICD-10-CM

## 2014-03-31 NOTE — Telephone Encounter (Signed)
Left message for patient to return call.

## 2014-03-31 NOTE — Telephone Encounter (Signed)
Patient is okay with the referral to Dr. Gardenia Phlegm.

## 2014-04-02 ENCOUNTER — Emergency Department (HOSPITAL_BASED_OUTPATIENT_CLINIC_OR_DEPARTMENT_OTHER)
Admission: EM | Admit: 2014-04-02 | Discharge: 2014-04-02 | Disposition: A | Discharge status: Home / Self Care | Payer: 59 | Attending: Emergency Medicine | Admitting: Emergency Medicine

## 2014-04-02 ENCOUNTER — Encounter (HOSPITAL_BASED_OUTPATIENT_CLINIC_OR_DEPARTMENT_OTHER): Payer: Self-pay | Admitting: Emergency Medicine

## 2014-04-02 DIAGNOSIS — Z8742 Personal history of other diseases of the female genital tract: Secondary | ICD-10-CM | POA: Insufficient documentation

## 2014-04-02 DIAGNOSIS — IMO0002 Reserved for concepts with insufficient information to code with codable children: Secondary | ICD-10-CM | POA: Insufficient documentation

## 2014-04-02 DIAGNOSIS — Z8679 Personal history of other diseases of the circulatory system: Secondary | ICD-10-CM | POA: Insufficient documentation

## 2014-04-02 DIAGNOSIS — Z792 Long term (current) use of antibiotics: Secondary | ICD-10-CM | POA: Insufficient documentation

## 2014-04-02 DIAGNOSIS — M25571 Pain in right ankle and joints of right foot: Secondary | ICD-10-CM

## 2014-04-02 DIAGNOSIS — Z862 Personal history of diseases of the blood and blood-forming organs and certain disorders involving the immune mechanism: Secondary | ICD-10-CM | POA: Insufficient documentation

## 2014-04-02 DIAGNOSIS — R011 Cardiac murmur, unspecified: Secondary | ICD-10-CM | POA: Insufficient documentation

## 2014-04-02 DIAGNOSIS — Z8719 Personal history of other diseases of the digestive system: Secondary | ICD-10-CM | POA: Insufficient documentation

## 2014-04-02 DIAGNOSIS — M25579 Pain in unspecified ankle and joints of unspecified foot: Secondary | ICD-10-CM | POA: Insufficient documentation

## 2014-04-02 DIAGNOSIS — Z8701 Personal history of pneumonia (recurrent): Secondary | ICD-10-CM | POA: Insufficient documentation

## 2014-04-02 DIAGNOSIS — Z8639 Personal history of other endocrine, nutritional and metabolic disease: Secondary | ICD-10-CM | POA: Insufficient documentation

## 2014-04-02 MED ORDER — HYDROCODONE-ACETAMINOPHEN 5-325 MG PO TABS: 1.0000 | ORAL_TABLET | Freq: Four times a day (QID) | ORAL | Status: DC | PRN

## 2014-04-02 MED ORDER — HYDROCODONE-ACETAMINOPHEN 5-325 MG PO TABS
1.0000 | ORAL_TABLET | Freq: Once | ORAL | Status: AC
  Administered 2014-04-02: 1 via ORAL
  Filled 2014-04-02: qty 1

## 2014-04-02 NOTE — ED Notes (Signed)
Pt c/o rt foot pain, denies injury, states seen on 7/2 at [facility], referred to ortho

## 2014-04-02 NOTE — ED Provider Notes (Signed)
CSN: [id number]     Arrival date & time 04/02/14  2222 History  This chart was scribed for Wynetta Fines, MD by Cathie Hoops, ED Scribe. The patient was seen in MH04/MH04. The patient's care was started at 11:29 PM.   Chief Complaint  Patient presents with  . Foot Pain    HPI HPI Comments: Diane [name] is a 33 y.o. female who presents to the Emergency Department complaining of new, constant, gradually worsening right foot pain onset 8 days ago. Pt states she went on vacation and noticed a knot after she returned home. Pt denies any injury to her right foot. Pt states she intially had no pain but it was tender to touch. Pt went to [facility] for injury and was told to take anti-inflammatory medication, received an x-ray but no reported fractures. Pt states she has taken Motrin and swelling goes down but a knot is still visibly present. Pt was referred to orthopedist and has an appointment for 7/27. Pt denies pain and swelling in calf.   Past Medical History  Diagnosis Date  . Crohn's disease   . PONV (postoperative nausea and vomiting)   . Pneumonia     history back in 2006  . Heart murmur     as child, never had any problems  . Anxiety   . Seasonal allergies   . Headache(784.0)     otc meds - last migraine 2009  . Low blood potassium     history  . Crohn's disease     history  . Anemia   . History of endometriosis   . Migraine without aura, with intractable migraine, so stated, without mention of status migrainosus 06/19/2013  . Vasovagal syncope    Past Surgical History  Procedure Laterality Date  . Tonsillectomy    . Adenoidectomy    . Dilatation & currettage/hysteroscopy with resectocope N/A 06/09/2013    Procedure: DILATATION & CURETTAGE/HYSTEROSCOPY WITH HYSTEROSCOPIC RESECTION OF SUBMUCOSAL FIBROID AND ENDOMETRIAL POLYP;  Surgeon: Marvene Staff, MD;  Location: [facility];  Service: Gynecology;  Laterality: N/A;  . Laparoscopy N/A 06/09/2013    Procedure: LAPAROSCOPY  DIAGNOSTIC;  Surgeon: Marvene Staff, MD;  Location: [facility];  Service: Gynecology;  Laterality: N/A;  . Robotic assisted laparoscopic lysis of adhesion N/A 06/09/2013    Procedure: ROBOTIC ASSISTED LAPAROSCOPIC LYSIS OF ADHESION; Resection of Endometriosis and excision of fibroid;  Surgeon: Marvene Staff, MD;  Location: [facility];  Service: Gynecology;  Laterality: N/A;   Family History  Problem Relation Age of Onset  . Heart disease Mother 21    CHF  . Crohn's disease Mother   . Asthma Brother    History  Substance Use Topics  . Smoking status: Never Smoker   . Smokeless tobacco: Never Used  . Alcohol Use: No   OB History   Grav Para Term Preterm Abortions TAB SAB Ect Mult Living                 Review of Systems A complete 10 system review of systems was obtained and all systems are negative except as noted in the HPI and PMH.   Allergies  Review of patient's allergies indicates no known allergies.  Home Medications   Prior to Admission medications   Medication Sig Start Date End Date Taking? Authorizing Provider  amoxicillin (AMOXIL) 875 MG tablet Take 1 tablet (875 mg total) by mouth 2 (two) times daily. 03/16/14   Eulas Post, MD  amphetamine-dextroamphetamine (  ADDERALL) 30 MG tablet Take 30 mg by mouth daily. 01/04/14   Historical Provider, MD  fluconazole (DIFLUCAN) 150 MG tablet Take 1 tablet (150 mg total) by mouth daily. 03/16/14   Eulas Post, MD  FLUoxetine (PROZAC) 20 MG capsule Take 20 mg by mouth daily.    Historical Provider, MD  HYDROcodone-acetaminophen (NORCO/VICODIN) 5-325 MG per tablet Take 1-2 tablets by mouth every 6 (six) hours as needed. 04/02/14   Karen Chafe Keyundra Fant, MD  ibuprofen (ADVIL,MOTRIN) 800 MG tablet Take 800 mg by mouth every 8 (eight) hours as needed (FOR PAIN.).  11/23/13   Historical Provider, MD  ibuprofen (ADVIL,MOTRIN) 800 MG tablet Take 1 tablet (800 mg total) by mouth 3 (three) times daily. 01/23/14   Kaitlyn Szekalski, PA-C   NEOMYCIN-POLYMYXIN-HYDROCORTISONE (CORTISPORIN) 1 % SOLN otic solution Place 3 drops into the right ear 4 (four) times daily. 03/16/14   Eulas Post, MD  [facility] 1/35 tablet Take 1 tablet by mouth daily. 10/16/13   Historical Provider, MD  scopolamine (TRANSDERM-SCOP) 1 MG/3DAYS Place 1 patch (1.5 mg total) onto the skin every 3 (three) days. 03/16/14   Eulas Post, MD   Triage Vitals: BP 143/87  Pulse 88  Temp(Src) 98.2 F (36.8 C) (Oral)  Resp 20  SpO2 97%  LMP 07/26/2013 Physical Exam  General: Well-developed, well-nourished female in no acute distress; appearance consistent with age of record HENT: normocephalic; atraumatic Eyes: pupils equal, round and reactive to light; extraocular muscles intact Neck: supple Heart: regular rate and rhythm; no murmurs, rubs or gallops Lungs: clear to auscultation bilaterally Abdomen: soft; nondistended; nontender; no masses or hepatosplenomegaly; bowel sounds present Extremities: Full range of motion; pulses normal. Tender swollen area over the anterior aspect of her right ankle. Not red or warm.  Neurologic: Awake, alert and oriented; motor function intact in all extremities and symmetric; no facial droop Skin: Warm and dry Psychiatric: Normal mood and affect   ED Course  Procedures (including critical care time) DIAGNOSTIC STUDIES: Oxygen Saturation is 97% on RA, adequate by my interpretation.    COORDINATION OF CARE: 11:35 PM- Will order Norco. Patient informed of current plan for treatment and evaluation and agrees with plan at this time.   MDM   Final diagnoses:  Pain in joint involving right ankle and foot   I personally performed the services described in this documentation, which was scribed in my presence. The recorded information has been reviewed and is accurate.   Wynetta Fines, MD 04/03/14 [phone]

## 2014-04-03 NOTE — ED Notes (Signed)
Pt would not allow me to place ASO on Lt ankle due to the pain. So, I gave the Pt and Pts family member instructions on how to apply the ASO.

## 2014-04-19 ENCOUNTER — Ambulatory Visit: Payer: 59 | Admitting: Family Medicine

## 2014-04-19 DIAGNOSIS — Z0289 Encounter for other administrative examinations: Secondary | ICD-10-CM

## 2014-04-23 ENCOUNTER — Emergency Department (HOSPITAL_BASED_OUTPATIENT_CLINIC_OR_DEPARTMENT_OTHER)
Admission: EM | Admit: 2014-04-23 | Discharge: 2014-04-24 | Disposition: A | Discharge status: Home / Self Care | Payer: 59 | Attending: Emergency Medicine | Admitting: Emergency Medicine

## 2014-04-23 ENCOUNTER — Encounter (HOSPITAL_BASED_OUTPATIENT_CLINIC_OR_DEPARTMENT_OTHER): Payer: Self-pay | Admitting: Emergency Medicine

## 2014-04-23 DIAGNOSIS — Z792 Long term (current) use of antibiotics: Secondary | ICD-10-CM | POA: Insufficient documentation

## 2014-04-23 DIAGNOSIS — Z79899 Other long term (current) drug therapy: Secondary | ICD-10-CM | POA: Diagnosis not present

## 2014-04-23 DIAGNOSIS — R51 Headache: Secondary | ICD-10-CM | POA: Insufficient documentation

## 2014-04-23 DIAGNOSIS — Z862 Personal history of diseases of the blood and blood-forming organs and certain disorders involving the immune mechanism: Secondary | ICD-10-CM | POA: Insufficient documentation

## 2014-04-23 DIAGNOSIS — Z3202 Encounter for pregnancy test, result negative: Secondary | ICD-10-CM | POA: Insufficient documentation

## 2014-04-23 DIAGNOSIS — Z8701 Personal history of pneumonia (recurrent): Secondary | ICD-10-CM | POA: Insufficient documentation

## 2014-04-23 DIAGNOSIS — Z8719 Personal history of other diseases of the digestive system: Secondary | ICD-10-CM | POA: Insufficient documentation

## 2014-04-23 DIAGNOSIS — Z8709 Personal history of other diseases of the respiratory system: Secondary | ICD-10-CM | POA: Insufficient documentation

## 2014-04-23 DIAGNOSIS — R011 Cardiac murmur, unspecified: Secondary | ICD-10-CM | POA: Insufficient documentation

## 2014-04-23 DIAGNOSIS — R519 Headache, unspecified: Secondary | ICD-10-CM

## 2014-04-23 LAB — PREGNANCY, URINE: Preg Test, Ur: NEGATIVE

## 2014-04-23 LAB — URINALYSIS, ROUTINE W REFLEX MICROSCOPIC
Bilirubin Urine: NEGATIVE
Glucose, UA: NEGATIVE mg/dL
Hgb urine dipstick: NEGATIVE
Ketones, ur: NEGATIVE mg/dL
Leukocytes, UA: NEGATIVE
Nitrite: NEGATIVE
Protein, ur: NEGATIVE mg/dL
Specific Gravity, Urine: 1.006 (ref 1.005–1.030)
Urobilinogen, UA: 1 mg/dL (ref 0.0–1.0)
pH: 7 (ref 5.0–8.0)

## 2014-04-23 MED ORDER — METOCLOPRAMIDE HCL 5 MG/ML IJ SOLN
10.0000 mg | Freq: Once | INTRAMUSCULAR | Status: AC
  Administered 2014-04-23: 10 mg via INTRAVENOUS
  Filled 2014-04-23: qty 2

## 2014-04-23 MED ORDER — SODIUM CHLORIDE 0.9 % IV BOLUS (SEPSIS)
1000.0000 mL | Freq: Once | INTRAVENOUS | Status: AC
  Administered 2014-04-23: 1000 mL via INTRAVENOUS

## 2014-04-23 MED ORDER — DIPHENHYDRAMINE HCL 50 MG/ML IJ SOLN
25.0000 mg | Freq: Once | INTRAMUSCULAR | Status: AC
  Administered 2014-04-23: 25 mg via INTRAVENOUS
  Filled 2014-04-23: qty 1

## 2014-04-23 MED ORDER — KETOROLAC TROMETHAMINE 30 MG/ML IJ SOLN
30.0000 mg | Freq: Once | INTRAMUSCULAR | Status: AC
  Administered 2014-04-23: 30 mg via INTRAVENOUS
  Filled 2014-04-23: qty 1

## 2014-04-23 NOTE — ED Notes (Signed)
Pt. C/O headache and feeling like she is going to pass out when she stands.  Pt. In no distress is alert and oriented.  Pt. Walked steady gait to restroom and back to room.

## 2014-04-23 NOTE — ED Notes (Signed)
Pt w/ HA x2hrs - admits to nausea, denies vomiting - pt noted to be hypertensive at home by family BP 154/114.

## 2014-04-23 NOTE — ED Provider Notes (Signed)
CSN: [id number]     Arrival date & time 04/23/14  2147 History  This chart was scribed for Diane Fines, MD by Donato Schultz, ED Scribe. This patient was seen in room MH07/MH07 and the patient's care was started at 10:53 PM.     Chief Complaint  Patient presents with  . Headache    The history is provided by the patient. No language interpreter was used.   HPI Comments: Diane [name] is a 33 y.o. female who presents to the Emergency Department complaining of a severe, throbbing left frontal headache with associated nausea that started between 7:30 PM and 8:30 PM this evening.  She states that standing aggravates the headache and causes her to feel dizzy.  She rates the pain at a 10/10.  She states that this headache is not as severe as her usual migraines. There is equivocal photophobia.   Past Medical History  Diagnosis Date  . Crohn's disease   . PONV (postoperative nausea and vomiting)   . Pneumonia     history back in 2006  . Heart murmur     as child, never had any problems  . Anxiety   . Seasonal allergies   . Headache(784.0)     otc meds - last migraine 2009  . Low blood potassium     history  . Crohn's disease     history  . Anemia   . History of endometriosis   . Migraine without aura, with intractable migraine, so stated, without mention of status migrainosus 06/19/2013  . Vasovagal syncope    Past Surgical History  Procedure Laterality Date  . Tonsillectomy    . Adenoidectomy    . Dilatation & currettage/hysteroscopy with resectocope N/A 06/09/2013    Procedure: DILATATION & CURETTAGE/HYSTEROSCOPY WITH HYSTEROSCOPIC RESECTION OF SUBMUCOSAL FIBROID AND ENDOMETRIAL POLYP;  Surgeon: Marvene Staff, MD;  Location: [facility];  Service: Gynecology;  Laterality: N/A;  . Laparoscopy N/A 06/09/2013    Procedure: LAPAROSCOPY DIAGNOSTIC;  Surgeon: Marvene Staff, MD;  Location: [facility];  Service: Gynecology;  Laterality: N/A;  . Robotic assisted laparoscopic lysis of  adhesion N/A 06/09/2013    Procedure: ROBOTIC ASSISTED LAPAROSCOPIC LYSIS OF ADHESION; Resection of Endometriosis and excision of fibroid;  Surgeon: Marvene Staff, MD;  Location: [facility];  Service: Gynecology;  Laterality: N/A;   Family History  Problem Relation Age of Onset  . Heart disease Mother 33    CHF  . Crohn's disease Mother   . Asthma Brother    History  Substance Use Topics  . Smoking status: Never Smoker   . Smokeless tobacco: Never Used  . Alcohol Use: No   OB History   Grav Para Term Preterm Abortions TAB SAB Ect Mult Living                 Review of Systems  All other systems reviewed and are negative.     Allergies  Review of patient's allergies indicates no known allergies.  Home Medications   Prior to Admission medications   Medication Sig Start Date End Date Taking? Authorizing Provider  amoxicillin (AMOXIL) 875 MG tablet Take 1 tablet (875 mg total) by mouth 2 (two) times daily. 03/16/14   Eulas Post, MD  amphetamine-dextroamphetamine (ADDERALL) 30 MG tablet Take 30 mg by mouth daily. 01/04/14   Historical Provider, MD  fluconazole (DIFLUCAN) 150 MG tablet Take 1 tablet (150 mg total) by mouth daily. 03/16/14   Eulas Post, MD  FLUoxetine (PROZAC) 20 MG capsule Take 20 mg by mouth daily.    Historical Provider, MD  HYDROcodone-acetaminophen (NORCO/VICODIN) 5-325 MG per tablet Take 1-2 tablets by mouth every 6 (six) hours as needed. 04/02/14   Karen Chafe Chessica Audia, MD  ibuprofen (ADVIL,MOTRIN) 800 MG tablet Take 800 mg by mouth every 8 (eight) hours as needed (FOR PAIN.).  11/23/13   Historical Provider, MD  ibuprofen (ADVIL,MOTRIN) 800 MG tablet Take 1 tablet (800 mg total) by mouth 3 (three) times daily. 01/23/14   Kaitlyn Szekalski, PA-C  NEOMYCIN-POLYMYXIN-HYDROCORTISONE (CORTISPORIN) 1 % SOLN otic solution Place 3 drops into the right ear 4 (four) times daily. 03/16/14   Eulas Post, MD  [facility] 1/35 tablet Take 1 tablet by mouth daily.  10/16/13   Historical Provider, MD  scopolamine (TRANSDERM-SCOP) 1 MG/3DAYS Place 1 patch (1.5 mg total) onto the skin every 3 (three) days. 03/16/14   Eulas Post, MD   Triage Vitals: BP 143/94  Pulse 120  Temp(Src) 98.7 F (37.1 C) (Oral)  Resp 24  Ht 5\' 10"  (1.778 m)  Wt 177 lb (80.287 kg)  BMI 25.40 kg/m2  SpO2 98%  LMP 07/26/2013  Physical Exam  Nursing note and vitals reviewed. Constitutional: She is oriented to person, place, and time. She appears well-developed and well-nourished.  HENT:  Head: Normocephalic and atraumatic.  Eyes: EOM are normal.  Neck: Normal range of motion.  Cardiovascular: Normal rate and regular rhythm.  Exam reveals no gallop and no friction rub.   No murmur heard. Not tachycardic.   Pulmonary/Chest: Effort normal and breath sounds normal. No respiratory distress. She has no wheezes. She has no rales.  Abdominal: Soft. There is no tenderness.  Musculoskeletal: Normal range of motion.  Neurological: She is alert and oriented to person, place, and time. She displays a negative Romberg sign. Coordination normal.  Skin: Skin is warm and dry.  Tender nodule of the right shin.   Psychiatric: She has a normal mood and affect. Her behavior is normal.    ED Course  Procedures (including critical care time)  DIAGNOSTIC STUDIES: Oxygen Saturation is 98% on room air, normal by my interpretation.    COORDINATION OF CARE:   MDM   Nursing notes and vitals signs, including pulse oximetry, reviewed.  Summary of this visit's results, reviewed by myself:  Labs:  Results for orders placed during the hospital encounter of 04/23/14 (from the past 24 hour(s))  URINALYSIS, ROUTINE W REFLEX MICROSCOPIC     Status: None   Collection Time    04/23/14 10:10 PM      Result Value Ref Range   Color, Urine YELLOW  YELLOW   APPearance CLEAR  CLEAR   Specific Gravity, Urine 1.006  1.005 - 1.030   pH 7.0  5.0 - 8.0   Glucose, UA NEGATIVE  NEGATIVE mg/dL    Hgb urine dipstick NEGATIVE  NEGATIVE   Bilirubin Urine NEGATIVE  NEGATIVE   Ketones, ur NEGATIVE  NEGATIVE mg/dL   Protein, ur NEGATIVE  NEGATIVE mg/dL   Urobilinogen, UA 1.0  0.0 - 1.0 mg/dL   Nitrite NEGATIVE  NEGATIVE   Leukocytes, UA NEGATIVE  NEGATIVE  PREGNANCY, URINE     Status: None   Collection Time    04/23/14 10:10 PM      Result Value Ref Range   Preg Test, Ur NEGATIVE  NEGATIVE   1:00 AM Patient significantly improved after IV medications and fluid bolus. She states she is ready to go home  I personally performed the services described in this documentation, which was scribed in my presence. The recorded information has been reviewed and is accurate.   Karen Chafe Jowanna Loeffler, MD 04/24/14 0100

## 2014-04-28 ENCOUNTER — Telehealth: Payer: Self-pay | Admitting: Family Medicine

## 2014-04-28 NOTE — Telephone Encounter (Signed)
Noted  

## 2014-04-28 NOTE — Telephone Encounter (Signed)
New patient appointment no showed on 7/27.  Please advise.

## 2014-05-04 ENCOUNTER — Ambulatory Visit: Payer: Self-pay | Admitting: Nurse Practitioner

## 2014-06-14 ENCOUNTER — Encounter: Payer: Self-pay | Admitting: Gastroenterology

## 2014-07-06 ENCOUNTER — Ambulatory Visit (INDEPENDENT_AMBULATORY_CARE_PROVIDER_SITE_OTHER): Payer: 59 | Admitting: Family Medicine

## 2014-07-06 ENCOUNTER — Encounter: Payer: Self-pay | Admitting: Family Medicine

## 2014-07-06 VITALS — BP 130/90 | HR 84 | Temp 98.5°F | Wt 167.0 lb

## 2014-07-06 DIAGNOSIS — H9201 Otalgia, right ear: Secondary | ICD-10-CM

## 2014-07-06 NOTE — Progress Notes (Signed)
Pre visit review using our clinic review tool, if applicable. No additional management support is needed unless otherwise documented below in the visit note. 

## 2014-07-06 NOTE — Patient Instructions (Signed)
Otalgia  The most common reason for this in children is an infection of the middle ear. Pain from the middle ear is usually caused by a build-up of fluid and pressure behind the eardrum. Pain from an earache can be sharp, dull, or burning. The pain may be temporary or constant. The middle ear is connected to the nasal passages by a short narrow tube called the Eustachian tube. The Eustachian tube allows fluid to drain out of the middle ear, and helps keep the pressure in your ear equalized.  CAUSES   A cold or allergy can block the Eustachian tube with inflammation and the build-up of secretions. This is especially likely in small children, because their Eustachian tube is shorter and more horizontal. When the Eustachian tube closes, the normal flow of fluid from the middle ear is stopped. Fluid can accumulate and cause stuffiness, pain, hearing loss, and an ear infection if germs start growing in this area.  SYMPTOMS   The symptoms of an ear infection may include fever, ear pain, fussiness, increased crying, and irritability. Many children will have temporary and minor hearing loss during and right after an ear infection. Permanent hearing loss is rare, but the risk increases the more infections a child has. Other causes of ear pain include retained water in the outer ear canal from swimming and bathing.  Ear pain in adults is less likely to be from an ear infection. Ear pain may be referred from other locations. Referred pain may be from the joint between your jaw and the skull. It may also come from a tooth problem or problems in the neck. Other causes of ear pain include:   A foreign body in the ear.   Outer ear infection.   Sinus infections.   Impacted ear wax.   Ear injury.   Arthritis of the jaw or TMJ problems.   Middle ear infection.   Tooth infections.   Sore throat with pain to the ears.  DIAGNOSIS   Your caregiver can usually make the diagnosis by examining you. Sometimes other special studies,  including x-rays and lab work may be necessary.  TREATMENT    If antibiotics were prescribed, use them as directed and finish them even if you or your child's symptoms seem to be improved.   Sometimes PE tubes are needed in children. These are little plastic tubes which are put into the eardrum during a simple surgical procedure. They allow fluid to drain easier and allow the pressure in the middle ear to equalize. This helps relieve the ear pain caused by pressure changes.  HOME CARE INSTRUCTIONS    Only take over-the-counter or prescription medicines for pain, discomfort, or fever as directed by your caregiver. DO NOT GIVE CHILDREN ASPIRIN because of the association of Reye's Syndrome in children taking aspirin.   Use a cold pack applied to the outer ear for 15-20 minutes, 03-04 times per day or as needed may reduce pain. Do not apply ice directly to the skin. You may cause frost bite.   Over-the-counter ear drops used as directed may be effective. Your caregiver may sometimes prescribe ear drops.   Resting in an upright position may help reduce pressure in the middle ear and relieve pain.   Ear pain caused by rapidly descending from high altitudes can be relieved by swallowing or chewing gum. Allowing infants to suck on a bottle during airplane travel can help.   Do not smoke in the house or near children. If you are   unable to quit smoking, smoke outside.   Control allergies.  SEEK IMMEDIATE MEDICAL CARE IF:    You or your child are becoming sicker.   Pain or fever relief is not obtained with medicine.   You or your child's symptoms (pain, fever, or irritability) do not improve within 24 to 48 hours or as instructed.   Severe pain suddenly stops hurting. This may indicate a ruptured eardrum.   You or your children develop new problems such as severe headaches, stiff neck, difficulty swallowing, or swelling of the face or around the ear.  Document Released: 04/27/2004 Document Revised: 12/03/2011  Document Reviewed: 09/01/2008  [employer] Patient Information 2015 [employer]. This information is not intended to replace advice given to you by your health care provider. Make sure you discuss any questions you have with your health care provider.

## 2014-07-06 NOTE — Progress Notes (Signed)
   Subjective:    Patient ID: Diane [name], female    DOB: 03-30-1981, 33 y.o.   MRN: [id number]  Otalgia  Pertinent negatives include no headaches or sore throat.    Patient seen with right ear pain. She's had tympanostomy tubes x2. Onset this past Friday. Denies any recent URI-type symptoms. No drainage. No fevers or chills. No sore throat. Denies any TMJ pain. No exacerbating factors. No alleviating factors.  Past Medical History  Diagnosis Date  . Crohn's disease   . PONV (postoperative nausea and vomiting)   . Pneumonia     history back in 2006  . Heart murmur     as child, never had any problems  . Anxiety   . Seasonal allergies   . Headache(784.0)     otc meds - last migraine 2009  . Low blood potassium     history  . Crohn's disease     history  . Anemia   . History of endometriosis   . Migraine without aura, with intractable migraine, so stated, without mention of status migrainosus 06/19/2013  . Vasovagal syncope    Past Surgical History  Procedure Laterality Date  . Tonsillectomy    . Adenoidectomy    . Dilatation & currettage/hysteroscopy with resectocope N/A 06/09/2013    Procedure: DILATATION & CURETTAGE/HYSTEROSCOPY WITH HYSTEROSCOPIC RESECTION OF SUBMUCOSAL FIBROID AND ENDOMETRIAL POLYP;  Surgeon: Marvene Staff, MD;  Location: [facility];  Service: Gynecology;  Laterality: N/A;  . Laparoscopy N/A 06/09/2013    Procedure: LAPAROSCOPY DIAGNOSTIC;  Surgeon: Marvene Staff, MD;  Location: [facility];  Service: Gynecology;  Laterality: N/A;  . Robotic assisted laparoscopic lysis of adhesion N/A 06/09/2013    Procedure: ROBOTIC ASSISTED LAPAROSCOPIC LYSIS OF ADHESION; Resection of Endometriosis and excision of fibroid;  Surgeon: Marvene Staff, MD;  Location: [facility];  Service: Gynecology;  Laterality: N/A;    reports that she has never smoked. She has never used smokeless tobacco. She reports that she does not drink alcohol or use illicit drugs. family  history includes Asthma in her brother; Crohn's disease in her mother; Heart disease (age of onset: 63) in her mother. No Known Allergies   Review of Systems  Constitutional: Negative for fever and chills.  HENT: Positive for ear pain. Negative for sore throat.   Neurological: Negative for headaches.       Objective:   Physical Exam  Constitutional: She appears well-developed and well-nourished.  HENT:  Mouth/Throat: Oropharynx is clear and moist.  T-tube is in place right ear canal. No TM erythema. No visible drainage.  Neck: Neck supple.  Cardiovascular: Normal rate and regular rhythm.   Pulmonary/Chest: Effort normal and breath sounds normal.  Lymphadenopathy:    She has no cervical adenopathy.          Assessment & Plan:  Right otalgia. Basically normal exam as above with no acute findings. She has tympanostomy tube in place with no visible drainage. Her pain is actually significant improvement compared to yesterday. Recommend observation for now and followup with ENT if symptoms persist

## 2014-07-21 ENCOUNTER — Emergency Department (HOSPITAL_BASED_OUTPATIENT_CLINIC_OR_DEPARTMENT_OTHER): Payer: 59

## 2014-07-21 ENCOUNTER — Encounter (HOSPITAL_BASED_OUTPATIENT_CLINIC_OR_DEPARTMENT_OTHER): Payer: Self-pay | Admitting: Emergency Medicine

## 2014-07-21 ENCOUNTER — Emergency Department (HOSPITAL_BASED_OUTPATIENT_CLINIC_OR_DEPARTMENT_OTHER)
Admission: EM | Admit: 2014-07-21 | Discharge: 2014-07-22 | Disposition: A | Discharge status: Home / Self Care | Payer: 59 | Attending: Emergency Medicine | Admitting: Emergency Medicine

## 2014-07-21 DIAGNOSIS — Z3202 Encounter for pregnancy test, result negative: Secondary | ICD-10-CM | POA: Diagnosis not present

## 2014-07-21 DIAGNOSIS — Z8719 Personal history of other diseases of the digestive system: Secondary | ICD-10-CM | POA: Insufficient documentation

## 2014-07-21 DIAGNOSIS — R011 Cardiac murmur, unspecified: Secondary | ICD-10-CM | POA: Insufficient documentation

## 2014-07-21 DIAGNOSIS — R51 Headache: Secondary | ICD-10-CM | POA: Insufficient documentation

## 2014-07-21 DIAGNOSIS — R319 Hematuria, unspecified: Secondary | ICD-10-CM | POA: Diagnosis not present

## 2014-07-21 DIAGNOSIS — D649 Anemia, unspecified: Secondary | ICD-10-CM | POA: Insufficient documentation

## 2014-07-21 DIAGNOSIS — M549 Dorsalgia, unspecified: Secondary | ICD-10-CM | POA: Diagnosis present

## 2014-07-21 DIAGNOSIS — Z8679 Personal history of other diseases of the circulatory system: Secondary | ICD-10-CM | POA: Diagnosis not present

## 2014-07-21 DIAGNOSIS — Z8701 Personal history of pneumonia (recurrent): Secondary | ICD-10-CM | POA: Diagnosis not present

## 2014-07-21 DIAGNOSIS — F419 Anxiety disorder, unspecified: Secondary | ICD-10-CM | POA: Insufficient documentation

## 2014-07-21 DIAGNOSIS — M62830 Muscle spasm of back: Secondary | ICD-10-CM | POA: Insufficient documentation

## 2014-07-21 DIAGNOSIS — Z8742 Personal history of other diseases of the female genital tract: Secondary | ICD-10-CM | POA: Diagnosis not present

## 2014-07-21 DIAGNOSIS — Z79899 Other long term (current) drug therapy: Secondary | ICD-10-CM | POA: Insufficient documentation

## 2014-07-21 LAB — URINALYSIS, ROUTINE W REFLEX MICROSCOPIC
Bilirubin Urine: NEGATIVE
Glucose, UA: NEGATIVE mg/dL
Ketones, ur: 15 mg/dL — AB
Leukocytes, UA: NEGATIVE
Nitrite: NEGATIVE
Protein, ur: NEGATIVE mg/dL
Specific Gravity, Urine: 1.011 (ref 1.005–1.030)
Urobilinogen, UA: 1 mg/dL (ref 0.0–1.0)
pH: 7 (ref 5.0–8.0)

## 2014-07-21 LAB — URINE MICROSCOPIC-ADD ON

## 2014-07-21 LAB — PREGNANCY, URINE: Preg Test, Ur: NEGATIVE

## 2014-07-21 MED ORDER — OXYCODONE-ACETAMINOPHEN 5-325 MG PO TABS
1.0000 | ORAL_TABLET | Freq: Once | ORAL | Status: AC
  Administered 2014-07-21: 1 via ORAL
  Filled 2014-07-21: qty 1

## 2014-07-21 MED ORDER — KETOROLAC TROMETHAMINE 60 MG/2ML IM SOLN
60.0000 mg | Freq: Once | INTRAMUSCULAR | Status: AC
  Administered 2014-07-21: 60 mg via INTRAMUSCULAR
  Filled 2014-07-21: qty 2

## 2014-07-21 NOTE — ED Notes (Signed)
Pt denies urge to void

## 2014-07-21 NOTE — ED Notes (Signed)
Lower back pain started 4pm today while at work-denies injury

## 2014-07-21 NOTE — ED Notes (Signed)
Patient transported to CT 

## 2014-07-21 NOTE — ED Notes (Signed)
MD at bedside. 

## 2014-07-21 NOTE — ED Provider Notes (Signed)
CSN: [id number]     Arrival date & time 07/21/14  2215 History   This chart was scribed for Nicholas Ossa Alfonso Patten, MD by Tula Nakayama, ED Scribe. This patient was seen in room MH08/MH08 and the patient's care was started at 11:41 PM.     Chief Complaint  Patient presents with  . Back Pain   Patient is a 33 y.o. female presenting with back pain. The history is provided by the patient. No language interpreter was used.  Back Pain Location:  Sacro-iliac joint Quality:  Aching Pain severity:  Severe Pain is:  Same all the time Onset quality:  Sudden Timing:  Constant Progression:  Unchanged Chronicity:  New Context: not recent injury   Relieved by:  Nothing Worsened by:  Nothing tried Ineffective treatments:  None tried Associated symptoms: no abdominal pain, no bladder incontinence, no bowel incontinence, no dysuria, no fever, no numbness, no paresthesias, no perianal numbness, no tingling, no weakness and no weight loss   Risk factors: no hx of cancer    HPI Comments: Diane [name] is a 33 y.o. female who presents to the Emergency Department complaining of middle, lower back pain that started earlier today. She states pain is worse on left than right and that pain becomes worse and radiates up her back with movement. The pain started while she was sitting at a desk at work and she denies any recent trauma to the area. Pt states that she is currently menstruating, but does not have a regular menstrual cycle because she skips the placebo portion of oral contraceptives. Pt has a history of tonsillectomy and endometriosis. She denies vomiting, constipation, pain with urination, and changes in urination as associated symptoms.  Now admits she has vaginal bleeding that started today  Surgeon for laparoscopy was Garwin Brothers  Past Medical History  Diagnosis Date  . Crohn's disease   . PONV (postoperative nausea and vomiting)   . Pneumonia     history back in 2006  . Heart murmur     as  child, never had any problems  . Anxiety   . Seasonal allergies   . Headache(784.0)     otc meds - last migraine 2009  . Low blood potassium     history  . Crohn's disease     history  . Anemia   . History of endometriosis   . Migraine without aura, with intractable migraine, so stated, without mention of status migrainosus 06/19/2013  . Vasovagal syncope    Past Surgical History  Procedure Laterality Date  . Tonsillectomy    . Adenoidectomy    . Dilatation & currettage/hysteroscopy with resectocope N/A 06/09/2013    Procedure: DILATATION & CURETTAGE/HYSTEROSCOPY WITH HYSTEROSCOPIC RESECTION OF SUBMUCOSAL FIBROID AND ENDOMETRIAL POLYP;  Surgeon: Marvene Staff, MD;  Location: [facility];  Service: Gynecology;  Laterality: N/A;  . Laparoscopy N/A 06/09/2013    Procedure: LAPAROSCOPY DIAGNOSTIC;  Surgeon: Marvene Staff, MD;  Location: [facility];  Service: Gynecology;  Laterality: N/A;  . Robotic assisted laparoscopic lysis of adhesion N/A 06/09/2013    Procedure: ROBOTIC ASSISTED LAPAROSCOPIC LYSIS OF ADHESION; Resection of Endometriosis and excision of fibroid;  Surgeon: Marvene Staff, MD;  Location: [facility];  Service: Gynecology;  Laterality: N/A;   Family History  Problem Relation Age of Onset  . Heart disease Mother 24    CHF  . Crohn's disease Mother   . Asthma Brother    History  Substance Use Topics  . Smoking status: Never  Smoker   . Smokeless tobacco: Never Used  . Alcohol Use: No   OB History   Grav Para Term Preterm Abortions TAB SAB Ect Mult Living                 Review of Systems  Constitutional: Negative for fever and weight loss.  Gastrointestinal: Negative for vomiting, abdominal pain, constipation and bowel incontinence.  Genitourinary: Negative for bladder incontinence, dysuria, hematuria and difficulty urinating.  Musculoskeletal: Positive for back pain.  Neurological: Negative for tingling, weakness, numbness and paresthesias.  All other  systems reviewed and are negative.   Allergies  Review of patient's allergies indicates no known allergies.  Home Medications   Prior to Admission medications   Medication Sig Start Date End Date Taking? Authorizing Provider  FOLIC ACID PO Take by mouth.   Yes Historical Provider, MD  amphetamine-dextroamphetamine (ADDERALL) 30 MG tablet Take 30 mg by mouth daily. 01/04/14   Historical Provider, MD  FLUoxetine (PROZAC) 20 MG capsule Take 20 mg by mouth daily.    Historical Provider, MD  HYDROcodone-acetaminophen (NORCO/VICODIN) 5-325 MG per tablet Take 1-2 tablets by mouth every 6 (six) hours as needed. 04/02/14   John L Molpus, MD  ibuprofen (ADVIL,MOTRIN) 800 MG tablet Take 1 tablet (800 mg total) by mouth 3 (three) times daily. 01/23/14   Alvina Chou, PA-C  [facility] 1/35 tablet Take 1 tablet by mouth daily. 10/16/13   Historical Provider, MD   BP 134/93  Pulse 110  Temp(Src) 98.5 F (36.9 C) (Oral)  Resp 16  Wt 166 lb (75.297 kg)  SpO2 99% Physical Exam  Nursing note and vitals reviewed. Constitutional: She is oriented to person, place, and time. She appears well-developed and well-nourished. No distress.  HENT:  Head: Normocephalic and atraumatic.  Mouth/Throat: Oropharynx is clear and moist. No oropharyngeal exudate.  Eyes: Pupils are equal, round, and reactive to light.  Neck: Neck supple.  Cardiovascular: Normal rate, regular rhythm and normal heart sounds.   Pulmonary/Chest: Effort normal and breath sounds normal. No respiratory distress. She has no wheezes.  Abdominal: Soft. Bowel sounds are normal. There is no tenderness. There is no rebound and no guarding.  Musculoskeletal: Normal range of motion. She exhibits no edema and no tenderness.  Neurological: She is alert and oriented to person, place, and time. She has normal reflexes. No cranial nerve deficit. She exhibits normal muscle tone.  Skin: Skin is warm and dry. No rash noted.  Psychiatric: She has a normal mood  and affect. Her behavior is normal.    ED Course  Procedures (including critical care time) DIAGNOSTIC STUDIES: Oxygen Saturation is 99% on RA, normal by my interpretation.    COORDINATION OF CARE: 11:47 PM Discussed treatment plan with pt at bedside and pt agreed to plan.  Labs Review Labs Reviewed  URINALYSIS, ROUTINE W REFLEX MICROSCOPIC - Abnormal; Notable for the following:    Hgb urine dipstick LARGE (*)    Ketones, ur 15 (*)    All other components within normal limits  PREGNANCY, URINE  URINE MICROSCOPIC-ADD ON    Imaging Review No results found.   EKG Interpretation None      MDM   Final diagnoses:  None    Will treat for back spasm with pain medication and muscle relaxants follow up with Dr. Elease Hashimoto for recheck  I personally performed the services described in this documentation, which was scribed in my presence. The recorded information has been reviewed and is accurate.  Carlisle Beers, MD 07/22/14 [phone]

## 2014-07-22 ENCOUNTER — Encounter (HOSPITAL_BASED_OUTPATIENT_CLINIC_OR_DEPARTMENT_OTHER): Payer: Self-pay | Admitting: Emergency Medicine

## 2014-07-22 MED ORDER — ONDANSETRON 8 MG PO TBDP
8.0000 mg | ORAL_TABLET | Freq: Once | ORAL | Status: AC
  Administered 2014-07-22: 8 mg via ORAL
  Filled 2014-07-22: qty 1

## 2014-07-22 MED ORDER — MELOXICAM 7.5 MG PO TABS: 7.5000 mg | ORAL_TABLET | Freq: Every day | ORAL | Status: DC

## 2014-07-22 MED ORDER — TRAMADOL HCL 50 MG PO TABS: 50.0000 mg | ORAL_TABLET | Freq: Four times a day (QID) | ORAL | Status: DC | PRN

## 2014-07-22 MED ORDER — METHOCARBAMOL 500 MG PO TABS: 500.0000 mg | ORAL_TABLET | Freq: Two times a day (BID) | ORAL | Status: DC

## 2014-07-22 NOTE — ED Notes (Signed)
Pt nauseated from po pain medication, sl antiemetic given, family member at bedside

## 2014-07-22 NOTE — ED Notes (Signed)
MD at bedside discussing test results and dispo plan of care. 

## 2014-07-29 ENCOUNTER — Inpatient Hospital Stay (HOSPITAL_COMMUNITY)
Admission: AD | Admit: 2014-07-29 | Discharge: 2014-07-30 | Disposition: A | Discharge status: Home / Self Care | Payer: 59 | Source: Ambulatory Visit | Attending: Obstetrics and Gynecology | Admitting: Obstetrics and Gynecology

## 2014-07-29 ENCOUNTER — Encounter (HOSPITAL_COMMUNITY): Payer: Self-pay | Admitting: *Deleted

## 2014-07-29 DIAGNOSIS — N920 Excessive and frequent menstruation with regular cycle: Secondary | ICD-10-CM | POA: Diagnosis present

## 2014-07-29 DIAGNOSIS — N809 Endometriosis, unspecified: Secondary | ICD-10-CM | POA: Diagnosis not present

## 2014-07-29 LAB — URINALYSIS, ROUTINE W REFLEX MICROSCOPIC
Bilirubin Urine: NEGATIVE
Glucose, UA: NEGATIVE mg/dL
Ketones, ur: NEGATIVE mg/dL
Leukocytes, UA: NEGATIVE
Nitrite: NEGATIVE
Protein, ur: NEGATIVE mg/dL
Specific Gravity, Urine: <1.005 — ABNORMAL LOW (ref 1.005–1.030)
Urobilinogen, UA: 0.2 mg/dL (ref 0.0–1.0)
pH: 6.5 (ref 5.0–8.0)

## 2014-07-29 LAB — CBC
HCT: 35.6 % — ABNORMAL LOW (ref 36.0–46.0)
Hemoglobin: 12.5 g/dL (ref 12.0–15.0)
MCH: 29.3 pg (ref 26.0–34.0)
MCHC: 35.1 g/dL (ref 30.0–36.0)
MCV: 83.4 fL (ref 78.0–100.0)
Platelets: 278 10*3/uL (ref 150–400)
RBC: 4.27 MIL/uL (ref 3.87–5.11)
RDW: 12.7 % (ref 11.5–15.5)
WBC: 6.5 10*3/uL (ref 4.0–10.5)

## 2014-07-29 LAB — POCT PREGNANCY, URINE: Preg Test, Ur: NEGATIVE

## 2014-07-29 LAB — URINE MICROSCOPIC-ADD ON

## 2014-07-29 NOTE — MAU Note (Signed)
PT SAYS    SHE STARTED BLEEDING HEAVY   AT 7 PM-  HAD SURGERY 05-2013-  ON LUPRON.   AND BCP .     ON ARRIVAL- PAD ON     - SMALL AMT  RED BLEEDING ON PAD.   CRAMPS BEGAN AT 7 PM.  -  NO MEDS.

## 2014-07-29 NOTE — MAU Note (Signed)
Pt reports she had surgery one year ago, dx endometriosis. On Lupron until January and started BCP's in March. Has only had spotting off/on until Saturday and then she started having heavy bleeding. States she had to change her pad 3 times this am in 2 hours and then nothing until this pm and it started again. States she is having stabbing pain in lower abd. Has been anemic and feels like she may be now.

## 2014-07-30 ENCOUNTER — Encounter (HOSPITAL_COMMUNITY): Payer: Self-pay | Admitting: *Deleted

## 2014-07-30 MED ORDER — IBUPROFEN 800 MG PO TABS
800.0000 mg | ORAL_TABLET | Freq: Once | ORAL | Status: AC
  Administered 2014-07-30: 800 mg via ORAL
  Filled 2014-07-30: qty 1

## 2014-07-30 MED ORDER — TRAMADOL HCL 50 MG PO TABS: 50.0000 mg | ORAL_TABLET | Freq: Four times a day (QID) | ORAL | Status: DC | PRN

## 2014-07-30 NOTE — [facility] Provider Note (Signed)
History     CSN: [id number]  Arrival date and time: 07/29/14 2046 Provider notified: 07/29/2014 2220 Provider notified of results: 07/29/2014 2332 Provider at bedside: 07/30/14 0027      Chief Complaint  Patient presents with  . Vaginal Bleeding  . Abdominal Pain   Vaginal Bleeding The patient's primary symptoms include vaginal bleeding. This is a recurrent problem. The current episode started in the past 7 days. The problem occurs intermittently. The problem has been gradually improving. The pain is moderate. The problem affects the right side. She is not pregnant. Associated symptoms include abdominal pain. The vaginal bleeding is heavier than menses. She has been passing clots. She has not been passing tissue. The symptoms are aggravated by activity. She has tried NSAIDs for the symptoms. The treatment provided moderate relief. She is sexually active. No, her partner does not have an STD. She uses oral contraceptives for contraception. Her menstrual history has been regular. Her past medical history is significant for endometriosis, a gynecological surgery and menorrhagia.  Abdominal Pain    [name] is a 33 yo G2P2002 non-pregnant presenting with complaints of intermittently bright, red, heavy vaginal bleeding that started 10/31.  Her bleeding has been heavy off and on.  She spoke to a RN today and was given an appointment to be evaluated tomorrow.  Her bleeding was not that heavy when she spoke to the office RN this evening. However, she started "gushing blood" when she was getting off work.  She was currently taking Lupron to help control her bleeding.  She is also taking OCPs.  She has a h/o endometriosis.  She had a D&C and removal of endometriosis and a fibroid last year.  Her primary GYN provider is Dr. Garwin Brothers.  Past Medical History  Diagnosis Date  . Crohn's disease   . PONV (postoperative nausea and vomiting)   . Pneumonia     history back in 2006  . Heart murmur      as child, never had any problems  . Anxiety   . Seasonal allergies   . Headache(784.0)     otc meds - last migraine 2009  . Low blood potassium     history  . Crohn's disease     history  . Anemia   . History of endometriosis   . Migraine without aura, with intractable migraine, so stated, without mention of status migrainosus 06/19/2013  . Vasovagal syncope     Past Surgical History  Procedure Laterality Date  . Tonsillectomy    . Adenoidectomy    . Dilatation & currettage/hysteroscopy with resectocope N/A 06/09/2013    Procedure: DILATATION & CURETTAGE/HYSTEROSCOPY WITH HYSTEROSCOPIC RESECTION OF SUBMUCOSAL FIBROID AND ENDOMETRIAL POLYP;  Surgeon: Marvene Staff, MD;  Location: [facility];  Service: Gynecology;  Laterality: N/A;  . Laparoscopy N/A 06/09/2013    Procedure: LAPAROSCOPY DIAGNOSTIC;  Surgeon: Marvene Staff, MD;  Location: [facility];  Service: Gynecology;  Laterality: N/A;  . Robotic assisted laparoscopic lysis of adhesion N/A 06/09/2013    Procedure: ROBOTIC ASSISTED LAPAROSCOPIC LYSIS OF ADHESION; Resection of Endometriosis and excision of fibroid;  Surgeon: Marvene Staff, MD;  Location: [facility];  Service: Gynecology;  Laterality: N/A;    Family History  Problem Relation Age of Onset  . Heart disease Mother 63    CHF  . Crohn's disease Mother   . Asthma Brother     History  Substance Use Topics  . Smoking status: Never Smoker   . Smokeless tobacco:  Never Used  . Alcohol Use: No    Allergies: No Known Allergies  Prescriptions prior to admission  Medication Sig Dispense Refill Last Dose  . amphetamine-dextroamphetamine (ADDERALL) 30 MG tablet Take 30 mg by mouth daily.   07/29/2014 at Unknown time  . FLUoxetine (PROZAC) 20 MG capsule Take 20 mg by mouth daily.   07/29/2014 at Unknown time  . FOLIC ACID PO Take by mouth.   07/28/2014 at Unknown time  . ibuprofen (ADVIL,MOTRIN) 800 MG tablet Take 1 tablet (800 mg total) by mouth 3 (three) times  daily. 21 tablet 0 07/28/2014 at Unknown time  . PIRMELLA 1/35 tablet Take 1 tablet by mouth daily.   07/28/2014 at Unknown time  . traMADol (ULTRAM) 50 MG tablet Take 1 tablet (50 mg total) by mouth every 6 (six) hours as needed for severe pain. 9 tablet 0 Past Week at Unknown time  . HYDROcodone-acetaminophen (NORCO/VICODIN) 5-325 MG per tablet Take 1-2 tablets by mouth every 6 (six) hours as needed. 20 tablet 0 More than a month at Unknown time  . meloxicam (MOBIC) 7.5 MG tablet Take 1 tablet (7.5 mg total) by mouth daily. 10 tablet 0 NONE  . methocarbamol (ROBAXIN) 500 MG tablet Take 1 tablet (500 mg total) by mouth 2 (two) times daily. 20 tablet 0 NONE    Review of Systems  Constitutional: Negative.   HENT: Negative.   Eyes: Negative.   Respiratory: Negative.   Cardiovascular: Negative.   Gastrointestinal: Positive for abdominal pain.       RLQ sharp, stabbing pain that increases with standing  Genitourinary: Positive for vaginal bleeding and menorrhagia.       Intermittent, Heavy vaginal bleeding since 07/24/2014; not much bleeding today, but started pouring out after getting off work this evening (about 1900)  Musculoskeletal: Negative.   Skin: Negative.   Neurological: Negative.   Endo/Heme/Allergies: Negative.   Psychiatric/Behavioral: Negative.     Results for orders placed or performed during the hospital encounter of 07/29/14 (from the past 24 hour(s))  Urinalysis, Routine w reflex microscopic     Status: Abnormal   Collection Time: 07/29/14  9:16 PM  Result Value Ref Range   Color, Urine YELLOW YELLOW   APPearance CLEAR CLEAR   Specific Gravity, Urine <1.005 (L) 1.005 - 1.030   pH 6.5 5.0 - 8.0   Glucose, UA NEGATIVE NEGATIVE mg/dL   Hgb urine dipstick MODERATE (A) NEGATIVE   Bilirubin Urine NEGATIVE NEGATIVE   Ketones, ur NEGATIVE NEGATIVE mg/dL   Protein, ur NEGATIVE NEGATIVE mg/dL   Urobilinogen, UA 0.2 0.0 - 1.0 mg/dL   Nitrite NEGATIVE NEGATIVE   Leukocytes, UA  NEGATIVE NEGATIVE  Urine microscopic-add on     Status: None   Collection Time: 07/29/14  9:16 PM  Result Value Ref Range   Squamous Epithelial / LPF RARE RARE   WBC, UA 0-2 <3 WBC/hpf   RBC / HPF 3-6 <3 RBC/hpf   Bacteria, UA RARE RARE  Pregnancy, urine POC     Status: None   Collection Time: 07/29/14  9:28 PM  Result Value Ref Range   Preg Test, Ur NEGATIVE NEGATIVE  CBC     Status: Abnormal   Collection Time: 07/29/14 10:40 PM  Result Value Ref Range   WBC 6.5 4.0 - 10.5 K/uL   RBC 4.27 3.87 - 5.11 MIL/uL   Hemoglobin 12.5 12.0 - 15.0 g/dL   HCT 35.6 (L) 36.0 - 46.0 %   MCV 83.4 78.0 - 100.0 fL  MCH 29.3 26.0 - 34.0 pg   MCHC 35.1 30.0 - 36.0 g/dL   RDW 12.7 11.5 - 15.5 %   Platelets 278 150 - 400 K/uL   Physical Exam   Blood pressure 127/86, pulse 108, temperature 99.3 F (37.4 C), temperature source Oral, resp. rate 16, height 5\' 7"  (1.702 m), weight 74.39 kg (164 lb), SpO2 98 %.  Physical Exam  Constitutional: She is oriented to person, place, and time. She appears well-developed and well-nourished.  HENT:  Head: Normocephalic and atraumatic.  Eyes: Pupils are equal, round, and reactive to light.  Neck: Normal range of motion.  Cardiovascular: Normal rate, regular rhythm, normal heart sounds and intact distal pulses.   Respiratory: Effort normal and breath sounds normal.  GI: Soft. Bowel sounds are normal.  Soft, non-tender  Genitourinary:  SSE: Small amount of bright, red blood noted in vaginal vault; small amount of dried, red blood with a few small clots on perineum; no active bleeding from cervix; VE: no palpable masses, no CMT, cervix closed, firm, posterior  Musculoskeletal: Normal range of motion.  Neurological: She is alert and oriented to person, place, and time.  Skin: Skin is warm and dry.  Psychiatric: She has a normal mood and affect. Her behavior is normal. Judgment and thought content normal.    MAU Course  Procedures CCUA, UPT, CBC, Orthostatic  VS  Assessment and Plan  33 yo G2P2002 nonpregnant Menorrhagia Endometriosis  Discharge Home Take ibuprofen 800 mg tomorrow (8 hrs after administration in MAU) Keep scheduled appointment with Gustavo Lah, NP tomorrow Rx: Ultram 50 mg every 6 hours for pain Call the office, if symptoms worsen or not improved F/U with Dr. Garwin Brothers prn  Graceann Congress, MSN, CNM 07/30/2014, 12:45 AM

## 2014-07-30 NOTE — Discharge Instructions (Signed)
Abnormal Uterine Bleeding °Abnormal uterine bleeding can affect women at various stages in life, including teenagers, women in their reproductive years, pregnant women, and women who have reached menopause. Several kinds of uterine bleeding are considered abnormal, including: °· Bleeding or spotting between periods.   °· Bleeding after sexual intercourse.   °· Bleeding that is heavier or more than normal.   °· Periods that last longer than usual. °· Bleeding after menopause.   °Many cases of abnormal uterine bleeding are minor and simple to treat, while others are more serious. Any type of abnormal bleeding should be evaluated by your health care provider. Treatment will depend on the cause of the bleeding. °HOME CARE INSTRUCTIONS °Monitor your condition for any changes. The following actions may help to alleviate any discomfort you are experiencing: °· Avoid the use of tampons and douches as directed by your health care provider. °· Change your pads frequently. °You should get regular pelvic exams and Pap tests. Keep all follow-up appointments for diagnostic tests as directed by your health care provider.  °SEEK MEDICAL CARE IF:  °· Your bleeding lasts more than 1 week.   °· You feel dizzy at times.   °SEEK IMMEDIATE MEDICAL CARE IF:  °· You pass out.   °· You are changing pads every 15 to 30 minutes.   °· You have abdominal pain. °· You have a fever.   °· You become sweaty or weak.   °· You are passing large blood clots from the vagina.   °· You start to feel nauseous and vomit. °MAKE SURE YOU:  °· Understand these instructions. °· Will watch your condition. °· Will get help right away if you are not doing well or get worse. °Document Released: 09/10/2005 Document Revised: 09/15/2013 Document Reviewed: 04/09/2013 °[employer]® Patient Information ©2015 [employer]. This information is not intended to replace advice given to you by your health care provider. Make sure you discuss any questions you have with your  health care provider. ° °Menorrhagia °Menorrhagia is the medical term for when your menstrual periods are heavy or last longer than usual. With menorrhagia, every period you have may cause enough blood loss and cramping that you are unable to maintain your usual activities. °CAUSES  °In some cases, the cause of heavy periods is unknown, but a number of conditions may cause menorrhagia. Common causes include: °· A problem with the hormone-producing thyroid gland (hypothyroid). °· Noncancerous growths in the uterus (polyps or fibroids). °· An imbalance of the estrogen and progesterone hormones. °· One of your ovaries not releasing an egg during one or more months. °· Side effects of having an intrauterine device (IUD). °· Side effects of some medicines, such as anti-inflammatory medicines or blood thinners. °· A bleeding disorder that stops your blood from clotting normally. °SIGNS AND SYMPTOMS  °During a normal period, bleeding lasts between 4 and 8 days. Signs that your periods are too heavy include: °· You routinely have to change your pad or tampon every 1 or 2 hours because it is completely soaked. °· You pass blood clots larger than 1 inch (2.5 cm) in size. °· You have bleeding for more than 7 days. °· You need to use pads and tampons at the same time because of heavy bleeding. °· You need to wake up to change your pads or tampons during the night. °· You have symptoms of anemia, such as tiredness, fatigue, or shortness of breath.  °DIAGNOSIS  °Your health care provider will perform a physical exam and ask you questions about your symptoms and menstrual history. Other   tests may be ordered based on what the health care provider finds during the exam. These tests can include: °· Blood tests. Blood tests are used to check if you are pregnant or have hormonal changes, a bleeding or thyroid disorder, low iron levels (anemia), or other problems. °· Endometrial biopsy. Your health care provider takes a sample of tissue  from the inside of your uterus to be examined under a microscope. °· Pelvic ultrasound. This test uses sound waves to make a picture of your uterus, ovaries, and vagina. The pictures can show if you have fibroids or other growths. °· Hysteroscopy. For this test, your health care provider will use a small telescope to look inside your uterus. °Based on the results of your initial tests, your health care provider may recommend further testing. °TREATMENT  °Treatment may not be needed. If it is needed, your health care provider may recommend treatment with one or more medicines first. If these do not reduce bleeding enough, a surgical treatment might be an option. The best treatment for you will depend on:  °· Whether you need to prevent pregnancy.   °· Your desire to have children in the future. °· The cause and severity of your bleeding. °· Your opinion and personal preference.   °Medicines for menorrhagia may include: °· Birth control methods that use hormones. These include the pill, skin patch, vaginal ring, shots that you get every 3 months, hormonal IUD, and implant. These treatments reduce bleeding during your menstrual period. °· Medicines that thicken blood and slow bleeding. °· Medicines that reduce swelling, such as ibuprofen.  °· Medicines that contain a synthetic hormone called progestin.   °· Medicines that make the ovaries stop working for a short time.   °You may need surgical treatment for menorrhagia if the medicines are unsuccessful. Treatment options include: °· Dilation and curettage (D&C). In this procedure, your health care provider opens (dilates) your cervix and then scrapes or suctions tissue from the lining of your uterus to reduce menstrual bleeding. °· Operative hysteroscopy. This procedure uses a tiny tube with a light (hysteroscope) to view your uterine cavity and can help in the surgical removal of a polyp that may be causing heavy periods. °· Endometrial ablation. Through various  techniques, your health care provider permanently destroys the entire lining of your uterus (endometrium). After endometrial ablation, most women have little or no menstrual flow. Endometrial ablation reduces your ability to become pregnant. °· Endometrial resection. This surgical procedure uses an electrosurgical wire loop to remove the lining of the uterus. This procedure also reduces your ability to become pregnant. °· Hysterectomy. Surgical removal of the uterus and cervix is a permanent procedure that stops menstrual periods. Pregnancy is not possible after a hysterectomy. This procedure requires anesthesia and hospitalization. °HOME CARE INSTRUCTIONS  °· Only take over-the-counter or prescription medicines as directed by your health care provider. Take prescribed medicines exactly as directed. Do not change or switch medicines without consulting your health care provider. °· Take any prescribed iron pills exactly as directed by your health care provider. Long-term heavy bleeding may result in low iron levels. Iron pills help replace the iron your body lost from heavy bleeding. Iron may cause constipation. If this becomes a problem, increase the bran, fruits, and roughage in your diet. °· Do not take aspirin or medicines that contain aspirin 1 week before or during your menstrual period. Aspirin may make the bleeding worse. °· If you need to change your sanitary pad or tampon more than once every 2 hours,   stay in bed and rest as much as possible until the bleeding stops. °· Eat well-balanced meals. Eat foods high in iron. Examples are leafy green vegetables, meat, liver, eggs, and whole grain breads and cereals. Do not try to lose weight until the abnormal bleeding has stopped and your blood iron level is back to normal. °SEEK MEDICAL CARE IF:  °· You soak through a pad or tampon every 1 or 2 hours, and this happens every time you have a period. °· You need to use pads and tampons at the same time because you  are bleeding so much. °· You need to change your pad or tampon during the night. °· You have a period that lasts for more than 8 days. °· You pass clots bigger than 1 inch wide. °· You have irregular periods that happen more or less often than once a month. °· You feel dizzy or faint. °· You feel very weak or tired. °· You feel short of breath or feel your heart is beating too fast when you exercise. °· You have nausea and vomiting or diarrhea while you are taking your medicine. °· You have any problems that may be related to the medicine you are taking. °SEEK IMMEDIATE MEDICAL CARE IF:  °· You soak through 4 or more pads or tampons in 2 hours. °· You have any bleeding while you are pregnant. °MAKE SURE YOU:  °· Understand these instructions. °· Will watch your condition. °· Will get help right away if you are not doing well or get worse. °Document Released: 09/10/2005 Document Revised: 09/15/2013 Document Reviewed: 03/01/2013 °[employer]® Patient Information ©2015 [employer]. This information is not intended to replace advice given to you by your health care provider. Make sure you discuss any questions you have with your health care provider. ° °

## 2014-08-12 ENCOUNTER — Encounter (HOSPITAL_BASED_OUTPATIENT_CLINIC_OR_DEPARTMENT_OTHER): Payer: Self-pay | Admitting: Emergency Medicine

## 2014-08-12 ENCOUNTER — Emergency Department (HOSPITAL_BASED_OUTPATIENT_CLINIC_OR_DEPARTMENT_OTHER)
Admission: EM | Admit: 2014-08-12 | Discharge: 2014-08-13 | Disposition: A | Discharge status: Home / Self Care | Payer: 59 | Attending: Emergency Medicine | Admitting: Emergency Medicine

## 2014-08-12 ENCOUNTER — Emergency Department (HOSPITAL_BASED_OUTPATIENT_CLINIC_OR_DEPARTMENT_OTHER): Payer: 59

## 2014-08-12 DIAGNOSIS — F419 Anxiety disorder, unspecified: Secondary | ICD-10-CM | POA: Diagnosis not present

## 2014-08-12 DIAGNOSIS — R079 Chest pain, unspecified: Secondary | ICD-10-CM | POA: Diagnosis not present

## 2014-08-12 DIAGNOSIS — Z8719 Personal history of other diseases of the digestive system: Secondary | ICD-10-CM | POA: Diagnosis not present

## 2014-08-12 DIAGNOSIS — Z8742 Personal history of other diseases of the female genital tract: Secondary | ICD-10-CM | POA: Insufficient documentation

## 2014-08-12 DIAGNOSIS — E876 Hypokalemia: Secondary | ICD-10-CM | POA: Diagnosis not present

## 2014-08-12 DIAGNOSIS — R0602 Shortness of breath: Secondary | ICD-10-CM | POA: Insufficient documentation

## 2014-08-12 DIAGNOSIS — Z8669 Personal history of other diseases of the nervous system and sense organs: Secondary | ICD-10-CM | POA: Insufficient documentation

## 2014-08-12 DIAGNOSIS — R011 Cardiac murmur, unspecified: Secondary | ICD-10-CM | POA: Insufficient documentation

## 2014-08-12 DIAGNOSIS — R51 Headache: Secondary | ICD-10-CM | POA: Diagnosis not present

## 2014-08-12 DIAGNOSIS — Z8701 Personal history of pneumonia (recurrent): Secondary | ICD-10-CM | POA: Insufficient documentation

## 2014-08-12 DIAGNOSIS — Z8679 Personal history of other diseases of the circulatory system: Secondary | ICD-10-CM | POA: Diagnosis not present

## 2014-08-12 LAB — CBC WITH DIFFERENTIAL/PLATELET
Basophils Absolute: 0 10*3/uL (ref 0.0–0.1)
Basophils Relative: 0 % (ref 0–1)
Eosinophils Absolute: 0.1 10*3/uL (ref 0.0–0.7)
Eosinophils Relative: 2 % (ref 0–5)
HCT: 34.8 % — ABNORMAL LOW (ref 36.0–46.0)
Hemoglobin: 12 g/dL (ref 12.0–15.0)
Lymphocytes Relative: 23 % (ref 12–46)
Lymphs Abs: 1.8 10*3/uL (ref 0.7–4.0)
MCH: 29.1 pg (ref 26.0–34.0)
MCHC: 34.5 g/dL (ref 30.0–36.0)
MCV: 84.5 fL (ref 78.0–100.0)
Monocytes Absolute: 0.4 10*3/uL (ref 0.1–1.0)
Monocytes Relative: 5 % (ref 3–12)
Neutro Abs: 5.5 10*3/uL (ref 1.7–7.7)
Neutrophils Relative %: 70 % (ref 43–77)
Platelets: 255 10*3/uL (ref 150–400)
RBC: 4.12 MIL/uL (ref 3.87–5.11)
RDW: 12.5 % (ref 11.5–15.5)
WBC: 7.9 10*3/uL (ref 4.0–10.5)

## 2014-08-12 LAB — BASIC METABOLIC PANEL
Anion gap: 14 (ref 5–15)
BUN: 12 mg/dL (ref 6–23)
CO2: 24 mEq/L (ref 19–32)
Calcium: 8.3 mg/dL — ABNORMAL LOW (ref 8.4–10.5)
Chloride: 102 mEq/L (ref 96–112)
Creatinine, Ser: 0.8 mg/dL (ref 0.50–1.10)
GFR calc Af Amer: >90 mL/min (ref >90)
GFR calc non Af Amer: >90 mL/min (ref >90)
Glucose, Bld: 86 mg/dL (ref 70–99)
Potassium: 3 mEq/L — ABNORMAL LOW (ref 3.7–5.3)
Sodium: 140 mEq/L (ref 137–147)

## 2014-08-12 LAB — TROPONIN I: Troponin I: <0.3 ng/mL (ref <0.30)

## 2014-08-12 MED ORDER — POTASSIUM CHLORIDE 20 MEQ/15ML (10%) PO SOLN
40.0000 meq | Freq: Once | ORAL | Status: AC
  Administered 2014-08-13: 40 meq via ORAL
  Filled 2014-08-12: qty 30

## 2014-08-12 MED ORDER — ACETAMINOPHEN 325 MG PO TABS
650.0000 mg | ORAL_TABLET | Freq: Once | ORAL | Status: AC
  Administered 2014-08-13: 650 mg via ORAL
  Filled 2014-08-12: qty 2

## 2014-08-12 MED ORDER — POTASSIUM CHLORIDE CRYS ER 20 MEQ PO TBCR: 40.0000 meq | EXTENDED_RELEASE_TABLET | Freq: Once | ORAL | Status: DC

## 2014-08-12 NOTE — ED Provider Notes (Signed)
CSN: [id number]     Arrival date & time 08/12/14  2222 History   None    This chart was scribed for Sharyon Cable, MD by Forrestine Him, ED Scribe. This patient was seen in room MH02/MH02 and the patient's care was started 11:57 PM.   Chief Complaint  Patient presents with  . Chest Pain   Patient is a 33 y.o. female presenting with chest pain. The history is provided by the patient. No language interpreter was used.  Chest Pain Pain quality: sharp   Pain radiates to the back: no   Pain severity:  Moderate Timing:  Intermittent Chronicity:  New Relieved by:  Nothing Worsened by:  Nothing tried Associated symptoms: headache and shortness of breath   Associated symptoms: no abdominal pain, no fever and not vomiting   Risk factors: no coronary artery disease and no prior DVT/PE     HPI Comments: Diane [name] is a 33 y.o. female  who presents to the Emergency Department complaining of intermittent, moderate, non-radiating midsternal chest pain onset 8:30 PM this evening while watching TV. She describes pain as sharp with episodes lasting 1-5 minutes. No alleviating or aggravating factors at this time. Pt also reports mild SOB, lightheadedness, and HA. Diane [name] reports a similar episode 5 days ago along with a panic attack which resolved after a period of time. Pt denies any nausea, fever, worsening leg swelling, abnominal pain, or vomiting. Pt takes Pirmella, Prozac, and Adderall daily. No known allergies to medications.   Past Medical History  Diagnosis Date  . Crohn's disease   . PONV (postoperative nausea and vomiting)   . Pneumonia     history back in 2006  . Heart murmur     as child, never had any problems  . Anxiety   . Seasonal allergies   . Headache(784.0)     otc meds - last migraine 2009  . Low blood potassium     history  . Crohn's disease     history  . Anemia   . History of endometriosis   . Migraine without aura, with intractable migraine, so stated,  without mention of status migrainosus 06/19/2013  . Vasovagal syncope    Past Surgical History  Procedure Laterality Date  . Tonsillectomy    . Adenoidectomy    . Dilatation & currettage/hysteroscopy with resectocope N/A 06/09/2013    Procedure: DILATATION & CURETTAGE/HYSTEROSCOPY WITH HYSTEROSCOPIC RESECTION OF SUBMUCOSAL FIBROID AND ENDOMETRIAL POLYP;  Surgeon: Marvene Staff, MD;  Location: [facility];  Service: Gynecology;  Laterality: N/A;  . Laparoscopy N/A 06/09/2013    Procedure: LAPAROSCOPY DIAGNOSTIC;  Surgeon: Marvene Staff, MD;  Location: [facility];  Service: Gynecology;  Laterality: N/A;  . Robotic assisted laparoscopic lysis of adhesion N/A 06/09/2013    Procedure: ROBOTIC ASSISTED LAPAROSCOPIC LYSIS OF ADHESION; Resection of Endometriosis and excision of fibroid;  Surgeon: Marvene Staff, MD;  Location: [facility];  Service: Gynecology;  Laterality: N/A;   Family History  Problem Relation Age of Onset  . Heart disease Mother 42    CHF  . Crohn's disease Mother   . Asthma Brother    History  Substance Use Topics  . Smoking status: Never Smoker   . Smokeless tobacco: Never Used  . Alcohol Use: No   OB History    Gravida Para Term Preterm AB TAB SAB Ectopic Multiple Living   2 0 0       2     Review of Systems  Constitutional: Negative for fever.  Respiratory: Positive for shortness of breath.   Cardiovascular: Negative for chest pain and leg swelling.  Gastrointestinal: Negative for vomiting, abdominal pain and diarrhea.  Neurological: Positive for light-headedness and headaches.  All other systems reviewed and are negative.     Allergies  Review of patient's allergies indicates no known allergies.  Home Medications   Prior to Admission medications   Medication Sig Start Date End Date Taking? Authorizing Provider  amphetamine-dextroamphetamine (ADDERALL) 30 MG tablet Take 30 mg by mouth daily. 01/04/14   Historical Provider, MD  FLUoxetine (PROZAC)  20 MG capsule Take 20 mg by mouth daily.    Historical Provider, MD  FOLIC ACID PO Take by mouth.    Historical Provider, MD  [facility] 1/35 tablet Take 1 tablet by mouth daily. 10/16/13   Historical Provider, MD   Triage Vitals: BP 160/93 mmHg  Pulse 78  Temp(Src) 99.7 F (37.6 C) (Oral)  Resp 18  Ht 5\' 7"  (1.702 m)  Wt 166 lb (75.297 kg)  BMI 25.99 kg/m2  SpO2 99%   Physical Exam  CONSTITUTIONAL: Well developed/well nourished HEAD: Normocephalic/atraumatic EYES: EOMI/PERRL ENMT: Mucous membranes moist NECK: supple no meningeal signs SPINE/BACK:entire spine nontender CV: S1/S2 noted, no murmurs/rubs/gallops noted CHEST: Diffuse chest wall tenderness noted. No crepitus or bruising noted LUNGS: Lungs are clear to auscultation bilaterally, no apparent distress ABDOMEN: soft, nontender, no rebound or guarding, bowel sounds noted throughout abdomen GU:no cva tenderness NEURO: Pt is awake/alert/appropriate, moves all extremitiesx4.  No facial droop.   EXTREMITIES: pulses normal/equal, full ROM. No calf tenderness, edema, or erythema  SKIN: warm, color normal  PSYCH: no abnormalities of mood noted, alert and oriented to situation     Procedures  DIAGNOSTIC STUDIES: Oxygen Saturation is 99% on RA, Normal by my interpretation.    COORDINATION OF CARE: 11:57 PM-Discussed treatment plan with pt at bedside and pt agreed to plan.    Patient with onset of CP that is reproducible on exam.  She is well appearing/no distress Her EKG is unchanged She has no hypoxia/tachycardia.  At this point, low suspicion for ACS/PE/Dissection CXR reviewed and is negative All other labs ordered per protocol I don't feel further testing or troponin monitoring is necessary I did recommend PCP followup as she has had these episodes previously and may benefit from cardiology evaluation as outpatient We discussed strict ER return precautions Labs Review Labs Reviewed  CBC WITH DIFFERENTIAL - Abnormal;  Notable for the following:    HCT 34.8 (*)    All other components within normal limits  BASIC METABOLIC PANEL - Abnormal; Notable for the following:    Potassium 3.0 (*)    Calcium 8.3 (*)    All other components within normal limits  TROPONIN I    Imaging Review Dg Chest 2 View  08/12/2014   CLINICAL DATA:  Mid chest pain, sudden onset, shortness of breath, initial encounter  EXAM: CHEST  2 VIEW  COMPARISON:  01/23/2014  FINDINGS: The heart size and mediastinal contours are within normal limits. Both lungs are clear. The visualized skeletal structures are unremarkable.  IMPRESSION: No active cardiopulmonary disease.   Electronically Signed   By: Inez Catalina M.D.   On: 08/12/2014 23:04     EKG Interpretation   Date/Time:  Thursday August 12 2014 22:27:24 EST Ventricular Rate:  81 PR Interval:  154 QRS Duration: 82 QT Interval:  412 QTC Calculation: 478 R Axis:   40 Text Interpretation:  Normal sinus rhythm  Normal ECG No significant change  since last tracing Confirmed by [place]ord  MD, MEGAN [phone]) on 08/12/2014  10:45:47 PM     Medications  acetaminophen (TYLENOL) tablet 650 mg (650 mg Oral Given 08/13/14 0002)  potassium chloride 20 MEQ/15ML (10%) solution 40 mEq (40 mEq Oral Given 08/13/14 0005)    MDM   Final diagnoses:  Chest pain, unspecified chest pain type  Hypokalemia    Nursing notes including past medical history and social history reviewed and considered in documentation xrays/imaging reviewed by myself and considered during evaluation Labs/vital reviewed myself and considered during evaluation Previous records reviewed and considered   I personally performed the services described in this documentation, which was scribed in my presence. The recorded information has been reviewed and is accurate.    Sharyon Cable, MD 08/13/14 [phone]

## 2014-08-12 NOTE — ED Notes (Signed)
C/o midsternal cp onset 2030 w sob  Denies n/v

## 2014-08-12 NOTE — ED Notes (Signed)
Patient transported to CT 

## 2014-08-12 NOTE — ED Notes (Signed)
Pt states she started having CP around 830 tonight.  Has had this before states they told her they don't know the problem EKG looked good.

## 2014-08-13 NOTE — Discharge Instructions (Signed)

## 2014-08-16 ENCOUNTER — Encounter (HOSPITAL_COMMUNITY): Payer: Self-pay | Admitting: Emergency Medicine

## 2014-08-16 ENCOUNTER — Telehealth: Payer: Self-pay | Admitting: Family Medicine

## 2014-08-16 ENCOUNTER — Emergency Department (HOSPITAL_COMMUNITY)
Admission: EM | Admit: 2014-08-16 | Discharge: 2014-08-16 | Disposition: A | Discharge status: Home / Self Care | Payer: 59 | Source: Home / Self Care | Attending: Emergency Medicine | Admitting: Emergency Medicine

## 2014-08-16 ENCOUNTER — Ambulatory Visit (HOSPITAL_COMMUNITY): Payer: 59 | Attending: Emergency Medicine

## 2014-08-16 ENCOUNTER — Ambulatory Visit: Payer: 59 | Admitting: Family Medicine

## 2014-08-16 DIAGNOSIS — R05 Cough: Secondary | ICD-10-CM

## 2014-08-16 DIAGNOSIS — J189 Pneumonia, unspecified organism: Secondary | ICD-10-CM

## 2014-08-16 DIAGNOSIS — R059 Cough, unspecified: Secondary | ICD-10-CM

## 2014-08-16 MED ORDER — AMOXICILLIN-POT CLAVULANATE 875-125 MG PO TABS: 1.0000 | ORAL_TABLET | Freq: Two times a day (BID) | ORAL | Status: DC

## 2014-08-16 MED ORDER — PREDNISONE 20 MG PO TABS: 20.0000 mg | ORAL_TABLET | Freq: Two times a day (BID) | ORAL | Status: DC

## 2014-08-16 MED ORDER — AZITHROMYCIN 250 MG PO TABS: ORAL_TABLET | ORAL | Status: DC

## 2014-08-16 MED ORDER — LIDOCAINE HCL (PF) 1 % IJ SOLN
INTRAMUSCULAR | Status: AC
  Filled 2014-08-16: qty 5

## 2014-08-16 MED ORDER — HYDROCOD POLST-CHLORPHEN POLST 10-8 MG/5ML PO LQCR: 5.0000 mL | Freq: Two times a day (BID) | ORAL | Status: DC | PRN

## 2014-08-16 MED ORDER — FLUCONAZOLE 150 MG PO TABS: 150.0000 mg | ORAL_TABLET | Freq: Once | ORAL | Status: DC

## 2014-08-16 MED ORDER — HYDROCODONE-ACETAMINOPHEN 5-325 MG PO TABS: ORAL_TABLET | ORAL | Status: DC

## 2014-08-16 MED ORDER — CEFTRIAXONE SODIUM 1 G IJ SOLR
1.0000 g | Freq: Once | INTRAMUSCULAR | Status: AC
  Administered 2014-08-16: 1 g via INTRAMUSCULAR

## 2014-08-16 MED ORDER — CEFTRIAXONE SODIUM 1 G IJ SOLR
INTRAMUSCULAR | Status: AC
  Filled 2014-08-16: qty 10

## 2014-08-16 NOTE — ED Notes (Signed)
Patient sent to [facility]

## 2014-08-16 NOTE — ED Notes (Signed)
Provided ice chips, declined any beverage suggestion.

## 2014-08-16 NOTE — Telephone Encounter (Signed)
Pt was on schedule to see Dr. Sarajane Jews today. I tried to call pt and the mail box was full.

## 2014-08-16 NOTE — ED Provider Notes (Signed)
Chief Complaint   Cough   History of Present Illness   Diane [name] is a 33 year old female who has had a 2 day history of cough productive of green sputum with streaks of blood, wheezing, chest tightness, and chest pain. She denies any history of asthma and is not a cigarette smoker. She was seen 4 days ago at the emergency department for chest pain, although she did not have a cough or upper respiratory symptoms at that time. She had a chest x-ray which was read as normal. Subsequently she developed chills, sore throat, rhinorrhea, headache, abdominal pain. The patient states she has had pneumonia twice in the past.  Review of Systems   Other than as noted above, the patient denies any of the following symptoms: Systemic:  No fevers, chills, sweats, or myalgias. Eye:  No redness or discharge. ENT:  No ear pain, headache, nasal congestion, drainage, sinus pressure, or sore throat. Neck:  No neck pain, stiffness, or swollen glands. Lungs:  No cough, sputum production, hemoptysis, wheezing, chest tightness, shortness of breath or chest pain. GI:  No abdominal pain, nausea, vomiting or diarrhea.  Bells   Past medical history, family history, social history, meds, and allergies were reviewed. She takes Prozac and Adderall. She has a history of Crohn's disease.  Physical exam   Vital signs:  BP 135/98 mmHg  Pulse 102  Temp(Src) 98.5 F (36.9 C) (Oral)  Resp 16  SpO2 100% General:  Alert and oriented.  In no distress.  Skin warm and dry. Eye:  No conjunctival injection or drainage. Lids were normal. ENT:  TMs and canals were normal, without erythema or inflammation.  Nasal mucosa was clear and uncongested, without drainage.  Mucous membranes were moist.  Pharynx was clear with no exudate or drainage.  There were no oral ulcerations or lesions. Neck:  Supple, no adenopathy, tenderness or mass. Lungs:  No respiratory distress.  Lungs were clear to auscultation, without wheezes, rales  or rhonchi.  Breath sounds were clear and equal bilaterally.  Heart:  Regular rhythm, without gallops, murmers or rubs. Skin:  Clear, warm, and dry, without rash or lesions.    Radiology   Ct Abdomen Pelvis Wo Contrast  07/22/2014   CLINICAL DATA:  Left flank pain and hematuria.  EXAM: CT ABDOMEN AND PELVIS WITHOUT CONTRAST  TECHNIQUE: Multidetector CT imaging of the abdomen and pelvis was performed following the standard protocol without IV contrast.  COMPARISON:  05/03/2005  FINDINGS: BODY WALL: Unremarkable.  LOWER CHEST: Unremarkable.  ABDOMEN/PELVIS:  Liver: No focal abnormality.  Biliary: No evidence of biliary obstruction or stone.  Pancreas: Unremarkable.  Spleen: Unremarkable.  Adrenals: Unremarkable.  Kidneys and ureters: No hydronephrosis or stone. Probable sub cm cyst in the lower pole left kidney.  Bladder: Unremarkable.  Reproductive: There is a rounded area of high density in the fundic uterus which measures 3 cm in diameter. Suspect fibroid.  Bowel: No obstruction. No acute appendicitis.  Retroperitoneum: No mass or adenopathy.  Peritoneum: Trace pelvic fluid, usually physiologic.  Vascular: No acute abnormality.  OSSEOUS: No acute abnormalities.  IMPRESSION: 1. No hydronephrosis or urolithiasis. 2. Fibroid uterus.   Electronically Signed   By: Jorje Guild M.D.   On: 07/22/2014 01:15   Dg Chest 2 View  08/16/2014   CLINICAL DATA:  Cough and shortness of breath  EXAM: CHEST  2 VIEW  COMPARISON:  08/12/2014  FINDINGS: Cardiac shadow is within normal limits. Increased density is noted in the left retrocardiac region  projecting in left lower lobe on the lateral projection consistent with pneumonia. No other focal infiltrate is seen. No sizable effusion is seen.  IMPRESSION: Left lower lobe pneumonia   Electronically Signed   By: Inez Catalina M.D.   On: 08/16/2014 14:02   Dg Chest 2 View  08/12/2014   CLINICAL DATA:  Mid chest pain, sudden onset, shortness of breath, initial encounter   EXAM: CHEST  2 VIEW  COMPARISON:  01/23/2014  FINDINGS: The heart size and mediastinal contours are within normal limits. Both lungs are clear. The visualized skeletal structures are unremarkable.  IMPRESSION: No active cardiopulmonary disease.   Electronically Signed   By: Inez Catalina M.D.   On: 08/12/2014 23:04   Course in Urgent [place]   The following medications were given:  Medications  cefTRIAXone (ROCEPHIN) injection 1 g (1 g Intramuscular Given 08/16/14 1446)   Assessment     The primary encounter diagnosis was Community acquired pneumonia. A diagnosis of Cough was also pertinent to this visit.  Plan    1.  Meds:  The following meds were prescribed:   Discharge Medication List as of 08/16/2014  2:32 PM    START taking these medications   Details  amoxicillin-clavulanate (AUGMENTIN) 875-125 MG per tablet Take 1 tablet by mouth 2 (two) times daily., Starting 08/16/2014, Until Discontinued, Normal    azithromycin (ZITHROMAX Z-PAK) 250 MG tablet Take as directed., Normal    chlorpheniramine-HYDROcodone (TUSSIONEX) 10-8 MG/5ML LQCR Take 5 mLs by mouth every 12 (twelve) hours as needed for cough., Starting 08/16/2014, Until Discontinued, Normal    fluconazole (DIFLUCAN) 150 MG tablet Take 1 tablet (150 mg total) by mouth once., Starting 08/16/2014, Normal    HYDROcodone-acetaminophen (NORCO/VICODIN) 5-325 MG per tablet 1 to 2 tabs every 4 to 6 hours as needed for pain., Print    predniSONE (DELTASONE) 20 MG tablet Take 1 tablet (20 mg total) by mouth 2 (two) times daily., Starting 08/16/2014, Until Discontinued, Normal        2.  Patient Education/Counseling:  The patient was given appropriate handouts, self care instructions, and instructed in symptomatic relief.  Instructed to get extra fluids and extra rest.    3.  Follow up:  The patient was told to follow up here in 48 hours, and also suggest repeat chest x-ray in one month to document clearing, or sooner if  becoming worse in any way, and given some red flag symptoms such as increasing fever, difficulty breathing, chest pain, or persistent vomiting which would prompt immediate return.       Harden Mo, MD 08/16/14 [phone]

## 2014-08-16 NOTE — ED Notes (Deleted)
Reports taking last blood pressure pill yesterday and presents today for refill. No pcp

## 2014-08-16 NOTE — ED Notes (Signed)
Patient aware of post injection delay prior to discharge.   

## 2014-08-16 NOTE — Discharge Instructions (Signed)

## 2014-08-16 NOTE — ED Notes (Signed)
Cough, general malaise, laryngitis, poor appetite, chest and throat hurt with coughing.  Patient's child is being seen in the same treatment room, same provider

## 2014-10-03 ENCOUNTER — Inpatient Hospital Stay (HOSPITAL_COMMUNITY)
Admission: AD | Admit: 2014-10-03 | Discharge: 2014-10-03 | Disposition: A | Discharge status: Home / Self Care | Payer: 59 | Source: Ambulatory Visit | Attending: Obstetrics and Gynecology | Admitting: Obstetrics and Gynecology

## 2014-10-03 ENCOUNTER — Encounter (HOSPITAL_COMMUNITY): Payer: Self-pay | Admitting: *Deleted

## 2014-10-03 DIAGNOSIS — N949 Unspecified condition associated with female genital organs and menstrual cycle: Secondary | ICD-10-CM | POA: Insufficient documentation

## 2014-10-03 DIAGNOSIS — R102 Pelvic and perineal pain: Secondary | ICD-10-CM

## 2014-10-03 DIAGNOSIS — G8929 Other chronic pain: Secondary | ICD-10-CM | POA: Diagnosis not present

## 2014-10-03 DIAGNOSIS — K5090 Crohn's disease, unspecified, without complications: Secondary | ICD-10-CM | POA: Insufficient documentation

## 2014-10-03 HISTORY — DX: Benign neoplasm of connective and other soft tissue, unspecified: D21.9

## 2014-10-03 HISTORY — DX: Unspecified abnormal cytological findings in specimens from vagina: R87.629

## 2014-10-03 LAB — CBC
HCT: 34.8 % — ABNORMAL LOW (ref 36.0–46.0)
Hemoglobin: 11.9 g/dL — ABNORMAL LOW (ref 12.0–15.0)
MCH: 29 pg (ref 26.0–34.0)
MCHC: 34.2 g/dL (ref 30.0–36.0)
MCV: 84.7 fL (ref 78.0–100.0)
Platelets: 219 10*3/uL (ref 150–400)
RBC: 4.11 MIL/uL (ref 3.87–5.11)
RDW: 12.9 % (ref 11.5–15.5)
WBC: 5 10*3/uL (ref 4.0–10.5)

## 2014-10-03 LAB — URINALYSIS, ROUTINE W REFLEX MICROSCOPIC
Glucose, UA: NEGATIVE mg/dL
Hgb urine dipstick: NEGATIVE
Ketones, ur: 15 mg/dL — AB
Leukocytes, UA: NEGATIVE
Nitrite: NEGATIVE
Protein, ur: NEGATIVE mg/dL
Specific Gravity, Urine: 1.025 (ref 1.005–1.030)
Urobilinogen, UA: 2 mg/dL — ABNORMAL HIGH (ref 0.0–1.0)
pH: 7 (ref 5.0–8.0)

## 2014-10-03 LAB — POCT PREGNANCY, URINE: Preg Test, Ur: NEGATIVE

## 2014-10-03 MED ORDER — KETOROLAC TROMETHAMINE 60 MG/2ML IM SOLN
60.0000 mg | Freq: Once | INTRAMUSCULAR | Status: AC
  Administered 2014-10-03: 60 mg via INTRAMUSCULAR
  Filled 2014-10-03: qty 2

## 2014-10-03 MED ORDER — HYDROCODONE-ACETAMINOPHEN 5-325 MG PO TABS: 1.0000 | ORAL_TABLET | Freq: Four times a day (QID) | ORAL | Status: DC | PRN

## 2014-10-03 NOTE — MAU Provider Note (Signed)
CSN: [id number]     Arrival date & time 10/03/14  1207 History   None    Chief Complaint  Patient presents with  . Pelvic Pain     (Consider location/radiation/quality/duration/timing/severity/associated sxs/prior Treatment) Patient is a 34 y.o. female presenting with pelvic pain. The history is provided by the patient.  Pelvic Pain This is a new problem. The current episode started today. The problem occurs constantly. The problem has been gradually worsening. Associated symptoms include abdominal pain and nausea. Pertinent negatives include no chest pain, chills, coughing, fever, headaches, neck pain, rash or vomiting.   Diane [name] is a 34 y.o. female who presents to the ED with pelvic pain that started about 8 this morning. She describes the pain as sharp, stabbing and rates the pain as 10/10 at its worst and 7/10 currently. She has taken ibuprofen with minimal relief. She denies n/v, fever, chills or UTI symptoms. She has had similar pain in the past, about 2 weeks ago had an ultrasound for same type pain and there was one small fibroid identified. Dr. Ulanda Edison discussed results with the patient and did not feel that the fibroid was the reason for the pain. LMP 12/27 - 1/4. Hx of endometriosis for which she had surgery 06/09/2013 prior to changing her care over to Dr. Ulanda Edison 08/2014.    Past Medical History  Diagnosis Date  . Crohn's disease   . PONV (postoperative nausea and vomiting)   . Pneumonia     history back in 2006  . Heart murmur     as child, never had any problems  . Anxiety   . Seasonal allergies   . Headache(784.0)     otc meds - last migraine 2009  . Low blood potassium     history  . Crohn's disease     history  . Anemia   . History of endometriosis   . Migraine without aura, with intractable migraine, so stated, without mention of status migrainosus 06/19/2013  . Vasovagal syncope   . Fibroid   . Endometriosis   . Vaginal Pap smear, abnormal    Past  Surgical History  Procedure Laterality Date  . Tonsillectomy    . Adenoidectomy    . Dilatation & currettage/hysteroscopy with resectocope N/A 06/09/2013    Procedure: DILATATION & CURETTAGE/HYSTEROSCOPY WITH HYSTEROSCOPIC RESECTION OF SUBMUCOSAL FIBROID AND ENDOMETRIAL POLYP;  Surgeon: Marvene Staff, MD;  Location: [facility];  Service: Gynecology;  Laterality: N/A;  . Laparoscopy N/A 06/09/2013    Procedure: LAPAROSCOPY DIAGNOSTIC;  Surgeon: Marvene Staff, MD;  Location: [facility];  Service: Gynecology;  Laterality: N/A;  . Robotic assisted laparoscopic lysis of adhesion N/A 06/09/2013    Procedure: ROBOTIC ASSISTED LAPAROSCOPIC LYSIS OF ADHESION; Resection of Endometriosis and excision of fibroid;  Surgeon: Marvene Staff, MD;  Location: [facility];  Service: Gynecology;  Laterality: N/A;   Family History  Problem Relation Age of Onset  . Heart disease Mother 21    CHF  . Crohn's disease Mother   . Asthma Mother   . Asthma Brother   . Heart disease Maternal Aunt   . Diabetes Maternal Aunt   . Stroke Maternal Aunt   . Heart disease Maternal Grandfather   . Stroke Maternal Grandfather    History  Substance Use Topics  . Smoking status: Never Smoker   . Smokeless tobacco: Never Used  . Alcohol Use: No   OB History    Gravida Para Term Preterm AB TAB SAB Ectopic  Multiple Living   2 2 2       2      Review of Systems  Constitutional: Negative for fever and chills.  HENT: Negative.   Eyes: Negative for photophobia, pain and visual disturbance.  Respiratory: Negative for cough and wheezing.   Cardiovascular: Negative for chest pain, palpitations and leg swelling.  Gastrointestinal: Positive for nausea and abdominal pain. Negative for vomiting.  Genitourinary: Positive for pelvic pain. Negative for dysuria, urgency, frequency, vaginal bleeding and vaginal discharge.  Musculoskeletal: Negative for back pain and neck pain.  Skin: Negative for rash.  Neurological: Positive  for light-headedness. Negative for syncope and headaches.  Psychiatric/Behavioral: Negative for confusion. The patient is not nervous/anxious.       Allergies  Review of patient's allergies indicates no known allergies.  Home Medications   Prior to Admission medications   Medication Sig Start Date End Date Taking? Authorizing Provider  amphetamine-dextroamphetamine (ADDERALL) 20 MG tablet Take 20 mg by mouth 2 (two) times daily.   Yes Historical Provider, MD  clonazePAM (KLONOPIN) 1 MG tablet Take 0.5 mg by mouth at bedtime.   Yes Historical Provider, MD  FLUoxetine (PROZAC) 20 MG capsule Take 20 mg by mouth 2 (two) times daily.    Yes Historical Provider, MD  ibuprofen (ADVIL,MOTRIN) 200 MG tablet Take 400-800 mg by mouth every 6 (six) hours as needed for mild pain.   Yes Historical Provider, MD  [facility] 1/35 tablet Take 1 tablet by mouth daily. 10/16/13  Yes Historical Provider, MD  amoxicillin-clavulanate (AUGMENTIN) 875-125 MG per tablet Take 1 tablet by mouth 2 (two) times daily. Patient not taking: Reported on 10/03/2014 08/16/14   Harden Mo, MD  azithromycin (ZITHROMAX Z-PAK) 250 MG tablet Take as directed. Patient not taking: Reported on 10/03/2014 08/16/14   Harden Mo, MD  chlorpheniramine-HYDROcodone (TUSSIONEX) 10-8 MG/5ML [facility] Take 5 mLs by mouth every 12 (twelve) hours as needed for cough. Patient not taking: Reported on 10/03/2014 08/16/14   Harden Mo, MD  fluconazole (DIFLUCAN) 150 MG tablet Take 1 tablet (150 mg total) by mouth once. Patient not taking: Reported on 10/03/2014 08/16/14   Harden Mo, MD  HYDROcodone-acetaminophen [facility]) 5-325 MG per tablet Take 1 tablet by mouth every 6 (six) hours as needed for moderate pain. 10/03/14   Hope Bunnie Pion, NP  HYDROcodone-acetaminophen (NORCO/VICODIN) 5-325 MG per tablet 1 to 2 tabs every 4 to 6 hours as needed for pain. Patient not taking: Reported on 10/03/2014 08/16/14   Harden Mo, MD  predniSONE  (DELTASONE) 20 MG tablet Take 1 tablet (20 mg total) by mouth 2 (two) times daily. Patient not taking: Reported on 10/03/2014 08/16/14   Harden Mo, MD   BP 134/89 mmHg  Pulse 91  Temp(Src) 99 F (37.2 C) (Oral)  Resp 18  Wt 162 lb (73.483 kg)  LMP 09/05/2014 Physical Exam  Constitutional: She is oriented to person, place, and time. She appears well-developed and well-nourished.  HENT:  Head: Normocephalic.  Eyes: Conjunctivae and EOM are normal.  Neck: Neck supple.  Cardiovascular: Normal rate.   Pulmonary/Chest: Effort normal.  Abdominal: Soft. There is tenderness.  Genitourinary:  External genitalia without lesions. Positive CMT, right adnexal tenderness.   Musculoskeletal: Normal range of motion.  Neurological: She is alert and oriented to person, place, and time. No cranial nerve deficit.  Skin: Skin is warm and dry.  Psychiatric: She has a normal mood and affect. Her behavior is normal.  Nursing note and  vitals reviewed.   ED Course  Procedures (including critical care time) Labs Review Results for orders placed or performed during the hospital encounter of 10/03/14 (from the past 72 hour(s))  Urinalysis, Routine w reflex microscopic     Status: Abnormal   Collection Time: 10/03/14 12:15 PM  Result Value Ref Range   Color, Urine YELLOW YELLOW   APPearance CLEAR CLEAR   Specific Gravity, Urine 1.025 1.005 - 1.030   pH 7.0 5.0 - 8.0   Glucose, UA NEGATIVE NEGATIVE mg/dL   Hgb urine dipstick NEGATIVE NEGATIVE   Bilirubin Urine SMALL (A) NEGATIVE   Ketones, ur 15 (A) NEGATIVE mg/dL   Protein, ur NEGATIVE NEGATIVE mg/dL   Urobilinogen, UA 2.0 (H) 0.0 - 1.0 mg/dL   Nitrite NEGATIVE NEGATIVE   Leukocytes, UA NEGATIVE NEGATIVE    Comment: MICROSCOPIC NOT DONE ON URINES WITH NEGATIVE PROTEIN, BLOOD, LEUKOCYTES, NITRITE, OR GLUCOSE <1000 mg/dL.  Pregnancy, urine POC     Status: None   Collection Time: 10/03/14 12:36 PM  Result Value Ref Range   Preg Test, Ur NEGATIVE  NEGATIVE    Comment:        THE SENSITIVITY OF THIS METHODOLOGY IS >24 mIU/mL   CBC     Status: Abnormal   Collection Time: 10/03/14  1:07 PM  Result Value Ref Range   WBC 5.0 4.0 - 10.5 K/uL   RBC 4.11 3.87 - 5.11 MIL/uL   Hemoglobin 11.9 (L) 12.0 - 15.0 g/dL   HCT 34.8 (L) 36.0 - 46.0 %   MCV 84.7 78.0 - 100.0 fL   MCH 29.0 26.0 - 34.0 pg   MCHC 34.2 30.0 - 36.0 g/dL   RDW 12.9 11.5 - 15.5 %   Platelets 219 150 - 400 K/uL    @2 :30 pm patient sleeping in exam room after Toradol 60 mg IM.   Discussed with Dr. Ulanda Edison clinical findings and will treat for pain and have the patient follow up in the office.  MDM  34 y.o. female with hx of chronic pelvic pain with acute excerebration today. Pain treated with Toradol and decreased to 5/10. Stable for discharge with normal CBC and without acute abdomen. Discussed with the patient and all questioned fully answered. She will return if any problems arise.   Final diagnoses:  Chronic pelvic pain in female

## 2014-10-03 NOTE — MAU Note (Signed)
Pain started at 0830 this morning.  Pain comes and goes.  Has had pain like this before, unknown cause.

## 2014-10-03 NOTE — Discharge Instructions (Signed)
Take 600 mg of ibuprofen every 6 hours for the next 2 days. Take the pain medication in addition if needed. Do not take the narcotic if driving as it will make you sleepy. Follow up with Dr. Ulanda Edison. Return here as needed.

## 2015-01-31 ENCOUNTER — Encounter (HOSPITAL_COMMUNITY): Payer: Self-pay

## 2015-01-31 ENCOUNTER — Encounter (HOSPITAL_COMMUNITY)
Admission: RE | Admit: 2015-01-31 | Discharge: 2015-01-31 | Disposition: A | Discharge status: Home / Self Care | Payer: 59 | Source: Ambulatory Visit | Attending: Obstetrics and Gynecology | Admitting: Obstetrics and Gynecology

## 2015-01-31 DIAGNOSIS — Z01818 Encounter for other preprocedural examination: Secondary | ICD-10-CM | POA: Diagnosis not present

## 2015-01-31 HISTORY — DX: Personal history of other venous thrombosis and embolism: Z86.718

## 2015-01-31 LAB — BASIC METABOLIC PANEL
Anion gap: 5 (ref 5–15)
BUN: 15 mg/dL (ref 6–20)
CO2: 27 mmol/L (ref 22–32)
Calcium: 8.5 mg/dL — ABNORMAL LOW (ref 8.9–10.3)
Chloride: 107 mmol/L (ref 101–111)
Creatinine, Ser: 0.67 mg/dL (ref 0.44–1.00)
GFR calc Af Amer: >60 mL/min (ref >60)
GFR calc non Af Amer: >60 mL/min (ref >60)
Glucose, Bld: 98 mg/dL (ref 70–99)
Potassium: 3.5 mmol/L (ref 3.5–5.1)
Sodium: 139 mmol/L (ref 135–145)

## 2015-01-31 LAB — CBC
HCT: 35.1 % — ABNORMAL LOW (ref 36.0–46.0)
Hemoglobin: 11.8 g/dL — ABNORMAL LOW (ref 12.0–15.0)
MCH: 28.2 pg (ref 26.0–34.0)
MCHC: 33.6 g/dL (ref 30.0–36.0)
MCV: 83.8 fL (ref 78.0–100.0)
Platelets: 262 10*3/uL (ref 150–400)
RBC: 4.19 MIL/uL (ref 3.87–5.11)
RDW: 13.4 % (ref 11.5–15.5)
WBC: 6.2 10*3/uL (ref 4.0–10.5)

## 2015-01-31 LAB — ABO/RH: ABO/RH(D): A POS

## 2015-01-31 LAB — TYPE AND SCREEN
ABO/RH(D): A POS
Antibody Screen: NEGATIVE

## 2015-01-31 NOTE — Patient Instructions (Addendum)
Your procedure is scheduled on: Feb 09, 2015  Enter through the Main Entrance of [facility] at: 10:30 am   Pick up the phone at the desk and dial [phone].  Call this number if you have problems the morning of surgery: [phone].  Remember: Do NOT eat food: after midnight on Tuesday 02/08/15  Do NOT drink clear liquids after: 0800 day of surgery  Take these medicines the morning of surgery with a SIP OF WATER:  adderall and prozac   Do NOT wear jewelry (body piercing), metal hair clips/bobby pins, make-up, or nail polish. Do NOT wear lotions, powders, or perfumes.  You may wear deoderant. Do NOT shave for 48 hours prior to surgery. Do NOT bring valuables to the hospital. Contacts, dentures, or bridgework may not be worn into surgery. Leave suitcase in car.  After surgery it may be brought to your room.  For patients admitted to the hospital, checkout time is 11:00 AM the day of discharge.

## 2015-02-09 ENCOUNTER — Ambulatory Visit (HOSPITAL_COMMUNITY): Payer: 59 | Admitting: Anesthesiology

## 2015-02-09 ENCOUNTER — Encounter (HOSPITAL_COMMUNITY): Payer: Self-pay | Admitting: *Deleted

## 2015-02-09 ENCOUNTER — Encounter (HOSPITAL_COMMUNITY)
Admission: RE | Disposition: A | Discharge status: Home / Self Care | Payer: Self-pay | Source: Ambulatory Visit | Attending: Obstetrics and Gynecology

## 2015-02-09 ENCOUNTER — Observation Stay (HOSPITAL_COMMUNITY)
Admission: RE | Admit: 2015-02-09 | Discharge: 2015-02-11 | Disposition: A | Discharge status: Home / Self Care | Payer: 59 | Source: Ambulatory Visit | Attending: Obstetrics and Gynecology | Admitting: Obstetrics and Gynecology

## 2015-02-09 DIAGNOSIS — D219 Benign neoplasm of connective and other soft tissue, unspecified: Secondary | ICD-10-CM | POA: Diagnosis present

## 2015-02-09 DIAGNOSIS — K219 Gastro-esophageal reflux disease without esophagitis: Secondary | ICD-10-CM | POA: Diagnosis not present

## 2015-02-09 DIAGNOSIS — F419 Anxiety disorder, unspecified: Secondary | ICD-10-CM | POA: Diagnosis not present

## 2015-02-09 DIAGNOSIS — N801 Endometriosis of ovary: Secondary | ICD-10-CM | POA: Insufficient documentation

## 2015-02-09 DIAGNOSIS — N803 Endometriosis of pelvic peritoneum: Secondary | ICD-10-CM | POA: Diagnosis not present

## 2015-02-09 DIAGNOSIS — D649 Anemia, unspecified: Secondary | ICD-10-CM | POA: Insufficient documentation

## 2015-02-09 DIAGNOSIS — G43909 Migraine, unspecified, not intractable, without status migrainosus: Secondary | ICD-10-CM | POA: Diagnosis not present

## 2015-02-09 DIAGNOSIS — Z79899 Other long term (current) drug therapy: Secondary | ICD-10-CM | POA: Diagnosis not present

## 2015-02-09 DIAGNOSIS — N808 Other endometriosis: Secondary | ICD-10-CM | POA: Insufficient documentation

## 2015-02-09 DIAGNOSIS — E876 Hypokalemia: Secondary | ICD-10-CM | POA: Diagnosis not present

## 2015-02-09 DIAGNOSIS — D259 Leiomyoma of uterus, unspecified: Principal | ICD-10-CM | POA: Insufficient documentation

## 2015-02-09 HISTORY — DX: Benign neoplasm of connective and other soft tissue, unspecified: D21.9

## 2015-02-09 HISTORY — PX: ROBOT ASSISTED MYOMECTOMY: SHX5142

## 2015-02-09 LAB — PREGNANCY, URINE: Preg Test, Ur: NEGATIVE

## 2015-02-09 SURGERY — ROBOTIC ASSISTED MYOMECTOMY
Anesthesia: General | Site: Abdomen

## 2015-02-09 MED ORDER — LIDOCAINE HCL (CARDIAC) 20 MG/ML IV SOLN
INTRAVENOUS | Status: AC
  Filled 2015-02-09: qty 5

## 2015-02-09 MED ORDER — ONDANSETRON HCL 4 MG/2ML IJ SOLN
INTRAMUSCULAR | Status: AC
  Filled 2015-02-09: qty 2

## 2015-02-09 MED ORDER — MIDAZOLAM HCL 2 MG/2ML IJ SOLN
INTRAMUSCULAR | Status: AC
  Filled 2015-02-09: qty 2

## 2015-02-09 MED ORDER — HYDROMORPHONE HCL 1 MG/ML IJ SOLN
0.2000 mg | INTRAMUSCULAR | Status: DC | PRN
  Administered 2015-02-09 – 2015-02-10 (×4): 0.6 mg via INTRAVENOUS
  Filled 2015-02-09 (×4): qty 1

## 2015-02-09 MED ORDER — LACTATED RINGERS IR SOLN
Status: DC | PRN
  Administered 2015-02-09: 3000 mL

## 2015-02-09 MED ORDER — DEXAMETHASONE SODIUM PHOSPHATE 10 MG/ML IJ SOLN
INTRAMUSCULAR | Status: DC | PRN
  Administered 2015-02-09: 4 mg via INTRAVENOUS

## 2015-02-09 MED ORDER — BUPIVACAINE-EPINEPHRINE (PF) 0.25% -1:200000 IJ SOLN
INTRAMUSCULAR | Status: AC
  Filled 2015-02-09: qty 30

## 2015-02-09 MED ORDER — FENTANYL CITRATE (PF) 100 MCG/2ML IJ SOLN
INTRAMUSCULAR | Status: DC | PRN
  Administered 2015-02-09 (×5): 50 ug via INTRAVENOUS
  Administered 2015-02-09: 100 ug via INTRAVENOUS
  Administered 2015-02-09 (×2): 50 ug via INTRAVENOUS

## 2015-02-09 MED ORDER — VASOPRESSIN 20 UNIT/ML IV SOLN
INTRAVENOUS | Status: DC | PRN
  Administered 2015-02-09: 20 mL via INTRAMUSCULAR

## 2015-02-09 MED ORDER — FENTANYL CITRATE (PF) 100 MCG/2ML IJ SOLN
INTRAMUSCULAR | Status: AC
  Filled 2015-02-09: qty 2

## 2015-02-09 MED ORDER — VASOPRESSIN 20 UNIT/ML IV SOLN
INTRAVENOUS | Status: AC
  Filled 2015-02-09: qty 1

## 2015-02-09 MED ORDER — ONDANSETRON HCL 4 MG/2ML IJ SOLN: 4.0000 mg | Freq: Once | INTRAMUSCULAR | Status: DC | PRN

## 2015-02-09 MED ORDER — MEDROXYPROGESTERONE ACETATE 10 MG PO TABS
10.0000 mg | ORAL_TABLET | Freq: Every day | ORAL | Status: DC
  Administered 2015-02-10 – 2015-02-11 (×2): 10 mg via ORAL
  Filled 2015-02-09 (×4): qty 1

## 2015-02-09 MED ORDER — LIDOCAINE HCL (CARDIAC) 20 MG/ML IV SOLN
INTRAVENOUS | Status: DC | PRN
  Administered 2015-02-09: 30 mg via INTRAVENOUS
  Administered 2015-02-09: 70 mg via INTRAVENOUS

## 2015-02-09 MED ORDER — FAMOTIDINE IN NACL 20-0.9 MG/50ML-% IV SOLN
20.0000 mg | Freq: Once | INTRAVENOUS | Status: AC
  Administered 2015-02-09: 20 mg via INTRAVENOUS

## 2015-02-09 MED ORDER — NEOSTIGMINE METHYLSULFATE 10 MG/10ML IV SOLN
INTRAVENOUS | Status: AC
  Filled 2015-02-09: qty 1

## 2015-02-09 MED ORDER — ROCURONIUM BROMIDE 100 MG/10ML IV SOLN
INTRAVENOUS | Status: AC
  Filled 2015-02-09: qty 1

## 2015-02-09 MED ORDER — ONDANSETRON HCL 4 MG/2ML IJ SOLN
INTRAMUSCULAR | Status: DC | PRN
  Administered 2015-02-09: 4 mg via INTRAVENOUS

## 2015-02-09 MED ORDER — PROPOFOL 10 MG/ML IV BOLUS
INTRAVENOUS | Status: AC
  Filled 2015-02-09: qty 20

## 2015-02-09 MED ORDER — ONDANSETRON HCL 4 MG/2ML IJ SOLN
4.0000 mg | Freq: Four times a day (QID) | INTRAMUSCULAR | Status: DC | PRN
  Administered 2015-02-09 – 2015-02-10 (×2): 4 mg via INTRAVENOUS
  Filled 2015-02-09 (×2): qty 2

## 2015-02-09 MED ORDER — FAMOTIDINE IN NACL 20-0.9 MG/50ML-% IV SOLN
INTRAVENOUS | Status: AC
  Administered 2015-02-09: 20 mg via INTRAVENOUS
  Filled 2015-02-09: qty 50

## 2015-02-09 MED ORDER — ONDANSETRON HCL 4 MG PO TABS: 4.0000 mg | ORAL_TABLET | Freq: Four times a day (QID) | ORAL | Status: DC | PRN

## 2015-02-09 MED ORDER — CLONIDINE HCL 0.3 MG PO TABS
0.3000 mg | ORAL_TABLET | Freq: Every day | ORAL | Status: DC
  Administered 2015-02-09: 0.3 mg via ORAL
  Administered 2015-02-10: 0.2 mg via ORAL
  Filled 2015-02-09 (×5): qty 1

## 2015-02-09 MED ORDER — LACTATED RINGERS IV SOLN
INTRAVENOUS | Status: DC
  Administered 2015-02-09 (×2): via INTRAVENOUS

## 2015-02-09 MED ORDER — KETOROLAC TROMETHAMINE 30 MG/ML IJ SOLN
INTRAMUSCULAR | Status: AC
  Filled 2015-02-09: qty 1

## 2015-02-09 MED ORDER — SCOPOLAMINE 1 MG/3DAYS TD PT72
MEDICATED_PATCH | TRANSDERMAL | Status: AC
  Administered 2015-02-09: 1.5 mg via TRANSDERMAL
  Filled 2015-02-09: qty 1

## 2015-02-09 MED ORDER — HEPARIN SODIUM (PORCINE) 5000 UNIT/ML IJ SOLN
INTRAMUSCULAR | Status: AC
  Filled 2015-02-09: qty 3

## 2015-02-09 MED ORDER — HEPARIN SODIUM (PORCINE) 5000 UNIT/ML IJ SOLN
INTRAMUSCULAR | Status: DC | PRN
  Administered 2015-02-09: 5000 [IU] via SUBCUTANEOUS

## 2015-02-09 MED ORDER — TRAMADOL HCL 50 MG PO TABS
50.0000 mg | ORAL_TABLET | Freq: Four times a day (QID) | ORAL | Status: DC | PRN
  Administered 2015-02-09: 50 mg via ORAL
  Filled 2015-02-09: qty 1

## 2015-02-09 MED ORDER — CEFAZOLIN SODIUM-DEXTROSE 2-3 GM-% IV SOLR
INTRAVENOUS | Status: AC
  Filled 2015-02-09: qty 50

## 2015-02-09 MED ORDER — GLYCOPYRROLATE 0.2 MG/ML IJ SOLN
INTRAMUSCULAR | Status: AC
  Filled 2015-02-09: qty 3

## 2015-02-09 MED ORDER — SCOPOLAMINE 1 MG/3DAYS TD PT72
1.0000 | MEDICATED_PATCH | Freq: Once | TRANSDERMAL | Status: DC
  Administered 2015-02-09: 1.5 mg via TRANSDERMAL

## 2015-02-09 MED ORDER — CEFAZOLIN SODIUM-DEXTROSE 2-3 GM-% IV SOLR
2.0000 g | INTRAVENOUS | Status: AC
  Administered 2015-02-09: 2 g via INTRAVENOUS

## 2015-02-09 MED ORDER — METHYLENE BLUE 1 % INJ SOLN
INTRAMUSCULAR | Status: AC
  Filled 2015-02-09: qty 1

## 2015-02-09 MED ORDER — SODIUM CHLORIDE 0.9 % IJ SOLN
INTRAMUSCULAR | Status: AC
  Filled 2015-02-09: qty 50

## 2015-02-09 MED ORDER — ACETAMINOPHEN 10 MG/ML IV SOLN
1000.0000 mg | Freq: Once | INTRAVENOUS | Status: AC
  Administered 2015-02-09: 1000 mg via INTRAVENOUS
  Filled 2015-02-09: qty 100

## 2015-02-09 MED ORDER — GLYCOPYRROLATE 0.2 MG/ML IJ SOLN
INTRAMUSCULAR | Status: DC | PRN
Start: 2015-02-09 — End: 2015-02-09
  Administered 2015-02-09: .8 mg via INTRAVENOUS

## 2015-02-09 MED ORDER — NEOSTIGMINE METHYLSULFATE 10 MG/10ML IV SOLN
INTRAVENOUS | Status: DC | PRN
  Administered 2015-02-09: 4 mg via INTRAVENOUS

## 2015-02-09 MED ORDER — MIDAZOLAM HCL 2 MG/2ML IJ SOLN
INTRAMUSCULAR | Status: DC | PRN
  Administered 2015-02-09: 2 mg via INTRAVENOUS

## 2015-02-09 MED ORDER — BUPIVACAINE-EPINEPHRINE 0.25% -1:200000 IJ SOLN
INTRAMUSCULAR | Status: DC | PRN
  Administered 2015-02-09: 13 mL

## 2015-02-09 MED ORDER — KETOROLAC TROMETHAMINE 30 MG/ML IJ SOLN
INTRAMUSCULAR | Status: DC | PRN
  Administered 2015-02-09: 30 mg via INTRAVENOUS

## 2015-02-09 MED ORDER — METOCLOPRAMIDE HCL 5 MG/ML IJ SOLN
INTRAMUSCULAR | Status: DC | PRN
  Administered 2015-02-09: 10 mg via INTRAVENOUS

## 2015-02-09 MED ORDER — SODIUM CHLORIDE 0.45 % IV SOLN
INTRAVENOUS | Status: DC
  Administered 2015-02-09 – 2015-02-10 (×3): via INTRAVENOUS

## 2015-02-09 MED ORDER — PROPOFOL 10 MG/ML IV BOLUS
INTRAVENOUS | Status: DC | PRN
  Administered 2015-02-09: 200 mg via INTRAVENOUS

## 2015-02-09 MED ORDER — DEXAMETHASONE SODIUM PHOSPHATE 4 MG/ML IJ SOLN
INTRAMUSCULAR | Status: AC
  Filled 2015-02-09: qty 1

## 2015-02-09 MED ORDER — ROCURONIUM BROMIDE 100 MG/10ML IV SOLN
INTRAVENOUS | Status: DC | PRN
  Administered 2015-02-09: 5 mg via INTRAVENOUS
  Administered 2015-02-09: 50 mg via INTRAVENOUS
  Administered 2015-02-09: 5 mg via INTRAVENOUS

## 2015-02-09 MED ORDER — FENTANYL CITRATE (PF) 250 MCG/5ML IJ SOLN
INTRAMUSCULAR | Status: AC
Start: 2015-02-09 — End: 2015-02-09
  Filled 2015-02-09: qty 5

## 2015-02-09 MED ORDER — FENTANYL CITRATE (PF) 100 MCG/2ML IJ SOLN
25.0000 ug | INTRAMUSCULAR | Status: DC | PRN
  Administered 2015-02-09 (×2): 50 ug via INTRAVENOUS

## 2015-02-09 SURGICAL SUPPLY — 61 items
ADAPTER CATH SYR TO TUBING 38M (ADAPTER) ×1 IMPLANT
ADPR CATH LL SYR 3/32 TPR (ADAPTER) ×1
BARRIER ADHS 3X4 INTERCEED (GAUZE/BANDAGES/DRESSINGS) IMPLANT
BRR ADH 4X3 ABS CNTRL BYND (GAUZE/BANDAGES/DRESSINGS)
BRR ADH 6X5 SEPRAFILM 1 SHT (MISCELLANEOUS) ×2
CATH ROBINSON RED A/P 16FR (CATHETERS) ×1 IMPLANT
CHLORAPREP W/TINT 26ML (MISCELLANEOUS) ×2 IMPLANT
CLOTH BEACON ORANGE TIMEOUT ST (SAFETY) ×2 IMPLANT
CONT PATH 16OZ SNAP LID 3702 (MISCELLANEOUS) ×2 IMPLANT
COVER BACK TABLE 60X90IN (DRAPES) ×4 IMPLANT
COVER TIP SHEARS 8 DVNC (MISCELLANEOUS) ×1 IMPLANT
COVER TIP SHEARS 8MM DA VINCI (MISCELLANEOUS) ×1
DECANTER SPIKE VIAL GLASS SM (MISCELLANEOUS) ×4 IMPLANT
DEVICE TROCAR PUNCTURE CLOSURE (ENDOMECHANICALS) IMPLANT
DRAPE WARM FLUID 44X44 (DRAPE) ×2 IMPLANT
DRSG COVADERM PLUS 2X2 (GAUZE/BANDAGES/DRESSINGS) ×5 IMPLANT
DRSG OPSITE POSTOP 3X4 (GAUZE/BANDAGES/DRESSINGS) ×2 IMPLANT
ELECT REM PT RETURN 9FT ADLT (ELECTROSURGICAL) ×2
ELECTRODE REM PT RTRN 9FT ADLT (ELECTROSURGICAL) ×1 IMPLANT
FILTER STRAW FLUID ASPIR (MISCELLANEOUS) IMPLANT
GAUZE VASELINE 3X9 (GAUZE/BANDAGES/DRESSINGS) IMPLANT
GLOVE BIO SURGEON STRL SZ8 (GLOVE) ×6 IMPLANT
GLOVE BIOGEL PI IND STRL 8.5 (GLOVE) ×1 IMPLANT
GLOVE BIOGEL PI INDICATOR 8.5 (GLOVE) ×1
KIT ABG SYR 3ML LUER SLIP (SYRINGE) ×2 IMPLANT
KIT ACCESSORY DA VINCI DISP (KITS) ×1
KIT ACCESSORY DVNC DISP (KITS) ×1 IMPLANT
LEGGING LITHOTOMY PAIR STRL (DRAPES) ×2 IMPLANT
LIQUID BAND (GAUZE/BANDAGES/DRESSINGS) ×2 IMPLANT
MANIPULATOR UTERINE 4.5 ZUMI (MISCELLANEOUS) ×2 IMPLANT
NDL SPNL 22GX7 QUINCKE BK (NEEDLE) IMPLANT
NEEDLE INSUFFLATION 120MM (ENDOMECHANICALS) ×2 IMPLANT
NEEDLE SPNL 22GX7 QUINCKE BK (NEEDLE) ×2 IMPLANT
NS IRRIG 1000ML POUR BTL (IV SOLUTION) ×6 IMPLANT
PACK ROBOT WH (CUSTOM PROCEDURE TRAY) ×2 IMPLANT
PACK ROBOTIC GOWN (GOWN DISPOSABLE) ×2 IMPLANT
PAD POSITIONER PINK NONSTERILE (MISCELLANEOUS) ×2 IMPLANT
SEPRAFILM MEMBRANE 5X6 (MISCELLANEOUS) ×2 IMPLANT
SET CYSTO W/LG BORE CLAMP LF (SET/KITS/TRAYS/PACK) IMPLANT
SET IRRIG TUBING LAPAROSCOPIC (IRRIGATION / IRRIGATOR) ×2 IMPLANT
SET TRI-LUMEN FLTR TB AIRSEAL (TUBING) ×2 IMPLANT
SUT MNCRL AB 4-0 PS2 18 (SUTURE) ×1 IMPLANT
SUT PDS AB 4-0 SH 27 (SUTURE) IMPLANT
SUT VIC AB 4-0 SH 27 (SUTURE) ×2
SUT VIC AB 4-0 SH 27XANBCTRL (SUTURE) IMPLANT
SUT VICRYL 0 UR6 27IN ABS (SUTURE) ×1 IMPLANT
SUT VLOC 180 2-0 9IN GS21 (SUTURE) ×2 IMPLANT
SUT VLOC 180 3-0 9IN GS21 (SUTURE) IMPLANT
SYR 50ML LL SCALE MARK (SYRINGE) IMPLANT
SYR TOOMEY 50ML (SYRINGE) ×2 IMPLANT
SYS LAPSCP GELPORT 120MM (MISCELLANEOUS) ×2
SYSTEM CONVERTIBLE TROCAR (TROCAR) IMPLANT
SYSTEM LAPSCP GELPORT 120MM (MISCELLANEOUS) ×1 IMPLANT
TOWEL OR 17X24 6PK STRL BLUE (TOWEL DISPOSABLE) ×6 IMPLANT
TRAY FOLEY BAG SILVER LF 16FR (SET/KITS/TRAYS/PACK) ×2 IMPLANT
TROCAR 12M 150ML BLUNT (TROCAR) IMPLANT
TROCAR DILATING TIP 12MM 150MM (ENDOMECHANICALS) ×2 IMPLANT
TROCAR DISP BLADELESS 8 DVNC (TROCAR) ×1 IMPLANT
TROCAR DISP BLADELESS 8MM (TROCAR) ×1
TROCAR PORT AIRSEAL 5X120 (TROCAR) ×1 IMPLANT
WARMER LAPAROSCOPE (MISCELLANEOUS) ×2 IMPLANT

## 2015-02-09 NOTE — Anesthesia Preprocedure Evaluation (Signed)
Anesthesia Evaluation  Patient identified by MRN, date of birth, ID band Patient awake    Reviewed: Allergy & Precautions, NPO status , Patient's Chart, lab work & pertinent test results  History of Anesthesia Complications (+) PONV and history of anesthetic complications  Airway Mallampati: II  TM Distance: >3 FB Neck ROM: Full    Dental no notable dental hx. (+) Dental Advisory Given, Poor Dentition   Pulmonary pneumonia -, resolved,  breath sounds clear to auscultation  Pulmonary exam normal       Cardiovascular Normal cardiovascular exam+ Valvular Problems/Murmurs Rhythm:Regular Rate:Normal     Neuro/Psych  Headaches, PSYCHIATRIC DISORDERS Anxiety Vasovagal syncope negative psych ROS   GI/Hepatic Neg liver ROS, GERD-  Medicated and Controlled,  Endo/Other  obesity  Renal/GU negative Renal ROS  negative genitourinary   Musculoskeletal negative musculoskeletal ROS (+)   Abdominal   Peds negative pediatric ROS (+)  Hematology  (+) anemia ,   Anesthesia Other Findings   Reproductive/Obstetrics negative OB ROS                             Anesthesia Physical Anesthesia Plan  ASA: II  Anesthesia Plan: General   Post-op Pain Management:    Induction: Intravenous  Airway Management Planned: Oral ETT  Additional Equipment:   Intra-op Plan:   Post-operative Plan: Extubation in OR  Informed Consent: I have reviewed the patients History and Physical, chart, labs and discussed the procedure including the risks, benefits and alternatives for the proposed anesthesia with the patient or authorized representative who has indicated his/her understanding and acceptance.   Dental advisory given  Plan Discussed with: CRNA  Anesthesia Plan Comments:         Anesthesia Quick Evaluation

## 2015-02-09 NOTE — Anesthesia Procedure Notes (Signed)
Procedure Name: Intubation Date/Time: 02/09/2015 12:01 PM Performed by: Tobin Chad Pre-anesthesia Checklist: Emergency Drugs available, Patient identified, Timeout performed, Suction available and Patient being monitored Patient Re-evaluated:Patient Re-evaluated prior to inductionOxygen Delivery Method: Circle system utilized and Simple face mask Preoxygenation: Pre-oxygenation with 100% oxygen Intubation Type: IV induction and Inhalational induction with existing ETT Laryngoscope Size: Mac and 3 Grade View: Grade II Tube type: Oral Number of attempts: 1 Placement Confirmation: ETT inserted through vocal cords under direct vision,  breath sounds checked- equal and bilateral and positive ETCO2 Secured at: 21 cm Tube secured with: Tape Dental Injury: Teeth and Oropharynx as per pre-operative assessment

## 2015-02-09 NOTE — H&P (Signed)
Diane [name] is a 34 y.o. female , originally referred to me by Dr. Newton Pigg, for robotic assisted myomectomy.  She was diagnosed with fibroids because of abnormal uterine bleeding. She has chronic pelvic pain, with stage I endometriosis and pelvic adhesions, due to a past pelvic infection Past response to Lupron Depot injections Breakthrough bleeding on continuous oral contraceptives. Currently at 3.5 cm transmural posterior myoma Patient would like to preserve her childbearing potential.  Pertinent Gynecological History: Menses: flow is excessive with use of 3 pads or tampons on heaviest days Bleeding: dysfunctional uterine bleeding Contraception: none DES exposure: denies Blood transfusions: none Sexually transmitted diseases: no past history Previous GYN Procedures: Hysteroscopy, Laparoscopy  Last mammogram: normal Last pap: normal  OB History: G2, P2   Menstrual History: Menarche age: 31 No LMP recorded.    Past Medical History  Diagnosis Date  . Crohn's disease   . PONV (postoperative nausea and vomiting)   . Pneumonia     history back in 2006  . Heart murmur     as child, never had any problems  . Anxiety   . Seasonal allergies   . Headache(784.0)     otc meds - last migraine 2009  . Low blood potassium     history  . Crohn's disease     history  . Anemia   . History of endometriosis   . Migraine without aura, with intractable migraine, so stated, without mention of status migrainosus 06/19/2013  . Vasovagal syncope   . Fibroid   . Endometriosis   . Vaginal Pap smear, abnormal   . H/O blood clots     tested positive but not located on doppler                     Past Surgical History  Procedure Laterality Date  . Tonsillectomy    . Adenoidectomy    . Dilatation & currettage/hysteroscopy with resectocope N/A 06/09/2013    Procedure: DILATATION & CURETTAGE/HYSTEROSCOPY WITH HYSTEROSCOPIC RESECTION OF SUBMUCOSAL FIBROID AND ENDOMETRIAL POLYP;   Surgeon: Marvene Staff, MD;  Location: [facility];  Service: Gynecology;  Laterality: N/A;  . Laparoscopy N/A 06/09/2013    Procedure: LAPAROSCOPY DIAGNOSTIC;  Surgeon: Marvene Staff, MD;  Location: [facility];  Service: Gynecology;  Laterality: N/A;  . Robotic assisted laparoscopic lysis of adhesion N/A 06/09/2013    Procedure: ROBOTIC ASSISTED LAPAROSCOPIC LYSIS OF ADHESION; Resection of Endometriosis and excision of fibroid;  Surgeon: Marvene Staff, MD;  Location: [facility];  Service: Gynecology;  Laterality: N/A;             Family History  Problem Relation Age of Onset  . Heart disease Mother 30    CHF  . Crohn's disease Mother   . Asthma Mother   . Asthma Brother   . Heart disease Maternal Aunt   . Diabetes Maternal Aunt   . Stroke Maternal Aunt   . Heart disease Maternal Grandfather   . Stroke Maternal Grandfather    No hereditary disease.  No cancer of breast, ovary, uterus. No cutaneous leiomyomatosis or renal cell carcinoma.  History   Social History  . Marital Status: Divorced    Spouse Name: N/A  . Number of Children: N/A  . Years of Education: N/A   Occupational History  . Not on file.   Social History Main Topics  . Smoking status: Never Smoker   . Smokeless tobacco: Never Used  . Alcohol Use: No  .  Drug Use: No  . Sexual Activity: Not on file     Comment: nuvaring   Other Topics Concern  . Not on file   Social History Narrative    No Known Allergies  No current facility-administered medications on file prior to encounter.   Current Outpatient Prescriptions on File Prior to Encounter  Medication Sig Dispense Refill  . FLUoxetine (PROZAC) 20 MG capsule Take 20 mg by mouth 2 (two) times daily.     Marland Kitchen HYDROcodone-acetaminophen (NORCO) 5-325 MG per tablet Take 1 tablet by mouth every 6 (six) hours as needed for moderate pain. (Patient not taking: Reported on 01/27/2015) 10 tablet 0     Review of Systems  Constitutional: Negative.   HENT:  Negative.   Eyes: Negative.   Respiratory: Negative.   Cardiovascular: Negative.   Gastrointestinal: Negative.   Genitourinary: Negative.   Musculoskeletal: Negative.   Skin: Negative.   Neurological: Negative.   Endo/Heme/Allergies: Negative.   Psychiatric/Behavioral: Negative.      Physical Exam  There were no vitals taken for this visit. Constitutional: She is oriented to person, place, and time. She appears well-developed and well-nourished.  HENT:  Head: Normocephalic and atraumatic.  Nose: Nose normal.  Mouth/Throat: Oropharynx is clear and moist. No oropharyngeal exudate.  Eyes: Conjunctivae normal and EOM are normal. Pupils are equal, round, and reactive to light. No scleral icterus.  Neck: Normal range of motion. Neck supple. No tracheal deviation present. No thyromegaly present.  Cardiovascular: Normal rate.   Respiratory: Effort normal and breath sounds normal.  GI: Soft. Bowel sounds are normal. She exhibits no distension and no mass. There is no tenderness.  Lymphadenopathy:    She has no cervical adenopathy.  Neurological: She is alert and oriented to person, place, and time. She has normal reflexes.  Skin: Skin is warm.  Psychiatric: She has a normal mood and affect. Her behavior is normal. Judgment and thought content normal.       Assessment/Plan:  She will have a laparoscopy, excision of endometriosis, with robotic-assisted presacral neurectomy.  The transmural myoma, which may be contributing to some of her abnormal uterine bleeding could be removed at the same time. Finally, I do agree that the definitive treatment for endometriosis causing chronic pelvic pain, unresponsive to other treatment modalities is hysterectomy with bilateral salpingo-oophorectomy.  I conveyed to the patient that this should be the final choice, after she understands the ramifications of surgical castration and the possibility of suffering from either hypoestrogenism or recurrence of  pain symptoms while she is being replaced with estrogen and progesterone.

## 2015-02-09 NOTE — Transfer of Care (Signed)
Immediate Anesthesia Transfer of Care Note  Patient: Diane [name]  Procedure(s) Performed: Procedure(s): MYOMECTOMY,EXCISION OF ENDOMETRIOSIS,PRE-SACRAL NEURECTOMY (N/A)  Patient Location: PACU  Anesthesia Type:General  Level of Consciousness: awake and sedated  Airway & Oxygen Therapy: Patient Spontanous Breathing and Patient connected to nasal cannula oxygen  Post-op Assessment: Report given to RN and Post -op Vital signs reviewed and stable  Post vital signs: Reviewed and stable  Last Vitals:  Filed Vitals:   02/09/15 1024  BP: 139/93  Pulse: 88  Temp: 36.8 C  Resp: 20    Complications: No apparent anesthesia complications

## 2015-02-09 NOTE — Op Note (Signed)
Preoperative diagnosis:  Uterine myoma, stage I endometriosis, pelvic pain and menorrhagia  Postoperative diagnosis: Uterine myoma, stage I endometriosis, pelvic pain and menorrhagia  Procedure: Laparoscopy, robotic assisted myomectomy, excision of endometriosis, presacral neurectomy, chromotubation Anesthesia: Gen. endotracheal  Surgeon: Governor Specking  Assistant: Clovia Cuff, MD Complications: None  Estimated blood loss: 150 mL Specimens: Myoma specimens, peritoneal excisional biopsies and presacral fat pad to pathology.  Findings: On exam under anesthesia, cervix was grossly normal the uterus was palpable normal size with a posterior nodule, there were no adnexal masses. There was no posterior fornix nodularity. Uterus sounded to 10 cm. On laparoscopy, there were perihepatic adhesions. Gallbladder and appendix were not visualized. There was a peritoneal pocket to the right of midline in the anterior cul-de-sac this was biopsied. The uterus contained a 3 cm posterior intramural myoma this was removed. We did not see any pelvic adhesions, apparently they have previously been removed. Was a general speckled pattern with clear blebs throughout the pelvic peritoneum, without exhibiting any fibrosis around them, and this was attributed to past pelvic infection. Both fallopian tubes appeared normal proximally and distally with 4 out of 5 fimbria. They did not fill with chromotubation and showed proximal occlusion. Both ovaries were grossly normal. There were stellate lesions of endometriosis in the left ovarian fossa was stellate lesions to the left and right of posterior cul-de-sac all of these were removed. There was a stellate lesion lateral to the right uterosacral ligament which was also removed. Presacral space anatomy was as expected. Description of the procedure: Patient was placed in lithotomy position and general endotracheal anesthesia was given. 2 g of cefazolin were given intravenously for  prophylaxis. She was prepped and draped in sterile manner. A Foley catheter was inserted into the bladder appeared . A ZUMI uterine manipulator was inserted into the uterus for uterine manipulation and chromotubation. The uterus sounded to 10 cm.   An operative field was created on the abdomen and the surgeon was regloved. After preemptive anesthesia with quarter percent bupivacaine and 1:200,000 epinephrine, and supra umbilical skin incision was made at the previous scar. A Verress needle was inserted and pneumoperitoneum was created with carbon dioxide. A 12 mm trocar was placed at this incision. Robotic laparoscope with 3-D camera was inserted and, under direct visualization, 1 left lateral 52mm incisions were made and corresponding robotic trochar was placed. On the right side a second robotic trocar was placed as well as a 5 mm assistant port. The da Vinci SI robot was docked to the patient after placing her in Trendelenburg position. The surgeon continued the rest of the procedure from the surgical console.  After careful inspection of the pelvic peritoneum the above-mentioned endometriotic lesions could be identified and the peritoneum was hydrodissected and these 3 lesions were grasped with a Maryland forceps and circumferentially cut and then dissected from the underlying areolar tissue and submitted to pathology. A dilute vasopressin solution (0.33 units per milliliter) was injected transcutaneously into the myometrium of the uterus. Using cutting current of 15 W on the monopolar scissors, the myometrium overlying the the posterior myoma was incised transversely. The fibroid was grasped with a tenaculum and was dissected free of the pseudocapsule by blunt and sharp dissection. Bipolar coagulation was applied on bleeding spots.. Hemostasis was insured. The myoma defect was closed 3 layers the first and second layers were a 2-0 V-lock continuous suture and the third layer was a 4-0 Vicryl continuous  suture.  We then performed a presacral neurectomy in the  following manner: An age shaved incision was performed vertically at midline on the peritoneum between the bifurcation of the aorta and the peritoneal flap was developed laterally up to the left ureter, while retracting the sigmoid colon laterally and to the right ureter. The fat pad in this area was carefully dissected from the adventitia of the common iliac arteries, the vena cava and bipolar-coagulated cephalad and caudad and then cut, without injury to inferior mesenteric artery, and submitted to pathology. Good hemostasis was achieved. We then cut the fibroid into quarters and removed it through the 12 mm supraumbilical port. Pelvis was copiously irrigated with lactated Ringer solution and a slurry of Seprafilm (2 sheets in 40 mL of lactated Ringer solution) was instilled into the pelvis as an adhesion barrier. The umbilical incision was closed with 0 Vicryl figure-of-eight sutures. The skin incisions were approximated with 4-0 Monocryl subcuticular sutures and Steri-Strips were applied.  At this point the procedure was terminated hemostasis was insured. Instrument and lap pad count was correct.   The patient tolerated the procedure well and was transferred to recovery room in satisfactory condition.  Special Note: Due to to significant myometrial incision, the patient is recommended to have cesarean delivery with future pregnancies.  Governor Specking

## 2015-02-09 NOTE — Anesthesia Postprocedure Evaluation (Signed)
  Anesthesia Post-op Note  Patient: [name]  Procedure(s) Performed: Procedure(s): MYOMECTOMY,EXCISION OF ENDOMETRIOSIS,PRE-SACRAL NEURECTOMY (N/A)  Patient Location: PACU  Anesthesia Type:General   Level of Consciousness: awake, alert  and oriented  Airway and Oxygen Therapy: Patient Spontanous Breathing  Post-op Pain: none  Post-op Assessment: Post-op Vital signs reviewed, Patient's Cardiovascular Status Stable, Respiratory Function Stable, Patent Airway, No signs of Nausea or vomiting and Pain level controlled  Post-op Vital Signs: Reviewed and stable  Last Vitals:  Filed Vitals:   02/09/15 1545  BP: 150/93  Pulse: 65  Temp:   Resp: 15    Complications: No apparent anesthesia complications

## 2015-02-10 ENCOUNTER — Encounter (HOSPITAL_COMMUNITY): Payer: Self-pay | Admitting: Obstetrics and Gynecology

## 2015-02-10 DIAGNOSIS — D259 Leiomyoma of uterus, unspecified: Secondary | ICD-10-CM | POA: Diagnosis not present

## 2015-02-10 MED ORDER — IBUPROFEN 600 MG PO TABS
600.0000 mg | ORAL_TABLET | Freq: Four times a day (QID) | ORAL | Status: DC | PRN
  Administered 2015-02-11: 600 mg via ORAL
  Filled 2015-02-10: qty 1

## 2015-02-10 MED ORDER — NON FORMULARY: 10.0000 mg | Freq: Every day | Status: DC

## 2015-02-10 MED ORDER — OXYCODONE-ACETAMINOPHEN 5-325 MG PO TABS
2.0000 | ORAL_TABLET | Freq: Once | ORAL | Status: AC
Start: 2015-02-10 — End: 2015-02-10
  Administered 2015-02-10: 2 via ORAL
  Filled 2015-02-10: qty 2

## 2015-02-10 MED ORDER — HYDROCODONE-ACETAMINOPHEN 5-325 MG PO TABS
1.0000 | ORAL_TABLET | ORAL | Status: DC | PRN
  Administered 2015-02-10 – 2015-02-11 (×2): 1 via ORAL
  Filled 2015-02-10 (×2): qty 1

## 2015-02-10 MED ORDER — CETIRIZINE HCL 10 MG PO TABS
10.0000 mg | ORAL_TABLET | Freq: Every day | ORAL | Status: DC
  Administered 2015-02-10: 10 mg via ORAL

## 2015-02-10 MED ORDER — DIPHENHYDRAMINE HCL 25 MG PO CAPS
25.0000 mg | ORAL_CAPSULE | Freq: Four times a day (QID) | ORAL | Status: DC | PRN
  Administered 2015-02-10: 25 mg via ORAL
  Filled 2015-02-10: qty 1

## 2015-02-10 MED ORDER — OXYCODONE-ACETAMINOPHEN 5-325 MG PO TABS
1.0000 | ORAL_TABLET | ORAL | Status: DC | PRN
  Administered 2015-02-10: 2 via ORAL
  Filled 2015-02-10: qty 2

## 2015-02-10 NOTE — Anesthesia Postprocedure Evaluation (Signed)
  Anesthesia Post-op Note  Patient: Diane [name]  Procedure(s) Performed: Procedure(s): MYOMECTOMY,EXCISION OF ENDOMETRIOSIS,PRE-SACRAL NEURECTOMY (N/A)  Patient Location: [facility]  Anesthesia Type:General  Level of Consciousness: awake, alert , oriented and patient cooperative  Airway and Oxygen Therapy: Patient Spontanous Breathing  Post-op Pain: mild  Post-op Assessment: Post-op Vital signs reviewed, Patient's Cardiovascular Status Stable, Respiratory Function Stable, Patent Airway and No signs of Nausea or vomiting  Post-op Vital Signs: Reviewed and stable  Last Vitals:  Filed Vitals:   02/10/15 0535  BP: 112/67  Pulse: 100  Temp: 37 C  Resp: 18    Complications: No apparent anesthesia complications

## 2015-02-10 NOTE — Progress Notes (Signed)
Pt's mother phoned RN and stated that she was not happy because her daughter did not feel good, and was not going to "sign her daughter out of the hospital if she felt that way." RN stated she would need to speak with Dr. Kerin Perna in regards to discharge.  RN spoke with pt, and pt stated that she had called Dr. Kerin Perna and he was in a meeting and she was waiting for him to call her back because she just felt like she could not go home, due to having to walk up so many steps. RN called Dr. Kerin Perna, and he gave orders to cancel discharge and he will see pt tomorrow and discharge then.

## 2015-02-10 NOTE — Progress Notes (Signed)
Pt states she spoke via telephone with Dr. Kerin Perna, and he will see her in his office tomorrow for incision check.

## 2015-02-10 NOTE — Addendum Note (Signed)
Addendum  created 02/10/15 0748 by Raenette Rover, CRNA   Modules edited: Notes Section   Notes Section:  File: [id number]

## 2015-02-11 DIAGNOSIS — D259 Leiomyoma of uterus, unspecified: Secondary | ICD-10-CM | POA: Diagnosis not present

## 2015-02-11 NOTE — Discharge Summary (Signed)
  Pt did well postop L/S RAM, and PSN with exc of endo. Stayed in observation for concer forn "pain control". VSS  Abd: soft n/t Inc.s: dry and intact  A&P: Stable D/c home RTO in 2 wk.

## 2015-02-11 NOTE — Progress Notes (Signed)
Out in wheelchair teaching complete 

## 2015-02-12 MED FILL — Heparin Sodium (Porcine) Inj 5000 Unit/ML: INTRAMUSCULAR | Qty: 1 | Status: AC

## 2015-03-05 ENCOUNTER — Emergency Department (HOSPITAL_BASED_OUTPATIENT_CLINIC_OR_DEPARTMENT_OTHER): Payer: 59

## 2015-03-05 ENCOUNTER — Emergency Department (HOSPITAL_BASED_OUTPATIENT_CLINIC_OR_DEPARTMENT_OTHER)
Admission: EM | Admit: 2015-03-05 | Discharge: 2015-03-05 | Disposition: A | Discharge status: Home / Self Care | Payer: 59 | Attending: Emergency Medicine | Admitting: Emergency Medicine

## 2015-03-05 ENCOUNTER — Encounter (HOSPITAL_BASED_OUTPATIENT_CLINIC_OR_DEPARTMENT_OTHER): Payer: Self-pay

## 2015-03-05 DIAGNOSIS — Z8619 Personal history of other infectious and parasitic diseases: Secondary | ICD-10-CM | POA: Insufficient documentation

## 2015-03-05 DIAGNOSIS — G8918 Other acute postprocedural pain: Secondary | ICD-10-CM | POA: Insufficient documentation

## 2015-03-05 DIAGNOSIS — Z8639 Personal history of other endocrine, nutritional and metabolic disease: Secondary | ICD-10-CM | POA: Diagnosis not present

## 2015-03-05 DIAGNOSIS — G43909 Migraine, unspecified, not intractable, without status migrainosus: Secondary | ICD-10-CM | POA: Insufficient documentation

## 2015-03-05 DIAGNOSIS — R102 Pelvic and perineal pain: Secondary | ICD-10-CM | POA: Diagnosis not present

## 2015-03-05 DIAGNOSIS — Z862 Personal history of diseases of the blood and blood-forming organs and certain disorders involving the immune mechanism: Secondary | ICD-10-CM | POA: Insufficient documentation

## 2015-03-05 DIAGNOSIS — F419 Anxiety disorder, unspecified: Secondary | ICD-10-CM | POA: Diagnosis not present

## 2015-03-05 DIAGNOSIS — Z8742 Personal history of other diseases of the female genital tract: Secondary | ICD-10-CM | POA: Insufficient documentation

## 2015-03-05 DIAGNOSIS — Z8701 Personal history of pneumonia (recurrent): Secondary | ICD-10-CM | POA: Insufficient documentation

## 2015-03-05 DIAGNOSIS — Z79899 Other long term (current) drug therapy: Secondary | ICD-10-CM | POA: Diagnosis not present

## 2015-03-05 DIAGNOSIS — R011 Cardiac murmur, unspecified: Secondary | ICD-10-CM | POA: Diagnosis not present

## 2015-03-05 LAB — URINALYSIS, ROUTINE W REFLEX MICROSCOPIC
Glucose, UA: NEGATIVE mg/dL
Hgb urine dipstick: NEGATIVE
Ketones, ur: 15 mg/dL — AB
Leukocytes, UA: NEGATIVE
Nitrite: NEGATIVE
Protein, ur: NEGATIVE mg/dL
Specific Gravity, Urine: 1.036 — ABNORMAL HIGH (ref 1.005–1.030)
Urobilinogen, UA: 1 mg/dL (ref 0.0–1.0)
pH: 6 (ref 5.0–8.0)

## 2015-03-05 MED ORDER — IBUPROFEN 200 MG PO TABS
600.0000 mg | ORAL_TABLET | Freq: Once | ORAL | Status: AC
  Administered 2015-03-05: 600 mg via ORAL
  Filled 2015-03-05 (×2): qty 1

## 2015-03-05 NOTE — ED Notes (Signed)
Pt noted to have 141 ml of urine on the bladder scanner.

## 2015-03-05 NOTE — ED Notes (Signed)
Pt c/o pain increase after ibuprofen, 9/10, pinpoints to RLQ, also mentions some light headedness, HA and nausea (denies: dizziness or sob). Family at [facility]. VSS. CBIR.

## 2015-03-05 NOTE — ED Provider Notes (Signed)
CSN: [id number]     Arrival date & time 03/05/15  1912 History   This chart was scribed for Charlesetta Shanks, MD by Chester Holstein, ED Scribe. This patient was seen in room MH10/MH10 and the patient's care was started at 8:18 PM.    Chief Complaint  Patient presents with  . Pelvic Pain     The history is provided by the patient. No language interpreter was used.   HPI Comments: Diane [name] is a 34 y.o. female with PMHx Crohn's disease, endometriosis, fibroid, and thrombosis who presents to the Emergency Department complaining of constant pelvic pain worse when voiding with onset 6 days ago. She notes lying back worsens the discomfort. Pt notes associated dysuria and mild vaginal discharge. Pt is s/p myomectomy, excision of endometriosis, and pre-sacral neurectomy on 02/09/15. She notes she is sore around 2 of the incisions. Pt has not taken the pain medication in 6 days as it was causing constipation. Pt took a laxative with relief. Pt denies any sexual activity since procedure. Pt spoke with OB/GYN and was told to come to ED for evaluation. Pt denies urinary frequency, urgency, or retention, vaginal bleeding, and hematuria.   Past Medical History  Diagnosis Date  . Crohn's disease   . PONV (postoperative nausea and vomiting)   . Pneumonia     history back in 2006  . Heart murmur     as child, never had any problems  . Anxiety   . Seasonal allergies   . Headache(784.0)     otc meds - last migraine 2009  . Low blood potassium     history  . Crohn's disease     history  . Anemia   . History of endometriosis   . Migraine without aura, with intractable migraine, so stated, without mention of status migrainosus 06/19/2013  . Vasovagal syncope   . Fibroid   . Endometriosis   . Vaginal Pap smear, abnormal   . H/O blood clots     tested positive but not located on doppler    Past Surgical History  Procedure Laterality Date  . Tonsillectomy    . Adenoidectomy    . Dilatation &  currettage/hysteroscopy with resectocope N/A 06/09/2013    Procedure: DILATATION & CURETTAGE/HYSTEROSCOPY WITH HYSTEROSCOPIC RESECTION OF SUBMUCOSAL FIBROID AND ENDOMETRIAL POLYP;  Surgeon: Marvene Staff, MD;  Location: [facility];  Service: Gynecology;  Laterality: N/A;  . Laparoscopy N/A 06/09/2013    Procedure: LAPAROSCOPY DIAGNOSTIC;  Surgeon: Marvene Staff, MD;  Location: [facility];  Service: Gynecology;  Laterality: N/A;  . Robotic assisted laparoscopic lysis of adhesion N/A 06/09/2013    Procedure: ROBOTIC ASSISTED LAPAROSCOPIC LYSIS OF ADHESION; Resection of Endometriosis and excision of fibroid;  Surgeon: Marvene Staff, MD;  Location: [facility];  Service: Gynecology;  Laterality: N/A;  . Robot assisted myomectomy N/A 02/09/2015    Procedure: MYOMECTOMY,EXCISION OF ENDOMETRIOSIS,PRE-SACRAL NEURECTOMY;  Surgeon: Governor Specking, MD;  Location: [facility];  Service: Gynecology;  Laterality: N/A;   Family History  Problem Relation Age of Onset  . Heart disease Mother 72    CHF  . Crohn's disease Mother   . Asthma Mother   . Asthma Brother   . Heart disease Maternal Aunt   . Diabetes Maternal Aunt   . Stroke Maternal Aunt   . Heart disease Maternal Grandfather   . Stroke Maternal Grandfather    History  Substance Use Topics  . Smoking status: Never Smoker   . Smokeless tobacco: Never  Used  . Alcohol Use: No   OB History    Gravida Para Term Preterm AB TAB SAB Ectopic Multiple Living   2 2 2       2      Review of Systems  Genitourinary: Positive for pelvic pain.   A complete 10 system review of systems was obtained and all systems are negative except as noted in the HPI and PMH.     Allergies  Review of patient's allergies indicates no known allergies.  Home Medications   Prior to Admission medications   Medication Sig Start Date End Date Taking? Authorizing Provider  amphetamine-dextroamphetamine (ADDERALL) 30 MG tablet Take 30 mg by mouth 2 (two) times daily.    Yes Historical Provider, MD  cloNIDine (CATAPRES) 0.2 MG tablet Take 0.3 mg by mouth at bedtime.   Yes Historical Provider, MD  fexofenadine (ALLEGRA) 180 MG tablet Take 180-360 mg by mouth daily.   Yes Historical Provider, MD  FLUoxetine (PROZAC) 20 MG capsule Take 20 mg by mouth 2 (two) times daily.    Yes Historical Provider, MD  LORazepam (ATIVAN) 0.5 MG tablet Take 0.5 mg by mouth at bedtime.   Yes Historical Provider, MD  traMADol (ULTRAM) 50 MG tablet Take 50 mg by mouth every 6 (six) hours as needed for moderate pain.   Yes Historical Provider, MD  HYDROcodone-acetaminophen (NORCO) 5-325 MG per tablet Take 1 tablet by mouth every 6 (six) hours as needed for moderate pain. Patient not taking: Reported on 01/27/2015 10/03/14   Ashley Murrain, NP  medroxyPROGESTERone (PROVERA) 10 MG tablet Take 10 mg by mouth daily.    Historical Provider, MD   BP 142/95 mmHg  Pulse 104  Temp(Src) 98.4 F (36.9 C) (Oral)  Resp 18  Ht 5' 7"  (1.702 m)  Wt 182 lb (82.555 kg)  BMI 28.50 kg/m2  SpO2 100% Physical Exam  Constitutional: She is oriented to person, place, and time. She appears well-developed and well-nourished.  HENT:  Head: Normocephalic and atraumatic.  Eyes: EOM are normal. Pupils are equal, round, and reactive to light.  Neck: Neck supple.  Cardiovascular: Normal rate, regular rhythm, normal heart sounds and intact distal pulses.   Pulmonary/Chest: Effort normal and breath sounds normal.  Abdominal: Soft. Bowel sounds are normal. She exhibits no distension. There is tenderness.  The patient has 4 laparoscopic incisions that are healing well. No drainage discharge or surrounding erythema. The abdomen is soft without guarding. Patient endorses mild diffuse enters to palpation.  Musculoskeletal: Normal range of motion. She exhibits no edema.  Neurological: She is alert and oriented to person, place, and time. She has normal strength. Coordination normal. GCS eye subscore is 4. GCS verbal  subscore is 5. GCS motor subscore is 6.  Skin: Skin is warm, dry and intact.  Psychiatric: She has a normal mood and affect.    ED Course  Procedures (including critical care time) DIAGNOSTIC STUDIES: Oxygen Saturation is 100% on room air, normal by my interpretation.    COORDINATION OF CARE: 8:26 PM Discussed treatment plan with patient at beside, the patient agrees with the plan and has no further questions at this time.   Labs Review Labs Reviewed  URINALYSIS, ROUTINE W REFLEX MICROSCOPIC (NOT AT [facility]) - Abnormal; Notable for the following:    Color, Urine AMBER (*)    Specific Gravity, Urine 1.036 (*)    Bilirubin Urine SMALL (*)    Ketones, ur 15 (*)    All other components within normal  limits    Imaging Review Dg Abd Acute W/chest  03/05/2015   CLINICAL DATA:  Pelvic pain, onset 6 days ago.  Worse with voiding.  EXAM: DG ABDOMEN ACUTE W/ 1V CHEST  COMPARISON:  08/16/2014  FINDINGS: There is no evidence of dilated bowel loops or free intraperitoneal air. No radiopaque calculi or other significant radiographic abnormality is seen. Heart size and mediastinal contours are within normal limits. Both lungs are clear.  IMPRESSION: Negative abdominal radiographs.  No acute cardiopulmonary disease.   Electronically Signed   By: Andreas Newport M.D.   On: 03/05/2015 20:59     EKG Interpretation None      MDM   Final diagnoses:  Pelvic pain in female  Post-operative pain   The patient identifies pelvic discomfort that seems to be most associated with urination. She identifies getting some relief after urinating and some resolution of radiating pain in both of her adnexa after urinating. Urinalysis does not show evidence of UTI. Patient does not have flank pain. She is status post GYN myomectomy approximately 3 weeks ago. Her abdominal examination does not reveal peritoneal signs or suggestion of any wound infection. Bladder scan does not suggest urinary retention with overflow.  At this time I do feel patient is safe to continue outpatient care and follow-up with her gynecologist. The pain is not localizing to suggest other surgical etiology such as appendicitis or cholecystitis. She is not having vomiting or signs of acute obstruction.   Charlesetta Shanks, MD 03/05/15 2227

## 2015-03-05 NOTE — ED Notes (Addendum)
Pt reports laparoscopic GYN surgery one month ago - reports dysuria, pain in pelvic region. Reports her Ob GYN referred her here for evaluation of possible UTI r/t Urinary Catheter placement during surgery. Pt reports concern about history of low potassium.

## 2015-03-05 NOTE — Discharge Instructions (Signed)
Abdominal Pain, Women °Abdominal (stomach, pelvic, or belly) pain can be caused by many things. It is important to tell your doctor: °· The location of the pain. °· Does it come and go or is it present all the time? °· Are there things that start the pain (eating certain foods, exercise)? °· Are there other symptoms associated with the pain (fever, nausea, vomiting, diarrhea)? °All of this is helpful to know when trying to find the cause of the pain. °CAUSES  °· Stomach: virus or bacteria infection, or ulcer. °· Intestine: appendicitis (inflamed appendix), regional ileitis (Crohn's disease), ulcerative colitis (inflamed colon), irritable bowel syndrome, diverticulitis (inflamed diverticulum of the colon), or cancer of the stomach or intestine. °· Gallbladder disease or stones in the gallbladder. °· Kidney disease, kidney stones, or infection. °· Pancreas infection or cancer. °· Fibromyalgia (pain disorder). °· Diseases of the female organs: °¨ Uterus: fibroid (non-cancerous) tumors or infection. °¨ Fallopian tubes: infection or tubal pregnancy. °¨ Ovary: cysts or tumors. °¨ Pelvic adhesions (scar tissue). °¨ Endometriosis (uterus lining tissue growing in the pelvis and on the pelvic organs). °¨ Pelvic congestion syndrome (female organs filling up with blood just before the menstrual period). °¨ Pain with the menstrual period. °¨ Pain with ovulation (producing an egg). °¨ Pain with an IUD (intrauterine device, birth control) in the uterus. °¨ Cancer of the female organs. °· Functional pain (pain not caused by a disease, may improve without treatment). °· Psychological pain. °· Depression. °DIAGNOSIS  °Your doctor will decide the seriousness of your pain by doing an examination. °· Blood tests. °· X-rays. °· Ultrasound. °· CT scan (computed tomography, special type of X-ray). °· MRI (magnetic resonance imaging). °· Cultures, for infection. °· Barium enema (dye inserted in the large intestine, to better view it with  X-rays). °· Colonoscopy (looking in intestine with a lighted tube). °· Laparoscopy (minor surgery, looking in abdomen with a lighted tube). °· Major abdominal exploratory surgery (looking in abdomen with a large incision). °TREATMENT  °The treatment will depend on the cause of the pain.  °· Many cases can be observed and treated at home. °· Over-the-counter medicines recommended by your caregiver. °· Prescription medicine. °· Antibiotics, for infection. °· Birth control pills, for painful periods or for ovulation pain. °· Hormone treatment, for endometriosis. °· Nerve blocking injections. °· Physical therapy. °· Antidepressants. °· Counseling with a psychologist or psychiatrist. °· Minor or major surgery. °HOME CARE INSTRUCTIONS  °· Do not take laxatives, unless directed by your caregiver. °· Take over-the-counter pain medicine only if ordered by your caregiver. Do not take aspirin because it can cause an upset stomach or bleeding. °· Try a clear liquid diet (broth or water) as ordered by your caregiver. Slowly move to a bland diet, as tolerated, if the pain is related to the stomach or intestine. °· Have a thermometer and take your temperature several times a day, and record it. °· Bed rest and sleep, if it helps the pain. °· Avoid sexual intercourse, if it causes pain. °· Avoid stressful situations. °· Keep your follow-up appointments and tests, as your caregiver orders. °· If the pain does not go away with medicine or surgery, you may try: °¨ Acupuncture. °¨ Relaxation exercises (yoga, meditation). °¨ Group therapy. °¨ Counseling. °SEEK MEDICAL CARE IF:  °· You notice certain foods cause stomach pain. °· Your home care treatment is not helping your pain. °· You need stronger pain medicine. °· You want your IUD removed. °· You feel faint or   lightheaded. °· You develop nausea and vomiting. °· You develop a rash. °· You are having side effects or an allergy to your medicine. °SEEK IMMEDIATE MEDICAL CARE IF:  °· Your  pain does not go away or gets worse. °· You have a fever. °· Your pain is felt only in portions of the abdomen. The right side could possibly be appendicitis. The left lower portion of the abdomen could be colitis or diverticulitis. °· You are passing blood in your stools (bright red or black tarry stools, with or without vomiting). °· You have blood in your urine. °· You develop chills, with or without a fever. °· You pass out. °MAKE SURE YOU:  °· Understand these instructions. °· Will watch your condition. °· Will get help right away if you are not doing well or get worse. °Document Released: 07/08/2007 Document Revised: 01/25/2014 Document Reviewed: 07/28/2009 °[employer]® Patient Information ©2015 [employer]. This information is not intended to replace advice given to you by your health care provider. Make sure you discuss any questions you have with your health care provider. ° °

## 2015-06-20 ENCOUNTER — Encounter: Payer: Self-pay | Admitting: Family Medicine

## 2015-06-20 ENCOUNTER — Ambulatory Visit (INDEPENDENT_AMBULATORY_CARE_PROVIDER_SITE_OTHER): Payer: 59 | Admitting: Family Medicine

## 2015-06-20 VITALS — BP 130/90 | HR 95 | Temp 98.2°F | Wt 181.0 lb

## 2015-06-20 DIAGNOSIS — Z23 Encounter for immunization: Secondary | ICD-10-CM

## 2015-06-20 DIAGNOSIS — R109 Unspecified abdominal pain: Secondary | ICD-10-CM | POA: Diagnosis not present

## 2015-06-20 LAB — POCT URINALYSIS DIPSTICK
Bilirubin, UA: NEGATIVE
Blood, UA: NEGATIVE
Glucose, UA: NEGATIVE
Ketones, UA: NEGATIVE
Leukocytes, UA: NEGATIVE
Nitrite, UA: NEGATIVE
Protein, UA: NEGATIVE
Spec Grav, UA: 1.02
Urobilinogen, UA: 2
pH, UA: 8.5

## 2015-06-20 NOTE — Patient Instructions (Signed)
Follow up for any persistent or worsening pain or any new symptoms such as lower extremity weakness or numbness.

## 2015-06-20 NOTE — Progress Notes (Signed)
Subjective:    Patient ID: Diane [name], female    DOB: 1981-07-27, 34 y.o.   MRN: [id number]  HPI Patient seen with poorly localized left flank and lumbar pain. For about one month now she does occasional sharp and occasional dull pains in this region possibly worse with walking. No injury. No radiculopathy pains. No skin rash. No urinary symptoms. Denies any fevers or chills. She has history of endometriosis and had GYN surgery last spring. Her menses have been normal. No alleviating factors for her pain above. No appetite or weight changes. No fevers or chills. No dysuria.  Patient requesting Pneumovax. Nonsmoker. She has no chronic lung disease but has had pneumonia 3 times she states in her adulthood.  Past Medical History  Diagnosis Date  . Crohn's disease   . PONV (postoperative nausea and vomiting)   . Pneumonia     history back in 2006  . Heart murmur     as child, never had any problems  . Anxiety   . Seasonal allergies   . Headache(784.0)     otc meds - last migraine 2009  . Low blood potassium     history  . Crohn's disease     history  . Anemia   . History of endometriosis   . Migraine without aura, with intractable migraine, so stated, without mention of status migrainosus 06/19/2013  . Vasovagal syncope   . Fibroid   . Endometriosis   . Vaginal Pap smear, abnormal   . H/O blood clots     tested positive but not located on doppler    Past Surgical History  Procedure Laterality Date  . Tonsillectomy    . Adenoidectomy    . Dilatation & currettage/hysteroscopy with resectocope N/A 06/09/2013    Procedure: DILATATION & CURETTAGE/HYSTEROSCOPY WITH HYSTEROSCOPIC RESECTION OF SUBMUCOSAL FIBROID AND ENDOMETRIAL POLYP;  Surgeon: Marvene Staff, MD;  Location: [facility];  Service: Gynecology;  Laterality: N/A;  . Laparoscopy N/A 06/09/2013    Procedure: LAPAROSCOPY DIAGNOSTIC;  Surgeon: Marvene Staff, MD;  Location: [facility];  Service: Gynecology;  Laterality:  N/A;  . Robotic assisted laparoscopic lysis of adhesion N/A 06/09/2013    Procedure: ROBOTIC ASSISTED LAPAROSCOPIC LYSIS OF ADHESION; Resection of Endometriosis and excision of fibroid;  Surgeon: Marvene Staff, MD;  Location: [facility];  Service: Gynecology;  Laterality: N/A;  . Robot assisted myomectomy N/A 02/09/2015    Procedure: MYOMECTOMY,EXCISION OF ENDOMETRIOSIS,PRE-SACRAL NEURECTOMY;  Surgeon: Governor Specking, MD;  Location: [facility];  Service: Gynecology;  Laterality: N/A;    reports that she has never smoked. She has never used smokeless tobacco. She reports that she does not drink alcohol or use illicit drugs. family history includes Asthma in her brother and mother; Crohn's disease in her mother; Diabetes in her maternal aunt; Heart disease in her maternal aunt and maternal grandfather; Heart disease (age of onset: 14) in her mother; Stroke in her maternal aunt and maternal grandfather. No Known Allergies    Review of Systems  Constitutional: Negative for fever, chills, appetite change and unexpected weight change.  Respiratory: Negative for cough and shortness of breath.   Cardiovascular: Negative for chest pain.  Gastrointestinal: Negative for nausea, vomiting, diarrhea, constipation, blood in stool and abdominal distention.  Genitourinary: Negative for dysuria and hematuria.  Musculoskeletal: Positive for back pain.       Objective:   Physical Exam  Constitutional: She appears well-developed and well-nourished. No distress.  Cardiovascular: Normal rate and regular rhythm.  Pulmonary/Chest: Effort normal and breath sounds normal. No respiratory distress. She has no wheezes. She has no rales.  Abdominal: Soft. There is no tenderness.  Musculoskeletal:  Full range of motion left hip. Nonspecific mild tenderness lower lumbar region Minimally tender over her greater trochanteric bursa on the left but this is not the region that she complained of pain. Straight leg raise is  negative. No iliac crest tenderness          Assessment & Plan:  Poorly localized pain around her left flank and left iliac crest region. This would not appear to be hip joint problem. Hip exam relatively unremarkable. She does have some mild left flank pain and urinalysis is unremarkable. Pain is relatively mild and recommend observation unless couple weeks. If persisting at point consider further imaging-consider lumbosacral films

## 2015-06-20 NOTE — Progress Notes (Signed)
Pre visit review using our clinic review tool, if applicable. No additional management support is needed unless otherwise documented below in the visit note. 

## 2015-07-23 ENCOUNTER — Emergency Department (HOSPITAL_BASED_OUTPATIENT_CLINIC_OR_DEPARTMENT_OTHER)
Admission: EM | Admit: 2015-07-23 | Discharge: 2015-07-24 | Disposition: A | Discharge status: Home / Self Care | Payer: 59 | Attending: Emergency Medicine | Admitting: Emergency Medicine

## 2015-07-23 ENCOUNTER — Encounter (HOSPITAL_BASED_OUTPATIENT_CLINIC_OR_DEPARTMENT_OTHER): Payer: Self-pay

## 2015-07-23 DIAGNOSIS — Z862 Personal history of diseases of the blood and blood-forming organs and certain disorders involving the immune mechanism: Secondary | ICD-10-CM | POA: Insufficient documentation

## 2015-07-23 DIAGNOSIS — Z8742 Personal history of other diseases of the female genital tract: Secondary | ICD-10-CM | POA: Insufficient documentation

## 2015-07-23 DIAGNOSIS — F419 Anxiety disorder, unspecified: Secondary | ICD-10-CM | POA: Diagnosis not present

## 2015-07-23 DIAGNOSIS — Z86018 Personal history of other benign neoplasm: Secondary | ICD-10-CM | POA: Diagnosis not present

## 2015-07-23 DIAGNOSIS — M5442 Lumbago with sciatica, left side: Secondary | ICD-10-CM

## 2015-07-23 DIAGNOSIS — M545 Low back pain: Secondary | ICD-10-CM | POA: Diagnosis present

## 2015-07-23 DIAGNOSIS — Z8701 Personal history of pneumonia (recurrent): Secondary | ICD-10-CM | POA: Insufficient documentation

## 2015-07-23 DIAGNOSIS — R011 Cardiac murmur, unspecified: Secondary | ICD-10-CM | POA: Insufficient documentation

## 2015-07-23 DIAGNOSIS — G43019 Migraine without aura, intractable, without status migrainosus: Secondary | ICD-10-CM | POA: Diagnosis not present

## 2015-07-23 DIAGNOSIS — Z3202 Encounter for pregnancy test, result negative: Secondary | ICD-10-CM | POA: Insufficient documentation

## 2015-07-23 MED ORDER — OXYCODONE-ACETAMINOPHEN 5-325 MG PO TABS
2.0000 | ORAL_TABLET | Freq: Once | ORAL | Status: AC
  Administered 2015-07-24: 2 via ORAL
  Filled 2015-07-23: qty 2

## 2015-07-23 MED ORDER — METHOCARBAMOL 500 MG PO TABS
1000.0000 mg | ORAL_TABLET | Freq: Once | ORAL | Status: AC
  Administered 2015-07-24: 1000 mg via ORAL
  Filled 2015-07-23: qty 2

## 2015-07-23 NOTE — ED Notes (Signed)
EDP into room, at BS.  ?

## 2015-07-23 NOTE — ED Provider Notes (Signed)
CSN: [id number]     Arrival date & time 07/23/15  2221 History   First MD Initiated Contact with Patient 07/23/15 2255     Chief Complaint  Patient presents with  . Hip Pain     (Consider location/radiation/quality/duration/timing/severity/associated sxs/prior Treatment) HPI   Diane [name] is a 34 y.o. female presenting with lower left back pain, sometimes extending down into her left hip for about a week. Pt rates pain 10/10, states it is between a pulled muscle and a "piercing" pain. Pt is uncomfortable in any position. Pt has tried ibuprofen and a heating pad with no relief.  When she walks has pain that extends down into her buttocks and into left inguinal area. Denies fever/chills, urinary symptoms, vaginal bleeding/discharge, falls or injuries, or any other pain or complaints.     Past Medical History  Diagnosis Date  . Crohn's disease ([place])   . PONV (postoperative nausea and vomiting)   . Pneumonia     history back in 2006  . Heart murmur     as child, never had any problems  . Anxiety   . Seasonal allergies   . Headache(784.0)     otc meds - last migraine 2009  . Low blood potassium     history  . Crohn's disease ([place])     history  . Anemia   . History of endometriosis   . Migraine without aura, with intractable migraine, so stated, without mention of status migrainosus 06/19/2013  . Vasovagal syncope   . Fibroid   . Endometriosis   . Vaginal Pap smear, abnormal   . H/O blood clots     tested positive but not located on doppler    Past Surgical History  Procedure Laterality Date  . Tonsillectomy    . Adenoidectomy    . Dilatation & currettage/hysteroscopy with resectocope N/A 06/09/2013    Procedure: DILATATION & CURETTAGE/HYSTEROSCOPY WITH HYSTEROSCOPIC RESECTION OF SUBMUCOSAL FIBROID AND ENDOMETRIAL POLYP;  Surgeon: Marvene Staff, MD;  Location: [facility];  Service: Gynecology;  Laterality: N/A;  . Laparoscopy N/A 06/09/2013    Procedure: LAPAROSCOPY  DIAGNOSTIC;  Surgeon: Marvene Staff, MD;  Location: [facility];  Service: Gynecology;  Laterality: N/A;  . Robotic assisted laparoscopic lysis of adhesion N/A 06/09/2013    Procedure: ROBOTIC ASSISTED LAPAROSCOPIC LYSIS OF ADHESION; Resection of Endometriosis and excision of fibroid;  Surgeon: Marvene Staff, MD;  Location: [facility];  Service: Gynecology;  Laterality: N/A;  . Robot assisted myomectomy N/A 02/09/2015    Procedure: MYOMECTOMY,EXCISION OF ENDOMETRIOSIS,PRE-SACRAL NEURECTOMY;  Surgeon: Governor Specking, MD;  Location: [facility];  Service: Gynecology;  Laterality: N/A;   Family History  Problem Relation Age of Onset  . Heart disease Mother 30    CHF  . Crohn's disease Mother   . Asthma Mother   . Asthma Brother   . Heart disease Maternal Aunt   . Diabetes Maternal Aunt   . Stroke Maternal Aunt   . Heart disease Maternal Grandfather   . Stroke Maternal Grandfather    Social History  Substance Use Topics  . Smoking status: Never Smoker   . Smokeless tobacco: Never Used  . Alcohol Use: No   OB History    Gravida Para Term Preterm AB TAB SAB Ectopic Multiple Living   2 2 2       2      Review of Systems  Constitutional: Negative for fever, chills and diaphoresis.  Genitourinary: Negative for dysuria, frequency, hematuria, flank pain,  decreased urine volume, vaginal bleeding, vaginal discharge and pelvic pain.  Musculoskeletal: Positive for back pain. Negative for arthralgias and neck pain.  Skin: Negative for color change and pallor.  Neurological: Negative for dizziness, syncope, weakness, light-headedness and numbness.  All other systems reviewed and are negative.     Allergies  Review of patient's allergies indicates no known allergies.  Home Medications   Prior to Admission medications   Medication Sig Start Date End Date Taking? Authorizing Provider  amphetamine-dextroamphetamine (ADDERALL) 30 MG tablet Take 30 mg by mouth 2 (two) times daily.   Yes  Historical Provider, MD  cloNIDine (CATAPRES) 0.2 MG tablet Take 0.3 mg by mouth at bedtime.   Yes Historical Provider, MD  fexofenadine (ALLEGRA) 180 MG tablet Take 180-360 mg by mouth daily.   Yes Historical Provider, MD  FLUoxetine (PROZAC) 20 MG capsule Take 20 mg by mouth 2 (two) times daily.    Yes Historical Provider, MD  LORazepam (ATIVAN) 0.5 MG tablet Take 0.5 mg by mouth at bedtime.   Yes Historical Provider, MD  ibuprofen (ADVIL,MOTRIN) 800 MG tablet Take 1 tablet (800 mg total) by mouth 3 (three) times daily. 07/24/15   Leanthony Rhett C Judiann Celia, PA-C  methocarbamol (ROBAXIN) 500 MG tablet Take 1 tablet (500 mg total) by mouth 2 (two) times daily. 07/24/15   Cuthbert Turton C Graison Leinberger, PA-C   BP 144/93 mmHg  Pulse 92  Temp(Src) 99.1 F (37.3 C) (Oral)  Resp 18  Ht 5\' 7"  (1.702 m)  Wt 185 lb (83.915 kg)  BMI 28.97 kg/m2  SpO2 100%  LMP 07/15/2015 Physical Exam  Constitutional: She appears well-developed and well-nourished.  Abdominal: Soft. Normal appearance. There is generalized tenderness. There is no CVA tenderness, no tenderness at McBurney's point and negative Murphy's sign.  Musculoskeletal:  Tenderness to musculature on left side. ROM intact in spine, but movement painful.   Neurological:  No sensory deficits noted. Strength 5/5 in all extremities. Gait is slow and pt in pain with walking.   Nursing note and vitals reviewed.   ED Course  Procedures (including critical care time) Labs Review Labs Reviewed  URINALYSIS, ROUTINE W REFLEX MICROSCOPIC (NOT AT [facility])  PREGNANCY, URINE    Imaging Review No results found. I have personally reviewed and evaluated these images and lab results as part of my medical decision-making.   EKG Interpretation None      MDM   Final diagnoses:  Left-sided low back pain with left-sided sciatica    Diane [name] presents with lower left back pain sometimes extending down the back of her left leg.  Findings and plan of care discussed with Veryl Speak, MD.  Suspect this could be musculoskeletal pain. Will get UA and pregnancy test to test for other possibilities. Need to rule out ectopic pregnancy, UTI and kidney stone. Doubt ovarian torsion due to history of presentation. Will also administer pain medication and robaxin. If pt improves with this therapy and labs are negative for abnormalities, then this gives more evidence to a musculoskeletal source of this pain and this can be followed up on by her PCP.  Pt symptoms do not give indication for emergent MRI or CT at this time.  End of shift patient care report given to Veryl Speak, MD.  Lorayne Bender, PA-C 07/24/15 [id number]  Veryl Speak, MD 07/24/15 [phone]

## 2015-07-23 NOTE — ED Notes (Signed)
C/o L lower back pain and hip pain, some radiation down L leg intermitantly, (denies: vaginal or urinary sx, fever, nvd, bleeding, weakness, falling, injury, tripping, numbness or tingling), h/o similar, has seen chiropractor in the past. Has been given back exercises to do in the past. Mentions excessive sitting at work.

## 2015-07-23 NOTE — ED Notes (Signed)
Pt reports left hip pain, left lower back pain, with radiation down left leg, states that the pain has progressively worsened, is worse at night, and with movement. Heating pad and NSAIDS are ineffective.

## 2015-07-24 LAB — URINALYSIS, ROUTINE W REFLEX MICROSCOPIC
Glucose, UA: NEGATIVE mg/dL
Hgb urine dipstick: NEGATIVE
Ketones, ur: 15 mg/dL — AB
Nitrite: NEGATIVE
Protein, ur: NEGATIVE mg/dL
Specific Gravity, Urine: 1.024 (ref 1.005–1.030)
Urobilinogen, UA: 1 mg/dL (ref 0.0–1.0)
pH: 6.5 (ref 5.0–8.0)

## 2015-07-24 LAB — URINE MICROSCOPIC-ADD ON

## 2015-07-24 LAB — PREGNANCY, URINE: Preg Test, Ur: NEGATIVE

## 2015-07-24 MED ORDER — METHOCARBAMOL 500 MG PO TABS: 500.0000 mg | ORAL_TABLET | Freq: Two times a day (BID) | ORAL | Status: DC

## 2015-07-24 MED ORDER — IBUPROFEN 800 MG PO TABS: 800.0000 mg | ORAL_TABLET | Freq: Three times a day (TID) | ORAL | Status: DC

## 2015-07-24 NOTE — Discharge Instructions (Signed)
You have been seen today for back pain. Your lab tests showed no abnormalities. Follow up with PCP as needed for chronic care of this issue. Return to ED should symptoms worsen.

## 2015-08-27 ENCOUNTER — Encounter (HOSPITAL_BASED_OUTPATIENT_CLINIC_OR_DEPARTMENT_OTHER): Payer: Self-pay | Admitting: Emergency Medicine

## 2015-08-27 ENCOUNTER — Emergency Department (HOSPITAL_BASED_OUTPATIENT_CLINIC_OR_DEPARTMENT_OTHER)
Admission: EM | Admit: 2015-08-27 | Discharge: 2015-08-27 | Disposition: A | Discharge status: Home / Self Care | Payer: 59 | Attending: Emergency Medicine | Admitting: Emergency Medicine

## 2015-08-27 DIAGNOSIS — Z862 Personal history of diseases of the blood and blood-forming organs and certain disorders involving the immune mechanism: Secondary | ICD-10-CM | POA: Insufficient documentation

## 2015-08-27 DIAGNOSIS — Z8701 Personal history of pneumonia (recurrent): Secondary | ICD-10-CM | POA: Diagnosis not present

## 2015-08-27 DIAGNOSIS — F419 Anxiety disorder, unspecified: Secondary | ICD-10-CM | POA: Insufficient documentation

## 2015-08-27 DIAGNOSIS — Z8742 Personal history of other diseases of the female genital tract: Secondary | ICD-10-CM | POA: Insufficient documentation

## 2015-08-27 DIAGNOSIS — Z791 Long term (current) use of non-steroidal anti-inflammatories (NSAID): Secondary | ICD-10-CM | POA: Insufficient documentation

## 2015-08-27 DIAGNOSIS — Z79899 Other long term (current) drug therapy: Secondary | ICD-10-CM | POA: Diagnosis not present

## 2015-08-27 DIAGNOSIS — Z86012 Personal history of benign carcinoid tumor: Secondary | ICD-10-CM | POA: Diagnosis not present

## 2015-08-27 DIAGNOSIS — R011 Cardiac murmur, unspecified: Secondary | ICD-10-CM | POA: Diagnosis not present

## 2015-08-27 DIAGNOSIS — Z8639 Personal history of other endocrine, nutritional and metabolic disease: Secondary | ICD-10-CM | POA: Insufficient documentation

## 2015-08-27 DIAGNOSIS — R58 Hemorrhage, not elsewhere classified: Secondary | ICD-10-CM

## 2015-08-27 DIAGNOSIS — Z8719 Personal history of other diseases of the digestive system: Secondary | ICD-10-CM | POA: Insufficient documentation

## 2015-08-27 DIAGNOSIS — M7981 Nontraumatic hematoma of soft tissue: Secondary | ICD-10-CM | POA: Diagnosis not present

## 2015-08-27 DIAGNOSIS — Z86018 Personal history of other benign neoplasm: Secondary | ICD-10-CM | POA: Diagnosis not present

## 2015-08-27 DIAGNOSIS — G43019 Migraine without aura, intractable, without status migrainosus: Secondary | ICD-10-CM | POA: Diagnosis not present

## 2015-08-27 DIAGNOSIS — R252 Cramp and spasm: Secondary | ICD-10-CM | POA: Diagnosis present

## 2015-08-27 LAB — CBC
HCT: 32.3 % — ABNORMAL LOW (ref 36.0–46.0)
Hemoglobin: 10.8 g/dL — ABNORMAL LOW (ref 12.0–15.0)
MCH: 27.7 pg (ref 26.0–34.0)
MCHC: 33.4 g/dL (ref 30.0–36.0)
MCV: 82.8 fL (ref 78.0–100.0)
Platelets: 231 10*3/uL (ref 150–400)
RBC: 3.9 MIL/uL (ref 3.87–5.11)
RDW: 12.9 % (ref 11.5–15.5)
WBC: 7.1 10*3/uL (ref 4.0–10.5)

## 2015-08-27 NOTE — ED Provider Notes (Signed)
CSN: [id number]     Arrival date & time 08/27/15  0029 History   First MD Initiated Contact with Patient 08/27/15 0049     Chief Complaint  Patient presents with  . Claudication     (Consider location/radiation/quality/duration/timing/severity/associated sxs/prior Treatment) HPI Comments: Patient presents to the ER for evaluation of a bruise on her left lower leg. Patient reports that this evening she started to have a cramp in her lower leg and foot. Her husband was massaging her leg to make a cramp CVA tenderness that she had a bruise on the top portion of her calf. Patient reports that she does not remember injuring herself. She has not noticed bruising elsewhere, no bleeding, easy bleeding, etc.   Past Medical History  Diagnosis Date  . Crohn's disease ([place])   . PONV (postoperative nausea and vomiting)   . Pneumonia     history back in 2006  . Heart murmur     as child, never had any problems  . Anxiety   . Seasonal allergies   . Headache(784.0)     otc meds - last migraine 2009  . Low blood potassium     history  . Crohn's disease ([place])     history  . Anemia   . History of endometriosis   . Migraine without aura, with intractable migraine, so stated, without mention of status migrainosus 06/19/2013  . Vasovagal syncope   . Fibroid   . Endometriosis   . Vaginal Pap smear, abnormal   . H/O blood clots     tested positive but not located on doppler    Past Surgical History  Procedure Laterality Date  . Tonsillectomy    . Adenoidectomy    . Dilatation & currettage/hysteroscopy with resectocope N/A 06/09/2013    Procedure: DILATATION & CURETTAGE/HYSTEROSCOPY WITH HYSTEROSCOPIC RESECTION OF SUBMUCOSAL FIBROID AND ENDOMETRIAL POLYP;  Surgeon: Marvene Staff, MD;  Location: [facility];  Service: Gynecology;  Laterality: N/A;  . Laparoscopy N/A 06/09/2013    Procedure: LAPAROSCOPY DIAGNOSTIC;  Surgeon: Marvene Staff, MD;  Location: [facility];  Service: Gynecology;   Laterality: N/A;  . Robotic assisted laparoscopic lysis of adhesion N/A 06/09/2013    Procedure: ROBOTIC ASSISTED LAPAROSCOPIC LYSIS OF ADHESION; Resection of Endometriosis and excision of fibroid;  Surgeon: Marvene Staff, MD;  Location: [facility];  Service: Gynecology;  Laterality: N/A;  . Robot assisted myomectomy N/A 02/09/2015    Procedure: MYOMECTOMY,EXCISION OF ENDOMETRIOSIS,PRE-SACRAL NEURECTOMY;  Surgeon: Governor Specking, MD;  Location: [facility];  Service: Gynecology;  Laterality: N/A;   Family History  Problem Relation Age of Onset  . Heart disease Mother 49    CHF  . Crohn's disease Mother   . Asthma Mother   . Asthma Brother   . Heart disease Maternal Aunt   . Diabetes Maternal Aunt   . Stroke Maternal Aunt   . Heart disease Maternal Grandfather   . Stroke Maternal Grandfather    Social History  Substance Use Topics  . Smoking status: Never Smoker   . Smokeless tobacco: Never Used  . Alcohol Use: No   OB History    Gravida Para Term Preterm AB TAB SAB Ectopic Multiple Living   2 2 2       2      Review of Systems  Skin: Positive for color change.  All other systems reviewed and are negative.     Allergies  Review of patient's allergies indicates no known allergies.  Home Medications   Prior  to Admission medications   Medication Sig Start Date End Date Taking? Authorizing Provider  amphetamine-dextroamphetamine (ADDERALL) 30 MG tablet Take 30 mg by mouth 2 (two) times daily.    Historical Provider, MD  cloNIDine (CATAPRES) 0.2 MG tablet Take 0.3 mg by mouth at bedtime.    Historical Provider, MD  fexofenadine (ALLEGRA) 180 MG tablet Take 180-360 mg by mouth daily.    Historical Provider, MD  FLUoxetine (PROZAC) 20 MG capsule Take 20 mg by mouth 2 (two) times daily.     Historical Provider, MD  ibuprofen (ADVIL,MOTRIN) 800 MG tablet Take 1 tablet (800 mg total) by mouth 3 (three) times daily. 07/24/15   Shawn C Joy, PA-C  LORazepam (ATIVAN) 0.5 MG tablet Take  0.5 mg by mouth at bedtime.    Historical Provider, MD  methocarbamol (ROBAXIN) 500 MG tablet Take 1 tablet (500 mg total) by mouth 2 (two) times daily. 07/24/15   Shawn C Joy, PA-C   BP 120/89 mmHg  Pulse 107  Temp(Src) 98.3 F (36.8 C) (Oral)  Resp 18  Ht 5\' 7"  (1.702 m)  Wt 186 lb (84.369 kg)  BMI 29.12 kg/m2  SpO2 99%  LMP 08/13/2015 Physical Exam  Constitutional: She is oriented to person, place, and time. She appears well-developed and well-nourished. No distress.  HENT:  Head: Normocephalic and atraumatic.  Right Ear: Hearing normal.  Left Ear: Hearing normal.  Nose: Nose normal.  Mouth/Throat: Oropharynx is clear and moist and mucous membranes are normal.  Eyes: Conjunctivae and EOM are normal. Pupils are equal, round, and reactive to light.  Neck: Normal range of motion. Neck supple.  Cardiovascular: Regular rhythm, S1 normal and S2 normal.  Exam reveals no gallop and no friction rub.   No murmur heard. Pulmonary/Chest: Effort normal and breath sounds normal. No respiratory distress. She exhibits no tenderness.  Abdominal: Soft. Normal appearance and bowel sounds are normal. There is no hepatosplenomegaly. There is no tenderness. There is no rebound, no guarding, no tenderness at McBurney's point and negative Murphy's sign. No hernia.  Musculoskeletal: Normal range of motion.       Left lower leg: She exhibits no tenderness and no swelling.  Neurological: She is alert and oriented to person, place, and time. She has normal strength. No cranial nerve deficit or sensory deficit. Coordination normal. GCS eye subscore is 4. GCS verbal subscore is 5. GCS motor subscore is 6.  Skin: Skin is warm, dry and intact. No rash noted. No cyanosis.     Psychiatric: She has a normal mood and affect. Her speech is normal and behavior is normal. Thought content normal.  Nursing note and vitals reviewed.   ED Course  Procedures (including critical care time) Labs Review Labs Reviewed    CBC    Imaging Review No results found. I have personally reviewed and evaluated these images and lab results as part of my medical decision-making.   EKG Interpretation None      MDM   Final diagnoses:  None   ecchymosis  Patient has a 3 cm circular bruise on the posterior aspect of her calf. She does not remember injuring herself there. She does not have any calf swelling or tenderness. Negative Homans. No venous cords. There is nothing about this presentation would suggest DVT. She had a muscle cramp earlier tonight that has resolved. There is no other evidence of bruising elsewhere. CBC was performed to evaluate for possible thrombocytopenia and platelet count is normal. Patient reassured, no further investigation necessary.  Orpah Greek, MD 08/27/15 [phone]

## 2015-08-27 NOTE — Discharge Instructions (Signed)

## 2015-08-27 NOTE — ED Notes (Signed)
MD at bedside. 

## 2015-08-27 NOTE — ED Notes (Signed)
Pt states tingling in left foot last few days. States woke up with charlie horse cramp in leg and when husband went to rub it noticed brusing behind left calf.  Pt states has history of elevated d-dimer but when scans are done they never show an actual blood clot.

## 2015-09-12 IMAGING — DX DG ABDOMEN ACUTE W/ 1V CHEST
4 series · 4 of 4 positions shown · non-contrast
Comparison: 08/16/2014

CLINICAL DATA: Pelvic pain, onset 6 days ago.  Worse with voiding.

EXAM:
DG ABDOMEN ACUTE W/ 1V CHEST

[chest pa]
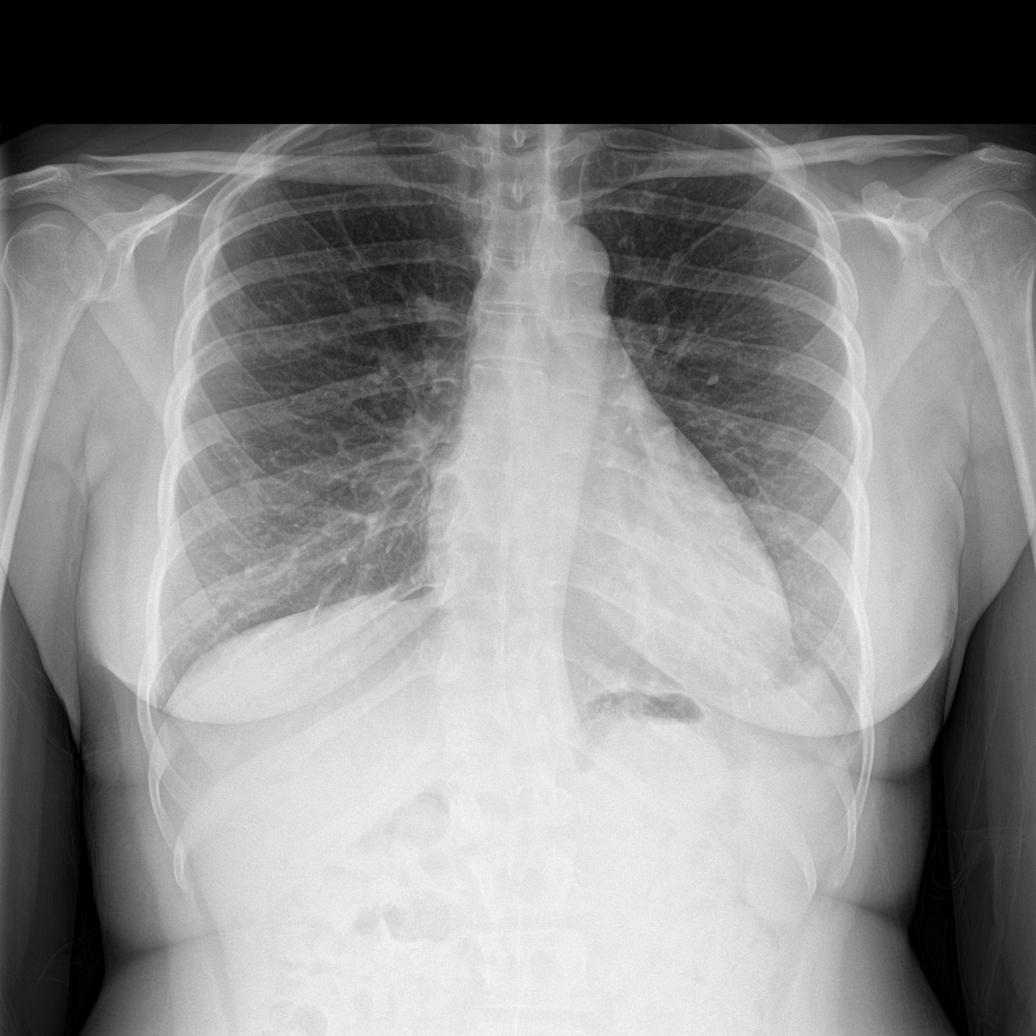

[abdomen erect]
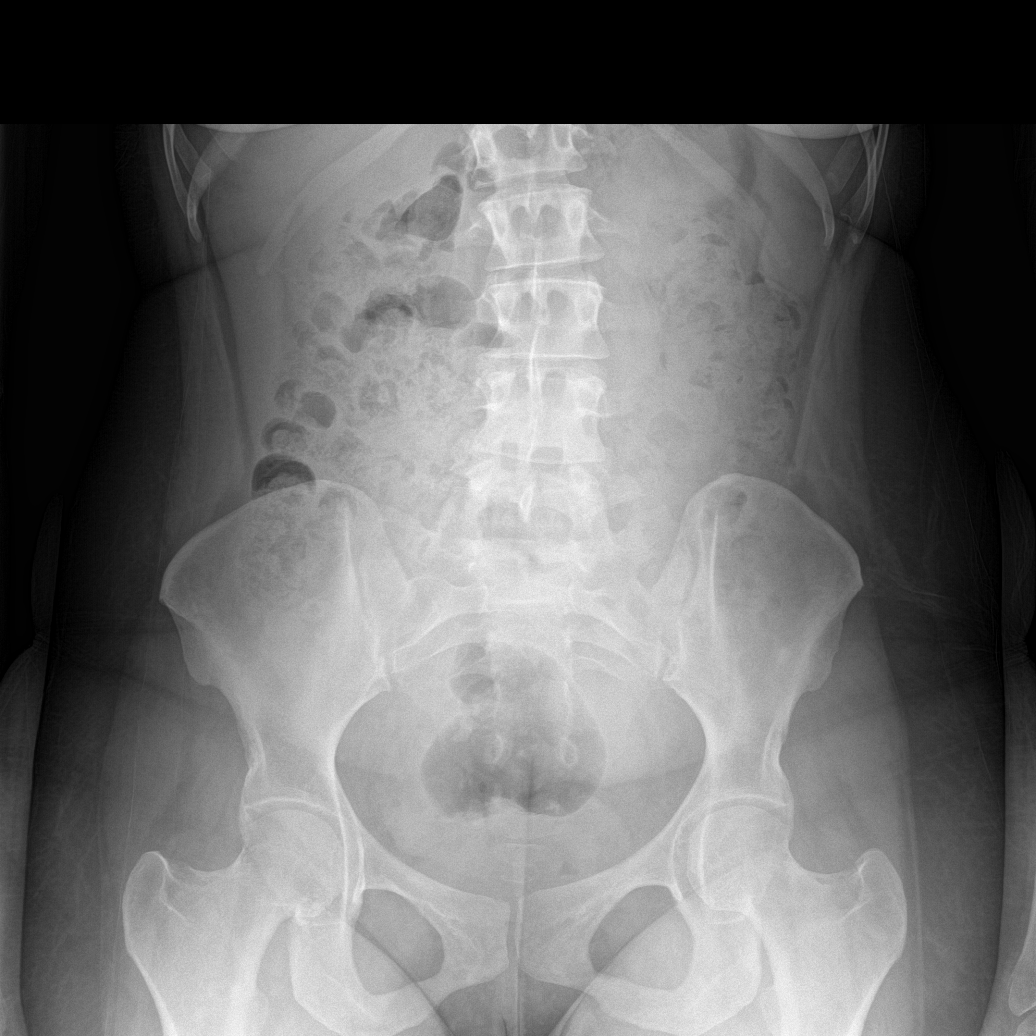

[abdomen supine (1 of 2)]
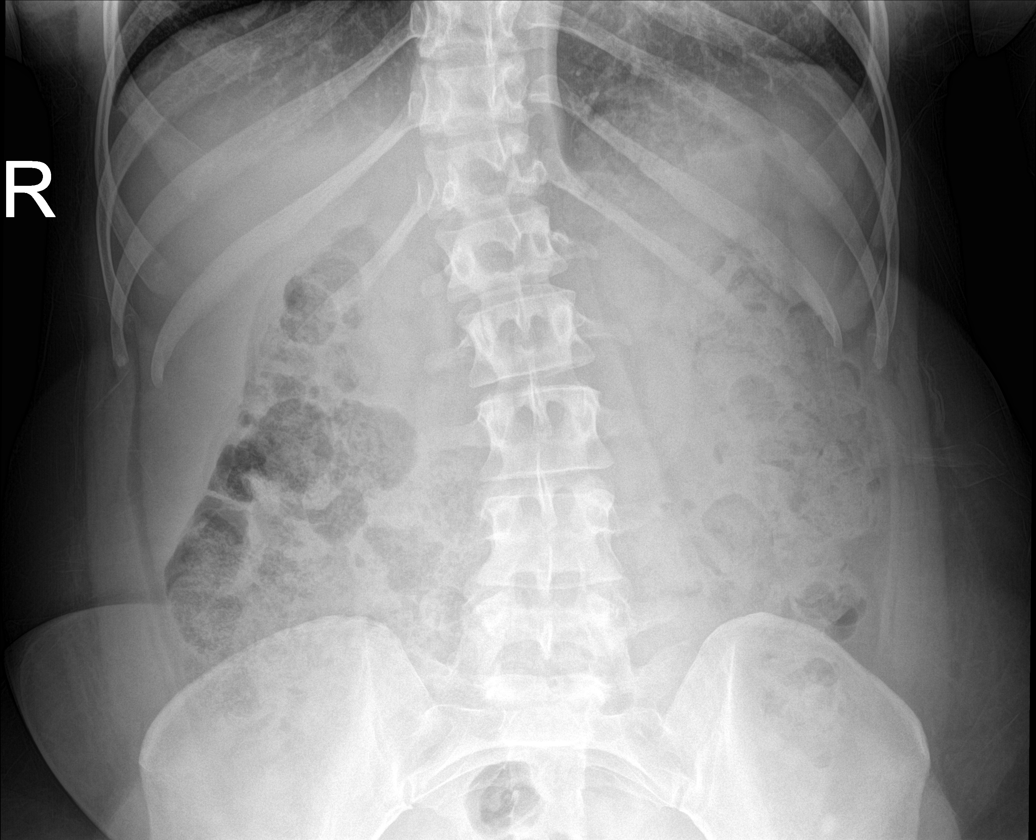

[abdomen supine (2 of 2)]
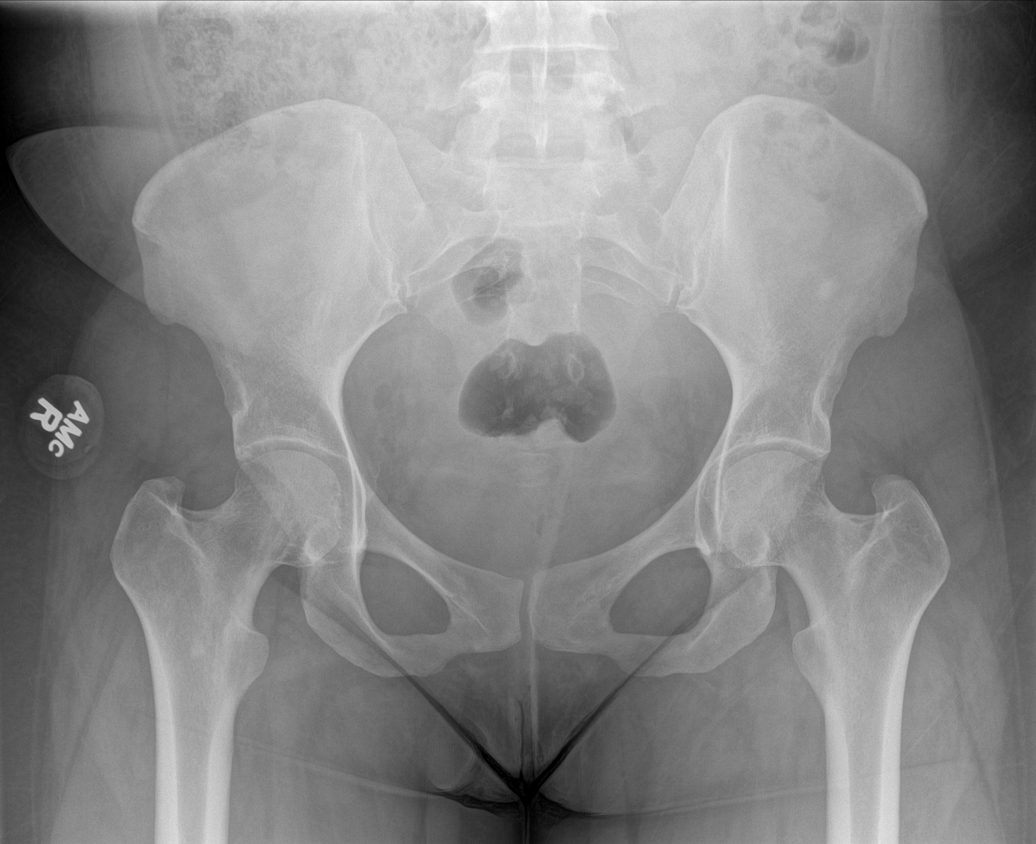

[4 of 4 positions shown; findings below may reference images not displayed]

FINDINGS: There is no evidence of dilated bowel loops or free intraperitoneal
air. No radiopaque calculi or other significant radiographic
abnormality is seen. Heart size and mediastinal contours are within
normal limits. Both lungs are clear.
IMPRESSION: Negative abdominal radiographs.  No acute cardiopulmonary disease.

## 2015-09-14 ENCOUNTER — Telehealth: Payer: Self-pay | Admitting: Family Medicine

## 2015-09-14 DIAGNOSIS — M5430 Sciatica, unspecified side: Secondary | ICD-10-CM

## 2015-09-14 NOTE — Telephone Encounter (Signed)
OK to set up 

## 2015-09-14 NOTE — Telephone Encounter (Signed)
Diane [name] called saying she needs a referral to [facility] (ph# [phone]) in order to be seen. She went to the ER earlier this month and was told her pain stimulated from the sciatic nerve. She said she's seen Dr. Elease Hashimoto about this as well. Please call her if she needs to make an appt.  Pt's ph# [phone] Thank you.

## 2015-09-14 NOTE — Telephone Encounter (Signed)
This was discuss on 9/26 at [facility]. Please advise if okay for Neuro Referral.

## 2015-09-15 NOTE — Telephone Encounter (Signed)
Order entered and pt is aware. 

## 2015-10-04 ENCOUNTER — Encounter (INDEPENDENT_AMBULATORY_CARE_PROVIDER_SITE_OTHER): Payer: Self-pay

## 2015-10-04 ENCOUNTER — Encounter: Payer: Self-pay | Admitting: Neurology

## 2015-10-04 ENCOUNTER — Ambulatory Visit (INDEPENDENT_AMBULATORY_CARE_PROVIDER_SITE_OTHER): Payer: 59 | Admitting: Neurology

## 2015-10-04 VITALS — BP 150/93 | HR 89 | Ht 67.0 in | Wt 182.0 lb

## 2015-10-04 DIAGNOSIS — M545 Low back pain, unspecified: Secondary | ICD-10-CM

## 2015-10-04 DIAGNOSIS — G43019 Migraine without aura, intractable, without status migrainosus: Secondary | ICD-10-CM

## 2015-10-04 DIAGNOSIS — M79605 Pain in left leg: Secondary | ICD-10-CM | POA: Insufficient documentation

## 2015-10-04 MED ORDER — GABAPENTIN 300 MG PO CAPS: 300.0000 mg | ORAL_CAPSULE | Freq: Two times a day (BID) | ORAL | Status: DC

## 2015-10-04 NOTE — Progress Notes (Signed)
Reason for visit:  Back pain, left leg pain   Diane [name] is a 35 y.o. female  History of present illness:   Diane [name] is a 35 year old right-handed black female with a history of migraine headaches. The patient was seen through this office in 2014 for this issue. She indicates that her headaches have become less frequent, occurring less than once a month. The patient has a new problem at this time. She had surgery in May 2016 for resection of uterine fibroids and for endometriosis. In June of 2016 she had a couple bouts of brief left hip discomfort lasting 2 or 3 minutes, and some episodes also in July. Beginning in August 2016 the episodes of pain seemed to become more frequent, and have been daily since November 2016. Currently, the patient has pain all the time. She has pain in the left hip, left lower back, pain down the lateral and posterior aspect of the left thigh to just below the knee. Occasionally, she may have some discomfort into the foot. The patient denies any weakness or numbness. She denies any significant discomfort on the right side. She denies any balance issues, or difficulty controlling the bowels or the bladder. She does have a history of Crohn's disease. She has been to the emergency room on the 29th of October 2016 , no workup was done at that time. She has been placed on ibuprofen and Robaxin, these medications have not been effective in treating her pain. She comes to this office for further evaluation.  Past Medical History  Diagnosis Date  . Crohn's disease ([place])   . PONV (postoperative nausea and vomiting)   . Pneumonia     history back in 2006  . Heart murmur     as child, never had any problems  . Anxiety   . Seasonal allergies   . Headache(784.0)     otc meds - last migraine 2009  . Low blood potassium     history  . Crohn's disease ([place])     history  . Anemia   . History of endometriosis   . Migraine without aura, with intractable migraine, so  stated, without mention of status migrainosus 06/19/2013  . Vasovagal syncope   . Fibroid   . Endometriosis   . Vaginal Pap smear, abnormal   . H/O blood clots     tested positive but not located on doppler     Past Surgical History  Procedure Laterality Date  . Tonsillectomy    . Adenoidectomy    . Dilatation & currettage/hysteroscopy with resectocope N/A 06/09/2013    Procedure: DILATATION & CURETTAGE/HYSTEROSCOPY WITH HYSTEROSCOPIC RESECTION OF SUBMUCOSAL FIBROID AND ENDOMETRIAL POLYP;  Surgeon: Marvene Staff, MD;  Location: [facility];  Service: Gynecology;  Laterality: N/A;  . Laparoscopy N/A 06/09/2013    Procedure: LAPAROSCOPY DIAGNOSTIC;  Surgeon: Marvene Staff, MD;  Location: [facility];  Service: Gynecology;  Laterality: N/A;  . Robotic assisted laparoscopic lysis of adhesion N/A 06/09/2013    Procedure: ROBOTIC ASSISTED LAPAROSCOPIC LYSIS OF ADHESION; Resection of Endometriosis and excision of fibroid;  Surgeon: Marvene Staff, MD;  Location: [facility];  Service: Gynecology;  Laterality: N/A;  . Robot assisted myomectomy N/A 02/09/2015    Procedure: MYOMECTOMY,EXCISION OF ENDOMETRIOSIS,PRE-SACRAL NEURECTOMY;  Surgeon: Governor Specking, MD;  Location: [facility];  Service: Gynecology;  Laterality: N/A;    Family History  Problem Relation Age of Onset  . Heart disease Mother 63    CHF  .  Crohn's disease Mother   . Asthma Mother   . Asthma Brother   . Heart disease Maternal Aunt   . Diabetes Maternal Aunt   . Stroke Maternal Aunt   . Heart disease Maternal Grandfather   . Stroke Maternal Grandfather     Social history:  reports that she has never smoked. She has never used smokeless tobacco. She reports that she does not drink alcohol or use illicit drugs.  Medications:  Prior to Admission medications   Medication Sig Start Date End Date Taking? Authorizing Provider  amphetamine-dextroamphetamine (ADDERALL) 30 MG tablet Take 30 mg by mouth 2 (two) times daily.   Yes  Historical Provider, MD  cloNIDine (CATAPRES) 0.2 MG tablet Take 0.3 mg by mouth at bedtime.   Yes Historical Provider, MD  fexofenadine (ALLEGRA) 180 MG tablet Take 180-360 mg by mouth daily.   Yes Historical Provider, MD  FLUoxetine (PROZAC) 20 MG capsule Take 20 mg by mouth 2 (two) times daily.    Yes Historical Provider, MD  ibuprofen (ADVIL,MOTRIN) 800 MG tablet Take 1 tablet (800 mg total) by mouth 3 (three) times daily. 07/24/15  Yes Shawn C Joy, PA-C  LORazepam (ATIVAN) 0.5 MG tablet Take 0.5 mg by mouth at bedtime.   Yes Historical Provider, MD  methocarbamol (ROBAXIN) 500 MG tablet Take 1 tablet (500 mg total) by mouth 2 (two) times daily. 07/24/15  Yes Shawn C Joy, PA-C     No Known Allergies  ROS:  Out of a complete 14 system review of symptoms, the patient complains only of the following symptoms, and all other reviewed systems are negative.   Leg swelling  Restless legs, insomnia  Joint pain, joint swelling, back pain, muscle cramps, walking difficulty  Bruising easily  Memory changes , headache, numbness  Blood pressure 150/93, pulse 89, height 5\' 7"  (1.702 m), weight 182 lb (82.555 kg).  Physical Exam  General: The patient is alert and cooperative at the time of the examination. The patient is minimally obese.  Eyes: Pupils are equal, round, and reactive to light. Discs are flat bilaterally.  Neck: The neck is supple, no carotid bruits are noted.  Respiratory: The respiratory examination is clear.  Cardiovascular: The cardiovascular examination reveals a regular rate and rhythm, no obvious murmurs or rubs are noted.  Neuromuscular: the patient has minimal discomfort internal and external rotation of the hips bilaterally. The patient has full range of movement of the low back.  Skin: Extremities are without significant edema.  Neurologic Exam  Mental status: The patient is alert and oriented x 3 at the time of the examination. The patient has apparent normal  recent and remote memory, with an apparently normal attention span and concentration ability.  Cranial nerves: Facial symmetry is present. There is good sensation of the face to pinprick and soft touch bilaterally. The strength of the facial muscles and the muscles to head turning and shoulder shrug are normal bilaterally. Speech is well enunciated, no aphasia or dysarthria is noted. Extraocular movements are full. With superior gaze, there is exotropia of the right eye. Visual fields are full. The tongue is midline, and the patient has symmetric elevation of the soft palate. No obvious hearing deficits are noted.  Motor: The motor testing reveals 5 over 5 strength of all 4 extremities. Good symmetric motor tone is noted throughout.  Sensory: Sensory testing is intact to pinprick, soft touch, vibration sensation, and position sense on all 4 extremities. No evidence of extinction is noted.  Coordination:  Cerebellar testing reveals good finger-nose-finger and heel-to-shin bilaterally.  Gait and station: Gait is normal. Tandem gait is normal. Romberg is negative. No drift is seen. The patent is able to walk on heels and the toes bilaterally.  Reflexes: Deep tendon reflexes are symmetric and normal bilaterally. Toes are downgoing bilaterally.   Assessment/Plan:   1. Chronic back and left leg discomfort   2. History of migraine headache   The patient indicates that her migraine headaches are in good control currently. She now is reporting some back and left leg discomfort. The clinical examination is unremarkable. The patient will be treated with gabapentin, and she will be sent for physical therapy. If her symptoms do not improve, we may consider EMG and nerve conduction study or possibly MRI evaluation of the lumbar spine in the future. She will otherwise follow-up in 3-4 months.  Jill Alexanders MD 10/04/2015 3:44 PM  [facility] [address] [place], [place]  [address]  Phone [phone] Fax [phone]

## 2015-10-04 NOTE — Patient Instructions (Addendum)
We will place you on gabapentin for discomfort, and get physical therapy. If there is no improvement, further evaluation may be needed.   Back Pain, Adult Back pain is very common in adults.The cause of back pain is rarely dangerous and the pain often gets better over time.The cause of your back pain may not be known. Some common causes of back pain include:  Strain of the muscles or ligaments supporting the spine.  Wear and tear (degeneration) of the spinal disks.  Arthritis.  Direct injury to the back. For many people, back pain may return. Since back pain is rarely dangerous, most people can learn to manage this condition on their own. HOME CARE INSTRUCTIONS Watch your back pain for any changes. The following actions may help to lessen any discomfort you are feeling:  Remain active. It is stressful on your back to sit or stand in one place for long periods of time. Do not sit, drive, or stand in one place for more than 30 minutes at a time. Take short walks on even surfaces as soon as you are able.Try to increase the length of time you walk each day.  Exercise regularly as directed by your health care provider. Exercise helps your back heal faster. It also helps avoid future injury by keeping your muscles strong and flexible.  Do not stay in bed.Resting more than 1-2 days can delay your recovery.  Pay attention to your body when you bend and lift. The most comfortable positions are those that put less stress on your recovering back. Always use proper lifting techniques, including:  Bending your knees.  Keeping the load close to your body.  Avoiding twisting.  Find a comfortable position to sleep. Use a firm mattress and lie on your side with your knees slightly bent. If you lie on your back, put a pillow under your knees.  Avoid feeling anxious or stressed.Stress increases muscle tension and can worsen back pain.It is important to recognize when you are anxious or  stressed and learn ways to manage it, such as with exercise.  Take medicines only as directed by your health care provider. Over-the-counter medicines to reduce pain and inflammation are often the most helpful.Your health care provider may prescribe muscle relaxant drugs.These medicines help dull your pain so you can more quickly return to your normal activities and healthy exercise.  Apply ice to the injured area:  Put ice in a plastic bag.  Place a towel between your skin and the bag.  Leave the ice on for 20 minutes, 2-3 times a day for the first 2-3 days. After that, ice and heat may be alternated to reduce pain and spasms.  Maintain a healthy weight. Excess weight puts extra stress on your back and makes it difficult to maintain good posture. SEEK MEDICAL CARE IF:  You have pain that is not relieved with rest or medicine.  You have increasing pain going down into the legs or buttocks.  You have pain that does not improve in one week.  You have night pain.  You lose weight.  You have a fever or chills. SEEK IMMEDIATE MEDICAL CARE IF:   You develop new bowel or bladder control problems.  You have unusual weakness or numbness in your arms or legs.  You develop nausea or vomiting.  You develop abdominal pain.  You feel faint.   This information is not intended to replace advice given to you by your health care provider. Make sure you discuss any  questions you have with your health care provider.   Document Released: 09/10/2005 Document Revised: 10/01/2014 Document Reviewed: 01/12/2014 Elsevier Interactive Patient Education [employer].

## 2015-10-05 ENCOUNTER — Ambulatory Visit: Payer: 59 | Admitting: Neurology

## 2015-10-25 ENCOUNTER — Ambulatory Visit: Payer: 59 | Attending: Neurology

## 2015-10-25 DIAGNOSIS — M2569 Stiffness of other specified joint, not elsewhere classified: Secondary | ICD-10-CM

## 2015-10-25 DIAGNOSIS — M545 Low back pain, unspecified: Secondary | ICD-10-CM

## 2015-10-25 DIAGNOSIS — R29898 Other symptoms and signs involving the musculoskeletal system: Secondary | ICD-10-CM | POA: Insufficient documentation

## 2015-10-25 DIAGNOSIS — M2560 Stiffness of unspecified joint, not elsewhere classified: Secondary | ICD-10-CM

## 2015-10-25 DIAGNOSIS — M79605 Pain in left leg: Secondary | ICD-10-CM

## 2015-10-25 NOTE — Therapy (Signed)
[facility] [address] [place], [place], [address] Phone: [phone]   Fax:  [phone]  Physical Therapy Evaluation  Patient Details  Name: Diane [name] MRN: [id number] Date of Birth: [birth year] Referring Provider: Dr. Jannifer Franklin  Encounter Date: 10/25/2015      PT End of Session - 10/25/15 2050    Visit Number 1   Number of Visits 9   Date for PT Re-Evaluation 11/24/15   Authorization Type [employer]   PT Start Time [id number]   PT Stop Time 1628   PT Time Calculation (min) 50 min   Activity Tolerance Patient limited by pain   Behavior During Therapy [facility] for tasks assessed/performed      Past Medical History  Diagnosis Date  . Crohn's disease ([place])   . PONV (postoperative nausea and vomiting)   . Pneumonia     history back in 2006  . Heart murmur     as child, never had any problems  . Anxiety   . Seasonal allergies   . Headache(784.0)     otc meds - last migraine 2009  . Low blood potassium     history  . Crohn's disease ([place])     history  . Anemia   . History of endometriosis   . Migraine without aura, with intractable migraine, so stated, without mention of status migrainosus 06/19/2013  . Vasovagal syncope   . Fibroid   . Endometriosis   . Vaginal Pap smear, abnormal   . H/O blood clots     tested positive but not located on doppler     Past Surgical History  Procedure Laterality Date  . Tonsillectomy    . Adenoidectomy    . Dilatation & currettage/hysteroscopy with resectocope N/A 06/09/2013    Procedure: DILATATION & CURETTAGE/HYSTEROSCOPY WITH HYSTEROSCOPIC RESECTION OF SUBMUCOSAL FIBROID AND ENDOMETRIAL POLYP;  Surgeon: Marvene Staff, MD;  Location: [facility];  Service: Gynecology;  Laterality: N/A;  . Laparoscopy N/A 06/09/2013    Procedure: LAPAROSCOPY DIAGNOSTIC;  Surgeon: Marvene Staff, MD;  Location: [facility];  Service: Gynecology;  Laterality: N/A;  . Robotic assisted laparoscopic  lysis of adhesion N/A 06/09/2013    Procedure: ROBOTIC ASSISTED LAPAROSCOPIC LYSIS OF ADHESION; Resection of Endometriosis and excision of fibroid;  Surgeon: Marvene Staff, MD;  Location: [facility];  Service: Gynecology;  Laterality: N/A;  . Robot assisted myomectomy N/A 02/09/2015    Procedure: MYOMECTOMY,EXCISION OF ENDOMETRIOSIS,PRE-SACRAL NEURECTOMY;  Surgeon: Governor Specking, MD;  Location: [facility];  Service: Gynecology;  Laterality: N/A;    There were no vitals filed for this visit.  Visit Diagnosis:  Low back pain radiating to left leg - Plan: PT plan of care cert/re-cert  Decreased ROM of trunk and back - Plan: PT plan of care cert/re-cert      Subjective Assessment - 10/25/15 1543    Subjective Pt reported back pain and leg pain began after 01/2015 surgery. Pt had myomectomy, excision of endometriosis lesions, and pre-sacral neurectomy. Pt reported she began having L sided hip pain (which radiates distally to left LE-front/quad area) and low back pain upon standing, she had one episode in June and then episodes began to occur more frequenty again in July. The pain episodes increased after she went back to work full-time in July. Pain increased in frequency, length, and intensity starting in August. Pt works at [employer], her job entails sitting at computer for hours at a time. Pt reports chairs are not ergonomic and supportive.  Pt has been using trashcan to elevate feet, to reduce swelling in legs (R LE >LLE). Pt describes pain as piercing and stabbing, pain is worse upon standing and movement. Pain at worst: 9-10/10 at best: 3/10. Pt reports she has not started gabapentin (prescribed by Dr. Jannifer Franklin)  because she is fearful of side effects. Pt reports she is now experiencing  N/T in B hands and occasionally in L LE (starting in quads and travelling distally to foot).   Pertinent History Crohn's disease, anxiety, endometriosis, uterine fibroids, pt has given birth to 2 children (36 and 15y/o)    Patient Stated Goals Find ways to reduce pain, or completely get rid of pain.   Currently in Pain? Yes   Pain Score 6    Pain Location Back   Pain Orientation Lower   Pain Descriptors / Indicators Aching   Pain Radiating Towards pt reports N/T in L LE   Pain Onset More than a month ago   Pain Frequency Constant   Aggravating Factors  standing, movement   Pain Relieving Factors heating pad, Ibuprofen            [facility] PT Assessment - 10/25/15 1602    Assessment   Medical Diagnosis Low back pain radiating to L LE   Referring Provider Dr. Jannifer Franklin   Onset Date/Surgical Date 02/23/15   Prior Therapy none   Precautions   Precautions None   Restrictions   Weight Bearing Restrictions No   Balance Screen   Has the patient fallen in the past 6 months No   Has the patient had a decrease in activity level because of a fear of falling?  No   Is the patient reluctant to leave their home because of a fear of falling?  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 However, L sidebending and trunk rotation caused pain proximal to L iliac crest. Pt also reported pain during back ext with overpressure. Pt's back flexion limited to 25% 2/2 pain in low back.    Strength   Overall Strength Deficits   Overall Strength  Comments R LE: hip flex: 4/5, knee ext: 5/5, knee flex: 4/5, ankle DF: 4/5. LLE: hip flex: 3+/5 with LBP, knee ext: 4/5, knee flex: 4-/5, ankle DF: 4/5. B Hip add/abd. tested grossly in seated position: 4/5.   Flexibility   Soft Tissue Assessment /Muscle Length yes   Hamstrings B decreased hamstring flexibility: pt able to maintain 180 degrees of knee ext, until hip flex. at approx. 70 degrees.   Piriformis Decreased B piriformis flexibility.   Palpation   SI assessment  Pt reported pain in B SI joint upon palpation (R>L side), PT unable to further test at  this time 2/2 pain.   Palpation comment Pt reported pain during palpation of B SI joints, B PSIS, B greater trochanter.   Special Tests    Special Tests --  In standing, B ASIS aligned evenly.   Ambulation/Gait   Ambulation/Gait Yes   Ambulation/Gait Assistance 7: Independent   Ambulation Distance (Feet) 200 Feet   Assistive device None   Gait Pattern Step-through pattern;Within Functional Limits   Ambulation Surface Level;Indoor   Gait velocity 3.33ft/sec.          Therex: Pt performed B hamstring and piriformis stretches with cues for technique. Please see pt instructions for details.                 PT Education - 10/25/15 2049    Education provided Yes   Education Details PT discussed freqency/duration and exam findings. PT educated on transferrinig to pelvic health PT Earlie Counts), as pt's low back pain could be originating from pelvic floor musculature 2//2 surgical history and symptoms. PT provided pt with stretching HEP. PT also educated pt on trying ice to reduce LBP, with 3-5 layers between skin/ice bag, for no more than 15 minutes.   Person(s) Educated Patient   Methods Explanation;Tactile cues;Verbal cues;Handout   Comprehension Returned demonstration;Verbalized understanding          PT Short Term Goals - 10/25/15 2057    PT SHORT TERM GOAL #1   Title same as LTGs           PT Long Term  Goals - 10/25/15 2057    PT LONG TERM GOAL #1   Title Pt will be IND in HEP to reduce pain and improve strength. Target date: 11/22/15   Time 4   Period Weeks   Status New   PT LONG TERM GOAL #2   Title Perform pelvic floor exam and write appropriate goals as indicated. Target date: 11/22/15   Time 4   Period Weeks   Status New   PT LONG TERM GOAL #3   Title Pt will report she is able to sit and work for >/=2 hours with pain </=4/10 in order to perform work duties. Target date: 11/22/15   Time 4   Period Weeks   Status New   PT LONG TERM GOAL #4   Title Pt will report she is able to stand and perform household duties with pain </=4/10, in order to care for children. Target date: 11/22/15   Time 4   Period Weeks   Status New   PT LONG TERM GOAL #5   Title Pt will verbalize strategies to reduce pain while at work and home, in order to perform job tasks and ADLs. Target date: 11/22/15   Time 4   Period Weeks   Status New               Plan - 10/25/15 2051    Clinical Impression Statement Pt is a pleasant 34y/o female presenting to [facility] neuro with low back pain. Pt presented with decreased strength, impaired flexibility, low back pain and leg pain, c/o N/T in L LE but light touch intact. Pt would benefit from a pelvic floor exam to determine if pelvic floor musculature is contributing to back, hip and leg pain, as pt c/o incr. pain on L side, but also experienced pain during palpation of R hip/LE.    Pt will benefit from skilled therapeutic intervention in order to improve on the following deficits Pain;Impaired flexibility;Decreased activity tolerance;Impaired sensation;Decreased strength;Other (  comment)  pt reports sensation impaired when hands/LEs are numb. Assess pelvic floor musculature.   Rehab Potential Good   Clinical Impairments Affecting Rehab Potential Hx of laparoscopy surgery in 2014 and 2016.   PT Frequency 2x / week   PT Duration 4 weeks   PT Treatment/Interventions  ADLs/Self Care Home Management;Biofeedback;Cryotherapy;Electrical Stimulation;Iontophoresis 4mg /ml Dexamethasone;Balance training;Therapeutic exercise;Manual techniques;Therapeutic activities;Stair training;Gait training;Neuromuscular re-education;Patient/family education  Pelvic floor PT   PT Next Visit Plan Assess pelvic floor musculature, hip/SI joint if pt is able to tolerate, manual therapy prn, and progress HEP prn   PT Home Exercise Plan Stretching HEP   Consulted and Agree with Plan of Care Patient         Problem List Patient Active Problem List   Diagnosis Date Noted  . Low back pain radiating to left leg 10/04/2015  . Leiomyoma 02/09/2015  . Intractable migraine without aura 06/19/2013  . Vertigo 06/13/2013  . Headache 06/13/2013  . Syncope 06/13/2013  . History of endometriosis 06/13/2013  . Menorrhagia 06/12/2013  . Hypokalemia 05/16/2011  . [place] INTESTINE 07/27/2009  . DIARRHEA 07/27/2009  . ABDOMINAL PAIN RIGHT LOWER QUADRANT 07/27/2009  . Other constipation 08/09/2008  . HEMORRHAGE OF RECTUM AND ANUS 08/09/2008  . GASTROESOPHAGEAL REFLUX DISEASE, MILD 07/12/2008  . CROHN'S DISEASE 07/12/2008    Lilyth Lawyer L 10/25/2015, 9:05 PM  [facility] [address] [place], [place], [address] Phone: [phone]   Fax:  [phone]  Name: TASHONNA SILLIMAN MRN: [id number] Date of Birth: June 23, 1981   Geoffry Paradise, PT,DPT 10/25/2015 9:05 PM Phone: [phone] Fax: [phone]

## 2015-10-25 NOTE — Patient Instructions (Signed)
HIP: Hamstrings - Short Sitting    Rest leg on raised surface. Keep knee straight. Lift chest. Hold _60__ seconds. _3__ reps per set, _2-3__ sets per day, __7_ days per week  Copyright  VHI. All rights reserved.   Piriformis Stretch, Supine    Lie supine, one ankle crossed onto opposite knee. Holding bottom leg behind knee, gently pull legs toward chest until stretch is felt in buttock of top leg. Hold _60__ seconds. For deeper stretch gently push top knee away from body.  Repeat __3_ times per session. Do _2-3__ sessions per day.  Copyright  VHI. All rights reserved.

## 2015-10-27 ENCOUNTER — Ambulatory Visit: Payer: 59 | Admitting: Physical Therapy

## 2015-11-02 ENCOUNTER — Ambulatory Visit: Payer: 59 | Attending: Neurology | Admitting: Physical Therapy

## 2015-11-02 ENCOUNTER — Encounter: Payer: Self-pay | Admitting: Physical Therapy

## 2015-11-02 DIAGNOSIS — M545 Low back pain, unspecified: Secondary | ICD-10-CM

## 2015-11-02 DIAGNOSIS — M2569 Stiffness of other specified joint, not elsewhere classified: Secondary | ICD-10-CM

## 2015-11-02 DIAGNOSIS — R29898 Other symptoms and signs involving the musculoskeletal system: Secondary | ICD-10-CM | POA: Insufficient documentation

## 2015-11-02 DIAGNOSIS — M2560 Stiffness of unspecified joint, not elsewhere classified: Secondary | ICD-10-CM

## 2015-11-02 DIAGNOSIS — M79605 Pain in left leg: Secondary | ICD-10-CM

## 2015-11-02 NOTE — Patient Instructions (Signed)
Toileting Techniques for Bowel Movements (Defecation) Using your belly (abdomen) and pelvic floor muscles to have a bowel movement is usually instinctive.  Sometimes people can have problems with these muscles and have to relearn proper defecation (emptying) techniques.  If you have weakness in your muscles, organs that are falling out, decreased sensation in your pelvis, or ignore your urge to go, you may find yourself straining to have a bowel movement.  You are straining if you are: . holding your breath or taking in a huge gulp of air and holding it  . keeping your lips and jaw tensed and closed tightly . turning red in the face because of excessive pushing or forcing . developing or worsening your  hemorrhoids . getting faint while pushing . not emptying completely and have to defecate many times a day  If you are straining, you are actually making it harder for yourself to have a bowel movement.  Many people find they are pulling up with the pelvic floor muscles and closing off instead of opening the anus. Due to lack pelvic floor relaxation and coordination the abdominal muscles, one has to work harder to push the feces out.  Many people have never been taught how to defecate efficiently and effectively.  Notice what happens to your body when you are having a bowel movement.  While you are sitting on the toilet pay attention to the following areas: . Jaw and mouth position . Angle of your hips   . Whether your feet touch the ground or not . Arm placement  . Spine position . Waist . Belly tension . Anus (opening of the anal canal)  An Evacuation/Defecation Plan   Here are the 4 basic points:  1. Lean forward enough for your elbows to rest on your knees 2. Support your feet on the floor or use a low stool if your feet don't touch the floor  3. Push out your belly as if you have swallowed a beach ball-you should feel a widening of your waist 4. Open and relax your pelvic floor muscles,  rather than tightening around the anus      The following conditions my require modifications to your toileting posture:  . If you have had surgery in the past that limits your back, hip, pelvic, knee or ankle flexibility . Constipation   Your healthcare practitioner may make the following additional suggestions and adjustments:  1) Sit on the toilet  a) Make sure your feet are supported. b) Notice your hip angle and spine position-most people find it effective to lean forward or raise their knees, which can help the muscles around the anus to relax  c) When you lean forward, place your forearms on your thighs for support  2) Relax suggestions a) Breath deeply in through your nose and out slowly through your mouth as if you are smelling the flowers and blowing out the candles. b) To become aware of how to relax your muscles, contracting and releasing muscles can be helpful.  Pull your pelvic floor muscles in tightly by using the image of holding back gas, or closing around the anus (visualize making a circle smaller) and lifting the anus up and in.  Then release the muscles and your anus should drop down and feel open. Repeat 5 times ending with the feeling of relaxation. c) Keep your pelvic floor muscles relaxed; let your belly bulge out. d) The digestive tract starts at the mouth and ends at the anal opening, so be  sure to relax both ends of the tube.  Place your tongue on the roof of your mouth with your teeth separated.  This helps relax your mouth and will help to relax the anus at the same time.  3) Empty (defecation) a) Keep your pelvic floor and sphincter relaxed, then bulge your anal muscles.  Make the anal opening wide.  b) Stick your belly out as if you have swallowed a beach ball. c) Make your belly wall hard using your belly muscles while continuing to breathe. Doing this makes it easier to open your anus. d) Breath out and give a grunt (or try using other sounds such as  ahhhh, shhhhh, ohhhh or grrrrrrr).  4) Finish a) As you finish your bowel movement, pull the pelvic floor muscles up and in.  This will leave your anus in the proper place rather than remaining pushed out and down. If you leave your anus pushed out and down, it will start to feel as though that is normal and give you incorrect signals about needing to have a bowel movement.    Earlie Counts, PT [facility] [address] [place], [place] [address]  Hook-Lying    Lie with hips and knees bent. Allow body's muscles to relax. Place hands on belly. Inhale slowly and deeply for _3__ seconds, so hands move up. Then take _3__ seconds to exhale. Repeat _5__ times. Do __1_ times a day.   Copyright  VHI. All rights reserved.  Sitting    Sit comfortably. Allow body's muscles to relax. Place hands on belly. Inhale slowly and deeply for _3__ seconds, so hands move out. Then take __3_ seconds to exhale. Repeat __5_ times. Do _2__ times a day.  Copyright  VHI. All rights reserved.   Supine With Rotation    Lie, back flat, legs bent, feet together. Rotate knees to one side. Hold _15__ seconds. Repeat to other side. Repeat __2_ times per session. Do __1_ sessions per day.  Copyright  VHI. All rights reserved.

## 2015-11-02 NOTE — Therapy (Signed)
[facility] [address], [place] [place], [place], [address] Phone: [phone]   Fax:  [phone]  Physical Therapy Treatment  Patient Details  Name: Diane [name] MRN: [id number] Date of Birth: [birth year] Referring Provider: Dr. Jannifer Franklin  Encounter Date: 11/02/2015      PT End of Session - 11/02/15 1535    Visit Number 2   Date for PT Re-Evaluation 11/24/15   Authorization Type UHC   PT Start Time 1532   PT Stop Time 1630   PT Time Calculation (min) 58 min   Activity Tolerance Patient tolerated treatment well   Behavior During Therapy [facility] for tasks assessed/performed      Past Medical History  Diagnosis Date  . Crohn's disease ([place])   . PONV (postoperative nausea and vomiting)   . Pneumonia     history back in 2006  . Heart murmur     as child, never had any problems  . Anxiety   . Seasonal allergies   . Headache(784.0)     otc meds - last migraine 2009  . Low blood potassium     history  . Crohn's disease ([place])     history  . Anemia   . History of endometriosis   . Migraine without aura, with intractable migraine, so stated, without mention of status migrainosus 06/19/2013  . Vasovagal syncope   . Fibroid   . Endometriosis   . Vaginal Pap smear, abnormal   . H/O blood clots     tested positive but not located on doppler     Past Surgical History  Procedure Laterality Date  . Tonsillectomy    . Adenoidectomy    . Dilatation & currettage/hysteroscopy with resectocope N/A 06/09/2013    Procedure: DILATATION & CURETTAGE/HYSTEROSCOPY WITH HYSTEROSCOPIC RESECTION OF SUBMUCOSAL FIBROID AND ENDOMETRIAL POLYP;  Surgeon: Marvene Staff, MD;  Location: [facility];  Service: Gynecology;  Laterality: N/A;  . Laparoscopy N/A 06/09/2013    Procedure: LAPAROSCOPY DIAGNOSTIC;  Surgeon: Marvene Staff, MD;  Location: [facility];  Service: Gynecology;  Laterality: N/A;  . Robotic assisted laparoscopic lysis of  adhesion N/A 06/09/2013    Procedure: ROBOTIC ASSISTED LAPAROSCOPIC LYSIS OF ADHESION; Resection of Endometriosis and excision of fibroid;  Surgeon: Marvene Staff, MD;  Location: [facility];  Service: Gynecology;  Laterality: N/A;  . Robot assisted myomectomy N/A 02/09/2015    Procedure: MYOMECTOMY,EXCISION OF ENDOMETRIOSIS,PRE-SACRAL NEURECTOMY;  Surgeon: Governor Specking, MD;  Location: [facility];  Service: Gynecology;  Laterality: N/A;    There were no vitals filed for this visit.  Visit Diagnosis:  Low back pain radiating to left leg  Decreased ROM of trunk and back      Subjective Assessment - 11/02/15 1536    Subjective I am not feeling well today.  No urinary leakage.    Patient Stated Goals Find ways to reduce pain, or completely get rid of pain.   Currently in Pain? Yes   Pain Score 7    Pain Location Back  hips   Pain Orientation Right;Left;Lower  left worse than right   Pain Descriptors / Indicators Aching;Throbbing;Sharp   Pain Onset More than a month ago   Pain Frequency Constant   Aggravating Factors  standing and movement   Pain Relieving Factors heating pad, ibuprofen   Multiple Pain Sites No            [facility] PT Assessment - 11/02/15 0001    Palpation   SI assessment  left ilium rotated posteriorly; sacrum rotated left   Palpation comment palpable tenderness located in bil. hip adductors, lower abdominal, upper abdominal, levaotr ani; right piriformis and gluteal                  Pelvic Floor Special Questions - 11/02/15 0001    Prior Pregnancies Yes   Number of Pregnancies 2   Number of Vaginal Deliveries 2   Currently Sexually Active No   Urinary Leakage No           [facility] Adult PT Treatment/Exercise - 11/02/15 0001    Modalities   Modalities Electrical Stimulation;Moist Heat   Moist Heat Therapy   Number Minutes Moist Heat 20 Minutes   Moist Heat Location Lumbar Spine;Other (comment)  abdominal   Electrical Stimulation    Electrical Stimulation Location lumbar and abdominal   Electrical Stimulation Action IFC   Electrical Stimulation Parameters to patient tolerance, 20 min   Electrical Stimulation Goals Pain   Manual Therapy   Manual Therapy Soft tissue mobilization;Muscle Energy Technique;Joint mobilization   Joint Mobilization correct sacrum rotated left; rotational and p-a mobilitation to L1-L5   Soft tissue mobilization abominal wall, bil. diaphragm   Muscle Energy Technique correct posteriolry rotated left ilium;                 PT Education - 11/02/15 1611    Education provided Yes   Education Details abdominal massage, diaphragmatic breathing, trunk rotation,    Person(s) Educated Patient   Methods Explanation;Demonstration;Verbal cues;Handout   Comprehension Returned demonstration;Verbalized understanding          PT Short Term Goals - 10/25/15 2057    PT SHORT TERM GOAL #1   Title same as LTGs           PT Long Term Goals - 10/25/15 2057    PT LONG TERM GOAL #1   Title Pt will be IND in HEP to reduce pain and improve strength. Target date: 11/22/15   Time 4   Period Weeks   Status New   PT LONG TERM GOAL #2   Title Perform pelvic floor exam and write appropriate goals as indicated. Target date: 11/22/15   Time 4   Period Weeks   Status New   PT LONG TERM GOAL #3   Title Pt will report she is able to sit and work for >/=2 hours with pain </=4/10 in order to perform work duties. Target date: 11/22/15   Time 4   Period Weeks   Status New   PT LONG TERM GOAL #4   Title Pt will report she is able to stand and perform household duties with pain </=4/10, in order to care for children. Target date: 11/22/15   Time 4   Period Weeks   Status New   PT LONG TERM GOAL #5   Title Pt will verbalize strategies to reduce pain while at work and home, in order to perform job tasks and ADLs. Target date: 11/22/15   Time 4   Period Weeks   Status New               Plan -  11/02/15 1612    Clinical Impression Statement Patient is a 35 year old female with lumbar and abdominal pain.  Left ilium was rotated posteriorly and sacrum rotated left.  Palpable tenderness located in bil. diphgram, lumbar, left SI area, bilateral levaotr ani.  After therapy pelvis in correct alignment.  Patient had increased bilateral diaphgram  mobility.  Patient has not met goals due to just starting therapy.  Patient will benefit form physical therapy to reduce pain and rsetor function.    Pt will benefit from skilled therapeutic intervention in order to improve on the following deficits Pain;Impaired flexibility;Decreased activity tolerance;Impaired sensation;Decreased strength;Other (comment)   Rehab Potential Good   Clinical Impairments Affecting Rehab Potential Hx of laparoscopy surgery in 2014 and 2016.   PT Frequency 2x / week   PT Duration 4 weeks   PT Treatment/Interventions ADLs/Self Care Home Management;Biofeedback;Cryotherapy;Electrical Stimulation;Iontophoresis 67m/ml Dexamethasone;Balance training;Therapeutic exercise;Manual techniques;Therapeutic activities;Stair training;Gait training;Neuromuscular re-education;Patient/family education   PT Next Visit Plan correct pelvis, soft tissue work, posture   PT Home Exercise Plan Stretching HEP   Consulted and Agree with Plan of Care Patient        Problem List Patient Active Problem List   Diagnosis Date Noted  . Low back pain radiating to left leg 10/04/2015  . Leiomyoma 02/09/2015  . Intractable migraine without aura 06/19/2013  . Vertigo 06/13/2013  . Headache 06/13/2013  . Syncope 06/13/2013  . History of endometriosis 06/13/2013  . Menorrhagia 06/12/2013  . Hypokalemia 05/16/2011  . [place] 07/27/2009  . DIARRHEA 07/27/2009  . ABDOMINAL PAIN RIGHT LOWER QUADRANT 07/27/2009  . Other constipation 08/09/2008  . HEMORRHAGE OF RECTUM AND ANUS 08/09/2008  . GASTROESOPHAGEAL REFLUX DISEASE,  MILD 07/12/2008  . CROHN'S DISEASE 07/12/2008   CEarlie Counts PT 11/02/2015 4:16 PM   [facility] [address] [place] [address]: [phone]  Fax:  [phone] Name: DVIVA GALLAHERMRN: [id number] of Birth: 4Jul 04, 1982

## 2015-11-15 ENCOUNTER — Ambulatory Visit: Payer: 59 | Admitting: Physical Therapy

## 2015-11-15 ENCOUNTER — Encounter: Payer: Self-pay | Admitting: Physical Therapy

## 2015-11-15 DIAGNOSIS — M2560 Stiffness of unspecified joint, not elsewhere classified: Secondary | ICD-10-CM

## 2015-11-15 DIAGNOSIS — M545 Low back pain, unspecified: Secondary | ICD-10-CM

## 2015-11-15 DIAGNOSIS — M79605 Pain in left leg: Secondary | ICD-10-CM

## 2015-11-15 DIAGNOSIS — M2569 Stiffness of other specified joint, not elsewhere classified: Secondary | ICD-10-CM

## 2015-11-15 NOTE — Patient Instructions (Signed)
Side Stretch    With feet shoulder width apart, hands on hips, inhale. Then exhale while bending directly to side, keeping top arm outstretched. Hold 1 count. Slowly return to starting position. Repeat to other side. Hold 15 sec Repeat __2__ times, each side.  Copyright  VHI. All rights reserved.  [place] [address], [place], [place] [address] Phone # [phone] Fax [phone]

## 2015-11-15 NOTE — Therapy (Signed)
[facility] [address], [place], [place], [address] Phone: [phone]   Fax:  [phone]  Physical Therapy Treatment  Patient Details  Name: Diane [name] MRN: [id number] Date of Birth: [birth year] Referring Provider: Dr. Jannifer Franklin  Encounter Date: 11/15/2015      PT End of Session - 11/15/15 0856    Visit Number 3   Date for PT Re-Evaluation 11/24/15   Authorization Type UHC   PT Start Time [phone]  patient came late   PT Stop Time 0928   PT Time Calculation (min) 32 min   Activity Tolerance Patient tolerated treatment well   Behavior During Therapy [facility] for tasks assessed/performed      Past Medical History  Diagnosis Date  . Crohn's disease ([place])   . PONV (postoperative nausea and vomiting)   . Pneumonia     history back in 2006  . Heart murmur     as child, never had any problems  . Anxiety   . Seasonal allergies   . Headache(784.0)     otc meds - last migraine 2009  . Low blood potassium     history  . Crohn's disease ([place])     history  . Anemia   . History of endometriosis   . Migraine without aura, with intractable migraine, so stated, without mention of status migrainosus 06/19/2013  . Vasovagal syncope   . Fibroid   . Endometriosis   . Vaginal Pap smear, abnormal   . H/O blood clots     tested positive but not located on doppler     Past Surgical History  Procedure Laterality Date  . Tonsillectomy    . Adenoidectomy    . Dilatation & currettage/hysteroscopy with resectocope N/A 06/09/2013    Procedure: DILATATION & CURETTAGE/HYSTEROSCOPY WITH HYSTEROSCOPIC RESECTION OF SUBMUCOSAL FIBROID AND ENDOMETRIAL POLYP;  Surgeon: Marvene Staff, MD;  Location: [facility];  Service: Gynecology;  Laterality: N/A;  . Laparoscopy N/A 06/09/2013    Procedure: LAPAROSCOPY DIAGNOSTIC;  Surgeon: Marvene Staff, MD;  Location: [facility];  Service: Gynecology;  Laterality: N/A;  . Robotic assisted  laparoscopic lysis of adhesion N/A 06/09/2013    Procedure: ROBOTIC ASSISTED LAPAROSCOPIC LYSIS OF ADHESION; Resection of Endometriosis and excision of fibroid;  Surgeon: Marvene Staff, MD;  Location: [facility];  Service: Gynecology;  Laterality: N/A;  . Robot assisted myomectomy N/A 02/09/2015    Procedure: MYOMECTOMY,EXCISION OF ENDOMETRIOSIS,PRE-SACRAL NEURECTOMY;  Surgeon: Governor Specking, MD;  Location: [facility];  Service: Gynecology;  Laterality: N/A;    There were no vitals filed for this visit.  Visit Diagnosis:  Low back pain radiating to left leg  Decreased ROM of trunk and back      Subjective Assessment - 11/15/15 0857    Subjective I was sore afterwards.  I feel the heat helped.  the constpation is better. The handouts have helped. I feel at work yesterday so I am sore.    Pertinent History Crohn's disease, anxiety, endometriosis, uterine fibroids, pt has given birth to 2 children (8 and 15y/o)   Patient Stated Goals Find ways to reduce pain, or completely get rid of pain.   Currently in Pain? Yes   Pain Score 7    Pain Location Back  left leg   Pain Orientation Right;Left;Lower   Pain Descriptors / Indicators Aching;Sore   Pain Type Chronic pain   Pain Onset More than a month ago   Pain Frequency Constant   Aggravating Factors  standing and movement   Pain Relieving Factors heating pad, ibprofen   Multiple Pain Sites No                         [facility] Adult PT Treatment/Exercise - 11/15/15 0001    Modalities   Modalities Ultrasound   Ultrasound   Ultrasound Location left thoracic lumbar area in prone   Ultrasound Parameters 100%, 1.2 w/cm2, 8 min, 1 mhz   Ultrasound Goals Pain   Manual Therapy   Manual Therapy Soft tissue mobilization;Joint mobilization   Joint Mobilization P-=a and rotational mobilization  grade 3, bil. posterior rib mobi   Soft tissue mobilization left thoracic and lumbar paraspinals, left quadratus                 PT Education - 11/15/15 0925    Education provided Yes   Education Details left side stretch   Person(s) Educated Patient   Methods Explanation;Demonstration;Verbal cues;Handout   Comprehension Returned demonstration;Verbalized understanding          PT Short Term Goals - 10/25/15 2057    PT SHORT TERM GOAL #1   Title same as LTGs           PT Long Term Goals - 11/15/15 0900    PT LONG TERM GOAL #1   Title Pt will be IND in HEP to reduce pain and improve strength. Target date: 11/22/15   Time 4   Period Weeks   Status On-going   PT LONG TERM GOAL #2   Title Perform pelvic floor exam and write appropriate goals as indicated. Target date: 11/22/15   Time 4   Period Weeks   Status New   PT LONG TERM GOAL #3   Title Pt will report she is able to sit and work for >/=2 hours with pain </=4/10 in order to perform work duties. Target date: 11/22/15   Time 4   Period Weeks   Status New   PT LONG TERM GOAL #4   Title Pt will report she is able to stand and perform household duties with pain </=4/10, in order to care for children. Target date: 11/22/15   Time 4   Period Weeks   Status New   PT LONG TERM GOAL #5   Title Pt will verbalize strategies to reduce pain while at work and home, in order to perform job tasks and ADLs. Target date: 11/22/15   Time 4   Period Weeks   Status New               Plan - 11/15/15 0929    Clinical Impression Statement Patient is a 35 year old female with lumbar and abdominal pain.  Patient reports her constipation has improved with her HEP.  Patient fell at work tripping over a curb yesterday  and hurt her back and left knee.  Patient has a muslce spasm on the left side of T8-T10 and decreased mobility of thorcic vertebrae. patient  pelvic lfoor in good alignment.  Patient has not met goasl due to falling at work.  Patient will benefit from physical therapy to reduce pain and improve function.    Pt will benefit from skilled therapeutic  intervention in order to improve on the following deficits Pain;Impaired flexibility;Decreased activity tolerance;Impaired sensation;Decreased strength;Other (comment)   Rehab Potential Good   Clinical Impairments Affecting Rehab Potential Hx of laparoscopy surgery in 2014 and 2016.   PT Frequency 2x / week  PT Duration 4 weeks   PT Treatment/Interventions ADLs/Self Care Home Management;Biofeedback;Cryotherapy;Electrical Stimulation;Iontophoresis '4mg'$ /ml Dexamethasone;Balance training;Therapeutic exercise;Manual techniques;Therapeutic activities;Stair training;Gait training;Neuromuscular re-education;Patient/family education   PT Next Visit Plan osft tissue work, stretching   PT Home Exercise Plan Stretching HEP   Consulted and Agree with Plan of Care Patient        Problem List Patient Active Problem List   Diagnosis Date Noted  . Low back pain radiating to left leg 10/04/2015  . Leiomyoma 02/09/2015  . Intractable migraine without aura 06/19/2013  . Vertigo 06/13/2013  . Headache 06/13/2013  . Syncope 06/13/2013  . History of endometriosis 06/13/2013  . Menorrhagia 06/12/2013  . Hypokalemia 05/16/2011  . [place] INTESTINE 07/27/2009  . DIARRHEA 07/27/2009  . ABDOMINAL PAIN RIGHT LOWER QUADRANT 07/27/2009  . Other constipation 08/09/2008  . HEMORRHAGE OF RECTUM AND ANUS 08/09/2008  . GASTROESOPHAGEAL REFLUX DISEASE, MILD 07/12/2008  . CROHN'S DISEASE 07/12/2008    Earlie Counts, PT 11/15/2015 9:32 AM   [facility] [address] [place], [place], [address] Phone: [phone]   Fax:  [phone]  Name: JAMELA CUMBO MRN: [id number] Date of Birth: Aug 21, 1981

## 2015-11-17 ENCOUNTER — Ambulatory Visit: Payer: 59 | Admitting: Physical Therapy

## 2015-11-24 ENCOUNTER — Ambulatory Visit: Payer: 59 | Attending: Neurology | Admitting: Physical Therapy

## 2015-11-24 ENCOUNTER — Encounter: Payer: Self-pay | Admitting: Physical Therapy

## 2015-11-24 DIAGNOSIS — M79605 Pain in left leg: Secondary | ICD-10-CM

## 2015-11-24 DIAGNOSIS — M2569 Stiffness of other specified joint, not elsewhere classified: Secondary | ICD-10-CM

## 2015-11-24 DIAGNOSIS — N949 Unspecified condition associated with female genital organs and menstrual cycle: Secondary | ICD-10-CM | POA: Insufficient documentation

## 2015-11-24 DIAGNOSIS — M545 Low back pain, unspecified: Secondary | ICD-10-CM

## 2015-11-24 DIAGNOSIS — R29898 Other symptoms and signs involving the musculoskeletal system: Secondary | ICD-10-CM | POA: Diagnosis present

## 2015-11-24 DIAGNOSIS — M2560 Stiffness of unspecified joint, not elsewhere classified: Secondary | ICD-10-CM

## 2015-11-24 DIAGNOSIS — R102 Pelvic and perineal pain: Secondary | ICD-10-CM

## 2015-11-24 NOTE — Therapy (Signed)
[facility] [address], [place], [place], [address] Phone: [phone]   Fax:  [phone]  Physical Therapy Treatment  Patient Details  Name: Diane [name] MRN: [id number] Date of Birth: 09-06-1981 Referring Provider: Dr. Margette Fast  Encounter Date: 11/24/2015      PT End of Session - 11/24/15 0928    Visit Number 4   Date for PT Re-Evaluation 01/19/16   Authorization Type UHC   PT Start Time 0846   PT Stop Time 0925   PT Time Calculation (min) 39 min   Activity Tolerance Patient tolerated treatment well   Behavior During Therapy [facility] for tasks assessed/performed      Past Medical History  Diagnosis Date  . Crohn's disease ([place])   . PONV (postoperative nausea and vomiting)   . Pneumonia     history back in 2006  . Heart murmur     as child, never had any problems  . Anxiety   . Seasonal allergies   . Headache(784.0)     otc meds - last migraine 2009  . Low blood potassium     history  . Crohn's disease ([place])     history  . Anemia   . History of endometriosis   . Migraine without aura, with intractable migraine, so stated, without mention of status migrainosus 06/19/2013  . Vasovagal syncope   . Fibroid   . Endometriosis   . Vaginal Pap smear, abnormal   . H/O blood clots     tested positive but not located on doppler     Past Surgical History  Procedure Laterality Date  . Tonsillectomy    . Adenoidectomy    . Dilatation & currettage/hysteroscopy with resectocope N/A 06/09/2013    Procedure: DILATATION & CURETTAGE/HYSTEROSCOPY WITH HYSTEROSCOPIC RESECTION OF SUBMUCOSAL FIBROID AND ENDOMETRIAL POLYP;  Surgeon: Marvene Staff, MD;  Location: [facility];  Service: Gynecology;  Laterality: N/A;  . Laparoscopy N/A 06/09/2013    Procedure: LAPAROSCOPY DIAGNOSTIC;  Surgeon: Marvene Staff, MD;  Location: [facility];  Service: Gynecology;  Laterality: N/A;  . Robotic assisted laparoscopic lysis  of adhesion N/A 06/09/2013    Procedure: ROBOTIC ASSISTED LAPAROSCOPIC LYSIS OF ADHESION; Resection of Endometriosis and excision of fibroid;  Surgeon: Marvene Staff, MD;  Location: [facility];  Service: Gynecology;  Laterality: N/A;  . Robot assisted myomectomy N/A 02/09/2015    Procedure: MYOMECTOMY,EXCISION OF ENDOMETRIOSIS,PRE-SACRAL NEURECTOMY;  Surgeon: Governor Specking, MD;  Location: [facility];  Service: Gynecology;  Laterality: N/A;    There were no vitals filed for this visit.  Visit Diagnosis:  Low back pain radiating to left leg - Plan: PT plan of care cert/re-cert  Decreased ROM of trunk and back - Plan: PT plan of care cert/re-cert  Weakness of left hip - Plan: PT plan of care cert/re-cert  Perineal pain in female - Plan: PT plan of care cert/re-cert      Subjective Assessment - 11/24/15 0848    Subjective I had increased pain in back on Sunday and I think it from my fall.    Pertinent History Crohn's disease, anxiety, endometriosis, uterine fibroids, pt has given birth to 2 children (8 and 15y/o)   Patient Stated Goals Find ways to reduce pain, or completely get rid of pain.   Currently in Pain? Yes   Pain Score 5    Pain Location Back   Pain Orientation Right;Left;Lower   Pain Descriptors / Indicators Aching;Sore   Pain Type Chronic  pain   Pain Radiating Towards shooting down left leg   Pain Onset More than a month ago   Aggravating Factors  standing and movement   Pain Relieving Factors heating pad, Ibprofen   Multiple Pain Sites No            [facility] PT Assessment - 11/24/15 0001    Assessment   Medical Diagnosis Low back pain radiating to L LE   Referring Provider Dr. Margette Fast   Onset Date/Surgical Date 02/23/15   Prior Therapy none   Precautions   Precautions None   Restrictions   Weight Bearing Restrictions No   Balance Screen   Has the patient fallen in the past 6 months Yes  tripped over a curb walking to work   How many times? 1   Has the  patient had a decrease in activity level because of a fear of falling?  No   Is the patient reluctant to leave their home because of a fear of falling?  No   Prior Function   Level of Independence Independent   Cognition   Overall Cognitive Status Within Functional Limits for tasks assessed   ROM / Strength   AROM / PROM / Strength AROM;Strength   AROM   AROM Assessment Site Lumbar   Lumbar Flexion decreased by 50%   Lumbar Extension decreased by 25%   Lumbar - Right Side Bend decreased by 50%   Lumbar - Left Side Bend full   Lumbar - Right Rotation decreased by 255   Lumbar - Left Rotation full   Strength   Left Hip Extension 3/5   Left Hip ABduction 3/5   Palpation   Spinal mobility L4-5 decreased P-A mobiility, L2-L3 rotated left   SI assessment  left ilium rotated posteriorly; sacrum rotated left   Palpation comment tenderness located in left obturator internist, abdominal muscles, left levator ani, left gluteal, left quadratus, left hamstring and hip adductors                  Pelvic Floor Special Questions - 11/24/15 0001    Currently Sexually Active No   Urinary Leakage No           [facility] Adult PT Treatment/Exercise - 11/24/15 0001    Manual Therapy   Manual Therapy Soft tissue mobilization;Joint mobilization   Joint Mobilization P-A and rotational mobilization to L3-L5; left lower rib cage mobilization to open up ribs; sacral mobilization to correct left rotation   Soft tissue mobilization left quadratus, left gluteal, left levaotr ani, left obturator internist, left SI joint, left hamstring                PT Education - 11/24/15 0623    Education provided No          PT Short Term Goals - 10/25/15 2057    PT SHORT TERM GOAL #1   Title same as LTGs           PT Long Term Goals - 11/24/15 0929    PT LONG TERM GOAL #1   Title Pt will be IND in HEP to reduce pain and improve strength.    Time 6   Period Weeks   Status New   PT  LONG TERM GOAL #2   Title Perform pelvic floor exam and write appropriate goals as indicated. Target date: 11/22/15   Time 4   Period Weeks   Status Achieved   PT LONG TERM GOAL #3   Title  Pt will report she is able to sit and work for >/=2 hours with pain </=4/10 in order to perform work duties.    Time 6   Period Weeks   Status New   PT LONG TERM GOAL #4   Title Pt will report she is able to stand and perform household duties with pain </=4/10, in order to care for children.   Time 6   Period Weeks   Status New   PT LONG TERM GOAL #5   Title Pt will verbalize strategies to reduce pain while at work and home, in order to perform job tasks and ADLs.    Time 6   Period Weeks   Status New   Additional Long Term Goals   Additional Long Term Goals Yes   PT LONG TERM GOAL #6   Title pain with walking decreased >/= 75%    Time 6   Period Weeks   Status New               Plan - 11/24/15 1319    Clinical Impression Statement Patient is a 35 year old female with lumbar and abdominal pain. Patient continues to have progress with her constipation.  Patient fell 2 week ago at work tripping on a curb which increased her lumbar and left leg pain. Patient has left ilium rotation ,sacrum rotated left, L2-3 rotated left and ddecreased mobility of L4-L5.  Palpable tenderness located in abdominal, left gluteal, left pelvic floor, left quadratus, left lower rib cage, Patient has decreased lumbar ROM, and weakness in left hip abduction and extension.  Patient has pain with sitting, walking and standing.  Paitent would benefitfrom physical therapy to reduce pain and improve function.    Pt will benefit from skilled therapeutic intervention in order to improve on the following deficits Pain;Impaired flexibility;Decreased activity tolerance;Impaired sensation;Decreased strength;Other (comment)   Rehab Potential Good   Clinical Impairments Affecting Rehab Potential Hx of laparoscopy surgery in 2014  and 2016.   PT Duration 8 weeks   PT Treatment/Interventions ADLs/Self Care Home Management;Biofeedback;Cryotherapy;Electrical Stimulation;Iontophoresis 35m/ml Dexamethasone;Balance training;Therapeutic exercise;Manual techniques;Therapeutic activities;Stair training;Gait training;Neuromuscular re-education;Patient/family education;Ultrasound;Moist Heat;Traction   PT Next Visit Plan soft tissue work, correct pelvis, stretches   PT Home Exercise Plan Stretching HEP   Consulted and Agree with Plan of Care Patient        Problem List Patient Active Problem List   Diagnosis Date Noted  . Low back pain radiating to left leg 10/04/2015  . Leiomyoma 02/09/2015  . Intractable migraine without aura 06/19/2013  . Vertigo 06/13/2013  . Headache 06/13/2013  . Syncope 06/13/2013  . History of endometriosis 06/13/2013  . Menorrhagia 06/12/2013  . Hypokalemia 05/16/2011  . [place] 07/27/2009  . DIARRHEA 07/27/2009  . ABDOMINAL PAIN RIGHT LOWER QUADRANT 07/27/2009  . Other constipation 08/09/2008  . HEMORRHAGE OF RECTUM AND ANUS 08/09/2008  . GASTROESOPHAGEAL REFLUX DISEASE, MILD 07/12/2008  . CROHN'S DISEASE 07/12/2008    CEarlie Counts PT 11/24/2015 1:29 PM    [facility] [address] [place] [address]Phone: [phone]  Fax:  [phone] Name: Diane TUMLINMRN: [id number] of Birth: 402/09/1980

## 2015-11-28 ENCOUNTER — Encounter: Payer: Self-pay | Admitting: Physical Therapy

## 2015-11-28 ENCOUNTER — Ambulatory Visit: Payer: 59 | Admitting: Physical Therapy

## 2015-11-28 DIAGNOSIS — R102 Pelvic and perineal pain: Secondary | ICD-10-CM

## 2015-11-28 DIAGNOSIS — M545 Low back pain, unspecified: Secondary | ICD-10-CM

## 2015-11-28 DIAGNOSIS — M2569 Stiffness of other specified joint, not elsewhere classified: Secondary | ICD-10-CM

## 2015-11-28 DIAGNOSIS — N949 Unspecified condition associated with female genital organs and menstrual cycle: Secondary | ICD-10-CM

## 2015-11-28 DIAGNOSIS — M2560 Stiffness of unspecified joint, not elsewhere classified: Secondary | ICD-10-CM

## 2015-11-28 DIAGNOSIS — M79605 Pain in left leg: Secondary | ICD-10-CM

## 2015-11-28 DIAGNOSIS — R29898 Other symptoms and signs involving the musculoskeletal system: Secondary | ICD-10-CM

## 2015-11-28 NOTE — Therapy (Signed)
[facility] [address], [place], [place], [address] Phone: [phone]   Fax:  [phone]  Physical Therapy Treatment  Patient Details  Name: Diane [name] MRN: [id number] Date of Birth: March 28, 1981 Referring Provider: Dr. Margette Fast  Encounter Date: 11/28/2015      PT End of Session - 11/28/15 0853    Visit Number 5   Date for PT Re-Evaluation 01/19/16   Authorization Type UHC   PT Start Time 0848   PT Stop Time 0928   PT Time Calculation (min) 40 min   Activity Tolerance Patient tolerated treatment well   Behavior During Therapy [facility] for tasks assessed/performed      Past Medical History  Diagnosis Date  . Crohn's disease ([place])   . PONV (postoperative nausea and vomiting)   . Pneumonia     history back in 2006  . Heart murmur     as child, never had any problems  . Anxiety   . Seasonal allergies   . Headache(784.0)     otc meds - last migraine 2009  . Low blood potassium     history  . Crohn's disease ([place])     history  . Anemia   . History of endometriosis   . Migraine without aura, with intractable migraine, so stated, without mention of status migrainosus 06/19/2013  . Vasovagal syncope   . Fibroid   . Endometriosis   . Vaginal Pap smear, abnormal   . H/O blood clots     tested positive but not located on doppler     Past Surgical History  Procedure Laterality Date  . Tonsillectomy    . Adenoidectomy    . Dilatation & currettage/hysteroscopy with resectocope N/A 06/09/2013    Procedure: DILATATION & CURETTAGE/HYSTEROSCOPY WITH HYSTEROSCOPIC RESECTION OF SUBMUCOSAL FIBROID AND ENDOMETRIAL POLYP;  Surgeon: Marvene Staff, MD;  Location: [facility];  Service: Gynecology;  Laterality: N/A;  . Laparoscopy N/A 06/09/2013    Procedure: LAPAROSCOPY DIAGNOSTIC;  Surgeon: Marvene Staff, MD;  Location: [facility];  Service: Gynecology;  Laterality: N/A;  . Robotic assisted laparoscopic lysis  of adhesion N/A 06/09/2013    Procedure: ROBOTIC ASSISTED LAPAROSCOPIC LYSIS OF ADHESION; Resection of Endometriosis and excision of fibroid;  Surgeon: Marvene Staff, MD;  Location: [facility];  Service: Gynecology;  Laterality: N/A;  . Robot assisted myomectomy N/A 02/09/2015    Procedure: MYOMECTOMY,EXCISION OF ENDOMETRIOSIS,PRE-SACRAL NEURECTOMY;  Surgeon: Governor Specking, MD;  Location: [facility];  Service: Gynecology;  Laterality: N/A;    There were no vitals filed for this visit.  Visit Diagnosis:  Low back pain radiating to left leg  Weakness of left hip  Decreased ROM of trunk and back  Perineal pain in female      Subjective Assessment - 11/28/15 0850    Subjective 2 days after therapy I felt a little better.  This morning hurting.    Pertinent History Crohn's disease, anxiety, endometriosis, uterine fibroids, pt has given birth to 2 children (8 and 15y/o)   Patient Stated Goals Find ways to reduce pain, or completely get rid of pain.   Currently in Pain? Yes   Pain Score 7    Pain Location Back   Pain Orientation Right;Lower   Pain Descriptors / Indicators Aching;Sore;Throbbing   Pain Type Chronic pain   Pain Onset More than a month ago   Pain Frequency Constant   Aggravating Factors  bending, lifting, movement of back   Pain Relieving Factors  sitting in one position, heating pad, Ibprofen; sitting erect   Multiple Pain Sites No            [facility] PT Assessment - 11/28/15 0001    Palpation   SI assessment  pelvis in correct alignment                     [facility] Adult PT Treatment/Exercise - 11/28/15 0001    Modalities   Modalities Electrical Stimulation;Ultrasound   Electrical Stimulation   Electrical Stimulation Location lumbar bil.    Pharmacist, hospital Parameters to tolerance, 8 min   Electrical Stimulation Goals Pain   Ultrasound   Ultrasound Location bl. lumbar paraspinals   Ultrasound Parameters  100%, 84mz, 1.2 w/cm2, 8 min   Ultrasound Goals Pain   Manual Therapy   Manual Therapy Soft tissue mobilization   Joint Mobilization P-A and rotational mobilization to L3-L5; left lower rib cage mobilization to open up ribs; sacral mobilization to correct left rotation                PT Education - 11/28/15 0928    Education provided Yes   Education Details body mechanics to decrease strain on lumbar   Person(s) Educated Patient   Methods Explanation;Demonstration;Verbal cues;Handout   Comprehension Returned demonstration;Verbalized understanding          PT Short Term Goals - 10/25/15 2057    PT SHORT TERM GOAL #1   Title same as LTGs           PT Long Term Goals - 11/28/15 05465   PT LONG TERM GOAL #1   Title Pt will be IND in HEP to reduce pain and improve strength.    Time 6   Period Weeks   Status On-going   PT LONG TERM GOAL #2   Title Perform pelvic floor exam and write appropriate goals as indicated. Target date: 11/22/15   Time 4   Period Weeks   Status Achieved   PT LONG TERM GOAL #3   Title Pt will report she is able to sit and work for >/=2 hours with pain </=4/10 in order to perform work duties.    Time 6   Period Weeks   Status On-going   PT LONG TERM GOAL #4   Title Pt will report she is able to stand and perform household duties with pain </=4/10, in order to care for children.   Time 6   Period Weeks   Status On-going   PT LONG TERM GOAL #5   Title Pt will verbalize strategies to reduce pain while at work and home, in order to perform job tasks and ADLs.    Time 6   Period Weeks   Status On-going   PT LONG TERM GOAL #6   Title pain with walking decreased >/= 75%    Time 6   Period Weeks   Status On-going               Plan - 11/28/15 0929    Clinical Impression Statement Patient is a 35year old female with lumbar and abdominal pain. Patient has not met goals due to increased pain from her fall.  After therapy patient has  no pain and pelvis in correct alignment.  Patient would benefit form physical therapy to reduce pain and improve function.    Pt will benefit from skilled therapeutic intervention in order to improve on the following deficits Pain;Impaired  flexibility;Decreased activity tolerance;Impaired sensation;Decreased strength;Other (comment)   Rehab Potential Good   Clinical Impairments Affecting Rehab Potential Hx of laparoscopy surgery in 2014 and 2016.   PT Frequency 2x / week   PT Duration 8 weeks   PT Treatment/Interventions ADLs/Self Care Home Management;Biofeedback;Cryotherapy;Electrical Stimulation;Iontophoresis 71m/ml Dexamethasone;Balance training;Therapeutic exercise;Manual techniques;Therapeutic activities;Stair training;Gait training;Neuromuscular re-education;Patient/family education;Ultrasound;Moist Heat;Traction   PT Next Visit Plan soft tissue work, correct pelvis, stretches; back stabilization exercises   PT Home Exercise Plan progress as needed   Consulted and Agree with Plan of Care Patient        Problem List Patient Active Problem List   Diagnosis Date Noted  . Low back pain radiating to left leg 10/04/2015  . Leiomyoma 02/09/2015  . Intractable migraine without aura 06/19/2013  . Vertigo 06/13/2013  . Headache 06/13/2013  . Syncope 06/13/2013  . History of endometriosis 06/13/2013  . Menorrhagia 06/12/2013  . Hypokalemia 05/16/2011  . [place]TESTINE 07/27/2009  . DIARRHEA 07/27/2009  . ABDOMINAL PAIN RIGHT LOWER QUADRANT 07/27/2009  . Other constipation 08/09/2008  . HEMORRHAGE OF RECTUM AND ANUS 08/09/2008  . GASTROESOPHAGEAL REFLUX DISEASE, MILD 07/12/2008  . CROHN'S DISEASE 07/12/2008    CEarlie Counts PT 11/28/2015 9:31 AM   [facility] [address] [place] [address]Phone: [phone]  Fax:  [phone] Name: Diane TILLEYMRN: [id number] of Birth:  411/15/1982

## 2015-11-28 NOTE — Patient Instructions (Signed)
Sleeping on Back    Place pillow under knees. A pillow with cervical support and a roll around waist are also helpful.   Copyright  VHI. All rights reserved.  Sleeping on Side    Place pillow between knees. Use cervical support under neck and a roll around waist as needed.   Copyright  VHI. All rights reserved.  Sleeping on Stomach    If this is the only desirable sleeping position, place pillow under lower legs, and under stomach or chest as needed. Pillow under stomach.   Copyright  VHI. All rights reserved.  Sleeping on Stomach    If this is the only desirable sleeping position, place pillow under lower legs, and under stomach or chest as needed.   Copyright  VHI. All rights reserved.  Computer Work    Position work to Programmer, multimedia. Use proper work and seat height. Keep shoulders back and down, wrists straight, and elbows at right angles. Use chair that provides full back support. Add footrest and lumbar roll as needed. Knees slightly higher than hips.    Copyright  VHI. All rights reserved.  Avoid Twisting    Avoid twisting or bending back. Pivot around using foot movements, and bend at knees if needed when reaching for articles.   Copyright  VHI. All rights reserved.  Log Roll    Lying on back, bend left knee and place left arm across chest. Roll all in one movement to the right. Reverse to roll to the left. Always move as one unit.   Copyright  VHI. All rights reserved.  Bending    Bend at hips and knees, not back. Keep feet shoulder-width apart.   Copyright  VHI. All rights reserved.  [facility] [address], [place], [place] [address] Phone # [phone] Fax [phone]

## 2015-11-29 ENCOUNTER — Encounter: Payer: 59 | Admitting: Physical Therapy

## 2015-11-30 ENCOUNTER — Encounter: Payer: 59 | Admitting: Physical Therapy

## 2015-12-01 ENCOUNTER — Ambulatory Visit: Payer: 59 | Admitting: Physical Therapy

## 2015-12-01 ENCOUNTER — Encounter: Payer: Self-pay | Admitting: Physical Therapy

## 2015-12-01 DIAGNOSIS — M545 Low back pain, unspecified: Secondary | ICD-10-CM

## 2015-12-01 DIAGNOSIS — M79605 Pain in left leg: Secondary | ICD-10-CM

## 2015-12-01 DIAGNOSIS — N949 Unspecified condition associated with female genital organs and menstrual cycle: Secondary | ICD-10-CM

## 2015-12-01 DIAGNOSIS — M2560 Stiffness of unspecified joint, not elsewhere classified: Secondary | ICD-10-CM

## 2015-12-01 DIAGNOSIS — M2569 Stiffness of other specified joint, not elsewhere classified: Secondary | ICD-10-CM

## 2015-12-01 DIAGNOSIS — R102 Pelvic and perineal pain: Secondary | ICD-10-CM

## 2015-12-01 DIAGNOSIS — R29898 Other symptoms and signs involving the musculoskeletal system: Secondary | ICD-10-CM

## 2015-12-01 NOTE — Therapy (Signed)
[facility] [address], [place], [place], [address] Phone: [phone]   Fax:  [phone]  Physical Therapy Treatment  Patient Details  Name: Diane [name] MRN: [id number] Date of Birth: 02/24/1981 Referring Provider: Dr. Margette Fast  Encounter Date: 12/01/2015      PT End of Session - 12/01/15 0851    Visit Number 6   Date for PT Re-Evaluation 01/19/16   Authorization Type UHC   PT Start Time 0848   PT Stop Time 0928   PT Time Calculation (min) 40 min   Activity Tolerance Patient tolerated treatment well   Behavior During Therapy [facility] for tasks assessed/performed      Past Medical History  Diagnosis Date  . Crohn's disease ([place])   . PONV (postoperative nausea and vomiting)   . Pneumonia     history back in 2006  . Heart murmur     as child, never had any problems  . Anxiety   . Seasonal allergies   . Headache(784.0)     otc meds - last migraine 2009  . Low blood potassium     history  . Crohn's disease ([place])     history  . Anemia   . History of endometriosis   . Migraine without aura, with intractable migraine, so stated, without mention of status migrainosus 06/19/2013  . Vasovagal syncope   . Fibroid   . Endometriosis   . Vaginal Pap smear, abnormal   . H/O blood clots     tested positive but not located on doppler     Past Surgical History  Procedure Laterality Date  . Tonsillectomy    . Adenoidectomy    . Dilatation & currettage/hysteroscopy with resectocope N/A 06/09/2013    Procedure: DILATATION & CURETTAGE/HYSTEROSCOPY WITH HYSTEROSCOPIC RESECTION OF SUBMUCOSAL FIBROID AND ENDOMETRIAL POLYP;  Surgeon: Marvene Staff, MD;  Location: [facility];  Service: Gynecology;  Laterality: N/A;  . Laparoscopy N/A 06/09/2013    Procedure: LAPAROSCOPY DIAGNOSTIC;  Surgeon: Marvene Staff, MD;  Location: [facility];  Service: Gynecology;  Laterality: N/A;  . Robotic assisted laparoscopic lysis  of adhesion N/A 06/09/2013    Procedure: ROBOTIC ASSISTED LAPAROSCOPIC LYSIS OF ADHESION; Resection of Endometriosis and excision of fibroid;  Surgeon: Marvene Staff, MD;  Location: [facility];  Service: Gynecology;  Laterality: N/A;  . Robot assisted myomectomy N/A 02/09/2015    Procedure: MYOMECTOMY,EXCISION OF ENDOMETRIOSIS,PRE-SACRAL NEURECTOMY;  Surgeon: Governor Specking, MD;  Location: [facility];  Service: Gynecology;  Laterality: N/A;    There were no vitals filed for this visit.  Visit Diagnosis:  Low back pain radiating to left leg  Weakness of left hip  Decreased ROM of trunk and back  Perineal pain in female      Subjective Assessment - 12/01/15 0851    Subjective I may have to stop my appointments and not sure when benefits start.  My back feels better.  I am able to sit up, walk around and lay down.  Pain is worse when at work.    Pertinent History Crohn's disease, anxiety, endometriosis, uterine fibroids, pt has given birth to 2 children (8 and 15y/o)   Patient Stated Goals Find ways to reduce pain, or completely get rid of pain.   Currently in Pain? No/denies            [facility] PT Assessment - 12/01/15 0001    Palpation   SI assessment  pelvis in correct alignment  [facility] Adult PT Treatment/Exercise - 12/01/15 0001    Manual Therapy   Manual Therapy Passive ROM;Joint mobilization   Manual therapy comments Manually stretched bilateral hip rotators, quads   Joint Mobilization P-A and rotational mobilization to L3-L5; left lower rib cage mobilization to open up ribs; sacral mobilization to correct left rotation                PT Education - 12/01/15 0913    Education provided Yes   Education Details lumbar stabilization exercises   Person(s) Educated Patient   Methods Explanation;Demonstration;Verbal cues;Handout   Comprehension Returned demonstration;Verbalized understanding          PT Short Term Goals - 10/25/15  2057    PT SHORT TERM GOAL #1   Title same as LTGs           PT Long Term Goals - 11/28/15 [id number]    PT LONG TERM GOAL #1   Title Pt will be IND in HEP to reduce pain and improve strength.    Time 6   Period Weeks   Status On-going   PT LONG TERM GOAL #2   Title Perform pelvic floor exam and write appropriate goals as indicated. Target date: 11/22/15   Time 4   Period Weeks   Status Achieved   PT LONG TERM GOAL #3   Title Pt will report she is able to sit and work for >/=2 hours with pain </=4/10 in order to perform work duties.    Time 6   Period Weeks   Status On-going   PT LONG TERM GOAL #4   Title Pt will report she is able to stand and perform household duties with pain </=4/10, in order to care for children.   Time 6   Period Weeks   Status On-going   PT LONG TERM GOAL #5   Title Pt will verbalize strategies to reduce pain while at work and home, in order to perform job tasks and ADLs.    Time 6   Period Weeks   Status On-going   PT LONG TERM GOAL #6   Title pain with walking decreased >/= 75%    Time 6   Period Weeks   Status On-going               Plan - 12/01/15 0921    Clinical Impression Statement Patient is a 35 year old female with lumbar and abdominal pain.  Patient pelvis in correct alignment.  Patient reports her pain is 50% better.  Patient left therapy with no pain.  Patient has decreased mobility of lumbar sacral area. Patient will benefit from skilled  therapy  to increase function and pain.    Pt will benefit from skilled therapeutic intervention in order to improve on the following deficits Pain;Impaired flexibility;Decreased activity tolerance;Impaired sensation;Decreased strength;Other (comment)   Rehab Potential Good   Clinical Impairments Affecting Rehab Potential Hx of laparoscopy surgery in 2014 and 2016.   PT Frequency 2x / week   PT Duration 8 weeks   PT Treatment/Interventions ADLs/Self Care Home  Management;Biofeedback;Cryotherapy;Electrical Stimulation;Iontophoresis 4mg /ml Dexamethasone;Balance training;Therapeutic exercise;Manual techniques;Therapeutic activities;Stair training;Gait training;Neuromuscular re-education;Patient/family education;Ultrasound;Moist Heat;Traction   PT Next Visit Plan soft tissue work, correct pelvis, stretches; back stabilization exercises   PT Home Exercise Plan progress as needed   Consulted and Agree with Plan of Care Patient        Problem List Patient Active Problem List   Diagnosis Date Noted  . Low back pain radiating to  left leg 10/04/2015  . Leiomyoma 02/09/2015  . Intractable migraine without aura 06/19/2013  . Vertigo 06/13/2013  . Headache 06/13/2013  . Syncope 06/13/2013  . History of endometriosis 06/13/2013  . Menorrhagia 06/12/2013  . Hypokalemia 05/16/2011  . Notus INTESTINE 07/27/2009  . DIARRHEA 07/27/2009  . ABDOMINAL PAIN RIGHT LOWER QUADRANT 07/27/2009  . Other constipation 08/09/2008  . HEMORRHAGE OF RECTUM AND ANUS 08/09/2008  . GASTROESOPHAGEAL REFLUX DISEASE, MILD 07/12/2008  . CROHN'S DISEASE 07/12/2008    Earlie Counts, PT 12/01/2015 9:25 AM    [facility] [address], [place], [place], [address] Phone: [phone]   Fax:  [phone]  Name: Diane [name] MRN: [id number] Date of Birth: 05/22/1981

## 2015-12-01 NOTE — Patient Instructions (Signed)
Bridge    Lie back, legs bent. Inhale, pressing hips up. Keeping ribs in, lengthen lower back. Exhale, rolling down along spine from top. Go from vertebrae to vertebrae.  Repeat _10___ times. Do ___1_ sessions per day.  http://pm.exer.us/55     Lying supine with the hips and knees flexed, feet flat on teh floor, with the knees and feet positioned hip width apart.  An imaginary clock is visualized and the pelvis is rolled up towards 12 oclock (posterior tilt) then towards 6 oclock (anterior tilt).  For examination the thumbs are place either on the superior of preferably inferior slope of the ASISs to note their symmetry or asymmetry in the transverse plane.  As the subject rolls the pelvis towards 12:00 the thumbs follow the ASISs, noting any asymmetry in movement, or if asymmetry is already present to begin with, does the asymmetry change towards 12:00, becoming either more or less symmetrical.  The subject then rolls the pelvis towards 6:00 with the thumbs following the ASISs noting any asymmetry of movement and also noting whether the asymmetry changes as the pelvis moves toward 6:00. The most common pattern seen is that R ASIS is usually inferior or the left ASIS is superior either at the start of the movement or during the movement towards 12:00 and 6:00.   5 times each. 1 time per day. Copyright  VHI. All rights reserved.    Bracing With Arms / Legs (Hook-Lying)    With neutral spine, tighten pelvic floor and abdominals and hold. Raise arm and opposite leg, then return. Repeat wtih other limbs. Repeat _10__ times. Do _1__ times a day.   Copyright  VHI. All rights reserved.  Bracing With Arm / Leg Lift (Prone)    With pillow support, lie on abdomen. Find neutral spine. Tighten pelvic floor and abdominals and hold. Alternately raise one opposite leg. Repeat _10__ times. Do __1_ times a day. Progression: lift one arm and opposite leg.( right arm and left leg)  Copyright  VHI. All  rights reserved.  [facility] [address] [place], [place] [address] Phone # [phone] Fax [phone]

## 2015-12-08 ENCOUNTER — Encounter: Payer: 59 | Admitting: Physical Therapy

## 2015-12-13 ENCOUNTER — Encounter: Payer: Self-pay | Admitting: Physical Therapy

## 2015-12-13 ENCOUNTER — Ambulatory Visit: Payer: 59 | Admitting: Physical Therapy

## 2015-12-13 DIAGNOSIS — M545 Low back pain, unspecified: Secondary | ICD-10-CM

## 2015-12-13 DIAGNOSIS — N949 Unspecified condition associated with female genital organs and menstrual cycle: Secondary | ICD-10-CM

## 2015-12-13 DIAGNOSIS — R102 Pelvic and perineal pain: Secondary | ICD-10-CM

## 2015-12-13 DIAGNOSIS — M2569 Stiffness of other specified joint, not elsewhere classified: Secondary | ICD-10-CM

## 2015-12-13 DIAGNOSIS — M79605 Pain in left leg: Secondary | ICD-10-CM

## 2015-12-13 DIAGNOSIS — R29898 Other symptoms and signs involving the musculoskeletal system: Secondary | ICD-10-CM

## 2015-12-13 DIAGNOSIS — M2560 Stiffness of unspecified joint, not elsewhere classified: Secondary | ICD-10-CM

## 2015-12-13 NOTE — Patient Instructions (Signed)
Arm / Leg Extension: Alternate (All-Fours)    Raise right arm and opposite leg. Do not arch neck. Repeat _10___ times per set. Do __1__ sets per session. Do __1__ sessions per day. Keep abdominals contracted.  Do not let the pelvis hike up.  http://orth.exer.us/110   Copyright  VHI. All rights reserved.  [facility] [address], [place], [place] [address] Phone # [phone] Fax [phone]

## 2015-12-13 NOTE — Therapy (Signed)
[facility] [address] [place], [place], [address] Phone: [phone]   Fax:  [phone]  Physical Therapy Treatment  Patient Details  Name: Diane [name] MRN: [id number] Date of Birth: 27-Aug-1981 Referring Provider: Dr. Margette Fast  Encounter Date: 12/13/2015      PT End of Session - 12/13/15 0905    Visit Number 7   Date for PT Re-Evaluation 01/19/16   Authorization Type UHC   PT Start Time 0905  17 min late   PT Stop Time 0928   PT Time Calculation (min) 23 min   Activity Tolerance Patient tolerated treatment well   Behavior During Therapy [facility] for tasks assessed/performed      Past Medical History  Diagnosis Date  . Crohn's disease ([place])   . PONV (postoperative nausea and vomiting)   . Pneumonia     history back in 2006  . Heart murmur     as child, never had any problems  . Anxiety   . Seasonal allergies   . Headache(784.0)     otc meds - last migraine 2009  . Low blood potassium     history  . Crohn's disease ([place])     history  . Anemia   . History of endometriosis   . Migraine without aura, with intractable migraine, so stated, without mention of status migrainosus 06/19/2013  . Vasovagal syncope   . Fibroid   . Endometriosis   . Vaginal Pap smear, abnormal   . H/O blood clots     tested positive but not located on doppler     Past Surgical History  Procedure Laterality Date  . Tonsillectomy    . Adenoidectomy    . Dilatation & currettage/hysteroscopy with resectocope N/A 06/09/2013    Procedure: DILATATION & CURETTAGE/HYSTEROSCOPY WITH HYSTEROSCOPIC RESECTION OF SUBMUCOSAL FIBROID AND ENDOMETRIAL POLYP;  Surgeon: Marvene Staff, MD;  Location: [facility];  Service: Gynecology;  Laterality: N/A;  . Laparoscopy N/A 06/09/2013    Procedure: LAPAROSCOPY DIAGNOSTIC;  Surgeon: Marvene Staff, MD;  Location: [facility];  Service: Gynecology;  Laterality: N/A;  . Robotic assisted  laparoscopic lysis of adhesion N/A 06/09/2013    Procedure: ROBOTIC ASSISTED LAPAROSCOPIC LYSIS OF ADHESION; Resection of Endometriosis and excision of fibroid;  Surgeon: Marvene Staff, MD;  Location: [facility];  Service: Gynecology;  Laterality: N/A;  . Robot assisted myomectomy N/A 02/09/2015    Procedure: MYOMECTOMY,EXCISION OF ENDOMETRIOSIS,PRE-SACRAL NEURECTOMY;  Surgeon: Governor Specking, MD;  Location: [facility];  Service: Gynecology;  Laterality: N/A;    There were no vitals filed for this visit.  Visit Diagnosis:  Low back pain radiating to left leg  Weakness of left hip  Decreased ROM of trunk and back  Perineal pain in female      Subjective Assessment - 12/13/15 0905    Subjective I think I slept wron and and increased pain in back and cervical. I have been feeling good prior to that.    Patient Stated Goals Find ways to reduce pain, or completely get rid of pain.   Currently in Pain? Yes   Pain Score 4    Pain Location Back   Pain Orientation Mid   Pain Descriptors / Indicators Dull;Aching   Pain Type Chronic pain   Pain Radiating Towards shooting into left leg, throbbing   Pain Onset More than a month ago   Pain Frequency Intermittent   Aggravating Factors  sleeping wrong, movement   Pain Relieving Factors  heating pad   Multiple Pain Sites No            [facility] PT Assessment - 12/13/15 0001    Palpation   SI assessment  left pelvis is posteriorly rotated. sacrum is rotated left                     [facility] Adult PT Treatment/Exercise - 12/13/15 0001    Manual Therapy   Manual Therapy Joint mobilization;Muscle Energy Technique   Joint Mobilization p_ A mobilization and rottaional mobilization to T11-S1 grad 3; sacral mobs to correct left rotation;    Muscle Energy Technique correct right ilium                PT Education - 12/13/15 0928    Education provided Yes   Education Details quadruped lift alternate extremitrimy   Person(s)  Educated Patient   Methods Explanation;Demonstration;Verbal cues;Handout   Comprehension Returned demonstration;Verbalized understanding          PT Short Term Goals - 10/25/15 2057    PT SHORT TERM GOAL #1   Title same as LTGs           PT Long Term Goals - 11/28/15 [id number]    PT LONG TERM GOAL #1   Title Pt will be IND in HEP to reduce pain and improve strength.    Time 6   Period Weeks   Status On-going   PT LONG TERM GOAL #2   Title Perform pelvic floor exam and write appropriate goals as indicated. Target date: 11/22/15   Time 4   Period Weeks   Status Achieved   PT LONG TERM GOAL #3   Title Pt will report she is able to sit and work for >/=2 hours with pain </=4/10 in order to perform work duties.    Time 6   Period Weeks   Status On-going   PT LONG TERM GOAL #4   Title Pt will report she is able to stand and perform household duties with pain </=4/10, in order to care for children.   Time 6   Period Weeks   Status On-going   PT LONG TERM GOAL #5   Title Pt will verbalize strategies to reduce pain while at work and home, in order to perform job tasks and ADLs.    Time 6   Period Weeks   Status On-going   PT LONG TERM GOAL #6   Title pain with walking decreased >/= 75%    Time 6   Period Weeks   Status On-going               Plan - 12/13/15 0928    Clinical Impression Statement Patient is a 35 year old female with lumbar and abdominal pain.  Patient pelvis was in correct alignment after therapy and pain level is 0/10.  Patient was 17 min. late.  Patient  slept wrong and had a flare-up. Patient goals will be assesed next visit due to being late.    Pt will benefit from skilled therapeutic intervention in order to improve on the following deficits Pain;Impaired flexibility;Decreased activity tolerance;Impaired sensation;Decreased strength;Other (comment)   Rehab Potential Good   Clinical Impairments Affecting Rehab Potential Hx of laparoscopy surgery in  2014 and 2016.   PT Frequency 2x / week   PT Duration 8 weeks   PT Treatment/Interventions ADLs/Self Care Home Management;Biofeedback;Cryotherapy;Electrical Stimulation;Iontophoresis 4mg /ml Dexamethasone;Balance training;Therapeutic exercise;Manual techniques;Therapeutic activities;Stair training;Gait training;Neuromuscular re-education;Patient/family education;Ultrasound;Moist Heat;Traction   PT Next  Visit Plan soft tissue work, review HEP   PT Home Exercise Plan progress as needed   Consulted and Agree with Plan of Care Patient        Problem List Patient Active Problem List   Diagnosis Date Noted  . Low back pain radiating to left leg 10/04/2015  . Leiomyoma 02/09/2015  . Intractable migraine without aura 06/19/2013  . Vertigo 06/13/2013  . Headache 06/13/2013  . Syncope 06/13/2013  . History of endometriosis 06/13/2013  . Menorrhagia 06/12/2013  . Hypokalemia 05/16/2011  . Lilly INTESTINE 07/27/2009  . DIARRHEA 07/27/2009  . ABDOMINAL PAIN RIGHT LOWER QUADRANT 07/27/2009  . Other constipation 08/09/2008  . HEMORRHAGE OF RECTUM AND ANUS 08/09/2008  . GASTROESOPHAGEAL REFLUX DISEASE, MILD 07/12/2008  . CROHN'S DISEASE 07/12/2008    Earlie Counts, PT 12/13/2015 9:33 AM   [facility] [address] [place], [place], [address] Phone: [phone]   Fax:  [phone]  Name: Diane [name] MRN: [id number] Date of Birth: 08/13/1981

## 2015-12-15 ENCOUNTER — Ambulatory Visit: Payer: 59 | Admitting: Physical Therapy

## 2015-12-15 ENCOUNTER — Encounter: Payer: Self-pay | Admitting: Physical Therapy

## 2015-12-15 DIAGNOSIS — M79605 Pain in left leg: Secondary | ICD-10-CM

## 2015-12-15 DIAGNOSIS — M545 Low back pain, unspecified: Secondary | ICD-10-CM

## 2015-12-15 DIAGNOSIS — M2569 Stiffness of other specified joint, not elsewhere classified: Secondary | ICD-10-CM

## 2015-12-15 DIAGNOSIS — R102 Pelvic and perineal pain unspecified side: Secondary | ICD-10-CM

## 2015-12-15 DIAGNOSIS — N949 Unspecified condition associated with female genital organs and menstrual cycle: Secondary | ICD-10-CM

## 2015-12-15 DIAGNOSIS — M2560 Stiffness of unspecified joint, not elsewhere classified: Secondary | ICD-10-CM

## 2015-12-15 DIAGNOSIS — R29898 Other symptoms and signs involving the musculoskeletal system: Secondary | ICD-10-CM

## 2015-12-15 NOTE — Therapy (Addendum)
[facility] [address] [place], [place], [address] Phone: [phone]   Fax:  [phone]  Physical Therapy Treatment  Patient Details  Name: Diane [name] MRN: [id number] Date of Birth: [birth year] Referring Provider: Dr. Margette Fast  Encounter Date: 12/15/2015      PT End of Session - 12/15/15 1535    Visit Number 8   Date for PT Re-Evaluation 01/19/16   Authorization Type [employer]   PT Start Time 4496   PT Stop Time 1620   PT Time Calculation (min) 46 min   Activity Tolerance Patient tolerated treatment well   Behavior During Therapy [facility] for tasks assessed/performed      Past Medical History  Diagnosis Date  . Crohn's disease ([place])   . PONV (postoperative nausea and vomiting)   . Pneumonia     history back in 2006  . Heart murmur     as child, never had any problems  . Anxiety   . Seasonal allergies   . Headache(784.0)     otc meds - last migraine 2009  . Low blood potassium     history  . Crohn's disease ([place])     history  . Anemia   . History of endometriosis   . Migraine without aura, with intractable migraine, so stated, without mention of status migrainosus 06/19/2013  . Vasovagal syncope   . Fibroid   . Endometriosis   . Vaginal Pap smear, abnormal   . H/O blood clots     tested positive but not located on doppler     Past Surgical History  Procedure Laterality Date  . Tonsillectomy    . Adenoidectomy    . Dilatation & currettage/hysteroscopy with resectocope N/A 06/09/2013    Procedure: DILATATION & CURETTAGE/HYSTEROSCOPY WITH HYSTEROSCOPIC RESECTION OF SUBMUCOSAL FIBROID AND ENDOMETRIAL POLYP;  Surgeon: Marvene Staff, MD;  Location: [facility];  Service: Gynecology;  Laterality: N/A;  . Laparoscopy N/A 06/09/2013    Procedure: LAPAROSCOPY DIAGNOSTIC;  Surgeon: Marvene Staff, MD;  Location: [facility];  Service: Gynecology;  Laterality: N/A;  . Robotic assisted laparoscopic lysis  of adhesion N/A 06/09/2013    Procedure: ROBOTIC ASSISTED LAPAROSCOPIC LYSIS OF ADHESION; Resection of Endometriosis and excision of fibroid;  Surgeon: Marvene Staff, MD;  Location: [facility];  Service: Gynecology;  Laterality: N/A;  . Robot assisted myomectomy N/A 02/09/2015    Procedure: MYOMECTOMY,EXCISION OF ENDOMETRIOSIS,PRE-SACRAL NEURECTOMY;  Surgeon: Governor Specking, MD;  Location: [facility];  Service: Gynecology;  Laterality: N/A;    There were no vitals filed for this visit.  Visit Diagnosis:  Low back pain radiating to left leg  Weakness of left hip  Decreased ROM of trunk and back  Perineal pain in female      Subjective Assessment - 12/15/15 1536    Subjective I have a little pain in left hip.     Pertinent History Crohn's disease, anxiety, endometriosis, uterine fibroids, pt has given birth to 2 children (8 and 15y/o)   Patient Stated Goals Find ways to reduce pain, or completely get rid of pain.   Currently in Pain? Yes   Pain Score 6    Pain Location Hip   Pain Orientation Right   Pain Descriptors / Indicators Sharp;Aching   Pain Type Chronic pain   Pain Onset More than a month ago   Pain Frequency Intermittent   Aggravating Factors  Not sure   Pain Relieving Factors Not sure  [facility] PT Assessment - 12/15/15 0001    Palpation   SI assessment  pelvis in correct alignment                     [facility] Adult PT Treatment/Exercise - 12/15/15 0001    Modalities   Modalities Electrical Stimulation;Moist Heat   Moist Heat Therapy   Number Minutes Moist Heat 20 Minutes   Moist Heat Location Lumbar Spine   Electrical Stimulation   Electrical Stimulation Location left lumbar   Electrical Stimulation Action IFC   Electrical Stimulation Parameters to patient tolerance, 20 min   Electrical Stimulation Goals Pain   Manual Therapy   Manual Therapy Soft tissue mobilization   Manual therapy comments manually stretched left hip adductors,  rotators, left hamstring, left posterior hip capsule   Soft tissue mobilization left gluteal, left tensor fascia latta, left piriformis, left quadratus                PT Education - 12/15/15 1603    Education provided No          PT Short Term Goals - 10/25/15 2057    PT SHORT TERM GOAL #1   Title same as LTGs           PT Long Term Goals - 12/15/15 1605    PT LONG TERM GOAL #1   Title Pt will be IND in HEP to reduce pain and improve strength.    Time 6   Period Weeks   Status On-going   PT LONG TERM GOAL #2   Title Perform pelvic floor exam and write appropriate goals as indicated. Target date: 11/22/15   Time 4   Period Weeks   Status Achieved   PT LONG TERM GOAL #3   Title Pt will report she is able to sit and work for >/=2 hours with pain </=4/10 in order to perform work duties.    Time 6   Period Weeks   Status On-going  increased pai today'   PT LONG TERM GOAL #4   Title Pt will report she is able to stand and perform household duties with pain </=4/10, in order to care for children.   Time 6   Period Weeks   Status On-going  6/10 pain   PT LONG TERM GOAL #5   Title Pt will verbalize strategies to reduce pain while at work and home, in order to perform job tasks and ADLs.    Period Weeks   Status On-going   PT LONG TERM GOAL #6   Title pain with walking decreased >/= 75%    Time 6   Period Weeks   Status On-going  6/10 pain               Plan - 12/15/15 1604    Pt will benefit from skilled therapeutic intervention in order to improve on the following deficits Pain;Impaired flexibility;Decreased activity tolerance;Impaired sensation;Decreased strength;Other (comment)   Rehab Potential Good   Clinical Impairments Affecting Rehab Potential Hx of laparoscopy surgery in 2014 and 2016.   PT Frequency 2x / week   PT Duration 8 weeks   PT Treatment/Interventions ADLs/Self Care Home Management;Biofeedback;Cryotherapy;Electrical  Stimulation;Iontophoresis '4mg'$ /ml Dexamethasone;Balance training;Therapeutic exercise;Manual techniques;Therapeutic activities;Stair training;Gait training;Neuromuscular re-education;Patient/family education;Ultrasound;Moist Heat;Traction   PT Next Visit Plan soft tissue work, check hip strength, elliptical, ultrsound to left hip   PT Home Exercise Plan progress as needed   Consulted and Agree with Plan of Care Patient  Problem List Patient Active Problem List   Diagnosis Date Noted  . Low back pain radiating to left leg 10/04/2015  . Leiomyoma 02/09/2015  . Intractable migraine without aura 06/19/2013  . Vertigo 06/13/2013  . Headache 06/13/2013  . Syncope 06/13/2013  . History of endometriosis 06/13/2013  . Menorrhagia 06/12/2013  . Hypokalemia 05/16/2011  . [place] INTESTINE 07/27/2009  . DIARRHEA 07/27/2009  . ABDOMINAL PAIN RIGHT LOWER QUADRANT 07/27/2009  . Other constipation 08/09/2008  . HEMORRHAGE OF RECTUM AND ANUS 08/09/2008  . GASTROESOPHAGEAL REFLUX DISEASE, MILD 07/12/2008  . CROHN'S DISEASE 07/12/2008    Earlie Counts, PT 12/15/2015 4:08 PM   [facility] [address] [place], [place], [address] Phone: [phone]   Fax:  [phone]  Name: Diane [name] MRN: [id number] Date of Birth: December 19, 1980    PHYSICAL THERAPY DISCHARGE SUMMARY  Visits from Start of Care: 8  Current functional level related to goals / functional outcomes: See above. Patient changed jobs and insurance making it difficult for her to get to therapy.  Patient has not called to make any further appointments.  Remaining deficits: See above.    Education / Equipment: HEP  Plan: Patient agrees to discharge.  Patient goals were not met. Patient is being discharged due to meeting the stated rehab goals. Thank you for the referral. Earlie Counts, PT 02/14/2016 3:15 PM   ?????

## 2015-12-20 ENCOUNTER — Ambulatory Visit: Payer: 59 | Admitting: Physical Therapy

## 2015-12-22 ENCOUNTER — Encounter: Payer: 59 | Admitting: Physical Therapy

## 2015-12-23 ENCOUNTER — Ambulatory Visit: Payer: 59 | Admitting: Physical Therapy

## 2015-12-27 ENCOUNTER — Encounter: Payer: 59 | Admitting: Physical Therapy

## 2015-12-29 ENCOUNTER — Encounter: Payer: 59 | Admitting: Physical Therapy

## 2016-01-03 ENCOUNTER — Ambulatory Visit: Payer: 59 | Attending: Neurology | Admitting: Physical Therapy

## 2016-01-04 ENCOUNTER — Telehealth: Payer: Self-pay | Admitting: Family Medicine

## 2016-01-04 MED ORDER — FLUTICASONE PROPIONATE 50 MCG/ACT NA SUSP: 2.0000 | Freq: Every day | NASAL | Status: DC

## 2016-01-04 NOTE — Telephone Encounter (Signed)
Pt was seen last on 06/20/2015. Okay to send her in a nasal spray?

## 2016-01-04 NOTE — Telephone Encounter (Signed)
Medication sent in for patient. 

## 2016-01-04 NOTE — Telephone Encounter (Signed)
Pt would like to have Qunasal for her allergies.

## 2016-01-04 NOTE — Telephone Encounter (Signed)
Refills OK for one year.

## 2016-01-10 ENCOUNTER — Encounter: Payer: 59 | Admitting: Physical Therapy

## 2016-01-12 ENCOUNTER — Encounter: Payer: 59 | Admitting: Physical Therapy

## 2016-01-17 ENCOUNTER — Encounter: Payer: 59 | Admitting: Physical Therapy

## 2016-01-19 ENCOUNTER — Encounter: Payer: 59 | Admitting: Physical Therapy

## 2016-02-02 ENCOUNTER — Telehealth: Payer: Self-pay | Admitting: Neurology

## 2016-02-02 ENCOUNTER — Ambulatory Visit: Payer: 59 | Admitting: Neurology

## 2016-02-02 NOTE — Telephone Encounter (Signed)
This patient canceled appointment within 24 hours of revisit date.

## 2016-02-03 ENCOUNTER — Encounter: Payer: Self-pay | Admitting: Neurology

## 2016-05-23 ENCOUNTER — Telehealth: Payer: Self-pay | Admitting: Family Medicine

## 2016-05-23 NOTE — Telephone Encounter (Signed)
Pt has a yeast infection and would like diflucan call into [facility] out pt pharm [phone]

## 2016-05-23 NOTE — Telephone Encounter (Signed)
Pt needs a wet prep--please schedule with MD of her choice for that--she may prefer a female.

## 2016-05-24 NOTE — Telephone Encounter (Signed)
Pt will just go to urgent care. Pt works in [employer]

## 2017-06-10 ENCOUNTER — Encounter (HOSPITAL_COMMUNITY): Payer: Self-pay

## 2017-06-10 ENCOUNTER — Emergency Department (HOSPITAL_COMMUNITY)
Admission: EM | Admit: 2017-06-10 | Discharge: 2017-06-10 | Disposition: A | Discharge status: Home / Self Care | Payer: Commercial Managed Care - PPO | Attending: Emergency Medicine | Admitting: Emergency Medicine

## 2017-06-10 ENCOUNTER — Emergency Department (HOSPITAL_COMMUNITY): Payer: Commercial Managed Care - PPO

## 2017-06-10 DIAGNOSIS — R0602 Shortness of breath: Secondary | ICD-10-CM | POA: Diagnosis not present

## 2017-06-10 DIAGNOSIS — R509 Fever, unspecified: Secondary | ICD-10-CM | POA: Diagnosis not present

## 2017-06-10 DIAGNOSIS — E876 Hypokalemia: Secondary | ICD-10-CM | POA: Diagnosis not present

## 2017-06-10 DIAGNOSIS — Z79899 Other long term (current) drug therapy: Secondary | ICD-10-CM | POA: Diagnosis not present

## 2017-06-10 DIAGNOSIS — R109 Unspecified abdominal pain: Secondary | ICD-10-CM | POA: Diagnosis not present

## 2017-06-10 LAB — URINALYSIS, ROUTINE W REFLEX MICROSCOPIC
Bacteria, UA: NONE SEEN
Bilirubin Urine: NEGATIVE
Glucose, UA: NEGATIVE mg/dL
Hgb urine dipstick: NEGATIVE
Ketones, ur: NEGATIVE mg/dL
Leukocytes, UA: NEGATIVE
Nitrite: NEGATIVE
Protein, ur: 30 mg/dL — AB
Specific Gravity, Urine: 1.025 (ref 1.005–1.030)
pH: 5 (ref 5.0–8.0)

## 2017-06-10 LAB — COMPREHENSIVE METABOLIC PANEL
ALT: 32 U/L (ref 14–54)
AST: 41 U/L (ref 15–41)
Albumin: 3.6 g/dL (ref 3.5–5.0)
Alkaline Phosphatase: 70 U/L (ref 38–126)
Anion gap: 9 (ref 5–15)
BUN: 10 mg/dL (ref 6–20)
CO2: 27 mmol/L (ref 22–32)
Calcium: 8.2 mg/dL — ABNORMAL LOW (ref 8.9–10.3)
Chloride: 103 mmol/L (ref 101–111)
Creatinine, Ser: 0.62 mg/dL (ref 0.44–1.00)
GFR calc Af Amer: >60 mL/min (ref >60)
GFR calc non Af Amer: >60 mL/min (ref >60)
Glucose, Bld: 99 mg/dL (ref 65–99)
Potassium: 2.8 mmol/L — ABNORMAL LOW (ref 3.5–5.1)
Sodium: 139 mmol/L (ref 135–145)
Total Bilirubin: 0.5 mg/dL (ref 0.3–1.2)
Total Protein: 6.7 g/dL (ref 6.5–8.1)

## 2017-06-10 LAB — CBC
HCT: 36 % (ref 36.0–46.0)
Hemoglobin: 12.5 g/dL (ref 12.0–15.0)
MCH: 28.5 pg (ref 26.0–34.0)
MCHC: 34.7 g/dL (ref 30.0–36.0)
MCV: 82.2 fL (ref 78.0–100.0)
Platelets: 162 10*3/uL (ref 150–400)
RBC: 4.38 MIL/uL (ref 3.87–5.11)
RDW: 12.7 % (ref 11.5–15.5)
WBC: 5.2 10*3/uL (ref 4.0–10.5)

## 2017-06-10 LAB — I-STAT BETA HCG BLOOD, ED (MC, WL, AP ONLY): I-stat hCG, quantitative: <5 m[IU]/mL (ref <5)

## 2017-06-10 LAB — LIPASE, BLOOD: Lipase: 32 U/L (ref 11–51)

## 2017-06-10 MED ORDER — ONDANSETRON HCL 4 MG/2ML IJ SOLN
4.0000 mg | Freq: Once | INTRAMUSCULAR | Status: AC
  Administered 2017-06-10: 4 mg via INTRAVENOUS
  Filled 2017-06-10: qty 2

## 2017-06-10 MED ORDER — IOPAMIDOL (ISOVUE-300) INJECTION 61%
INTRAVENOUS | Status: AC
  Filled 2017-06-10: qty 100

## 2017-06-10 MED ORDER — POTASSIUM CHLORIDE CRYS ER 20 MEQ PO TBCR: 20.0000 meq | EXTENDED_RELEASE_TABLET | Freq: Two times a day (BID) | ORAL | 0 refills | Status: DC

## 2017-06-10 MED ORDER — SODIUM CHLORIDE 0.9 % IV BOLUS (SEPSIS)
500.0000 mL | Freq: Once | INTRAVENOUS | Status: AC
  Administered 2017-06-10: 500 mL via INTRAVENOUS

## 2017-06-10 MED ORDER — IOPAMIDOL (ISOVUE-300) INJECTION 61%
INTRAVENOUS | Status: AC
  Administered 2017-06-10: 30 mL via ORAL
  Filled 2017-06-10: qty 30

## 2017-06-10 MED ORDER — SODIUM CHLORIDE 0.9 % IV BOLUS (SEPSIS)
1000.0000 mL | Freq: Once | INTRAVENOUS | Status: AC
  Administered 2017-06-10: 1000 mL via INTRAVENOUS

## 2017-06-10 MED ORDER — IOPAMIDOL (ISOVUE-300) INJECTION 61%
100.0000 mL | Freq: Once | INTRAVENOUS | Status: AC | PRN
  Administered 2017-06-10: 100 mL via INTRAVENOUS

## 2017-06-10 MED ORDER — ACETAMINOPHEN 325 MG PO TABS
650.0000 mg | ORAL_TABLET | Freq: Once | ORAL | Status: AC
  Administered 2017-06-10: 650 mg via ORAL
  Filled 2017-06-10: qty 2

## 2017-06-10 MED ORDER — POTASSIUM CHLORIDE 10 MEQ/100ML IV SOLN
10.0000 meq | Freq: Once | INTRAVENOUS | Status: AC
  Administered 2017-06-10: 10 meq via INTRAVENOUS
  Filled 2017-06-10: qty 100

## 2017-06-10 MED ORDER — ONDANSETRON 4 MG PO TBDP: 4.0000 mg | ORAL_TABLET | Freq: Three times a day (TID) | ORAL | 0 refills | Status: DC | PRN

## 2017-06-10 MED ORDER — IOPAMIDOL (ISOVUE-300) INJECTION 61%
30.0000 mL | Freq: Once | INTRAVENOUS | Status: AC | PRN
  Administered 2017-06-10: 30 mL via ORAL

## 2017-06-10 NOTE — ED Provider Notes (Signed)
I assumed care of this patient from Dr. Alvino Chapel at 1700.  Please see their note for further details of Hx, PE.  Briefly patient is a 36 y.o. female who presents with suprapubic abdominal pain. History of Crohn's disease. Labs grossly reassuring but did note mild hypokalemia. CT scan negative. Noted to be febrile. Endorsing some chest congestion. Currently awaiting chest x-ray. Current plan is to follow-up chest x-ray and reevaluate.  Chest x-ray negative.  Reexamination patient had right upper quadrant tenderness. Bedside ultrasound without evidence of acute cholecystitis.  Able to tolerate by mouth intake. The patient is safe for discharge with strict return precautions.   Disposition: Discharge  Condition: Good  I have discussed the results, Dx and Tx plan with the patient who expressed understanding and agree(s) with the plan. Discharge instructions discussed at great length. The patient was given strict return precautions who verbalized understanding of the instructions. No further questions at time of discharge.    New Prescriptions   ONDANSETRON (ZOFRAN-ODT) 4 MG DISINTEGRATING TABLET    Take 1 tablet (4 mg total) by mouth every 8 (eight) hours as needed for nausea or vomiting.   POTASSIUM CHLORIDE SA (K-DUR,KLOR-CON) 20 MEQ TABLET    Take 1 tablet (20 mEq total) by mouth 2 (two) times daily.    Follow Up: Bartholome Bill, MD [address] [place] [address] [phone]   As needed   EMERGENCY DEPARTMENT BILIARY ULTRASOUND INTERPRETATION "Study: Limited Abdominal Ultrasound of the Gallbladder and Common Bile Duct."  INDICATIONS: Abdominal pain Indication: Multiple views of the gallbladder and common bile duct were obtained in real-time with a Multi-frequency probe."  PERFORMED BY:  Myself IMAGES ARCHIVED?: Yes LIMITATIONS: None INTERPRETATION: Normal, Gallbladder wall normal in thickness and Common bile duct normal in size      Cardama, Grayce Sessions, MD 06/10/17 1814

## 2017-06-10 NOTE — ED Notes (Signed)
Pt placed on cardiac monitor. RN informed that pt is requesting something for nausea

## 2017-06-10 NOTE — ED Triage Notes (Signed)
Patient states a fever x 3 days. Patient has been alternating Ibuprofen and Tylenol. Patient states she has been having slight SOB and tenderness of the left lower abdomen. Patient also c/o tenderness of the left axilla that started today. Patient reports a history of Crohn's and pneumonia.

## 2017-06-10 NOTE — ED Provider Notes (Signed)
[facility] DEPT Provider Note   CSN: [id number] Arrival date & time: 06/10/17  0910     History   Chief Complaint Chief Complaint  Patient presents with  . Fever  . Abdominal Pain    HPI Diane [name] is a 36 y.o. female.  HPI Patient with 3 days of fever. Reportedly up to 103 at home. Has some mild left lower quadrant abdominal pain. No nausea vomiting diarrhea. Slight shortness of breath. No cough. Has a history of Crohn's or previous pneumonia. States she's been in remission for Crohn's for the last 8 years. She is not currently on treatment for it. No dysuria. No diarrhea. No headache. No neck pain. No vaginal bleeding or discharge. Denies possibility of pregnancy. States she is due to start her menses is having some left breast pain which is not unusual for her. Past Medical History:  Diagnosis Date  . Anemia   . Anxiety   . Crohn's disease ([place])   . Crohn's disease ([place])    history  . Endometriosis   . Fibroid   . H/O blood clots    tested positive but not located on doppler   . Headache(784.0)    otc meds - last migraine 2009  . Heart murmur    as child, never had any problems  . History of endometriosis   . Low blood potassium    history  . Migraine without aura, with intractable migraine, so stated, without mention of status migrainosus 06/19/2013  . Pneumonia    history back in 2006  . PONV (postoperative nausea and vomiting)   . Seasonal allergies   . Vaginal Pap smear, abnormal   . Vasovagal syncope     Patient Active Problem List   Diagnosis Date Noted  . Low back pain radiating to left leg 10/04/2015  . Leiomyoma 02/09/2015  . Intractable migraine without aura 06/19/2013  . Vertigo 06/13/2013  . Headache 06/13/2013  . Syncope 06/13/2013  . History of endometriosis 06/13/2013  . Menorrhagia 06/12/2013  . Hypokalemia 05/16/2011  . [place] INTESTINE 07/27/2009  . DIARRHEA 07/27/2009  . ABDOMINAL PAIN RIGHT LOWER  QUADRANT 07/27/2009  . Other constipation 08/09/2008  . HEMORRHAGE OF RECTUM AND ANUS 08/09/2008  . GASTROESOPHAGEAL REFLUX DISEASE, MILD 07/12/2008  . CROHN'S DISEASE 07/12/2008    Past Surgical History:  Procedure Laterality Date  . ADENOIDECTOMY    . DILATATION & CURRETTAGE/HYSTEROSCOPY WITH RESECTOCOPE N/A 06/09/2013   Procedure: DILATATION & CURETTAGE/HYSTEROSCOPY WITH HYSTEROSCOPIC RESECTION OF SUBMUCOSAL FIBROID AND ENDOMETRIAL POLYP;  Surgeon: Marvene Staff, MD;  Location: [facility];  Service: Gynecology;  Laterality: N/A;  . LAPAROSCOPY N/A 06/09/2013   Procedure: LAPAROSCOPY DIAGNOSTIC;  Surgeon: Marvene Staff, MD;  Location: [facility];  Service: Gynecology;  Laterality: N/A;  . ROBOT ASSISTED MYOMECTOMY N/A 02/09/2015   Procedure: MYOMECTOMY,EXCISION OF ENDOMETRIOSIS,PRE-SACRAL NEURECTOMY;  Surgeon: Governor Specking, MD;  Location: [facility];  Service: Gynecology;  Laterality: N/A;  . ROBOTIC ASSISTED LAPAROSCOPIC LYSIS OF ADHESION N/A 06/09/2013   Procedure: ROBOTIC ASSISTED LAPAROSCOPIC LYSIS OF ADHESION; Resection of Endometriosis and excision of fibroid;  Surgeon: Marvene Staff, MD;  Location: [facility];  Service: Gynecology;  Laterality: N/A;  . TONSILLECTOMY      OB History    Gravida Para Term Preterm AB Living   2 2 2     2    SAB TAB Ectopic Multiple Live Births  Home Medications    Prior to Admission medications   Medication Sig Start Date End Date Taking? Authorizing Provider  amphetamine-dextroamphetamine (ADDERALL) 30 MG tablet Take 30 mg by mouth 2 (two) times daily.   Yes [provider]  cetirizine (ZYRTEC) 10 MG tablet Take 10 mg by mouth daily as needed for allergies.   Yes [provider]  cloNIDine (CATAPRES) 0.2 MG tablet Take 0.3 mg by mouth at bedtime.   Yes [provider]  FLUoxetine (PROZAC) 20 MG capsule Take 20 mg by mouth at bedtime.    Yes [provider]  LORazepam (ATIVAN) 0.5 MG  tablet Take 0.5 mg by mouth at bedtime.   Yes [provider]  fexofenadine (ALLEGRA) 180 MG tablet Take 180-360 mg by mouth daily as needed for allergies.     [provider]  fluticasone (FLONASE) 50 MCG/ACT nasal spray Place 2 sprays into both nostrils daily. Patient not taking: Reported on 06/10/2017 01/04/16   Eulas Post, MD  gabapentin (NEURONTIN) 300 MG capsule Take 1 capsule (300 mg total) by mouth 2 (two) times daily. Patient not taking: Reported on 06/10/2017 10/04/15   Kathrynn Ducking, MD  ibuprofen (ADVIL,MOTRIN) 800 MG tablet Take 1 tablet (800 mg total) by mouth 3 (three) times daily. Patient not taking: Reported on 06/10/2017 07/24/15   Joy, Helane Gunther, PA-C  methocarbamol (ROBAXIN) 500 MG tablet Take 1 tablet (500 mg total) by mouth 2 (two) times daily. Patient not taking: Reported on 10/25/2015 07/24/15   Joy, Shawn C, PA-C  ondansetron (ZOFRAN-ODT) 4 MG disintegrating tablet Take 1 tablet (4 mg total) by mouth every 8 (eight) hours as needed for nausea or vomiting. 06/10/17   Davonna Belling, MD  potassium chloride SA (K-DUR,KLOR-CON) 20 MEQ tablet Take 1 tablet (20 mEq total) by mouth 2 (two) times daily. 06/10/17   Davonna Belling, MD    Family History Family History  Problem Relation Age of Onset  . Heart disease Mother 65       CHF  . Crohn's disease Mother   . Asthma Mother   . Asthma Brother   . Heart disease Maternal Aunt   . Diabetes Maternal Aunt   . Stroke Maternal Aunt   . Heart disease Maternal Grandfather   . Stroke Maternal Grandfather     Social History Social History  Substance Use Topics  . Smoking status: Never Smoker  . Smokeless tobacco: Never Used  . Alcohol use No     Allergies   Patient has no known allergies.   Review of Systems Review of Systems  Constitutional: Positive for appetite change and fever.  HENT: Negative for drooling.   Gastrointestinal: Positive for abdominal pain. Negative for vomiting.    Endocrine: Negative for polyphagia and polyuria.  Genitourinary: Negative for flank pain.  Musculoskeletal: Negative for back pain.  Skin: Negative for rash.  Neurological: Negative for seizures.  Psychiatric/Behavioral: Negative for confusion.     Physical Exam Updated Vital Signs BP (!) 157/99 (BP Location: Right Arm)   Pulse (!) 113   Temp (!) 101.4 F (38.6 C) (Oral)   Resp 16   Ht 5' 6.75" (1.695 m)   Wt 70.3 kg (155 lb)   LMP 05/15/2017   SpO2 100%   BMI 24.46 kg/m   Physical Exam  Constitutional: She appears well-developed.  HENT:  Head: Atraumatic.  Eyes: EOM are normal.  Neck: Neck supple.  Cardiovascular: Normal rate.   Pulmonary/Chest: Effort normal and breath sounds normal.  She exhibits no tenderness.  Abdominal: There is tenderness.  Left lower and right upper quadrant tenderness without rebound or guarding.  Musculoskeletal: She exhibits no edema or tenderness.  Neurological: She is alert.  Skin: Skin is warm. Capillary refill takes less than 2 seconds.     ED Treatments / Results  Labs (all labs ordered are listed, but only abnormal results are displayed) Labs Reviewed  COMPREHENSIVE METABOLIC PANEL - Abnormal; Notable for the following:       Result Value   Potassium 2.8 (*)    Calcium 8.2 (*)    All other components within normal limits  URINALYSIS, ROUTINE W REFLEX MICROSCOPIC - Abnormal; Notable for the following:    Protein, ur 30 (*)    Squamous Epithelial / LPF 0-5 (*)    All other components within normal limits  LIPASE, BLOOD  CBC  I-STAT BETA HCG BLOOD, ED ([facility], [facility], [facility] ONLY)    EKG  EKG Interpretation None       Radiology Ct Abdomen Pelvis W Contrast  Result Date: 06/10/2017 CLINICAL DATA:  Fever and left lower quadrant abdominal pain. History of Crohn's disease. EXAM: CT ABDOMEN AND PELVIS WITH CONTRAST TECHNIQUE: Multidetector CT imaging of the abdomen and pelvis was performed using the standard protocol following bolus  administration of intravenous contrast. CONTRAST:  185m ISOVUE-300 IOPAMIDOL (ISOVUE-300) INJECTION 61% COMPARISON:  CT abdomen and pelvis dated July 21, 2014. FINDINGS: Lower chest: No acute abnormality. Hepatobiliary: No focal liver abnormality is seen. No gallstones, gallbladder wall thickening, or biliary dilatation. Pancreas: Unremarkable. No pancreatic ductal dilatation or surrounding inflammatory changes. Spleen: Normal in size without focal abnormality. Adrenals/Urinary Tract: The adrenal glands are unremarkable. 10 mm simple cyst in the right kidney. Additional bilateral subcentimeter low-density lesions within both kidneys are too small to characterize. No renal calculi or hydronephrosis. The bladder wall is mildly thickened, possibly related to decompression. Stomach/Bowel: Stomach is within normal limits. Appendix appears normal. No evidence of bowel wall thickening, distention, or inflammatory changes. Vascular/Lymphatic: No significant vascular findings are present. No enlarged abdominal or pelvic lymph nodes. Reproductive: Fibroid uterus. Bilateral adnexa are unremarkable. Right corpus luteum. Other: Small amount of free fluid in the pelvis, likely physiologic. No free air. Small fat containing supraumbilical hernia. Musculoskeletal: No acute or significant osseous findings. IMPRESSION: 1. No acute intra-abdominal process. 2. Fibroid uterus. Small amount of free fluid in the pelvis, likely physiologic. Electronically Signed   By: WTitus DubinM.D.   On: 06/10/2017 15:46    Procedures Procedures (including critical care time)  Medications Ordered in ED Medications  iopamidol (ISOVUE-300) 61 % injection (not administered)  potassium chloride 10 mEq in 100 mL IVPB (10 mEq Intravenous New Bag/Given 06/10/17 1627)  sodium chloride 0.9 % bolus 1,000 mL (1,000 mLs Intravenous New Bag/Given 06/10/17 1627)  acetaminophen (TYLENOL) tablet 650 mg (not administered)  sodium chloride 0.9 % bolus  500 mL (0 mLs Intravenous Stopped 06/10/17 1557)  iopamidol (ISOVUE-300) 61 % injection 30 mL (30 mLs Oral Contrast Given 06/10/17 1258)  iopamidol (ISOVUE-300) 61 % injection 100 mL (100 mLs Intravenous Contrast Given 06/10/17 1515)  ondansetron (ZOFRAN) injection 4 mg (4 mg Intravenous Given 06/10/17 1627)     Initial Impression / Assessment and Plan / ED Course  I have reviewed the triage vital signs and the nursing notes.  Pertinent labs & imaging results that were available during my care of the patient were reviewed by me and considered in my medical decision making (see chart for  details).   patient with fever. Some slight shortness of breath and does have some abdominal tenderness. History of Crohn's, however CT scan is reassuring. Chest x-ray pending due to slight shortness of breath. Urine reassuring. Labs overall reassuring but does have hypokalemia. Will supplement. Patient's overall well-appearing and likely will be able to discharge home. Will need to follow with PCP. Care turned over to Dr. Dallas Breeding.  Final Clinical Impressions(s) / ED Diagnoses   Final diagnoses:  Fever, unspecified fever cause  Abdominal pain, unspecified abdominal location  Hypokalemia    New Prescriptions New Prescriptions   ONDANSETRON (ZOFRAN-ODT) 4 MG DISINTEGRATING TABLET    Take 1 tablet (4 mg total) by mouth every 8 (eight) hours as needed for nausea or vomiting.   POTASSIUM CHLORIDE SA (K-DUR,KLOR-CON) 20 MEQ TABLET    Take 1 tablet (20 mEq total) by mouth 2 (two) times daily.     Davonna Belling, MD 06/10/17 [phone]

## 2017-09-30 LAB — OB RESULTS CONSOLE VARICELLA ZOSTER ANTIBODY, IGG: Varicella: IMMUNE

## 2017-09-30 LAB — OB RESULTS CONSOLE HEPATITIS B SURFACE ANTIGEN: Hepatitis B Surface Ag: NEGATIVE

## 2017-09-30 LAB — OB RESULTS CONSOLE RUBELLA ANTIBODY, IGM: Rubella: IMMUNE

## 2017-09-30 LAB — URINE CULTURE
Hemoglobin A1C: 5
Hemoglobin Evaluation: NORMAL

## 2017-09-30 LAB — OB RESULTS CONSOLE HIV ANTIBODY (ROUTINE TESTING): HIV: NONREACTIVE

## 2017-09-30 LAB — OB RESULTS CONSOLE RPR: RPR: NONREACTIVE

## 2017-09-30 LAB — OB RESULTS CONSOLE ABO/RH: RH Type: POSITIVE

## 2017-10-25 ENCOUNTER — Encounter (HOSPITAL_COMMUNITY): Payer: Self-pay | Admitting: *Deleted

## 2017-10-25 ENCOUNTER — Other Ambulatory Visit: Payer: Self-pay

## 2017-10-25 ENCOUNTER — Inpatient Hospital Stay (HOSPITAL_COMMUNITY)
Admission: AD | Admit: 2017-10-25 | Discharge: 2017-10-26 | Disposition: A | Discharge status: Home / Self Care | Payer: Commercial Managed Care - PPO | Source: Ambulatory Visit | Attending: Obstetrics and Gynecology | Admitting: Obstetrics and Gynecology

## 2017-10-25 DIAGNOSIS — O26891 Other specified pregnancy related conditions, first trimester: Secondary | ICD-10-CM | POA: Insufficient documentation

## 2017-10-25 DIAGNOSIS — M79604 Pain in right leg: Secondary | ICD-10-CM

## 2017-10-25 DIAGNOSIS — Z3A11 11 weeks gestation of pregnancy: Secondary | ICD-10-CM | POA: Diagnosis not present

## 2017-10-25 DIAGNOSIS — E876 Hypokalemia: Secondary | ICD-10-CM

## 2017-10-25 DIAGNOSIS — O1201 Gestational edema, first trimester: Secondary | ICD-10-CM | POA: Diagnosis not present

## 2017-10-25 DIAGNOSIS — M7989 Other specified soft tissue disorders: Secondary | ICD-10-CM

## 2017-10-25 LAB — CBC WITH DIFFERENTIAL/PLATELET
Basophils Absolute: 0 10*3/uL (ref 0.0–0.1)
Basophils Relative: 0 %
Eosinophils Absolute: 0.1 10*3/uL (ref 0.0–0.7)
Eosinophils Relative: 2 %
HCT: 29.6 % — ABNORMAL LOW (ref 36.0–46.0)
Hemoglobin: 10.4 g/dL — ABNORMAL LOW (ref 12.0–15.0)
Lymphocytes Relative: 30 %
Lymphs Abs: 1.9 10*3/uL (ref 0.7–4.0)
MCH: 30.4 pg (ref 26.0–34.0)
MCHC: 35.1 g/dL (ref 30.0–36.0)
MCV: 86.5 fL (ref 78.0–100.0)
Monocytes Absolute: 0.2 10*3/uL (ref 0.1–1.0)
Monocytes Relative: 2 %
Neutro Abs: 4.1 10*3/uL (ref 1.7–7.7)
Neutrophils Relative %: 66 %
Platelets: 200 10*3/uL (ref 150–400)
RBC: 3.42 MIL/uL — ABNORMAL LOW (ref 3.87–5.11)
RDW: 14.4 % (ref 11.5–15.5)
WBC: 6.2 10*3/uL (ref 4.0–10.5)

## 2017-10-25 LAB — COMPREHENSIVE METABOLIC PANEL
ALT: 8 U/L — ABNORMAL LOW (ref 14–54)
AST: 14 U/L — ABNORMAL LOW (ref 15–41)
Albumin: 3.2 g/dL — ABNORMAL LOW (ref 3.5–5.0)
Alkaline Phosphatase: 48 U/L (ref 38–126)
Anion gap: 7 (ref 5–15)
BUN: 14 mg/dL (ref 6–20)
CO2: 23 mmol/L (ref 22–32)
Calcium: 8.3 mg/dL — ABNORMAL LOW (ref 8.9–10.3)
Chloride: 105 mmol/L (ref 101–111)
Creatinine, Ser: 0.49 mg/dL (ref 0.44–1.00)
GFR calc Af Amer: >60 mL/min (ref >60)
GFR calc non Af Amer: >60 mL/min (ref >60)
Glucose, Bld: 93 mg/dL (ref 65–99)
Potassium: 3.1 mmol/L — ABNORMAL LOW (ref 3.5–5.1)
Sodium: 135 mmol/L (ref 135–145)
Total Bilirubin: 0.4 mg/dL (ref 0.3–1.2)
Total Protein: 6.1 g/dL — ABNORMAL LOW (ref 6.5–8.1)

## 2017-10-25 MED ORDER — POTASSIUM CHLORIDE CRYS ER 10 MEQ PO TBCR
10.0000 meq | EXTENDED_RELEASE_TABLET | Freq: Once | ORAL | Status: DC
  Filled 2017-10-25: qty 1

## 2017-10-25 MED ORDER — ENOXAPARIN SODIUM 80 MG/0.8ML ~~LOC~~ SOLN
70.0000 mg | Freq: Once | SUBCUTANEOUS | Status: AC
  Administered 2017-10-25: 70 mg via SUBCUTANEOUS
  Filled 2017-10-25: qty 0.8

## 2017-10-25 NOTE — MAU Provider Note (Signed)
History     CSN: [id number]  Arrival date and time: 10/25/17 2054   First Provider Initiated Contact with Patient 10/25/17 2208      Chief Complaint  Patient presents with  . Leg Pain   HPI  [name] is a 37 y.o. G3P2002 at [redacted]w[redacted]d who presents to MAU today with complaint of right knee pain x 2 weeks and right calf swelling x 2 days. She states that she had elevated her leg all day today without relief. She has not taken anything for pain. She denies erythema, but feels it may be discolored. She denies recent injury. She has a mostly sedentary job.   OB History    Gravida Para Term Preterm AB Living   3 2 2     2    SAB TAB Ectopic Multiple Live Births                  Past Medical History:  Diagnosis Date  . Anemia   . Anxiety   . Crohn's disease ([place])   . Crohn's disease ([place])    history  . Endometriosis   . Fibroid   . H/O blood clots    tested positive but not located on doppler   . Headache(784.0)    otc meds - last migraine 2009  . Heart murmur    as child, never had any problems  . History of endometriosis   . Low blood potassium    history  . Migraine without aura, with intractable migraine, so stated, without mention of status migrainosus 06/19/2013  . Pneumonia    history back in 2006  . PONV (postoperative nausea and vomiting)   . Seasonal allergies   . Vaginal Pap smear, abnormal   . Vasovagal syncope     Past Surgical History:  Procedure Laterality Date  . ADENOIDECTOMY    . DILATATION & CURRETTAGE/HYSTEROSCOPY WITH RESECTOCOPE N/A 06/09/2013   Procedure: DILATATION & CURETTAGE/HYSTEROSCOPY WITH HYSTEROSCOPIC RESECTION OF SUBMUCOSAL FIBROID AND ENDOMETRIAL POLYP;  Surgeon: Marvene Staff, MD;  Location: [facility];  Service: Gynecology;  Laterality: N/A;  . LAPAROSCOPY N/A 06/09/2013   Procedure: LAPAROSCOPY DIAGNOSTIC;  Surgeon: Marvene Staff, MD;  Location: [facility];  Service: Gynecology;  Laterality: N/A;  . ROBOT ASSISTED  MYOMECTOMY N/A 02/09/2015   Procedure: MYOMECTOMY,EXCISION OF ENDOMETRIOSIS,PRE-SACRAL NEURECTOMY;  Surgeon: Governor Specking, MD;  Location: [facility];  Service: Gynecology;  Laterality: N/A;  . ROBOTIC ASSISTED LAPAROSCOPIC LYSIS OF ADHESION N/A 06/09/2013   Procedure: ROBOTIC ASSISTED LAPAROSCOPIC LYSIS OF ADHESION; Resection of Endometriosis and excision of fibroid;  Surgeon: Marvene Staff, MD;  Location: [facility];  Service: Gynecology;  Laterality: N/A;  . TONSILLECTOMY      Family History  Problem Relation Age of Onset  . Heart disease Mother 47       CHF  . Crohn's disease Mother   . Asthma Mother   . Asthma Brother   . Heart disease Maternal Aunt   . Diabetes Maternal Aunt   . Stroke Maternal Aunt   . Heart disease Maternal Grandfather   . Stroke Maternal Grandfather     Social History   Tobacco Use  . Smoking status: Never Smoker  . Smokeless tobacco: Never Used  Substance Use Topics  . Alcohol use: No  . Drug use: No    Allergies: No Known Allergies  Medications Prior to Admission  Medication Sig Dispense Refill Last Dose  . amphetamine-dextroamphetamine (ADDERALL) 30 MG tablet Take 30 mg  by mouth 2 (two) times daily.   Past Week at Unknown time  . cetirizine (ZYRTEC) 10 MG tablet Take 10 mg by mouth daily as needed for allergies.   Past Week at Unknown time  . cloNIDine (CATAPRES) 0.2 MG tablet Take 0.3 mg by mouth at bedtime.   Past Week at Unknown time  . fexofenadine (ALLEGRA) 180 MG tablet Take 180-360 mg by mouth daily as needed for allergies.    unknown  . FLUoxetine (PROZAC) 20 MG capsule Take 20 mg by mouth at bedtime.    Past Week at Unknown time  . fluticasone (FLONASE) 50 MCG/ACT nasal spray Place 2 sprays into both nostrils daily. (Patient not taking: Reported on 06/10/2017) 16 g 6 Completed Course at Unknown time  . gabapentin (NEURONTIN) 300 MG capsule Take 1 capsule (300 mg total) by mouth 2 (two) times daily. (Patient not taking: Reported on  06/10/2017) 60 capsule 2 Completed Course at Unknown time  . ibuprofen (ADVIL,MOTRIN) 800 MG tablet Take 1 tablet (800 mg total) by mouth 3 (three) times daily. (Patient not taking: Reported on 06/10/2017) 21 tablet 0 Completed Course at Unknown time  . LORazepam (ATIVAN) 0.5 MG tablet Take 0.5 mg by mouth at bedtime.   Past Week at Unknown time  . methocarbamol (ROBAXIN) 500 MG tablet Take 1 tablet (500 mg total) by mouth 2 (two) times daily. (Patient not taking: Reported on 10/25/2015) 20 tablet 0 Completed Course at Unknown time  . ondansetron (ZOFRAN-ODT) 4 MG disintegrating tablet Take 1 tablet (4 mg total) by mouth every 8 (eight) hours as needed for nausea or vomiting. 8 tablet 0   . potassium chloride SA (K-DUR,KLOR-CON) 20 MEQ tablet Take 1 tablet (20 mEq total) by mouth 2 (two) times daily. 10 tablet 0     Review of Systems  Constitutional: Negative for fever.  Respiratory: Negative for shortness of breath.   Gastrointestinal: Positive for nausea. Negative for abdominal pain, constipation, diarrhea and vomiting.  Genitourinary: Negative for vaginal bleeding and vaginal discharge.  Musculoskeletal: Positive for myalgias.   Physical Exam   Blood pressure 136/86, pulse 87, temperature 98.4 F (36.9 C), resp. rate 18, height 5\' 7"  (1.702 m), weight 161 lb (73 kg), last menstrual period 05/15/2017.  Physical Exam  Nursing note and vitals reviewed. Constitutional: She is oriented to person, place, and time. She appears well-developed and well-nourished. No distress.  HENT:  Head: Normocephalic and atraumatic.  Cardiovascular: Normal rate.  Respiratory: Effort normal.  GI: Soft. She exhibits no distension and no mass. There is no tenderness. There is no rebound and no guarding.  Musculoskeletal:       Right knee: She exhibits normal range of motion, no swelling, no effusion, no ecchymosis, normal alignment, no LCL laxity, no bony tenderness, normal meniscus and no MCL laxity. Tenderness  found.  Neurological: She is alert and oriented to person, place, and time.  Skin: Skin is warm and dry. No erythema.  Psychiatric: She has a normal mood and affect.   Results for orders placed or performed during the hospital encounter of 10/25/17 (from the past 24 hour(s))  CBC with Differential/Platelet     Status: Abnormal   Collection Time: 10/25/17 10:30 PM  Result Value Ref Range   WBC 6.2 4.0 - 10.5 K/uL   RBC 3.42 (L) 3.87 - 5.11 MIL/uL   Hemoglobin 10.4 (L) 12.0 - 15.0 g/dL   HCT 29.6 (L) 36.0 - 46.0 %   MCV 86.5 78.0 - 100.0 fL  MCH 30.4 26.0 - 34.0 pg   MCHC 35.1 30.0 - 36.0 g/dL   RDW 14.4 11.5 - 15.5 %   Platelets 200 150 - 400 K/uL   Neutrophils Relative % 66 %   Neutro Abs 4.1 1.7 - 7.7 K/uL   Lymphocytes Relative 30 %   Lymphs Abs 1.9 0.7 - 4.0 K/uL   Monocytes Relative 2 %   Monocytes Absolute 0.2 0.1 - 1.0 K/uL   Eosinophils Relative 2 %   Eosinophils Absolute 0.1 0.0 - 0.7 K/uL   Basophils Relative 0 %   Basophils Absolute 0.0 0.0 - 0.1 K/uL  Comprehensive metabolic panel     Status: Abnormal   Collection Time: 10/25/17 10:30 PM  Result Value Ref Range   Sodium 135 135 - 145 mmol/L   Potassium 3.1 (L) 3.5 - 5.1 mmol/L   Chloride 105 101 - 111 mmol/L   CO2 23 22 - 32 mmol/L   Glucose, Bld 93 65 - 99 mg/dL   BUN 14 6 - 20 mg/dL   Creatinine, Ser 0.49 0.44 - 1.00 mg/dL   Calcium 8.3 (L) 8.9 - 10.3 mg/dL   Total Protein 6.1 (L) 6.5 - 8.1 g/dL   Albumin 3.2 (L) 3.5 - 5.0 g/dL   AST 14 (L) 15 - 41 U/L   ALT 8 (L) 14 - 54 U/L   Alkaline Phosphatase 48 38 - 126 U/L   Total Bilirubin 0.4 0.3 - 1.2 mg/dL   GFR calc non Af Amer >60 >60 mL/min   GFR calc Af Amer >60 >60 mL/min   Anion gap 7 5 - 15    MAU Course  Procedures None  MDM FHR - 170 bpm with doppler Patient requests recheck of K+ and Hgb given recent issues with anemia and hypokalemia. CBC shows mild anemia - no change in therapy. CMP shows K+ 3.1, patient declines K Dur today and will  follow-up with OB at [place] this week as planned.  Discussed patient with Dr. Rip Harbour. Agrees with plan for Lovenox tonight and LE venous doppler tomorrow at [facility]. Confirmed with on call vascular surgeon that vascular tech is available starting around 8:00 am. Order placed in Epic.   Assessment and Plan  A: SIUP at [redacted]w[redacted]d Right leg pain Right leg swelling, mild Hypokalemia   P: Discharge home Lovenox given today in MAU  Warning signs for worsening condition discussed Patient advised to follow-up at [facility] tower admissions tomorrow for LE venous doppler Patient may return to MAU as needed or if her condition were to change or worsen, however if worsening leg pain, swelling or SOB tonight advised to go to [facility] [place] for evaluation  Kerry Hough, PA-C 10/25/2017, 10:08 PM

## 2017-10-25 NOTE — MAU Note (Signed)
Swelling in R ankle and leg since yesterday. Having pain in back of R knee for couple wks. No vag bleeding or d/c. Some occ pain in R side.

## 2017-10-25 NOTE — Discharge Instructions (Signed)
Deep Vein Thrombosis Deep vein thrombosis (DVT) is a condition in which a blood clot forms in a deep vein, such as a lower leg, thigh, or arm vein. A clot is blood that has thickened into a gel or solid. This condition is dangerous. It can lead to serious and even life-threatening complications if the clot travels to the lungs and causes a blockage (pulmonary embolism). It can also damage veins in the leg. This can result in leg pain, swelling, discoloration, and sores (post-thrombotic syndrome). What are the causes? This condition may be caused by:  A slowdown of blood flow.  Damage to a vein.  A condition that makes blood clot more easily.  What increases the risk? The following factors may make you more likely to develop this condition:  Being overweight.  Being elderly, especially over age 60.  Sitting or lying down for more than four hours.  Lack of physical activity (sedentary lifestyle).  Being pregnant, giving birth, or having recently given birth.  Taking medicines that contain estrogen.  Smoking.  A history of any of the following: ? Blood clots or blood clotting disease. ? Peripheral vascular disease. ? Inflammatory bowel disease. ? Cancer. ? Heart disease. ? Genetic conditions that affect how blood clots. ? Neurological diseases that affect the legs (leg paresis). ? Injury. ? Major or lengthy surgery. ? A central line placed inside a large vein.  What are the signs or symptoms? Symptoms of this condition include:  Swelling, pain, or tenderness in an arm or leg.  Warmth, redness, or discoloration in an arm or leg.  If the clot is in your leg, symptoms may be more noticeable or worse when you stand or walk. Some people do not have any symptoms. How is this diagnosed? This condition is diagnosed with:  A medical history.  A physical exam.  Tests, such as: ? Blood tests. These are done to see how your blood clots. ? Imaging tests. These are done to  check for clots. Tests may include:  Ultrasound.  CT scan.  MRI.  X-ray.  Venogram. For this test, X-rays are taken after a dye is injected into a vein.  How is this treated? Treatment for this condition depends on the cause, your risk for bleeding or developing more clots, and any medical conditions you have. Treatment may include:  Taking blood thinners (also called anticoagulants). These medicines may be taken by mouth, injected under the skin, or injected through an IV tube (catheter). These medicines prevent clots from forming.  Injecting medicine that dissolves blood clots into the affected vein (catheter-directed thrombolysis).  Having surgery. Surgery may be done to: ? Remove the clot. ? Place a filter in a large vein to catch blood clots before they reach the lungs.  Some treatments may be continued for up to six months. Follow these instructions at home: If you are taking an oral blood thinner:  Take the medicine exactly as told by your health care provider. Some blood thinners need to be taken at the same time every day. Do not skip a dose.  Ask your health care provider about what foods and drugs interact with the medicine.  Ask about possible side effects. General instructions  Blood thinners can cause easy bruising and difficulty stopping bleeding. Because of this, if you are taking or were given a blood thinner: ? Hold pressure over cuts for longer than usual. ? Tell your dentist and other health care providers that you are taking blood thinners before   having any procedures that can cause bleeding. ? Avoid contact sports.  Take over-the-counter and prescription medicines only as told by your health care provider.  Return to your normal activities as told by your health care provider. Ask your health care provider what activities are safe for you.  Wear compression stockings if recommended by your health care provider.  Keep all follow-up visits as told by  your health care provider. This is important. How is this prevented? To lower your risk of developing this condition again:  For 30 or more minutes every day, do an activity that: ? Involves moving your arms and legs. ? Increases your heart rate.  When traveling for longer than four hours: ? Exercise your arms and legs every hour. ? Drink plenty of water. ? Avoid drinking alcohol.  Avoid sitting or lying for a long time without moving your legs.  Stay a healthy weight.  If you are a woman who is older than age 35, avoid unnecessary use of medicines that contain estrogen.  Do not use any products that contain nicotine or tobacco, such as cigarettes and e-cigarettes. This is especially important if you take estrogen medicines. If you need help quitting, ask your health care provider.  Contact a health care provider if:  You miss a dose of your blood thinner.  You have nausea, vomiting, or diarrhea that lasts for more than one day.  Your menstrual period is heavier than usual.  You have unusual bruising. Get help right away if:  You have new or increased pain, swelling, or redness in an arm or leg.  You have numbness or tingling in an arm or leg.  You have shortness of breath.  You have chest pain.  You have a rapid or irregular heartbeat.  You feel light-headed or dizzy.  You cough up blood.  There is blood in your vomit, stool, or urine.  You have a serious fall or accident, or you hit your head.  You have a severe headache or confusion.  You have a cut that will not stop bleeding. These symptoms may represent a serious problem that is an emergency. Do not wait to see if the symptoms will go away. Get medical help right away. Call your local emergency services (911 in the [place] Do not drive yourself to the hospital. Summary  DVT is a condition in which a blood clot forms in a deep vein, such as a lower leg, thigh, or arm vein.  Symptoms can include swelling,  warmth, pain, and redness in your leg or arm.  Treatment may include taking blood thinners, injecting medicine that dissolves blood clots,wearing compression stockings, or surgery.  If you are prescribed blood thinners, take them exactly as told. This information is not intended to replace advice given to you by your health care provider. Make sure you discuss any questions you have with your health care provider. Document Released: 09/10/2005 Document Revised: 10/13/2016 Document Reviewed: 10/13/2016 Elsevier Interactive Patient Education  2018 [employer] Inc[employer]  

## 2017-10-26 ENCOUNTER — Ambulatory Visit (HOSPITAL_BASED_OUTPATIENT_CLINIC_OR_DEPARTMENT_OTHER)
Admission: RE | Admit: 2017-10-26 | Discharge: 2017-10-26 | Disposition: A | Discharge status: Home / Self Care | Payer: Commercial Managed Care - PPO | Source: Ambulatory Visit | Attending: Obstetrics and Gynecology | Admitting: Obstetrics and Gynecology

## 2017-10-26 ENCOUNTER — Other Ambulatory Visit: Payer: Self-pay | Admitting: Obstetrics and Gynecology

## 2017-10-26 DIAGNOSIS — M7989 Other specified soft tissue disorders: Secondary | ICD-10-CM | POA: Insufficient documentation

## 2017-10-26 DIAGNOSIS — M79604 Pain in right leg: Secondary | ICD-10-CM | POA: Insufficient documentation

## 2017-10-26 NOTE — Progress Notes (Signed)
Per Vickey Huger note. Patient needs order for right LE venous doppler. Order placed.     Lezlie Lye, NP 10/26/2017 10:17 AM

## 2017-10-26 NOTE — Progress Notes (Signed)
VASCULAR LAB PRELIMINARY  PRELIMINARY  PRELIMINARY  PRELIMINARY  Right lower extremity venous duplex completed.    Preliminary report:  There is no DVT, SVT, or Baker's cyst noted in the right lower extremity.  Enlarged inguinal lymph nodes noted.   Zaire Levesque, RVT 10/26/2017, 10:58 AM

## 2017-11-06 LAB — CYTOLOGY - PAP
Cystic Fibrosis Profile: NEGATIVE
Pap: NEGATIVE

## 2017-12-06 ENCOUNTER — Inpatient Hospital Stay (HOSPITAL_COMMUNITY)
Admission: AD | Admit: 2017-12-06 | Discharge: 2017-12-06 | Disposition: A | Discharge status: Home / Self Care | Payer: Commercial Managed Care - PPO | Source: Ambulatory Visit | Attending: Obstetrics & Gynecology | Admitting: Obstetrics & Gynecology

## 2017-12-06 ENCOUNTER — Encounter (HOSPITAL_COMMUNITY): Payer: Self-pay | Admitting: *Deleted

## 2017-12-06 ENCOUNTER — Inpatient Hospital Stay (HOSPITAL_COMMUNITY): Payer: Commercial Managed Care - PPO

## 2017-12-06 DIAGNOSIS — R109 Unspecified abdominal pain: Secondary | ICD-10-CM | POA: Diagnosis not present

## 2017-12-06 DIAGNOSIS — O219 Vomiting of pregnancy, unspecified: Secondary | ICD-10-CM

## 2017-12-06 DIAGNOSIS — O99612 Diseases of the digestive system complicating pregnancy, second trimester: Secondary | ICD-10-CM | POA: Diagnosis not present

## 2017-12-06 DIAGNOSIS — F419 Anxiety disorder, unspecified: Secondary | ICD-10-CM | POA: Diagnosis not present

## 2017-12-06 DIAGNOSIS — R51 Headache: Secondary | ICD-10-CM | POA: Diagnosis present

## 2017-12-06 DIAGNOSIS — Z79899 Other long term (current) drug therapy: Secondary | ICD-10-CM | POA: Diagnosis not present

## 2017-12-06 DIAGNOSIS — O09522 Supervision of elderly multigravida, second trimester: Secondary | ICD-10-CM | POA: Diagnosis not present

## 2017-12-06 DIAGNOSIS — K5090 Crohn's disease, unspecified, without complications: Secondary | ICD-10-CM | POA: Diagnosis not present

## 2017-12-06 DIAGNOSIS — Z3A17 17 weeks gestation of pregnancy: Secondary | ICD-10-CM

## 2017-12-06 DIAGNOSIS — O26892 Other specified pregnancy related conditions, second trimester: Secondary | ICD-10-CM | POA: Insufficient documentation

## 2017-12-06 DIAGNOSIS — Z3492 Encounter for supervision of normal pregnancy, unspecified, second trimester: Secondary | ICD-10-CM

## 2017-12-06 DIAGNOSIS — R102 Pelvic and perineal pain: Secondary | ICD-10-CM | POA: Insufficient documentation

## 2017-12-06 DIAGNOSIS — O26899 Other specified pregnancy related conditions, unspecified trimester: Secondary | ICD-10-CM

## 2017-12-06 DIAGNOSIS — O99342 Other mental disorders complicating pregnancy, second trimester: Secondary | ICD-10-CM | POA: Insufficient documentation

## 2017-12-06 LAB — URINALYSIS, ROUTINE W REFLEX MICROSCOPIC
Bilirubin Urine: NEGATIVE
Glucose, UA: 50 mg/dL — AB
Hgb urine dipstick: NEGATIVE
Ketones, ur: 20 mg/dL — AB
Leukocytes, UA: NEGATIVE
Nitrite: NEGATIVE
Protein, ur: NEGATIVE mg/dL
Specific Gravity, Urine: 1.011 (ref 1.005–1.030)
pH: 6 (ref 5.0–8.0)

## 2017-12-06 MED ORDER — METOCLOPRAMIDE HCL 10 MG PO TABS: 10.0000 mg | ORAL_TABLET | Freq: Three times a day (TID) | ORAL | 0 refills | Status: DC | PRN

## 2017-12-06 MED ORDER — ONDANSETRON 8 MG PO TBDP
8.0000 mg | ORAL_TABLET | Freq: Once | ORAL | Status: AC
  Administered 2017-12-06: 8 mg via ORAL
  Filled 2017-12-06: qty 1

## 2017-12-06 MED ORDER — ACETAMINOPHEN 500 MG PO TABS
1000.0000 mg | ORAL_TABLET | Freq: Once | ORAL | Status: AC
  Administered 2017-12-06: 1000 mg via ORAL
  Filled 2017-12-06: qty 2

## 2017-12-06 NOTE — MAU Provider Note (Signed)
History     CSN: [id number]  Arrival date and time: 12/06/17 9485   First Provider Initiated Contact with Patient 12/06/17 2008      Chief Complaint  Patient presents with  . Headache  . Abdominal Pain   HPI  Diane [name] is a 37 y.o. G3P2002 at [redacted]w[redacted]d who presents with headache and abdominal pain. Reports sporadic abdominal pain & tightening today. States she felt the pain 5 times between 12 & 130 today. Then felt again at 5 pm and again when the nurse was checking her in. Has some bilateral lower abdominal pain that is worse with walking and position changes & some abdominal tightening. Endorses nausea during the pregnancy that she is taking diclegis for. Has not vomited today. Denies diarrhea, fever/chills, dysuria, vaginal bleeding, or LOF.  Has had a headache since last night. Describes frontal aching that she rates 3/10. Has not treated headache.  Gets prenatal care in [place].   OB History    Gravida Para Term Preterm AB Living   3 2 2     2    SAB TAB Ectopic Multiple Live Births                  Past Medical History:  Diagnosis Date  . Anemia   . Anxiety   . Crohn's disease ([place])   . Endometriosis   . Fibroid   . H/O blood clots    tested positive but not located on doppler   . Headache(784.0)    otc meds - last migraine 2009  . Heart murmur    as child, never had any problems  . History of endometriosis   . Low blood potassium    history  . Migraine without aura, with intractable migraine, so stated, without mention of status migrainosus 06/19/2013  . Pneumonia    history back in 2006  . PONV (postoperative nausea and vomiting)   . Seasonal allergies   . Vaginal Pap smear, abnormal   . Vasovagal syncope     Past Surgical History:  Procedure Laterality Date  . ADENOIDECTOMY    . DILATATION & CURRETTAGE/HYSTEROSCOPY WITH RESECTOCOPE N/A 06/09/2013   Procedure: DILATATION & CURETTAGE/HYSTEROSCOPY WITH HYSTEROSCOPIC RESECTION OF SUBMUCOSAL FIBROID AND  ENDOMETRIAL POLYP;  Surgeon: Marvene Staff, MD;  Location: [facility];  Service: Gynecology;  Laterality: N/A;  . LAPAROSCOPY N/A 06/09/2013   Procedure: LAPAROSCOPY DIAGNOSTIC;  Surgeon: Marvene Staff, MD;  Location: [facility];  Service: Gynecology;  Laterality: N/A;  . ROBOT ASSISTED MYOMECTOMY N/A 02/09/2015   Procedure: MYOMECTOMY,EXCISION OF ENDOMETRIOSIS,PRE-SACRAL NEURECTOMY;  Surgeon: Governor Specking, MD;  Location: [facility];  Service: Gynecology;  Laterality: N/A;  . ROBOTIC ASSISTED LAPAROSCOPIC LYSIS OF ADHESION N/A 06/09/2013   Procedure: ROBOTIC ASSISTED LAPAROSCOPIC LYSIS OF ADHESION; Resection of Endometriosis and excision of fibroid;  Surgeon: Marvene Staff, MD;  Location: [facility];  Service: Gynecology;  Laterality: N/A;  . TONSILLECTOMY      Family History  Problem Relation Age of Onset  . Heart disease Mother 66       CHF  . Crohn's disease Mother   . Asthma Mother   . Asthma Brother   . Heart disease Maternal Aunt   . Diabetes Maternal Aunt   . Stroke Maternal Aunt   . Heart disease Maternal Grandfather   . Stroke Maternal Grandfather     Social History   Tobacco Use  . Smoking status: Never Smoker  . Smokeless tobacco: Never Used  Substance Use  Topics  . Alcohol use: No  . Drug use: No    Allergies: No Known Allergies  Medications Prior to Admission  Medication Sig Dispense Refill Last Dose  . amphetamine-dextroamphetamine (ADDERALL) 30 MG tablet Take 30 mg by mouth 2 (two) times daily.   Past Week at Unknown time  . cetirizine (ZYRTEC) 10 MG tablet Take 10 mg by mouth daily as needed for allergies.   Past Week at Unknown time  . cloNIDine (CATAPRES) 0.2 MG tablet Take 0.3 mg by mouth at bedtime.   Past Week at Unknown time  . fexofenadine (ALLEGRA) 180 MG tablet Take 180-360 mg by mouth daily as needed for allergies.    unknown  . FLUoxetine (PROZAC) 20 MG capsule Take 20 mg by mouth at bedtime.    Past Week at Unknown time  . fluticasone  (FLONASE) 50 MCG/ACT nasal spray Place 2 sprays into both nostrils daily. (Patient not taking: Reported on 06/10/2017) 16 g 6 Completed Course at Unknown time  . gabapentin (NEURONTIN) 300 MG capsule Take 1 capsule (300 mg total) by mouth 2 (two) times daily. (Patient not taking: Reported on 06/10/2017) 60 capsule 2 Completed Course at Unknown time  . LORazepam (ATIVAN) 0.5 MG tablet Take 0.5 mg by mouth at bedtime.   Past Week at Unknown time  . methocarbamol (ROBAXIN) 500 MG tablet Take 1 tablet (500 mg total) by mouth 2 (two) times daily. (Patient not taking: Reported on 10/25/2015) 20 tablet 0 Completed Course at Unknown time  . ondansetron (ZOFRAN-ODT) 4 MG disintegrating tablet Take 1 tablet (4 mg total) by mouth every 8 (eight) hours as needed for nausea or vomiting. 8 tablet 0   . potassium chloride SA (K-DUR,KLOR-CON) 20 MEQ tablet Take 1 tablet (20 mEq total) by mouth 2 (two) times daily. 10 tablet 0     Review of Systems  Constitutional: Negative.   Gastrointestinal: Positive for abdominal pain and nausea. Negative for constipation, diarrhea and vomiting.  Genitourinary: Negative.   Neurological: Positive for headaches.   Physical Exam   Blood pressure 128/77, pulse 82, temperature 98.9 F (37.2 C), resp. rate 18, height 5\' 7"  (1.702 m), weight 164 lb (74.4 kg), last menstrual period 05/15/2017.  Physical Exam  Nursing note and vitals reviewed. Constitutional: She is oriented to person, place, and time. She appears well-developed and well-nourished. No distress.  HENT:  Head: Normocephalic and atraumatic.  Eyes: Conjunctivae are normal. Right eye exhibits no discharge. Left eye exhibits no discharge. No scleral icterus.  Neck: Normal range of motion.  Cardiovascular: Normal rate, regular rhythm and normal heart sounds.  No murmur heard. Respiratory: Effort normal and breath sounds normal. No respiratory distress. She has no wheezes.  GI: Soft. There is no tenderness.   Genitourinary:  Genitourinary Comments: Dilation: Closed Exam by:: Jorje Guild NP   Neurological: She is alert and oriented to person, place, and time.  Skin: Skin is warm and dry. She is not diaphoretic.  Psychiatric: She has a normal mood and affect. Her behavior is normal. Judgment and thought content normal.    MAU Course  Procedures Results for orders placed or performed during the hospital encounter of 12/06/17 (from the past 24 hour(s))  Urinalysis, Routine w reflex microscopic     Status: Abnormal   Collection Time: 12/06/17  7:23 PM  Result Value Ref Range   Color, Urine YELLOW YELLOW   APPearance CLEAR CLEAR   Specific Gravity, Urine 1.011 1.005 - 1.030   pH 6.0 5.0 -  8.0   Glucose, UA 50 (A) NEGATIVE mg/dL   Hgb urine dipstick NEGATIVE NEGATIVE   Bilirubin Urine NEGATIVE NEGATIVE   Ketones, ur 20 (A) NEGATIVE mg/dL   Protein, ur NEGATIVE NEGATIVE mg/dL   Nitrite NEGATIVE NEGATIVE   Leukocytes, UA NEGATIVE NEGATIVE    MDM FHT 160 by doppler Cervix closed & CL 3.9 cm Zofran & tylenol --- symptoms improved  Assessment and Plan  A; 1. Fetal heart tones present, second trimester   2. [redacted] weeks gestation of pregnancy   3. Abdominal pain affecting pregnancy   4. Nausea and vomiting during pregnancy prior to [redacted] weeks gestation   5. Pain of round ligament affecting pregnancy, antepartum    P: Discharge home Rx reglan F/u with OB Discussed reasons to return to [place] 12/06/2017, 8:08 PM

## 2017-12-06 NOTE — Discharge Instructions (Signed)

## 2017-12-06 NOTE — MAU Note (Signed)
Pt reports she started having an really bad headache last night and abd pain.  Today the abd pain is had feeling hard  3-4 in 1.5hr. Drank some water laied down. Headache  A litte better. Reports haviing increase pelvic pressure and felt the tighting again around 5:45. Still feeling pelvic pressure. And had a cramp in triage. Pt gets care in [place] but lives in [place].

## 2018-01-21 ENCOUNTER — Ambulatory Visit (INDEPENDENT_AMBULATORY_CARE_PROVIDER_SITE_OTHER): Payer: Medicaid Other | Admitting: Student

## 2018-01-21 ENCOUNTER — Encounter: Payer: Self-pay | Admitting: Student

## 2018-01-21 ENCOUNTER — Encounter: Payer: Self-pay | Admitting: *Deleted

## 2018-01-21 VITALS — BP 126/77 | HR 88 | Wt 175.1 lb

## 2018-01-21 DIAGNOSIS — Z3480 Encounter for supervision of other normal pregnancy, unspecified trimester: Secondary | ICD-10-CM

## 2018-01-21 DIAGNOSIS — G43019 Migraine without aura, intractable, without status migrainosus: Secondary | ICD-10-CM

## 2018-01-21 DIAGNOSIS — Z9889 Other specified postprocedural states: Secondary | ICD-10-CM

## 2018-01-21 DIAGNOSIS — Z3482 Encounter for supervision of other normal pregnancy, second trimester: Secondary | ICD-10-CM

## 2018-01-21 DIAGNOSIS — O09529 Supervision of elderly multigravida, unspecified trimester: Secondary | ICD-10-CM | POA: Insufficient documentation

## 2018-01-21 DIAGNOSIS — O0990 Supervision of high risk pregnancy, unspecified, unspecified trimester: Secondary | ICD-10-CM | POA: Insufficient documentation

## 2018-01-21 DIAGNOSIS — O09522 Supervision of elderly multigravida, second trimester: Secondary | ICD-10-CM

## 2018-01-21 LAB — POCT URINALYSIS DIP (DEVICE)
Bilirubin Urine: NEGATIVE
Glucose, UA: NEGATIVE mg/dL
Hgb urine dipstick: NEGATIVE
Ketones, ur: NEGATIVE mg/dL
Nitrite: NEGATIVE
Protein, ur: 30 mg/dL — AB
Specific Gravity, Urine: 1.015 (ref 1.005–1.030)
Urobilinogen, UA: 1 mg/dL (ref 0.0–1.0)
pH: 7 (ref 5.0–8.0)

## 2018-01-21 NOTE — Progress Notes (Signed)
  Subjective:    Diane [name] is being seen today for her first obstetrical visit.  This is not a planned pregnancy. She is at 20w5dgestation. Her obstetrical history is significant for Crohns, endometriosis, myomectomies. . Relationship with FOB: father of baby is not involved. . Patient does intend to breast feed. Pregnancy history fully reviewed.  Patient reports no complaints.  Review of Systems:   Review of Systems  HENT: Negative.   Respiratory: Negative.   Cardiovascular: Negative.   Gastrointestinal: Negative.   Genitourinary: Negative.   Musculoskeletal: Negative.   Neurological: Negative.   Hematological: Negative.   Psychiatric/Behavioral: Negative.     Objective:     BP 126/77   Pulse 88   Wt 175 lb 1.6 oz (79.4 kg)   LMP 05/15/2017   BMI 27.42 kg/m  Physical Exam  Constitutional: She is oriented to person, place, and time. She appears well-developed.  HENT:  Head: Normocephalic.  Neck: Normal range of motion.  Cardiovascular: Normal rate.  Respiratory: Effort normal.  GI: Soft.  Musculoskeletal: Normal range of motion.  Neurological: She is alert and oriented to person, place, and time.  Skin: Skin is warm and dry.    Exam    Assessment:    Pregnancy: [id number] Active Problem List   Diagnosis Date Noted  . Supervision of other normal pregnancy, antepartum 01/21/2018  . Low back pain radiating to left leg 10/04/2015  . Leiomyoma 02/09/2015  . Intractable migraine without aura 06/19/2013  . Vertigo 06/13/2013  . Headache 06/13/2013  . Syncope 06/13/2013  . History of endometriosis 06/13/2013  . Menorrhagia 06/12/2013  . Hypokalemia 05/16/2011  . [place] 07/27/2009  . DIARRHEA 07/27/2009  . ABDOMINAL PAIN RIGHT LOWER QUADRANT 07/27/2009  . Other constipation 08/09/2008  . HEMORRHAGE OF RECTUM AND ANUS 08/09/2008  . GASTROESOPHAGEAL REFLUX DISEASE, MILD 07/12/2008  . CROHN'S DISEASE 07/12/2008        Plan:     Initial labs drawn. Prenatal vitamins. Problem list reviewed and updated. Genetic testing was normal; [place] had several [place]. [place] and  Confirmed normal anatomy.   Role of ultrasound in pregnancy discussed; fetal survey: not indicated. .  Amniocentesis discussed: not indicated.  Follow up in 4 weeks.  50% of 30 min visit spent on counseling and coordination of care.    Welcomed patient to practice; oriented patient to Midwives, NPs and MD rotation.   Plan for 37 week c-section due to history of Myomectomy.   Patient wants her potassium, iron and CBC rechecked today. Labs ordered.   Recommended Floradix and Miralax due to Crohns and constipation.   [facility]ervyn SkeetersKO[facility]4/30/2019

## 2018-01-21 NOTE — Patient Instructions (Addendum)
-Recommend Floradix (liquid iron) and Miralax.   Cesarean Delivery Cesarean birth, or cesarean delivery, is the surgical delivery of a baby through an incision in the abdomen and the uterus. This may be referred to as a C-section. This procedure may be scheduled ahead of time, or it may be done in an emergency situation. Tell a health care provider about:  Any allergies you have.  All medicines you are taking, including vitamins, herbs, eye drops, creams, and over-the-counter medicines.  Any problems you or family members have had with anesthetic medicines.  Any blood disorders you have.  Any surgeries you have had.  Any medical conditions you have.  Whether you or any members of your family have a history of deep vein thrombosis (DVT) or pulmonary embolism (PE). What are the risks? Generally, this is a safe procedure. However, problems may occur, including:  Infection.  Bleeding.  Allergic reactions to medicines.  Damage to other structures or organs.  Blood clots.  Injury to your baby.  What happens before the procedure?  Follow instructions from your health care provider about eating or drinking restrictions.  Follow instructions from your health care provider about bathing before your procedure to help reduce your risk of infection.  If you know that you are going to have a cesarean delivery, do not shave your pubic area. Shaving before the procedure may increase your risk of infection.  Ask your health care provider about: ? Changing or stopping your regular medicines. This is especially important if you are taking diabetes medicines or blood thinners. ? Your pain management plan. This is especially important if you plan to breastfeed your baby. ? How long you will be in the hospital after the procedure. ? Any concerns you may have about receiving blood products if you need them during the procedure. ? Cord blood banking, if you plan to collect your baby's  umbilical cord blood.  You may also want to ask your health care provider: ? Whether you will be able to hold or breastfeed your baby while you are still in the operating room. ? Whether your baby can stay with you immediately after the procedure and during your recovery. ? Whether a family member or a person of your choice can go with you into the operating room and stay with you during the procedure, immediately after the procedure, and during your recovery.  Plan to have someone drive you home when you are discharged from the hospital. What happens during the procedure?  Fetal monitors will be placed on your abdomen to monitor your heart rate and your baby's heart rate.  Depending on the reason for your cesarean delivery, you may have a physical exam or additional testing, such as an ultrasound.  An IV tube will be inserted into one of your veins.  You may have your blood or urine tested.  You will be given antibiotic medicine to help prevent infection.  You may be given a special warming gown to wear to keep your temperature stable.  Hair may be removed from your pubic area.  The skin of your pubic area and lower abdomen will be cleaned with a germ-killing solution (antiseptic).  A catheter may be inserted into your bladder through your urethra. This drains your urine during the procedure.  You may be given one or more of the following: ? A medicine to numb the area (local anesthetic). ? A medicine to make you fall asleep (general anesthetic). ? A medicine (regional anesthetic) that is  injected into your back or through a small thin tube placed in your back (spinal anesthetic or epidural anesthetic). This numbs everything below the injection site and allows you to stay awake during your procedure. If this makes you feel nauseous, tell your health care provider. Medicines will be available to help reduce any nausea you may feel.  An incision will be made in your abdomen, and then  in your uterus.  If you are awake during your procedure, you may feel tugging and pulling in your abdomen, but you should not feel pain. If you feel pain, tell your health care provider immediately.  Your baby will be removed from your uterus. You may feel more pressure or pushing while this happens.  Immediately after birth, your baby will be dried and kept warm. You may be able to hold and breastfeed your baby. The umbilical cord may be clamped and cut during this time.  Your placenta will be removed from your uterus.  Your incisions will be closed with stitches (sutures). Staples, skin glue, or adhesive strips may also be applied to the incision in your abdomen.  Bandages (dressings) will be placed over the incision in your abdomen. The procedure may vary among health care providers and hospitals. What happens after the procedure?  Your blood pressure, heart rate, breathing rate, and blood oxygen level will be monitored often until the medicines you were given have worn off.  You may continue to receive fluids and medicines through an IV tube.  You will have some pain. Medicines will be available to help control your pain.  To help prevent blood clots: ? You may be given medicines. ? You may have to wear compression stockings or devices. ? You will be encouraged to walk around when you are able.  Hospital staff will encourage and support bonding with your baby. Your hospital may allow you and your baby to stay in the same room (rooming in) during your hospital stay to encourage successful breastfeeding.  You may be encouraged to cough and breathe deeply often. This helps to prevent lung problems.  If you have a catheter draining your urine, it will be removed as soon as possible after your procedure. This information is not intended to replace advice given to you by your health care provider. Make sure you discuss any questions you have with your health care provider. Document  Released: 09/10/2005 Document Revised: 02/16/2016 Document Reviewed: 06/21/2015 Elsevier Interactive Patient Education  [employer].

## 2018-01-22 LAB — COMPREHENSIVE METABOLIC PANEL
ALT: 17 IU/L (ref 0–32)
AST: 23 IU/L (ref 0–40)
Albumin/Globulin Ratio: 1.4 (ref 1.2–2.2)
Albumin: 3.3 g/dL — ABNORMAL LOW (ref 3.5–5.5)
Alkaline Phosphatase: 60 IU/L (ref 39–117)
BUN/Creatinine Ratio: 20 (ref 9–23)
BUN: 9 mg/dL (ref 6–20)
Bilirubin Total: 0.2 mg/dL (ref 0.0–1.2)
CO2: 22 mmol/L (ref 20–29)
Calcium: 7.9 mg/dL — ABNORMAL LOW (ref 8.7–10.2)
Chloride: 104 mmol/L (ref 96–106)
Creatinine, Ser: 0.46 mg/dL — ABNORMAL LOW (ref 0.57–1.00)
GFR calc Af Amer: 146 mL/min/{1.73_m2} (ref >59)
GFR calc non Af Amer: 127 mL/min/{1.73_m2} (ref >59)
Globulin, Total: 2.4 g/dL (ref 1.5–4.5)
Glucose: 76 mg/dL (ref 65–99)
Potassium: 3 mmol/L — ABNORMAL LOW (ref 3.5–5.2)
Sodium: 139 mmol/L (ref 134–144)
Total Protein: 5.7 g/dL — ABNORMAL LOW (ref 6.0–8.5)

## 2018-01-22 LAB — CBC
Hematocrit: 27.2 % — ABNORMAL LOW (ref 34.0–46.6)
Hemoglobin: 9.3 g/dL — ABNORMAL LOW (ref 11.1–15.9)
MCH: 30.2 pg (ref 26.6–33.0)
MCHC: 34.2 g/dL (ref 31.5–35.7)
MCV: 88 fL (ref 79–97)
Platelets: 221 10*3/uL (ref 150–379)
RBC: 3.08 x10E6/uL — ABNORMAL LOW (ref 3.77–5.28)
RDW: 14.6 % (ref 12.3–15.4)
WBC: 6.2 10*3/uL (ref 3.4–10.8)

## 2018-01-22 LAB — FERRITIN: Ferritin: 11 ng/mL — ABNORMAL LOW (ref 15–150)

## 2018-01-22 LAB — IRON AND TIBC
Iron Saturation: 10 % — ABNORMAL LOW (ref 15–55)
Iron: 42 ug/dL (ref 27–159)
Total Iron Binding Capacity: 414 ug/dL (ref 250–450)
UIBC: 372 ug/dL (ref 131–425)

## 2018-01-23 ENCOUNTER — Encounter: Payer: Self-pay | Admitting: *Deleted

## 2018-01-24 ENCOUNTER — Telehealth: Payer: Self-pay | Admitting: Student

## 2018-01-24 ENCOUNTER — Other Ambulatory Visit: Payer: Self-pay | Admitting: Student

## 2018-01-24 MED ORDER — FERROUS SULFATE 325 (65 FE) MG PO TBEC: 325.0000 mg | DELAYED_RELEASE_TABLET | Freq: Two times a day (BID) | ORAL | 3 refills | Status: DC

## 2018-01-24 NOTE — Telephone Encounter (Signed)
Called patient and left her voicemail that she is anemic. She should start to take floradix, as the iron pills may be too hard on GI tract (as we discussed). Explained where to buy floradix and how to take; she can call clinic or send a message if she has any questions. Diane [name]

## 2018-01-26 ENCOUNTER — Telehealth: Payer: Self-pay | Admitting: Student

## 2018-01-26 NOTE — Telephone Encounter (Signed)
Called patient and confirmed that she is going to start iron; she is planning to start taking today. Explained how to take supplements; all questions answered.  Diane [name] CNM

## 2018-01-29 ENCOUNTER — Encounter: Payer: Commercial Managed Care - PPO | Admitting: Obstetrics and Gynecology

## 2018-01-29 DIAGNOSIS — Z3480 Encounter for supervision of other normal pregnancy, unspecified trimester: Secondary | ICD-10-CM

## 2018-02-18 ENCOUNTER — Encounter: Payer: Self-pay | Admitting: Student

## 2018-02-18 DIAGNOSIS — O09891 Supervision of other high risk pregnancies, first trimester: Secondary | ICD-10-CM

## 2018-02-19 ENCOUNTER — Encounter: Payer: Self-pay | Admitting: *Deleted

## 2018-02-19 ENCOUNTER — Ambulatory Visit (INDEPENDENT_AMBULATORY_CARE_PROVIDER_SITE_OTHER): Payer: Medicaid Other | Admitting: Student

## 2018-02-19 ENCOUNTER — Other Ambulatory Visit: Payer: Medicaid Other

## 2018-02-19 VITALS — BP 126/69 | HR 92 | Wt 181.2 lb

## 2018-02-19 DIAGNOSIS — Z9889 Other specified postprocedural states: Secondary | ICD-10-CM | POA: Diagnosis not present

## 2018-02-19 DIAGNOSIS — O09891 Supervision of other high risk pregnancies, first trimester: Secondary | ICD-10-CM

## 2018-02-19 DIAGNOSIS — Z3480 Encounter for supervision of other normal pregnancy, unspecified trimester: Secondary | ICD-10-CM

## 2018-02-19 DIAGNOSIS — Z23 Encounter for immunization: Secondary | ICD-10-CM

## 2018-02-19 DIAGNOSIS — O09529 Supervision of elderly multigravida, unspecified trimester: Secondary | ICD-10-CM

## 2018-02-19 NOTE — Patient Instructions (Signed)
Third Trimester of Pregnancy The third trimester is from week 28 through week 40 (months 7 through 9). The third trimester is a time when the unborn baby (fetus) is growing rapidly. At the end of the ninth month, the fetus is about 20 inches in length and weighs 6-10 pounds. Body changes during your third trimester Your body will continue to go through many changes during pregnancy. The changes vary from woman to woman. During the third trimester:  Your weight will continue to increase. You can expect to gain 25-35 pounds (11-16 kg) by the end of the pregnancy.  You may begin to get stretch marks on your hips, abdomen, and breasts.  You may urinate more often because the fetus is moving lower into your pelvis and pressing on your bladder.  You may develop or continue to have heartburn. This is caused by increased hormones that slow down muscles in the digestive tract.  You may develop or continue to have constipation because increased hormones slow digestion and cause the muscles that push waste through your intestines to relax.  You may develop hemorrhoids. These are swollen veins (varicose veins) in the rectum that can itch or be painful.  You may develop swollen, bulging veins (varicose veins) in your legs.  You may have increased body aches in the pelvis, back, or thighs. This is due to weight gain and increased hormones that are relaxing your joints.  You may have changes in your hair. These can include thickening of your hair, rapid growth, and changes in texture. Some women also have hair loss during or after pregnancy, or hair that feels dry or thin. Your hair will most likely return to normal after your baby is born.  Your breasts will continue to grow and they will continue to become tender. A yellow fluid (colostrum) may leak from your breasts. This is the first milk you are producing for your baby.  Your belly button may stick out.  You may notice more swelling in your hands,  face, or ankles.  You may have increased tingling or numbness in your hands, arms, and legs. The skin on your belly may also feel numb.  You may feel short of breath because of your expanding uterus.  You may have more problems sleeping. This can be caused by the size of your belly, increased need to urinate, and an increase in your body's metabolism.  You may notice the fetus "dropping," or moving lower in your abdomen (lightening).  You may have increased vaginal discharge.  You may notice your joints feel loose and you may have pain around your pelvic bone.  What to expect at prenatal visits You will have prenatal exams every 2 weeks until week 36. Then you will have weekly prenatal exams. During a routine prenatal visit:  You will be weighed to make sure you and the baby are growing normally.  Your blood pressure will be taken.  Your abdomen will be measured to track your baby's growth.  The fetal heartbeat will be listened to.  Any test results from the previous visit will be discussed.  You may have a cervical check near your due date to see if your cervix has softened or thinned (effaced).  You will be tested for Group B streptococcus. This happens between 35 and 37 weeks.  Your health care provider may ask you:  What your birth plan is.  How you are feeling.  If you are feeling the baby move.  If you have had   any abnormal symptoms, such as leaking fluid, bleeding, severe headaches, or abdominal cramping.  If you are using any tobacco products, including cigarettes, chewing tobacco, and electronic cigarettes.  If you have any questions.  Other tests or screenings that may be performed during your third trimester include:  Blood tests that check for low iron levels (anemia).  Fetal testing to check the health, activity level, and growth of the fetus. Testing is done if you have certain medical conditions or if there are problems during the  pregnancy.  Nonstress test (NST). This test checks the health of your baby to make sure there are no signs of problems, such as the baby not getting enough oxygen. During this test, a belt is placed around your belly. The baby is made to move, and its heart rate is monitored during movement.  What is false labor? False labor is a condition in which you feel small, irregular tightenings of the muscles in the womb (contractions) that usually go away with rest, changing position, or drinking water. These are called Braxton Hicks contractions. Contractions may last for hours, days, or even weeks before true labor sets in. If contractions come at regular intervals, become more frequent, increase in intensity, or become painful, you should see your health care provider. What are the signs of labor?  Abdominal cramps.  Regular contractions that start at 10 minutes apart and become stronger and more frequent with time.  Contractions that start on the top of the uterus and spread down to the lower abdomen and back.  Increased pelvic pressure and dull back pain.  A watery or bloody mucus discharge that comes from the vagina.  Leaking of amniotic fluid. This is also known as your "water breaking." It could be a slow trickle or a gush. Let your health care provider know if it has a color or strange odor. If you have any of these signs, call your health care provider right away, even if it is before your due date. Follow these instructions at home: Medicines  Follow your health care provider's instructions regarding medicine use. Specific medicines may be either safe or unsafe to take during pregnancy.  Take a prenatal vitamin that contains at least 600 micrograms (mcg) of folic acid.  If you develop constipation, try taking a stool softener if your health care provider approves. Eating and drinking  Eat a balanced diet that includes fresh fruits and vegetables, whole grains, good sources of protein  such as meat, eggs, or tofu, and low-fat dairy. Your health care provider will help you determine the amount of weight gain that is right for you.  Avoid raw meat and uncooked cheese. These carry germs that can cause birth defects in the baby.  If you have low calcium intake from food, talk to your health care provider about whether you should take a daily calcium supplement.  Eat four or five small meals rather than three large meals a day.  Limit foods that are high in fat and processed sugars, such as fried and sweet foods.  To prevent constipation: ? Drink enough fluid to keep your urine clear or pale yellow. ? Eat foods that are high in fiber, such as fresh fruits and vegetables, whole grains, and beans. Activity  Exercise only as directed by your health care provider. Most women can continue their usual exercise routine during pregnancy. Try to exercise for 30 minutes at least 5 days a week. Stop exercising if you experience uterine contractions.  Avoid heavy   lifting.  Do not exercise in extreme heat or humidity, or at high altitudes.  Wear low-heel, comfortable shoes.  Practice good posture.  You may continue to have sex unless your health care provider tells you otherwise. Relieving pain and discomfort  Take frequent breaks and rest with your legs elevated if you have leg cramps or low back pain.  Take warm sitz baths to soothe any pain or discomfort caused by hemorrhoids. Use hemorrhoid cream if your health care provider approves.  Wear a good support bra to prevent discomfort from breast tenderness.  If you develop varicose veins: ? Wear support pantyhose or compression stockings as told by your healthcare provider. ? Elevate your feet for 15 minutes, 3-4 times a day. Prenatal care  Write down your questions. Take them to your prenatal visits.  Keep all your prenatal visits as told by your health care provider. This is important. Safety  Wear your seat belt at  all times when driving.  Make a list of emergency phone numbers, including numbers for family, friends, the hospital, and police and fire departments. General instructions  Avoid cat litter boxes and soil used by cats. These carry germs that can cause birth defects in the baby. If you have a cat, ask someone to clean the litter box for you.  Do not travel far distances unless it is absolutely necessary and only with the approval of your health care provider.  Do not use hot tubs, steam rooms, or saunas.  Do not drink alcohol.  Do not use any products that contain nicotine or tobacco, such as cigarettes and e-cigarettes. If you need help quitting, ask your health care provider.  Do not use any medicinal herbs or unprescribed drugs. These chemicals affect the formation and growth of the baby.  Do not douche or use tampons or scented sanitary pads.  Do not cross your legs for long periods of time.  To prepare for the arrival of your baby: ? Take prenatal classes to understand, practice, and ask questions about labor and delivery. ? Make a trial run to the hospital. ? Visit the hospital and tour the maternity area. ? Arrange for maternity or paternity leave through employers. ? Arrange for family and friends to take care of pets while you are in the hospital. ? Purchase a rear-facing car seat and make sure you know how to install it in your car. ? Pack your hospital bag. ? Prepare the baby's nursery. Make sure to remove all pillows and stuffed animals from the baby's crib to prevent suffocation.  Visit your dentist if you have not gone during your pregnancy. Use a soft toothbrush to brush your teeth and be gentle when you floss. Contact a health care provider if:  You are unsure if you are in labor or if your water has broken.  You become dizzy.  You have mild pelvic cramps, pelvic pressure, or nagging pain in your abdominal area.  You have lower back pain.  You have persistent  nausea, vomiting, or diarrhea.  You have an unusual or bad smelling vaginal discharge.  You have pain when you urinate. Get help right away if:  Your water breaks before 37 weeks.  You have regular contractions less than 5 minutes apart before 37 weeks.  You have a fever.  You are leaking fluid from your vagina.  You have spotting or bleeding from your vagina.  You have severe abdominal pain or cramping.  You have rapid weight loss or weight gain.    You have shortness of breath with chest pain.  You notice sudden or extreme swelling of your face, hands, ankles, feet, or legs.  Your baby makes fewer than 10 movements in 2 hours.  You have severe headaches that do not go away when you take medicine.  You have vision changes. Summary  The third trimester is from week 28 through week 40, months 7 through 9. The third trimester is a time when the unborn baby (fetus) is growing rapidly.  During the third trimester, your discomfort may increase as you and your baby continue to gain weight. You may have abdominal, leg, and back pain, sleeping problems, and an increased need to urinate.  During the third trimester your breasts will keep growing and they will continue to become tender. A yellow fluid (colostrum) may leak from your breasts. This is the first milk you are producing for your baby.  False labor is a condition in which you feel small, irregular tightenings of the muscles in the womb (contractions) that eventually go away. These are called Braxton Hicks contractions. Contractions may last for hours, days, or even weeks before true labor sets in.  Signs of labor can include: abdominal cramps; regular contractions that start at 10 minutes apart and become stronger and more frequent with time; watery or bloody mucus discharge that comes from the vagina; increased pelvic pressure and dull back pain; and leaking of amniotic fluid. This information is not intended to replace advice  given to you by your health care provider. Make sure you discuss any questions you have with your health care provider. Document Released: 09/04/2001 Document Revised: 02/16/2016 Document Reviewed: 11/11/2012 Elsevier Interactive Patient Education  2017 [employer]  

## 2018-02-19 NOTE — Progress Notes (Signed)
   PRENATAL VISIT NOTE  Subjective:  Diane [name] is a 37 y.o. G3P2002 at [redacted]w[redacted]d being seen today for ongoing prenatal care.  She is currently monitored for the following issues for this high-risk pregnancy and has GASTROESOPHAGEAL REFLUX DISEASE, MILD; CROHN'S [place]; CROHN'S DISEASE; Hypokalemia; Vertigo; Headache; Syncope; History of endometriosis; Intractable migraine without aura; Leiomyoma; Low back pain radiating to left leg; Supervision of other normal pregnancy, antepartum; AMA (advanced maternal age) multigravida 35+; H/O myomectomy; and Medication exposure during first trimester of pregnancy on their problem list.  Patient reports no complaints.  Contractions: Irritability. Vag. Bleeding: None.  Movement: Present. Denies leaking of fluid.   The following portions of the patient's history were reviewed and updated as appropriate: allergies, current medications, past family history, past medical history, past social history, past surgical history and problem list. Problem list updated.  Objective:   Vitals:   02/19/18 0946  BP: 126/69  Pulse: 92  Weight: 181 lb 3.2 oz (82.2 kg)    Fetal Status: Fetal Heart Rate (bpm): 152 Fundal Height: 29 cm Movement: Present     General:  Alert, oriented and cooperative. Patient is in no acute distress.  Skin: Skin is warm and dry. No rash noted.   Cardiovascular: Normal heart rate noted  Respiratory: Normal respiratory effort, no problems with respiration noted  Abdomen: Soft, gravid, appropriate for gestational age.  Pain/Pressure: Present     Pelvic: Cervical exam deferred        Extremities: Normal range of motion.  Edema: Trace  Mental Status: Normal mood and affect. Normal behavior. Normal judgment and thought content.   Assessment and Plan:  Pregnancy: G3P2002 at [redacted]w[redacted]d  1. Supervision of other normal pregnancy, antepartum -discussed tx of anemia. Stopped taking iron d/t side effect of constipation which  is already an issue for her d/t crohn's. Discussed taking iron every 2-3 days instead of daily and continuing probiotics. May add stool softeners or laxative as necessary.  - Prenatal Vit-Fe Fumarate-FA (MULTIVITAMIN-PRENATAL) 27-0.8 MG TABS tablet; Take 1 tablet by mouth daily at 12 noon. - Tdap vaccine greater than or equal to 7yo IM  2. H/O myomectomy -discussed plans for primary c/section at 37 wks -will schedule with MD for next visit  3. Medication exposure during first trimester of pregnancy -used Adderall during pregnancy; discontinued in February. Doing well without medication.  - [place] MFM OB COMP + 14 WK; Future ---- growth ultrasound ordered   Preterm labor symptoms and general obstetric precautions including but not limited to vaginal bleeding, contractions, leaking of fluid and fetal movement were reviewed in detail with the patient. Please refer to After Visit Summary for other counseling recommendations.  Return in about 2 weeks (around 03/05/2018) for Routine OB.  Future Appointments  Date Time Provider [place]  02/28/2018  2:00 PM WH-MFC [place] 3 WH-MFCUS MFC-US  03/05/2018  9:15 AM Aletha Halim, MD [facility]    Jorje Guild, NP

## 2018-02-20 ENCOUNTER — Encounter (HOSPITAL_COMMUNITY): Payer: Self-pay

## 2018-02-20 LAB — HIV ANTIBODY (ROUTINE TESTING W REFLEX): HIV Screen 4th Generation wRfx: NONREACTIVE

## 2018-02-20 LAB — GLUCOSE TOLERANCE, 2 HOURS W/ 1HR
Glucose, 1 hour: 133 mg/dL (ref 65–179)
Glucose, 2 hour: 99 mg/dL (ref 65–152)
Glucose, Fasting: 71 mg/dL (ref 65–91)

## 2018-02-20 LAB — CBC
Hematocrit: 27.1 % — ABNORMAL LOW (ref 34.0–46.6)
Hemoglobin: 9.2 g/dL — ABNORMAL LOW (ref 11.1–15.9)
MCH: 30.4 pg (ref 26.6–33.0)
MCHC: 33.9 g/dL (ref 31.5–35.7)
MCV: 89 fL (ref 79–97)
Platelets: 188 10*3/uL (ref 150–450)
RBC: 3.03 x10E6/uL — ABNORMAL LOW (ref 3.77–5.28)
RDW: 13.6 % (ref 12.3–15.4)
WBC: 6.6 10*3/uL (ref 3.4–10.8)

## 2018-02-20 LAB — RPR: RPR Ser Ql: NONREACTIVE

## 2018-02-21 ENCOUNTER — Other Ambulatory Visit (HOSPITAL_COMMUNITY): Payer: Medicaid Other

## 2018-02-28 ENCOUNTER — Other Ambulatory Visit: Payer: Self-pay | Admitting: Student

## 2018-02-28 ENCOUNTER — Ambulatory Visit (HOSPITAL_COMMUNITY)
Admission: RE | Admit: 2018-02-28 | Discharge: 2018-02-28 | Disposition: A | Discharge status: Home / Self Care | Payer: Medicaid Other | Source: Ambulatory Visit | Attending: Student | Admitting: Student

## 2018-02-28 DIAGNOSIS — Z363 Encounter for antenatal screening for malformations: Secondary | ICD-10-CM

## 2018-02-28 DIAGNOSIS — O09522 Supervision of elderly multigravida, second trimester: Secondary | ICD-10-CM | POA: Insufficient documentation

## 2018-02-28 DIAGNOSIS — O09892 Supervision of other high risk pregnancies, second trimester: Secondary | ICD-10-CM | POA: Diagnosis not present

## 2018-02-28 DIAGNOSIS — Z3A29 29 weeks gestation of pregnancy: Secondary | ICD-10-CM | POA: Insufficient documentation

## 2018-02-28 DIAGNOSIS — O09891 Supervision of other high risk pregnancies, first trimester: Secondary | ICD-10-CM

## 2018-03-05 ENCOUNTER — Ambulatory Visit (INDEPENDENT_AMBULATORY_CARE_PROVIDER_SITE_OTHER): Payer: Medicaid Other | Admitting: Obstetrics and Gynecology

## 2018-03-05 VITALS — BP 124/69 | HR 96 | Wt 182.8 lb

## 2018-03-05 DIAGNOSIS — O09523 Supervision of elderly multigravida, third trimester: Secondary | ICD-10-CM

## 2018-03-05 DIAGNOSIS — O0990 Supervision of high risk pregnancy, unspecified, unspecified trimester: Secondary | ICD-10-CM

## 2018-03-05 DIAGNOSIS — K5090 Crohn's disease, unspecified, without complications: Secondary | ICD-10-CM

## 2018-03-05 DIAGNOSIS — Z9889 Other specified postprocedural states: Secondary | ICD-10-CM

## 2018-03-05 DIAGNOSIS — O99613 Diseases of the digestive system complicating pregnancy, third trimester: Secondary | ICD-10-CM

## 2018-03-05 MED ORDER — RANITIDINE HCL 150 MG PO TABS: 150.0000 mg | ORAL_TABLET | Freq: Two times a day (BID) | ORAL | 2 refills | Status: DC

## 2018-03-05 NOTE — Progress Notes (Signed)
Prenatal Visit Note Date: 03/05/2018 Clinic: [facility]  Subjective:  Diane [name] is a 37 y.o. G3P2002 at 54w6dbeing seen today for ongoing prenatal care.  She is currently monitored for the following issues for this high-risk pregnancy and has GASTROESOPHAGEAL REFLUX DISEASE, MILD; CROHN'S DG[place] Maternal Crohn's disease affecting pregnancy in third trimester (HCent[place]; Hypokalemia; Vertigo; Headache; History of endometriosis; Intractable migraine without aura; Leiomyoma; Low back pain radiating to left leg; Supervision of high risk pregnancy, antepartum; AMA (advanced maternal age) multigravida 35+; H/O myomectomy; and Medication exposure during first trimester of pregnancy on their problem list.  Patient reports no complaints.   Contractions: Irregular. Vag. Bleeding: None.  Movement: Present. Denies leaking of fluid.   The following portions of the patient's history were reviewed and updated as appropriate: allergies, current medications, past family history, past medical history, past social history, past surgical history and problem list. Problem list updated.  Objective:   Vitals:   03/05/18 0958  BP: 124/69  Pulse: 96  Weight: 182 lb 12.8 oz (82.9 kg)    Fetal Status: Fetal Heart Rate (bpm): 160 Fundal Height: 29 cm Movement: Present     General:  Alert, oriented and cooperative. Patient is in no acute distress.  Skin: Skin is warm and dry. No rash noted.   Cardiovascular: Normal heart rate noted  Respiratory: Normal respiratory effort, no problems with respiration noted  Abdomen: Soft, gravid, appropriate for gestational age. Pain/Pressure: Present     Pelvic:  Cervical exam deferred        Extremities: Normal range of motion.  Edema: Trace  Mental Status: Normal mood and affect. Normal behavior. Normal judgment and thought content.   Urinalysis:      Assessment and Plan:  Pregnancy: G3P2002 at 266w6d1. Multigravida of  advanced maternal age in third trimester No issues  2. H/O myomectomy Long d/w pt re: rationale for 37/0 c-section and she is amenable to this. Request sent for 8/1 c/s and to schedule visits with dr. anHarolyn Rutherfordince she's on that day. D/w pt re: BC and BTL and pt unsure; also d/w her re: vasectomy. Pt has medicaid and if considering a btl, recommend signing papers nv.   3. Maternal Crohn's disease affecting pregnancy in third trimester (HCZempleNo current issues and on no meds. Doesn't have a GI provider. Refer PRN  4. Supervision of high risk pregnancy, antepartum Completion growth u/s scheduled for next month - USKoreaFM OB FOLLOW UP; Future  Preterm labor symptoms and general obstetric precautions including but not limited to vaginal bleeding, contractions, leaking of fluid and fetal movement were reviewed in detail with the patient. Please refer to After Visit Summary for other counseling recommendations.  Return in about 2 weeks (around 03/19/2018) for w/ dr. anHarolyn Rutherford  PiAletha HalimMD

## 2018-03-05 NOTE — Progress Notes (Signed)
Patient reports she fell last Wednesday at work - seen at [employer] at her job - right shoulder pain-will be doing PT.

## 2018-03-11 ENCOUNTER — Encounter (HOSPITAL_COMMUNITY): Payer: Self-pay

## 2018-03-24 ENCOUNTER — Ambulatory Visit (INDEPENDENT_AMBULATORY_CARE_PROVIDER_SITE_OTHER): Payer: Medicaid Other | Admitting: Obstetrics & Gynecology

## 2018-03-24 ENCOUNTER — Encounter: Payer: Self-pay | Admitting: Obstetrics & Gynecology

## 2018-03-24 ENCOUNTER — Ambulatory Visit (HOSPITAL_COMMUNITY)
Admission: RE | Admit: 2018-03-24 | Discharge: 2018-03-24 | Disposition: A | Discharge status: Home / Self Care | Payer: Medicaid Other | Source: Ambulatory Visit | Attending: Family | Admitting: Family

## 2018-03-24 VITALS — BP 132/76 | HR 88 | Wt 189.5 lb

## 2018-03-24 DIAGNOSIS — O99613 Diseases of the digestive system complicating pregnancy, third trimester: Secondary | ICD-10-CM

## 2018-03-24 DIAGNOSIS — O09523 Supervision of elderly multigravida, third trimester: Secondary | ICD-10-CM

## 2018-03-24 DIAGNOSIS — O99013 Anemia complicating pregnancy, third trimester: Secondary | ICD-10-CM | POA: Diagnosis not present

## 2018-03-24 DIAGNOSIS — Z3A32 32 weeks gestation of pregnancy: Secondary | ICD-10-CM | POA: Diagnosis not present

## 2018-03-24 DIAGNOSIS — R609 Edema, unspecified: Secondary | ICD-10-CM | POA: Insufficient documentation

## 2018-03-24 DIAGNOSIS — K5090 Crohn's disease, unspecified, without complications: Secondary | ICD-10-CM

## 2018-03-24 DIAGNOSIS — O26893 Other specified pregnancy related conditions, third trimester: Secondary | ICD-10-CM | POA: Diagnosis not present

## 2018-03-24 DIAGNOSIS — O1203 Gestational edema, third trimester: Secondary | ICD-10-CM

## 2018-03-24 DIAGNOSIS — E876 Hypokalemia: Secondary | ICD-10-CM

## 2018-03-24 DIAGNOSIS — O0990 Supervision of high risk pregnancy, unspecified, unspecified trimester: Secondary | ICD-10-CM

## 2018-03-24 DIAGNOSIS — Z9889 Other specified postprocedural states: Secondary | ICD-10-CM

## 2018-03-24 DIAGNOSIS — O4703 False labor before 37 completed weeks of gestation, third trimester: Secondary | ICD-10-CM

## 2018-03-24 LAB — POCT URINALYSIS DIP (DEVICE)
Bilirubin Urine: NEGATIVE
Glucose, UA: NEGATIVE mg/dL
Hgb urine dipstick: NEGATIVE
Ketones, ur: NEGATIVE mg/dL
Leukocytes, UA: NEGATIVE
Nitrite: NEGATIVE
Protein, ur: 30 mg/dL — AB
Specific Gravity, Urine: >=1.03 (ref 1.005–1.030)
Urobilinogen, UA: 1 mg/dL (ref 0.0–1.0)
pH: 7 (ref 5.0–8.0)

## 2018-03-24 NOTE — Progress Notes (Signed)
Pt states her left side has been swelling including leg also she has been having some contractions.

## 2018-03-24 NOTE — Patient Instructions (Signed)
Return to clinic for any scheduled appointments or obstetric concerns, or go to MAU for evaluation  

## 2018-03-24 NOTE — Progress Notes (Signed)
   PRENATAL VISIT NOTE  Subjective:  Diane [name] is a 37 y.o. G3P2002 at 102w4dbeing seen today for ongoing prenatal care.  She is currently monitored for the following issues for this high-risk pregnancy and has GASTROESOPHAGEAL REFLUX DISEASE, MILD; CROHN'S [place]She[place] Maternal Crohn's disease affecting pregnancy in third trimester ([place]; Hypokalemia; Vertigo; Headache; History of endometriosis; Intractable migraine without aura; Leiomyoma; Low back pain radiating to left leg; Supervision of high risk pregnancy, antepartum; AMA (advanced maternal age) multigravida 35+; H/O myomectomy; and Medication exposure during first trimester of pregnancy on their problem list.  Patient reports occasional contractions and left lower extremity edema for a few days.  Contractions: Irritability. Vag. Bleeding: None.  Movement: Present. Denies leaking of fluid.   The following portions of the patient's history were reviewed and updated as appropriate: allergies, current medications, past family history, past medical history, past social history, past surgical history and problem list. Problem list updated.  Objective:   Vitals:   03/24/18 0907  BP: 132/76  Pulse: 88  Weight: 189 lb 8 oz (86 kg)    Fetal Status: Fetal Heart Rate (bpm): 146 Fundal Height: 32 cm Movement: Present     General:  Alert, oriented and cooperative. Patient is in no acute distress.  Skin: Skin is warm and dry. No rash noted.   Cardiovascular: Normal heart rate noted  Respiratory: Normal respiratory effort, no problems with respiration noted  Abdomen: Soft, gravid, appropriate for gestational age.  Pain/Pressure: Present     Pelvic: Cervical exam performed Dilation: Closed Effacement (%): Thick Station: Ballotable  Extremities: Normal range of motion.  Edema: Mild pitting, slight indentation 2-3+ on LLE compared to 2+ on RLE, no palpable cords.  Mental Status: Normal mood and affect. Normal behavior.  Normal judgment and thought content.   Assessment and Plan:  Pregnancy: G3P2002 at 359w4d1. Lower extremity edema during pregnancy in third trimester STAT BLE dopplers ordered, will follow up results and manage accordingly. - VAS USKoreaOWER EXTREMITY VENOUS (DVT); Future  2. Preterm uterine contractions in third trimester, antepartum Closed cervix, patient reassured  3. Hypokalemia Patient wanted this rechecked today. - Comprehensive metabolic panel  4. H/O myomectomy Cesarean section already scheduled at 37 weeks  5. Anemia affecting pregnancy in third trimester Not taking oral iron. Will check and determine if she needs IV iron, and how may doses needed. - CBC  6. Maternal Crohn's disease affecting pregnancy in third trimester (HCWaynesburgStable, no meds.  7.57Multigravida of advanced maternal age in third trimester Stable  8. Supervision of high risk pregnancy, antepartum Preterm labor symptoms and general obstetric precautions including but not limited to vaginal bleeding, contractions, leaking of fluid and fetal movement were reviewed in detail with the patient. Please refer to After Visit Summary for other counseling recommendations.  Return in about 2 weeks (around 04/07/2018) for OB Visit ([place]  Future Appointments  Date Time Provider [place]7/06/2018 10:15 AM WHCon[place]  UgVerita SchneidersMD

## 2018-03-25 LAB — COMPREHENSIVE METABOLIC PANEL
ALT: 8 IU/L (ref 0–32)
AST: 12 IU/L (ref 0–40)
Albumin/Globulin Ratio: 1.3 (ref 1.2–2.2)
Albumin: 3.1 g/dL — ABNORMAL LOW (ref 3.5–5.5)
Alkaline Phosphatase: 63 IU/L (ref 39–117)
BUN/Creatinine Ratio: 13 (ref 9–23)
BUN: 7 mg/dL (ref 6–20)
Bilirubin Total: 0.2 mg/dL (ref 0.0–1.2)
CO2: 20 mmol/L (ref 20–29)
Calcium: 7.9 mg/dL — ABNORMAL LOW (ref 8.7–10.2)
Chloride: 107 mmol/L — ABNORMAL HIGH (ref 96–106)
Creatinine, Ser: 0.55 mg/dL — ABNORMAL LOW (ref 0.57–1.00)
GFR calc Af Amer: 138 mL/min/{1.73_m2} (ref >59)
GFR calc non Af Amer: 119 mL/min/{1.73_m2} (ref >59)
Globulin, Total: 2.3 g/dL (ref 1.5–4.5)
Glucose: 75 mg/dL (ref 65–99)
Potassium: 3.1 mmol/L — ABNORMAL LOW (ref 3.5–5.2)
Sodium: 139 mmol/L (ref 134–144)
Total Protein: 5.4 g/dL — ABNORMAL LOW (ref 6.0–8.5)

## 2018-03-25 LAB — CBC
Hematocrit: 26.8 % — ABNORMAL LOW (ref 34.0–46.6)
Hemoglobin: 9 g/dL — ABNORMAL LOW (ref 11.1–15.9)
MCH: 28.8 pg (ref 26.6–33.0)
MCHC: 33.6 g/dL (ref 31.5–35.7)
MCV: 86 fL (ref 79–97)
Platelets: 205 10*3/uL (ref 150–450)
RBC: 3.12 x10E6/uL — ABNORMAL LOW (ref 3.77–5.28)
RDW: 14.2 % (ref 12.3–15.4)
WBC: 5.6 10*3/uL (ref 3.4–10.8)

## 2018-03-25 MED ORDER — POTASSIUM CHLORIDE CRYS ER 20 MEQ PO TBCR: 40.0000 meq | EXTENDED_RELEASE_TABLET | Freq: Every day | ORAL | 1 refills | Status: DC

## 2018-03-25 NOTE — Addendum Note (Signed)
Addended by: Verita Schneiders A on: 03/25/2018 10:43 AM   Modules accepted: Orders, SmartSet

## 2018-03-25 NOTE — Progress Notes (Signed)
CBC Latest Ref Rng & Units 03/24/2018 02/19/2018 01/21/2018  WBC 3.4 - 10.8 x10E3/uL 5.6 6.6 6.2  Hemoglobin 11.1 - 15.9 g/dL 9.0(L) 9.2(L) 9.3(L)  Hematocrit 34.0 - 46.6 % 26.8(L) 27.1(L) 27.2(L)  Platelets 150 - 450 x10E3/uL 205 188 221   Feraheme x 2 doses ordered for patient.  Please arrange for patient to get infusion at [facility] Day.    Verita Schneiders, MD, [place] for [employer], [place]

## 2018-03-26 ENCOUNTER — Encounter: Payer: Self-pay | Admitting: General Practice

## 2018-03-26 NOTE — Progress Notes (Unsigned)
Scheduled patient for feraheme infusion per Dr Harolyn Rutherford on 7/9 @ 11am. Will send mychart message to patient.

## 2018-04-01 ENCOUNTER — Ambulatory Visit (HOSPITAL_COMMUNITY)
Admission: RE | Admit: 2018-04-01 | Discharge: 2018-04-01 | Disposition: A | Discharge status: Home / Self Care | Payer: Medicaid Other | Source: Ambulatory Visit | Attending: Obstetrics & Gynecology | Admitting: Obstetrics & Gynecology

## 2018-04-01 DIAGNOSIS — O09523 Supervision of elderly multigravida, third trimester: Secondary | ICD-10-CM | POA: Diagnosis not present

## 2018-04-01 DIAGNOSIS — O99013 Anemia complicating pregnancy, third trimester: Secondary | ICD-10-CM | POA: Diagnosis present

## 2018-04-01 DIAGNOSIS — D649 Anemia, unspecified: Secondary | ICD-10-CM | POA: Diagnosis not present

## 2018-04-01 DIAGNOSIS — Z3A00 Weeks of gestation of pregnancy not specified: Secondary | ICD-10-CM | POA: Diagnosis not present

## 2018-04-01 MED ORDER — SODIUM CHLORIDE 0.9 % IV SOLN
510.0000 mg | INTRAVENOUS | Status: DC
  Administered 2018-04-01: 510 mg via INTRAVENOUS
  Filled 2018-04-01: qty 17

## 2018-04-01 NOTE — Discharge Instructions (Signed)

## 2018-04-02 ENCOUNTER — Ambulatory Visit (HOSPITAL_COMMUNITY)
Admission: RE | Admit: 2018-04-02 | Discharge: 2018-04-02 | Disposition: A | Discharge status: Home / Self Care | Payer: Medicaid Other | Source: Ambulatory Visit | Attending: Obstetrics and Gynecology | Admitting: Obstetrics and Gynecology

## 2018-04-02 DIAGNOSIS — O09513 Supervision of elderly primigravida, third trimester: Secondary | ICD-10-CM | POA: Insufficient documentation

## 2018-04-02 DIAGNOSIS — Z362 Encounter for other antenatal screening follow-up: Secondary | ICD-10-CM

## 2018-04-02 DIAGNOSIS — O0990 Supervision of high risk pregnancy, unspecified, unspecified trimester: Secondary | ICD-10-CM

## 2018-04-02 DIAGNOSIS — Z3A33 33 weeks gestation of pregnancy: Secondary | ICD-10-CM | POA: Diagnosis not present

## 2018-04-07 ENCOUNTER — Ambulatory Visit (INDEPENDENT_AMBULATORY_CARE_PROVIDER_SITE_OTHER): Payer: Medicaid Other | Admitting: Obstetrics & Gynecology

## 2018-04-07 ENCOUNTER — Encounter: Payer: Self-pay | Admitting: Obstetrics & Gynecology

## 2018-04-07 VITALS — BP 125/71 | HR 86 | Wt 188.6 lb

## 2018-04-07 DIAGNOSIS — K5090 Crohn's disease, unspecified, without complications: Secondary | ICD-10-CM

## 2018-04-07 DIAGNOSIS — O0990 Supervision of high risk pregnancy, unspecified, unspecified trimester: Secondary | ICD-10-CM

## 2018-04-07 DIAGNOSIS — O99013 Anemia complicating pregnancy, third trimester: Secondary | ICD-10-CM

## 2018-04-07 DIAGNOSIS — O1203 Gestational edema, third trimester: Secondary | ICD-10-CM

## 2018-04-07 DIAGNOSIS — E876 Hypokalemia: Secondary | ICD-10-CM

## 2018-04-07 DIAGNOSIS — O0993 Supervision of high risk pregnancy, unspecified, third trimester: Secondary | ICD-10-CM

## 2018-04-07 DIAGNOSIS — O99613 Diseases of the digestive system complicating pregnancy, third trimester: Secondary | ICD-10-CM

## 2018-04-07 DIAGNOSIS — Z3480 Encounter for supervision of other normal pregnancy, unspecified trimester: Secondary | ICD-10-CM

## 2018-04-07 NOTE — Progress Notes (Signed)
PRENATAL VISIT NOTE  Subjective:  Diane [name] is a 37 y.o. G3P2002 at 35w4dbeing seen today for ongoing prenatal care.  She is currently monitored for the following issues for this high-risk pregnancy and has GASTROESOPHAGEAL REFLUX DISEASE, MILD; CROHN'S DSt. [place] Maternal Crohn's disease affecting pregnancy in third trimester (HPre[place]; Hypokalemia; Vertigo; Headache; History of endometriosis; Intractable migraine without aura; Leiomyoma; Low back pain radiating to left leg; Supervision of high risk pregnancy, antepartum; AMA (advanced maternal age) multigravida 35+; H/O myomectomy; Medication exposure during first trimester of pregnancy; and Anemia affecting pregnancy in third trimester on their problem list.  Patient reports continued BLE edema, had negative dopplers last visit. Wants to start her medical leave today.  Contractions: Irritability. Vag. Bleeding: None.  Movement: Present. Denies leaking of fluid.   The following portions of the patient's history were reviewed and updated as appropriate: allergies, current medications, past family history, past medical history, past social history, past surgical history and problem list. Problem list updated.  Objective:   Vitals:   04/07/18 0955  BP: 125/71  Pulse: 86  Weight: 188 lb 9.6 oz (85.5 kg)    Fetal Status: Fetal Heart Rate (bpm): 161 Fundal Height: 34 cm Movement: Present     General:  Alert, oriented and cooperative. Patient is in no acute distress.  Skin: Skin is warm and dry. No rash noted.   Cardiovascular: Normal heart rate noted  Respiratory: Normal respiratory effort, no problems with respiration noted  Abdomen: Soft, gravid, appropriate for gestational age.  Pain/Pressure: Present     Pelvic: Cervical exam deferred        Extremities: Normal range of motion.  Edema: Mild pitting, slight indentation  Mental Status: Normal mood and affect. Normal behavior. Normal judgment and thought content.     CBC Latest Ref Rng & Units 03/24/2018 02/19/2018 01/21/2018  WBC 3.4 - 10.8 x10E3/uL 5.6 6.6 6.2  Hemoglobin 11.1 - 15.9 g/dL 9.0(L) 9.2(L) 9.3(L)  Hematocrit 34.0 - 46.6 % 26.8(L) 27.1(L) 27.2(L)  Platelets 150 - 450 x10E3/uL 205 188 221   BMP Latest Ref Rng & Units 03/24/2018 01/21/2018 10/25/2017  Glucose 65 - 99 mg/dL 75 76 93  BUN 6 - 20 mg/dL 7 9 14   Creatinine 0.57 - 1.00 mg/dL 0.55(L) 0.46(L) 0.49  BUN/Creat Ratio 9 - 23 13 20  -  Sodium 134 - 144 mmol/L 139 139 135  Potassium 3.5 - 5.2 mmol/L 3.1(L) 3.0(L) 3.1(L)  Chloride 96 - 106 mmol/L 107(H) 104 105  CO2 20 - 29 mmol/L 20 22 23   Calcium 8.7 - 10.2 mg/dL 7.9(L) 7.9(L) 8.3(L)   03/24/2018 BLE dopplers Final Interpretation: Right: There is no evidence of deep vein thrombosis in the lower extremity.There is no evidence of superficial venous thrombosis. Left: There is no evidence of deep vein thrombosis in the lower extremity.There is no evidence of superficial venous thrombosis.  Assessment and Plan:  Pregnancy: G3P2002 at 314w4d1. Lower extremity edema during pregnancy in third trimester Negative BLE dopplers. Still very symptomatic edema, unable to walk well or well shoes. Wants to stop working. Letter given to patient, she will start her FMLA leave today.  2. Hypokalemia K 3.1, eating potassium rich foods. Prescribed KDur but this makes her nauseated.  3. Anemia affecting pregnancy in third trimester Has received one dose of Feraheme, next dose tomorrow. Continue oral iron therapy.  4. Maternal Crohn's disease affecting pregnancy in third trimester (HCBethelStable.  5. Supervision of other normal pregnancy, antepartum  Preterm labor symptoms and general obstetric precautions including but not limited to vaginal bleeding, contractions, leaking of fluid and fetal movement were reviewed in detail with the patient. Please refer to After Visit Summary for other counseling recommendations.  Return in about 1 week (around 04/14/2018)  for OB Visit [facility]), Pelvic cultures.  Future Appointments  Date Time Provider [place]  04/08/2018 12:00 PM [facility]-MD[facility] ROOM 8 [facility] None    Verita Schneiders, MD

## 2018-04-07 NOTE — Patient Instructions (Signed)
Return to clinic for any scheduled appointments or obstetric concerns, or go to MAU for evaluation  Hypokalemia Hypokalemia means that the amount of potassium in the blood is lower than normal.Potassium is a chemical that helps regulate the amount of fluid in the body (electrolyte). It also stimulates muscle tightening (contraction) and helps nerves work properly.Normally, most of the body's potassium is inside of cells, and only a very small amount is in the blood. Because the amount in the blood is so small, minor changes to potassium levels in the blood can be life-threatening. What are the causes? This condition may be caused by:  Antibiotic medicine.  Diarrhea or vomiting. Taking too much of a medicine that helps you have a bowel movement (laxative) can cause diarrhea and lead to hypokalemia.  Chronic kidney disease (CKD).  Medicines that help the body get rid of excess fluid (diuretics).  Eating disorders, such as bulimia.  Low magnesium levels in the body.  Sweating a lot.  What are the signs or symptoms? Symptoms of this condition include:  Weakness.  Constipation.  Fatigue.  Muscle cramps.  Mental confusion.  Skipped heartbeats or irregular heartbeat (palpitations).  Tingling or numbness.  How is this diagnosed? This condition is diagnosed with a blood test. How is this treated? Hypokalemia can be treated by taking potassium supplements by mouth or adjusting the medicines that you take. Treatment may also include eating more foods that contain a lot of potassium. If your potassium level is very low, you may need to get potassium through an IV tube in one of your veins and be monitored in the hospital. Follow these instructions at home:  Take over-the-counter and prescription medicines only as told by your health care provider. This includes vitamins and supplements.  Eat a healthy diet. A healthy diet includes fresh fruits and vegetables, whole grains, healthy  fats, and lean proteins.  If instructed, eat more foods that contain a lot of potassium, such as: ? Nuts, such as peanuts and pistachios. ? Seeds, such as sunflower seeds and pumpkin seeds. ? Peas, lentils, and lima beans. ? Whole grain and bran cereals and breads. ? Fresh fruits and vegetables, such as apricots, avocado, bananas, cantaloupe, kiwi, oranges, tomatoes, asparagus, and potatoes. ? Orange juice. ? Tomato juice. ? Red meats. ? Yogurt.  Keep all follow-up visits as told by your health care provider. This is important. Contact a health care provider if:  You have weakness that gets worse.  You feel your heart pounding or racing.  You vomit.  You have diarrhea.  You have diabetes (diabetes mellitus) and you have trouble keeping your blood sugar (glucose) in your target range. Get help right away if:  You have chest pain.  You have shortness of breath.  You have vomiting or diarrhea that lasts for more than 2 days.  You faint. This information is not intended to replace advice given to you by your health care provider. Make sure you discuss any questions you have with your health care provider. Document Released: 09/10/2005 Document Revised: 04/28/2016 Document Reviewed: 04/28/2016 Elsevier Interactive Patient Education  2018 [employer].   Pregnancy and Anemia Anemia is a condition in which the concentration of red blood cells or hemoglobin in the blood is below normal. Hemoglobin is a substance in red blood cells that carries oxygen to the tissues of the body. Anemia results in not enough oxygen reaching these tissues. Anemia during pregnancy is common because the fetus uses more iron and folic acid  as it is developing. Your body may not produce enough red blood cells because of this. Also, during pregnancy, the liquid part of the blood (plasma) increases by about 50%, and the red blood cells increase by only 25%. This lowers the concentration of the red blood  cells and creates a natural anemia-like situation. What are the causes? The most common cause of anemia during pregnancy is not having enough iron in the body to make red blood cells (iron deficiency anemia). Other causes may include:  Folic acid deficiency.  Vitamin B12 deficiency.  Certain prescription or over-the-counter medicines.  Certain medical conditions or infections that destroy red blood cells.  A low platelet count and bleeding caused by antibodies that go through the placenta to the fetus from the mother's blood.  What are the signs or symptoms? Mild anemia may not be noticeable. If it becomes severe, symptoms may include:  Tiredness.  Shortness of breath, especially with exercise.  Weakness.  Fainting.  Pale looking skin.  Headaches.  Feeling a fast or irregular heartbeat (palpitations).  How is this diagnosed? The type of anemia is usually diagnosed from your family and medical history and blood tests. How is this treated? Treatment of anemia during pregnancy depends on the cause of the anemia. Treatment can include:  Supplements of iron, vitamin L29, or folic acid.  A blood transfusion. This may be needed if blood loss is severe.  Hospitalization. This may be needed if there is significant continual blood loss.  Dietary changes.  Follow these instructions at home:  Follow your dietitian's or health care provider's dietary recommendations.  Increase your vitamin C intake. This will help the stomach absorb more iron.  Eat a diet rich in iron. This would include foods such as: ? Liver. ? Beef. ? Whole grain bread. ? Eggs. ? Dried fruit.  Take iron and vitamins as directed by your health care provider.  Eat green leafy vegetables. These are a good source of folic acid. Contact a health care provider if:  You have frequent or lasting headaches.  You are looking pale.  You are bruising easily. Get help right away if:  You have extreme  weakness, shortness of breath, or chest pain.  You become dizzy or have trouble concentrating.  You have heavy vaginal bleeding.  You develop a rash.  You have bloody or black, tarry stools.  You faint.  You vomit up blood.  You vomit repeatedly.  You have abdominal pain.  You have a fever or persistent symptoms for more than 2-3 days.  You have a fever and your symptoms suddenly get worse.  You are dehydrated. This information is not intended to replace advice given to you by your health care provider. Make sure you discuss any questions you have with your health care provider. Document Released: 09/07/2000 Document Revised: 02/16/2016 Document Reviewed: 04/22/2013 Elsevier Interactive Patient Education  2017 [employer].

## 2018-04-08 ENCOUNTER — Ambulatory Visit (HOSPITAL_COMMUNITY)
Admission: RE | Admit: 2018-04-08 | Discharge: 2018-04-08 | Disposition: A | Discharge status: Home / Self Care | Payer: Medicaid Other | Source: Ambulatory Visit | Attending: Obstetrics & Gynecology | Admitting: Obstetrics & Gynecology

## 2018-04-08 DIAGNOSIS — O99013 Anemia complicating pregnancy, third trimester: Secondary | ICD-10-CM | POA: Diagnosis not present

## 2018-04-08 DIAGNOSIS — Z3A00 Weeks of gestation of pregnancy not specified: Secondary | ICD-10-CM | POA: Diagnosis not present

## 2018-04-08 MED ORDER — SODIUM CHLORIDE 0.9 % IV SOLN
510.0000 mg | INTRAVENOUS | Status: DC
  Administered 2018-04-08: 510 mg via INTRAVENOUS
  Filled 2018-04-08: qty 17

## 2018-04-09 ENCOUNTER — Telehealth (HOSPITAL_COMMUNITY): Payer: Self-pay | Admitting: *Deleted

## 2018-04-09 NOTE — Telephone Encounter (Signed)
Preadmission screen  

## 2018-04-10 ENCOUNTER — Encounter (HOSPITAL_COMMUNITY): Payer: Self-pay

## 2018-04-14 ENCOUNTER — Encounter (HOSPITAL_COMMUNITY)
Admission: AD | Disposition: A | Discharge status: Home / Self Care | Payer: Self-pay | Source: Home / Self Care | Attending: Obstetrics and Gynecology

## 2018-04-14 ENCOUNTER — Other Ambulatory Visit: Payer: Self-pay

## 2018-04-14 ENCOUNTER — Inpatient Hospital Stay (HOSPITAL_COMMUNITY)
Admission: AD | Admit: 2018-04-14 | Discharge: 2018-04-17 | DRG: 786 | Disposition: A | Discharge status: Home / Self Care | Payer: Medicaid Other | Attending: Obstetrics and Gynecology | Admitting: Obstetrics and Gynecology

## 2018-04-14 ENCOUNTER — Encounter (HOSPITAL_COMMUNITY): Payer: Self-pay

## 2018-04-14 ENCOUNTER — Inpatient Hospital Stay (HOSPITAL_COMMUNITY): Payer: Medicaid Other | Admitting: Anesthesiology

## 2018-04-14 DIAGNOSIS — O3429 Maternal care due to uterine scar from other previous surgery: Secondary | ICD-10-CM | POA: Diagnosis present

## 2018-04-14 DIAGNOSIS — Z98891 History of uterine scar from previous surgery: Secondary | ICD-10-CM

## 2018-04-14 DIAGNOSIS — K5090 Crohn's disease, unspecified, without complications: Secondary | ICD-10-CM | POA: Diagnosis present

## 2018-04-14 DIAGNOSIS — Z3A35 35 weeks gestation of pregnancy: Secondary | ICD-10-CM | POA: Diagnosis not present

## 2018-04-14 DIAGNOSIS — O09891 Supervision of other high risk pregnancies, first trimester: Secondary | ICD-10-CM

## 2018-04-14 DIAGNOSIS — O9962 Diseases of the digestive system complicating childbirth: Secondary | ICD-10-CM | POA: Diagnosis present

## 2018-04-14 DIAGNOSIS — O0990 Supervision of high risk pregnancy, unspecified, unspecified trimester: Secondary | ICD-10-CM

## 2018-04-14 DIAGNOSIS — O42913 Preterm premature rupture of membranes, unspecified as to length of time between rupture and onset of labor, third trimester: Principal | ICD-10-CM | POA: Diagnosis present

## 2018-04-14 DIAGNOSIS — O9081 Anemia of the puerperium: Secondary | ICD-10-CM | POA: Diagnosis present

## 2018-04-14 DIAGNOSIS — O42013 Preterm premature rupture of membranes, onset of labor within 24 hours of rupture, third trimester: Secondary | ICD-10-CM | POA: Diagnosis not present

## 2018-04-14 HISTORY — DX: Benign neoplasm of connective and other soft tissue, unspecified: D21.9

## 2018-04-14 LAB — TYPE AND SCREEN
ABO/RH(D): A POS
Antibody Screen: NEGATIVE

## 2018-04-14 LAB — COMPREHENSIVE METABOLIC PANEL
ALT: 21 U/L (ref 0–44)
AST: 27 U/L (ref 15–41)
Albumin: 2.8 g/dL — ABNORMAL LOW (ref 3.5–5.0)
Alkaline Phosphatase: 63 U/L (ref 38–126)
Anion gap: 10 (ref 5–15)
BUN: 7 mg/dL (ref 6–20)
CO2: 21 mmol/L — ABNORMAL LOW (ref 22–32)
Calcium: 7.8 mg/dL — ABNORMAL LOW (ref 8.9–10.3)
Chloride: 103 mmol/L (ref 98–111)
Creatinine, Ser: 0.45 mg/dL (ref 0.44–1.00)
GFR calc Af Amer: >60 mL/min (ref >60)
GFR calc non Af Amer: >60 mL/min (ref >60)
Glucose, Bld: 92 mg/dL (ref 70–99)
Potassium: 3.1 mmol/L — ABNORMAL LOW (ref 3.5–5.1)
Sodium: 134 mmol/L — ABNORMAL LOW (ref 135–145)
Total Bilirubin: 0.5 mg/dL (ref 0.3–1.2)
Total Protein: 6.1 g/dL — ABNORMAL LOW (ref 6.5–8.1)

## 2018-04-14 LAB — CBC
HCT: 30 % — ABNORMAL LOW (ref 36.0–46.0)
HCT: 30.1 % — ABNORMAL LOW (ref 36.0–46.0)
Hemoglobin: 10.1 g/dL — ABNORMAL LOW (ref 12.0–15.0)
Hemoglobin: 10.3 g/dL — ABNORMAL LOW (ref 12.0–15.0)
MCH: 30.1 pg (ref 26.0–34.0)
MCH: 30.2 pg (ref 26.0–34.0)
MCHC: 33.7 g/dL (ref 30.0–36.0)
MCHC: 34.2 g/dL (ref 30.0–36.0)
MCV: 89.6 fL (ref 78.0–100.0)
MCV: 88.3 fL (ref 78.0–100.0)
Platelets: 172 10*3/uL (ref 150–400)
Platelets: 162 10*3/uL (ref 150–400)
RBC: 3.35 MIL/uL — ABNORMAL LOW (ref 3.87–5.11)
RBC: 3.41 MIL/uL — ABNORMAL LOW (ref 3.87–5.11)
RDW: 16.2 % — ABNORMAL HIGH (ref 11.5–15.5)
RDW: 16.1 % — ABNORMAL HIGH (ref 11.5–15.5)
WBC: 6.7 10*3/uL (ref 4.0–10.5)
WBC: 11.3 10*3/uL — ABNORMAL HIGH (ref 4.0–10.5)

## 2018-04-14 LAB — RPR: RPR Ser Ql: NONREACTIVE

## 2018-04-14 SURGERY — Surgical Case
Anesthesia: Spinal

## 2018-04-14 MED ORDER — SCOPOLAMINE 1 MG/3DAYS TD PT72
MEDICATED_PATCH | TRANSDERMAL | Status: AC
  Filled 2018-04-14: qty 1

## 2018-04-14 MED ORDER — MEPERIDINE HCL 25 MG/ML IJ SOLN: 6.2500 mg | INTRAMUSCULAR | Status: DC | PRN

## 2018-04-14 MED ORDER — OXYCODONE HCL 5 MG PO TABS
10.0000 mg | ORAL_TABLET | ORAL | Status: DC | PRN
  Administered 2018-04-15: 10 mg via ORAL
  Filled 2018-04-14: qty 2

## 2018-04-14 MED ORDER — BUPIVACAINE IN DEXTROSE 0.75-8.25 % IT SOLN
INTRATHECAL | Status: DC | PRN
  Administered 2018-04-14: 1.8 mL via INTRATHECAL

## 2018-04-14 MED ORDER — KETOROLAC TROMETHAMINE 30 MG/ML IJ SOLN
INTRAMUSCULAR | Status: AC
  Filled 2018-04-14: qty 1

## 2018-04-14 MED ORDER — MEPERIDINE HCL 25 MG/ML IJ SOLN
INTRAMUSCULAR | Status: DC | PRN
  Administered 2018-04-14 (×2): 12.5 mg via INTRAVENOUS

## 2018-04-14 MED ORDER — NALOXONE HCL 4 MG/10ML IJ SOLN: 1.0000 ug/kg/h | INTRAVENOUS | Status: DC | PRN

## 2018-04-14 MED ORDER — SENNOSIDES-DOCUSATE SODIUM 8.6-50 MG PO TABS
2.0000 | ORAL_TABLET | Freq: Every evening | ORAL | Status: DC | PRN
  Administered 2018-04-15 – 2018-04-16 (×2): 2 via ORAL
  Filled 2018-04-14 (×2): qty 2

## 2018-04-14 MED ORDER — PHENYLEPHRINE HCL 10 MG/ML IJ SOLN
INTRAMUSCULAR | Status: DC | PRN
  Administered 2018-04-14 (×2): 80 ug via INTRAVENOUS

## 2018-04-14 MED ORDER — BUPIVACAINE HCL (PF) 0.5 % IJ SOLN
INTRAMUSCULAR | Status: AC
  Filled 2018-04-14: qty 30

## 2018-04-14 MED ORDER — TETANUS-DIPHTH-ACELL PERTUSSIS 5-2.5-18.5 LF-MCG/0.5 IM SUSP: 0.5000 mL | Freq: Once | INTRAMUSCULAR | Status: DC

## 2018-04-14 MED ORDER — CEFAZOLIN SODIUM-DEXTROSE 2-4 GM/100ML-% IV SOLN
2.0000 g | Freq: Once | INTRAVENOUS | Status: AC
  Administered 2018-04-14: 2 g via INTRAVENOUS
  Filled 2018-04-14: qty 100

## 2018-04-14 MED ORDER — IBUPROFEN 600 MG PO TABS
600.0000 mg | ORAL_TABLET | Freq: Four times a day (QID) | ORAL | Status: DC | PRN
  Administered 2018-04-14 – 2018-04-16 (×6): 600 mg via ORAL
  Filled 2018-04-14 (×6): qty 1

## 2018-04-14 MED ORDER — DIPHENHYDRAMINE HCL 50 MG/ML IJ SOLN
12.5000 mg | INTRAMUSCULAR | Status: DC | PRN
  Administered 2018-04-14: 12.5 mg via INTRAVENOUS
  Filled 2018-04-14: qty 1

## 2018-04-14 MED ORDER — SENNOSIDES-DOCUSATE SODIUM 8.6-50 MG PO TABS: 2.0000 | ORAL_TABLET | ORAL | Status: DC

## 2018-04-14 MED ORDER — DIPHENHYDRAMINE HCL 25 MG PO CAPS
25.0000 mg | ORAL_CAPSULE | Freq: Four times a day (QID) | ORAL | Status: DC | PRN
  Administered 2018-04-14: 25 mg via ORAL
  Filled 2018-04-14: qty 1

## 2018-04-14 MED ORDER — OXYTOCIN 40 UNITS IN LACTATED RINGERS INFUSION - SIMPLE MED: 2.5000 [IU]/h | INTRAVENOUS | Status: AC

## 2018-04-14 MED ORDER — ZOLPIDEM TARTRATE 5 MG PO TABS: 5.0000 mg | ORAL_TABLET | Freq: Every evening | ORAL | Status: DC | PRN

## 2018-04-14 MED ORDER — NALBUPHINE HCL 10 MG/ML IJ SOLN: 5.0000 mg | Freq: Once | INTRAMUSCULAR | Status: DC | PRN

## 2018-04-14 MED ORDER — MEPERIDINE HCL 25 MG/ML IJ SOLN
INTRAMUSCULAR | Status: AC
  Filled 2018-04-14: qty 1

## 2018-04-14 MED ORDER — ONDANSETRON HCL 4 MG/2ML IJ SOLN
INTRAMUSCULAR | Status: DC | PRN
  Administered 2018-04-14: 4 mg via INTRAVENOUS

## 2018-04-14 MED ORDER — OXYTOCIN 10 UNIT/ML IJ SOLN
INTRAMUSCULAR | Status: AC
  Filled 2018-04-14: qty 4

## 2018-04-14 MED ORDER — MORPHINE SULFATE (PF) 0.5 MG/ML IJ SOLN
INTRAMUSCULAR | Status: AC
  Filled 2018-04-14: qty 10

## 2018-04-14 MED ORDER — COCONUT OIL OIL
1.0000 "application " | TOPICAL_OIL | Status: DC | PRN
  Administered 2018-04-15 (×2): 1 via TOPICAL
  Filled 2018-04-14 (×2): qty 120

## 2018-04-14 MED ORDER — OXYTOCIN 10 UNIT/ML IJ SOLN
INTRAVENOUS | Status: DC | PRN
  Administered 2018-04-14: 40 [IU] via INTRAVENOUS

## 2018-04-14 MED ORDER — KETOROLAC TROMETHAMINE 30 MG/ML IJ SOLN
30.0000 mg | Freq: Four times a day (QID) | INTRAMUSCULAR | Status: DC | PRN
  Administered 2018-04-15: 30 mg via INTRAVENOUS
  Filled 2018-04-14: qty 1

## 2018-04-14 MED ORDER — FAMOTIDINE IN NACL 20-0.9 MG/50ML-% IV SOLN
20.0000 mg | Freq: Once | INTRAVENOUS | Status: AC
  Administered 2018-04-14: 20 mg via INTRAVENOUS
  Filled 2018-04-14: qty 50

## 2018-04-14 MED ORDER — ONDANSETRON HCL 4 MG/2ML IJ SOLN
INTRAMUSCULAR | Status: AC
  Filled 2018-04-14: qty 2

## 2018-04-14 MED ORDER — IBUPROFEN 600 MG PO TABS: 600.0000 mg | ORAL_TABLET | Freq: Four times a day (QID) | ORAL | Status: DC

## 2018-04-14 MED ORDER — FENTANYL CITRATE (PF) 100 MCG/2ML IJ SOLN
25.0000 ug | INTRAMUSCULAR | Status: DC | PRN
  Administered 2018-04-14: 25 ug via INTRAVENOUS
  Administered 2018-04-14: 50 ug via INTRAVENOUS
  Administered 2018-04-14: 25 ug via INTRAVENOUS

## 2018-04-14 MED ORDER — SIMETHICONE 80 MG PO CHEW: 80.0000 mg | CHEWABLE_TABLET | ORAL | Status: DC

## 2018-04-14 MED ORDER — PHENYLEPHRINE 40 MCG/ML (10ML) SYRINGE FOR IV PUSH (FOR BLOOD PRESSURE SUPPORT)
PREFILLED_SYRINGE | INTRAVENOUS | Status: AC
  Filled 2018-04-14: qty 10

## 2018-04-14 MED ORDER — NALBUPHINE HCL 10 MG/ML IJ SOLN: 5.0000 mg | INTRAMUSCULAR | Status: DC | PRN

## 2018-04-14 MED ORDER — SIMETHICONE 80 MG PO CHEW: 80.0000 mg | CHEWABLE_TABLET | ORAL | Status: DC | PRN

## 2018-04-14 MED ORDER — SODIUM CHLORIDE 0.9 % IR SOLN
Status: DC | PRN
  Administered 2018-04-14 (×2): 1000 mL

## 2018-04-14 MED ORDER — DEXAMETHASONE SODIUM PHOSPHATE 10 MG/ML IJ SOLN
INTRAMUSCULAR | Status: DC | PRN
  Administered 2018-04-14: 10 mg via INTRAVENOUS

## 2018-04-14 MED ORDER — SODIUM CHLORIDE 0.9 % IR SOLN
Status: DC | PRN
  Administered 2018-04-14: 1000 mL

## 2018-04-14 MED ORDER — LORATADINE 10 MG PO TABS
10.0000 mg | ORAL_TABLET | Freq: Every day | ORAL | Status: DC
  Administered 2018-04-14 – 2018-04-17 (×4): 10 mg via ORAL
  Filled 2018-04-14 (×5): qty 1

## 2018-04-14 MED ORDER — SODIUM CHLORIDE 0.9% FLUSH
3.0000 mL | INTRAVENOUS | Status: DC | PRN
  Administered 2018-04-15: 3 mL via INTRAVENOUS
  Filled 2018-04-14: qty 3

## 2018-04-14 MED ORDER — PHENYLEPHRINE 8 MG IN D5W 100 ML (0.08MG/ML) PREMIX OPTIME
INJECTION | INTRAVENOUS | Status: DC | PRN
  Administered 2018-04-14: 15 ug/min via INTRAVENOUS

## 2018-04-14 MED ORDER — PRENATAL MULTIVITAMIN CH
1.0000 | ORAL_TABLET | Freq: Every day | ORAL | Status: DC
  Administered 2018-04-15 – 2018-04-17 (×3): 1 via ORAL
  Filled 2018-04-14 (×3): qty 1

## 2018-04-14 MED ORDER — MENTHOL 3 MG MT LOZG: 1.0000 | LOZENGE | OROMUCOSAL | Status: DC | PRN

## 2018-04-14 MED ORDER — MORPHINE SULFATE (PF) 0.5 MG/ML IJ SOLN
INTRAMUSCULAR | Status: DC | PRN
  Administered 2018-04-14: .2 mg via INTRATHECAL

## 2018-04-14 MED ORDER — BETAMETHASONE SOD PHOS & ACET 6 (3-3) MG/ML IJ SUSP
12.0000 mg | Freq: Once | INTRAMUSCULAR | Status: AC
  Administered 2018-04-14: 12 mg via INTRAMUSCULAR
  Filled 2018-04-14: qty 2

## 2018-04-14 MED ORDER — SOD CITRATE-CITRIC ACID 500-334 MG/5ML PO SOLN
30.0000 mL | Freq: Once | ORAL | Status: AC
  Administered 2018-04-14: 30 mL via ORAL
  Filled 2018-04-14: qty 15

## 2018-04-14 MED ORDER — DIPHENHYDRAMINE HCL 25 MG PO CAPS: 25.0000 mg | ORAL_CAPSULE | ORAL | Status: DC | PRN

## 2018-04-14 MED ORDER — ENOXAPARIN SODIUM 40 MG/0.4ML ~~LOC~~ SOLN
40.0000 mg | SUBCUTANEOUS | Status: DC
  Administered 2018-04-15 – 2018-04-17 (×3): 40 mg via SUBCUTANEOUS
  Filled 2018-04-14 (×3): qty 0.4

## 2018-04-14 MED ORDER — NALOXONE HCL 0.4 MG/ML IJ SOLN: 0.4000 mg | INTRAMUSCULAR | Status: DC | PRN

## 2018-04-14 MED ORDER — SCOPOLAMINE 1 MG/3DAYS TD PT72
1.0000 | MEDICATED_PATCH | Freq: Once | TRANSDERMAL | Status: DC
  Administered 2018-04-14: 1.5 mg via TRANSDERMAL
  Filled 2018-04-14: qty 1

## 2018-04-14 MED ORDER — ACETAMINOPHEN 500 MG PO TABS
1000.0000 mg | ORAL_TABLET | Freq: Once | ORAL | Status: AC
  Administered 2018-04-14: 1000 mg via ORAL
  Filled 2018-04-14: qty 2

## 2018-04-14 MED ORDER — LACTATED RINGERS IV SOLN
INTRAVENOUS | Status: DC | PRN
  Administered 2018-04-14: 07:00:00 via INTRAVENOUS

## 2018-04-14 MED ORDER — LACTATED RINGERS IV SOLN
INTRAVENOUS | Status: DC
  Administered 2018-04-14: 02:00:00 via INTRAVENOUS

## 2018-04-14 MED ORDER — SIMETHICONE 80 MG PO CHEW
80.0000 mg | CHEWABLE_TABLET | Freq: Three times a day (TID) | ORAL | Status: DC
  Administered 2018-04-14 – 2018-04-17 (×9): 80 mg via ORAL
  Filled 2018-04-14 (×9): qty 1

## 2018-04-14 MED ORDER — FENTANYL CITRATE (PF) 100 MCG/2ML IJ SOLN
INTRAMUSCULAR | Status: DC | PRN
  Administered 2018-04-14: 20 ug via INTRATHECAL

## 2018-04-14 MED ORDER — FENTANYL CITRATE (PF) 100 MCG/2ML IJ SOLN
INTRAMUSCULAR | Status: AC
  Filled 2018-04-14: qty 2

## 2018-04-14 MED ORDER — OXYCODONE HCL 5 MG PO TABS
5.0000 mg | ORAL_TABLET | ORAL | Status: DC | PRN
  Administered 2018-04-15 – 2018-04-16 (×3): 5 mg via ORAL
  Filled 2018-04-14 (×3): qty 1

## 2018-04-14 MED ORDER — AMPICILLIN SODIUM 2 G IJ SOLR
2.0000 g | Freq: Once | INTRAMUSCULAR | Status: AC
  Administered 2018-04-14: 2 g via INTRAVENOUS
  Filled 2018-04-14: qty 2

## 2018-04-14 MED ORDER — LACTATED RINGERS IV BOLUS
1000.0000 mL | Freq: Once | INTRAVENOUS | Status: AC
  Administered 2018-04-14: 1000 mL via INTRAVENOUS

## 2018-04-14 MED ORDER — KETOROLAC TROMETHAMINE 30 MG/ML IJ SOLN
30.0000 mg | Freq: Four times a day (QID) | INTRAMUSCULAR | Status: DC | PRN
  Administered 2018-04-14: 30 mg via INTRAMUSCULAR

## 2018-04-14 MED ORDER — LACTATED RINGERS IV SOLN
INTRAVENOUS | Status: DC
  Administered 2018-04-14 – 2018-04-15 (×3): via INTRAVENOUS

## 2018-04-14 MED ORDER — WITCH HAZEL-GLYCERIN EX PADS: 1.0000 "application " | MEDICATED_PAD | CUTANEOUS | Status: DC | PRN

## 2018-04-14 MED ORDER — ACETAMINOPHEN 325 MG PO TABS
650.0000 mg | ORAL_TABLET | ORAL | Status: DC | PRN
  Administered 2018-04-15: 650 mg via ORAL
  Filled 2018-04-14: qty 2

## 2018-04-14 MED ORDER — DEXAMETHASONE SODIUM PHOSPHATE 10 MG/ML IJ SOLN
INTRAMUSCULAR | Status: AC
  Filled 2018-04-14: qty 1

## 2018-04-14 MED ORDER — ONDANSETRON HCL 4 MG/2ML IJ SOLN
4.0000 mg | Freq: Three times a day (TID) | INTRAMUSCULAR | Status: DC | PRN
  Administered 2018-04-14: 4 mg via INTRAVENOUS
  Filled 2018-04-14: qty 2

## 2018-04-14 MED ORDER — DIPHENHYDRAMINE HCL 50 MG/ML IJ SOLN
12.5000 mg | Freq: Once | INTRAMUSCULAR | Status: AC
  Administered 2018-04-14: 12.5 mg via INTRAVENOUS
  Filled 2018-04-14: qty 1

## 2018-04-14 MED ORDER — DIBUCAINE 1 % RE OINT: 1.0000 "application " | TOPICAL_OINTMENT | RECTAL | Status: DC | PRN

## 2018-04-14 SURGICAL SUPPLY — 32 items
APL SKNCLS STERI-STRIP NONHPOA (GAUZE/BANDAGES/DRESSINGS) ×1
BENZOIN TINCTURE PRP APPL 2/3 (GAUZE/BANDAGES/DRESSINGS) ×1 IMPLANT
CHLORAPREP W/TINT 26ML (MISCELLANEOUS) ×2 IMPLANT
CLAMP CORD UMBIL (MISCELLANEOUS) IMPLANT
CLOTH BEACON ORANGE TIMEOUT ST (SAFETY) ×2 IMPLANT
DRSG OPSITE POSTOP 4X10 (GAUZE/BANDAGES/DRESSINGS) ×2 IMPLANT
ELECT REM PT RETURN 9FT ADLT (ELECTROSURGICAL) ×2
ELECTRODE REM PT RTRN 9FT ADLT (ELECTROSURGICAL) ×1 IMPLANT
EXTRACTOR VACUUM M CUP 4 TUBE (SUCTIONS) IMPLANT
GLOVE BIOGEL PI IND STRL 7.0 (GLOVE) ×3 IMPLANT
GLOVE BIOGEL PI INDICATOR 7.0 (GLOVE) ×3
GLOVE ECLIPSE 7.0 STRL STRAW (GLOVE) ×2 IMPLANT
GOWN STRL REUS W/TWL LRG LVL3 (GOWN DISPOSABLE) ×4 IMPLANT
KIT ABG SYR 3ML LUER SLIP (SYRINGE) IMPLANT
NDL HYPO 25X5/8 SAFETYGLIDE (NEEDLE) ×1 IMPLANT
NEEDLE HYPO 22GX1.5 SAFETY (NEEDLE) ×2 IMPLANT
NEEDLE HYPO 25X5/8 SAFETYGLIDE (NEEDLE) ×2 IMPLANT
NS IRRIG 1000ML POUR BTL (IV SOLUTION) ×2 IMPLANT
PACK C SECTION WH (CUSTOM PROCEDURE TRAY) ×2 IMPLANT
PAD ABD 7.5X8 STRL (GAUZE/BANDAGES/DRESSINGS) ×2 IMPLANT
PAD OB MATERNITY 4.3X12.25 (PERSONAL CARE ITEMS) ×2 IMPLANT
PENCIL SMOKE EVAC W/HOLSTER (ELECTROSURGICAL) ×2 IMPLANT
RTRCTR C-SECT PINK 25CM LRG (MISCELLANEOUS) IMPLANT
STRIP CLOSURE SKIN 1/2X4 (GAUZE/BANDAGES/DRESSINGS) ×1 IMPLANT
SUT PDS AB 0 CTX 36 PDP370T (SUTURE) IMPLANT
SUT PLAIN 2 0 XLH (SUTURE) IMPLANT
SUT VIC AB 0 CTX 36 (SUTURE) ×4
SUT VIC AB 0 CTX36XBRD ANBCTRL (SUTURE) ×2 IMPLANT
SUT VIC AB 4-0 KS 27 (SUTURE) ×2 IMPLANT
SYR CONTROL 10ML LL (SYRINGE) ×2 IMPLANT
TOWEL OR 17X24 6PK STRL BLUE (TOWEL DISPOSABLE) ×2 IMPLANT
TRAY FOLEY W/BAG SLVR 14FR LF (SET/KITS/TRAYS/PACK) ×2 IMPLANT

## 2018-04-14 NOTE — Anesthesia Postprocedure Evaluation (Signed)
Anesthesia Post Note  Patient: Diane [name]  Procedure(s) Performed: PRIMARY CESAREAN SECTION (N/A )     Patient location during evaluation: Mother Baby Anesthesia Type: Spinal Level of consciousness: awake and alert and oriented Pain management: satisfactory to patient Vital Signs Assessment: post-procedure vital signs reviewed and stable Respiratory status: spontaneous breathing and nonlabored ventilation Cardiovascular status: stable Postop Assessment: no headache, no backache, patient able to bend at knees, no signs of nausea or vomiting, adequate PO intake and able to ambulate Anesthetic complications: no    Last Vitals:  Vitals:   04/14/18 1014 04/14/18 1149  BP: 134/89 133/84  Pulse: 62 70  Resp: 18 18  Temp:  36.9 C  SpO2: 96% 95%    Last Pain:  Vitals:   04/14/18 1430  TempSrc:   PainSc: 3    Pain Goal: Patients Stated Pain Goal: 3 (04/14/18 1031)               Willa Rough

## 2018-04-14 NOTE — Op Note (Signed)
Please see the brief operative note for surgical details

## 2018-04-14 NOTE — Transfer of Care (Signed)
Immediate Anesthesia Transfer of Care Note  Patient: Diane [name]  Procedure(s) Performed: PRIMARY CESAREAN SECTION (N/A )  Patient Location: PACU  Anesthesia Type:Spinal  Level of Consciousness: awake, alert  and oriented  Airway & Oxygen Therapy: Patient Spontanous Breathing  Post-op Assessment: Report given to RN and Post -op Vital signs reviewed and stable  Post vital signs: Reviewed and stable  Last Vitals:  Vitals Value Taken Time  BP 125/73 04/14/2018  7:20 AM  Temp    Pulse 74 04/14/2018  7:23 AM  Resp 11 04/14/2018  7:23 AM  SpO2 98 % 04/14/2018  7:23 AM  Vitals shown include unvalidated device data.  Last Pain:  Vitals:   04/14/18 0154  TempSrc: Oral         Complications: No apparent anesthesia complications

## 2018-04-14 NOTE — MAU Note (Signed)
Pt here with c/o rupture of membranes at 0050. Has a hx of myomectomy in 2016 and is scheduled for a C/S at 37 weeks.

## 2018-04-14 NOTE — Lactation Note (Signed)
This note was copied from a baby's chart. Lactation Consultation Note  Patient Name: Diane [name] [id number] Date: 04/14/2018   Initial visit at 13 hours of life. Infant is an LPI who recently transferred from the NICU. Mom is a P3 who nursed her 2 other children and reports an abundant supply once her milk comes to volume.   RN has already set her up w/a DEBP. Mom was given information sheet & bassinet card on the late preterm infant. Mom is aware that LPIs tend to need supplement (EBM or formula) until close to their due date. Mom knows that infant needs to feed q3 hrs. Mom knows to pump q time infant receives formula.  Mom's questions answered.  Matthias Hughs [facility] 04/14/2018, 8:48 PM

## 2018-04-14 NOTE — Anesthesia Preprocedure Evaluation (Addendum)
Anesthesia Evaluation  Patient identified by MRN, date of birth, ID band Patient awake    Reviewed: Allergy & Precautions, NPO status , Patient's Chart, lab work & pertinent test results  History of Anesthesia Complications (+) PONV and history of anesthetic complications  Airway Mallampati: II  TM Distance: >3 FB Neck ROM: Full    Dental  (+) Poor Dentition   Pulmonary pneumonia, resolved,    Pulmonary exam normal breath sounds clear to auscultation       Cardiovascular negative cardio ROS Normal cardiovascular exam+ Valvular Problems/Murmurs  Rhythm:Regular Rate:Normal     Neuro/Psych  Headaches, PSYCHIATRIC DISORDERS Anxiety ADD   GI/Hepatic Neg liver ROS, GERD  Medicated and Controlled,Hx/o Crohn's disease   Endo/Other  negative endocrine ROS  Renal/GU negative Renal ROS  negative genitourinary   Musculoskeletal negative musculoskeletal ROS (+)   Abdominal   Peds  Hematology  (+) anemia ,   Anesthesia Other Findings   Reproductive/Obstetrics (+) Pregnancy Hx/o previous myomectomy                            Anesthesia Physical Anesthesia Plan  ASA: II and emergent  Anesthesia Plan: Spinal   Post-op Pain Management:    Induction:   PONV Risk Score and Plan: 4 or greater and Scopolamine patch - Pre-op, Dexamethasone, Ondansetron and Treatment may vary due to age or medical condition  Airway Management Planned: Natural Airway  Additional Equipment:   Intra-op Plan:   Post-operative Plan:   Informed Consent: I have reviewed the patients History and Physical, chart, labs and discussed the procedure including the risks, benefits and alternatives for the proposed anesthesia with the patient or authorized representative who has indicated his/her understanding and acceptance.   Dental advisory given  Plan Discussed with: CRNA and Surgeon  Anesthesia Plan Comments:         Anesthesia Quick Evaluation

## 2018-04-14 NOTE — Progress Notes (Signed)
Circulator called Dr. Royce Macadamia to ask  what time to bring the patient back to OR 1 . Circulator was given the time 6 AM. RN called MAU to inform them about the time.

## 2018-04-14 NOTE — MAU Provider Note (Addendum)
Diane [name] is a 37 y.o. female presenting for rupture of membranes, at [redacted]w[redacted]d She will need a cesarean due to myomectomy in the past. She has a history of prior myomectomy by Dr Diane [name] whose Op note is personally reveiewed and indicates a recommendation for cesarean deliveries. .  the patient has been counselled and is aware of the risks of bleeding, infection, injury to bowel or bladder, the standard risks of surgery. OB History    Gravida  3   Para  2   Term  2   Preterm      AB      Living  2     SAB      TAB      Ectopic      Multiple      Live Births  2          Past Medical History:  Diagnosis Date  . ADD (attention deficit disorder)   . Anemia   . Anxiety   . [place]   . Fibroid   . H/O blood clots    tested positive but not located on doppler   . Headache(784.0)    otc meds - last migraine 2009  . Heart murmur    as child, never had any problems  . History of endometriosis   . Inflammatory bowel disease   . Low blood potassium    history  . Migraine without aura, with intractable migraine, so stated, without mention of status migrainosus 06/19/2013  . Pneumonia    history back in 2006  . PONV (postoperative nausea and vomiting)   . Seasonal allergies   . Vaginal Pap smear, abnormal   . Vasovagal syncope    Past Surgical History:  Procedure Laterality Date  . ADENOIDECTOMY    . DILATATION & CURRETTAGE/HYSTEROSCOPY WITH RESECTOCOPE N/A 06/09/2013   Procedure: DILATATION & CURETTAGE/HYSTEROSCOPY WITH HYSTEROSCOPIC RESECTION OF SUBMUCOSAL FIBROID AND ENDOMETRIAL POLYP;  Surgeon: Diane Staff MD;  [facility];  Service: Gynecology;  Laterality: N/A;  . ENDOMETRIAL ABLATION    . LAPAROSCOPY N/A 06/09/2013   Procedure: LAPAROSCOPY DIAGNOSTIC;  Surgeon: Diane Staff MD;  [facility];  Service: Gynecology;  Laterality: N/A;  . ROBOT ASSISTED MYOMECTOMY N/A 02/09/2015   Procedure: MYOMECTOMY,EXCISION OF  ENDOMETRIOSIS,PRE-SACRAL NEURECTOMY;  Surgeon: TGovernor Specking MD;  [facility];  Service: Gynecology;  Laterality: N/A;  . ROBOTIC ASSISTED LAPAROSCOPIC LYSIS OF ADHESION N/A 06/09/2013   Procedure: ROBOTIC ASSISTED LAPAROSCOPIC LYSIS OF ADHESION; Resection of Endometriosis and excision of fibroid;  Surgeon: Diane Staff MD;  [facility];  Service: Gynecology;  Laterality: N/A;  . TONSILLECTOMY    . TYMPANOSTOMY TUBE PLACEMENT     Family History: family history includes Asthma in her brother and mother; Crohn's disease in her mother; Diabetes in her maternal aunt; Heart disease in her maternal aunt and maternal grandfather; Heart disease (age of onset: 450 in her mother; Stroke in her maternal aunt and maternal grandfather. Social History:  reports that she has never smoked. She has never used smokeless tobacco. She reports that she does not drink alcohol or use drugs.     Maternal Diabetes: No Genetic Screening: Normal Maternal Ultrasounds/Referrals: Normal Fetal Ultrasounds or other Referrals:  None Maternal Substance Abuse:  No Significant Maternal Medications:  None Significant Maternal Lab Results:  None Other Comments:  None  ROS History   Blood pressure 140/85, pulse 96, temperature 98.2 F (36.8 C), temperature source Oral, resp. rate  18, last menstrual period 05/15/2017, SpO2 99 %. Maternal Exam:  Introitus: Normal vulva. Vagina is positive for vaginal discharge.  Ferning test: positive.  Nitrazine test: not done.  Pelvis: of concern for delivery.   Cervix: Cervix evaluated by sterile speculum exam.    vaginal exam my Diane [name] CNM Physical Exam  Constitutional: She appears well-developed and well-nourished. No distress.  HENT:  Head: Normocephalic and atraumatic.  Eyes: Pupils are equal, round, and reactive to light. EOM are normal.  Neck: Normal range of motion.  Cardiovascular: Normal rate.  Respiratory: Effort normal.  GI: Soft.   Gravid uterus, normal size, midline upper abdomen scars from robotic surgery x 2. Myomectomy , and pelvic presacral neurectomy.  Genitourinary: Vaginal discharge found.    Prenatal labs: ABO, Rh: --/--/A POS (07/22 0215) Antibody: PENDING (07/22 0215) Rubella: Immune (01/07 0000) RPR: Non Reactive (05/29 0847)  HBsAg: Negative (01/07 0000)  HIV: Non Reactive (05/29 0847)  GBS:    Unknown, given ampicillin 2 g upon dx of SROM  Assessment/Plan: Pregnancy 35w4 PPROM at 1 am History of myomectomy robotic, with recommended primary cesarean by surgeon Kerin [name]. Last food 10 pm  Plan: primary cesarean at 6 am.   Diane [name] 04/14/2018, 3:11 AM

## 2018-04-14 NOTE — H&P (Signed)
Jonnie Kind, MD  Physician  Obstetrics/Gynecology  MAU Provider Note  Addendum  Date of Service:  04/14/2018 3:11 AM                Show:Clear all [x] Manual[x] Template[] Copied  Added by: [x] Jonnie Kind, MD   [] Hover for details   Diane [name] is a 37 y.o. female presenting for rupture of membranes, at [redacted]w[redacted]d She will need a cesarean due to myomectomy in the past. She has a history of prior myomectomy by Dr YKerin Perna whose Op note is personally reveiewed and indicates a recommendation for cesarean deliveries. .  the patient has been counselled and is aware of the risks of bleeding, infection, injury to bowel or bladder, the standard risks of surgery.         OB History    Gravida  3   Para  2   Term  2   Preterm      AB      Living  2     SAB      TAB      Ectopic      Multiple      Live Births  2              Past Medical History:  Diagnosis Date  . ADD (attention deficit disorder)   . Anemia   . Anxiety   . [place]'s disease [place]   . Fibroid   . H/O blood clots    tested positive but not located on doppler   . Headache(784.0)    otc meds - last migraine 2009  . Heart murmur    as child, never had any problems  . History of endometriosis   . Inflammatory bowel disease   . Low blood potassium    history  . Migraine without aura, with intractable migraine, so stated, without mention of status migrainosus 06/19/2013  . Pneumonia    history back in 2006  . PONV (postoperative nausea and vomiting)   . Seasonal allergies   . Vaginal Pap smear, abnormal   . Vasovagal syncope         Past Surgical History:  Procedure Laterality Date  . ADENOIDECTOMY    . DILATATION & CURRETTAGE/HYSTEROSCOPY WITH RESECTOCOPE N/A 06/09/2013   Procedure: DILATATION & CURETTAGE/HYSTEROSCOPY WITH HYSTEROSCOPIC RESECTION OF SUBMUCOSAL FIBROID AND ENDOMETRIAL POLYP;  Surgeon: SMarvene Staff MD;  [facility];  Service: Gynecology;  Laterality: N/A;  . ENDOMETRIAL ABLATION    . LAPAROSCOPY N/A 06/09/2013   Procedure: LAPAROSCOPY DIAGNOSTIC;  Surgeon: SMarvene Staff MD;  [facility];  Service: Gynecology;  Laterality: N/A;  . ROBOT ASSISTED MYOMECTOMY N/A 02/09/2015   Procedure: MYOMECTOMY,EXCISION OF ENDOMETRIOSIS,PRE-SACRAL NEURECTOMY;  Surgeon: TGovernor Specking MD;  [facility];  Service: Gynecology;  Laterality: N/A;  . ROBOTIC ASSISTED LAPAROSCOPIC LYSIS OF ADHESION N/A 06/09/2013   Procedure: ROBOTIC ASSISTED LAPAROSCOPIC LYSIS OF ADHESION; Resection of Endometriosis and excision of fibroid;  Surgeon: SMarvene Staff MD;  [facility];  Service: Gynecology;  Laterality: N/A;  . TONSILLECTOMY    . TYMPANOSTOMY TUBE PLACEMENT     Family History: family history includes Asthma in her brother and mother; Crohn's disease in her mother; Diabetes in her maternal aunt; Heart disease in her maternal aunt and maternal grandfather; Heart disease (age of onset: 459 in her mother; Stroke in her maternal aunt and maternal grandfather. Social History:  reports that she has never smoked. She has never used smokeless tobacco.  She reports that she does not drink alcohol or use drugs.     Maternal Diabetes: No Genetic Screening: Normal Maternal Ultrasounds/Referrals: Normal Fetal Ultrasounds or other Referrals:  None Maternal Substance Abuse:  No Significant Maternal Medications:  None Significant Maternal Lab Results:  anemic, received iv iron with good response Other Comments:  None  ROS History Blood pressure 140/85, pulse 96, temperature 98.2 F (36.8 C), temperature source Oral, resp. rate 18, last menstrual period 05/15/2017, SpO2 99 %. Maternal Exam:  Introitus: Normal vulva. Vagina is positive for vaginal discharge.  Ferning test: positive.  Nitrazine test: not done.  Pelvis: of concern for delivery.   Cervix: Cervix evaluated by sterile speculum  exam.    vaginal exam my [name] CNM Physical Exam  Constitutional: She appears well-developed and well-nourished. No distress.  HENT:  Head: Normocephalic and atraumatic.  Eyes: Pupils are equal, round, and reactive to light. EOM are normal.  Neck: Normal range of motion.  Cardiovascular: Normal rate.  Respiratory: Effort normal.  GI: Soft.  Gravid uterus, normal size, midline upper abdomen scars from robotic surgery x 2. Myomectomy , and pelvic presacral neurectomy.  Genitourinary: Vaginal discharge found.    Prenatal labs: ABO, Rh: --/--/A POS (07/22 0215) Antibody: PENDING (07/22 0215) Rubella: Immune (01/07 0000) RPR: Non Reactive (05/29 0847)  HBsAg: Negative (01/07 0000)  HIV: Non Reactive (05/29 0847)  GBS:    Unknown, given ampicillin 2 g upon dx of SROM  CBC    Component Value Date/Time   WBC 6.7 04/14/2018 0215   RBC 3.35 (L) 04/14/2018 0215   HGB 10.1 (L) 04/14/2018 0215   HGB 9.0 (L) 03/24/2018 1004   HCT 30.0 (L) 04/14/2018 0215   HCT 26.8 (L) 03/24/2018 1004   PLT 172 04/14/2018 0215   PLT 205 03/24/2018 1004   MCV 89.6 04/14/2018 0215   MCV 86 03/24/2018 1004   MCH 30.1 04/14/2018 0215   MCHC 33.7 04/14/2018 0215   RDW 16.2 (H) 04/14/2018 0215   RDW 14.2 03/24/2018 1004   LYMPHSABS 1.9 10/25/2017 2230   MONOABS 0.2 10/25/2017 2230   EOSABS 0.1 10/25/2017 2230   BASOSABS 0.0 10/25/2017 2230    Assessment/Plan: Pregnancy 35w4 PPROM at 1 am History of myomectomy robotic, with recommended primary cesarean by surgeon Kerin Perna. Last food 10 pm  Plan: primary cesarean at 6 am.          Jonnie Kind 04/14/2018, 3:11 AM

## 2018-04-14 NOTE — Anesthesia Procedure Notes (Signed)
Spinal  Patient location during procedure: OR Start time: 04/14/2018 6:09 AM Staffing Anesthesiologist: Josephine Igo, MD Performed: anesthesiologist  Preanesthetic Checklist Completed: patient identified, site marked, surgical consent, pre-op evaluation, timeout performed, IV checked, risks and benefits discussed and monitors and equipment checked Spinal Block Patient position: sitting Prep: site prepped and draped and DuraPrep Patient monitoring: heart rate, cardiac monitor, continuous pulse ox and blood pressure Approach: midline Location: L3-4 Injection technique: single-shot Needle Needle type: Pencan  Needle gauge: 24 G Needle length: 9 cm Needle insertion depth: 6 cm Assessment Sensory level: T4 Events: paresthesia Additional Notes Patient tolerated procedure well. Adequate sensory level. Transient paresthesia left leg.

## 2018-04-14 NOTE — Op Note (Signed)
04/14/2018  7:14 AM  PATIENT:  Diane [name]  37 y.o. female  PRE-OPERATIVE DIAGNOSIS:  pregnancy 35 and 4 weeks, Primary Cesarean section, premature preterm rupture membranes History of Myomectomy  POST-OPERATIVE DIAGNOSIS:  Primary Cesarean section  History of Myomectomy premature preterm rupture membranes pregnancy 35 weeks 4 days  PROCEDURE:  Procedure(s): PRIMARY CESAREAN SECTION (N/A) low transverse uterine incision 2 layer uterine closure findings: SURGEON:  Surgeon(s) and Role:    * Jonnie Kind, MD - Primary  PHYSICIAN ASSISTANT:   ASSISTANTS: Micah Noel, CST  ANESTHESIA:   spinal  EBL:  1360 mL, measured, increased blood loss due to extensive varicosities in the left side of the lower uterine segment, and a very thick myometrium with large veins  BLOOD ADMINISTERED:none  DRAINS: Urinary Catheter (Foley)   LOCAL MEDICATIONS USED:  NONE  SPECIMEN:  Source of Specimen:  Placenta to labor and delivery  DISPOSITION OF SPECIMEN:  N/A  COUNTS:  YES  TOURNIQUET:  * No tourniquets in log *  DICTATION: .Dragon Dictation  PLAN OF CARE: Admit to inpatient   PATIENT DISPOSITION:  PACU - hemodynamically stable.   Delay start of Pharmacological VTE agent (>24hrs) due to surgical blood loss or risk of bleeding: not applicable Findings: Large thick myometrium with no defects.  Vertex presentation fetus, posterior placenta smooth myometrial surface, no intra-abdominal adhesions, if laparoscopy were to be necessary in the future there should be no anticipated difficulties Details of procedure: Patient was taken the operating room at 6 AM at 8 hours n.p.o.,, 4 hours after having received betamethasone dose due to preterm status.  Transverse lower abdominal incision was performed after anesthesia confirmed timeout conducted and procedure confirmed by surgical team.  Ancef was administered prophylactically 2 g.  Transverse lower abdominal incision was made in the method of  Pfannenstiel, with sharp dissection of the fascia, transverse fascial opening, midline opening into the peritoneum and Alexis retractor positioned.  Lower uterine segment had some large varicosities particularly on the left side.  The uterus was slightly rotated to the patient's right.  The incision location was adjusted accordingly to the right and opened transversely, extended laterally was with traction and then cephalad and caudad further.  The fetal vertex easily be expelled from the thick myometrium due to the bulk of the myometrium and so Kiwi vacuum extractor was placed on the occiput.  Easily extracted.  Cord was clamped at 1 minute bulb suctioning performed of the nose and pharynx.  The baby showed a faulty with breathing that required pediatrician to take the baby to the NICU after efforts to supporting the baby on the warmer for quite some time. Placenta had been delivered in response to daily uterine massage with no membrane remnants of significance.  The uterus was closed in a running locking first layer of 0 Monocryl in a continuous running second layer of the tissue reapproximation was very good.  The fundus of the uterus was palpated thoroughly both during the placental removal and at the end of the case and there were no defects and no adhesions.  Abdomen was irrigated with saline solution, anterior peritoneum closed with 0 Vicryl, and the with the fascia closed with continuous running 0 Vicryl as well.  The subcutaneous tissues were irrigated confirmed as hemostatic, reapproximated with 3 interrupted horizontal mattress sutures of 2-0 Vicryl followed by 4-0 Vicryl subcuticular skin closure sponge and needle counts were correct.  Most of the blood loss occurred during the delivery of the fetal head  due to the extensive varicosities in the lower uterine segment.  Sponge and needle counts were correct

## 2018-04-15 ENCOUNTER — Encounter: Payer: Medicaid Other | Admitting: Obstetrics and Gynecology

## 2018-04-15 LAB — CBC
HCT: 25.8 % — ABNORMAL LOW (ref 36.0–46.0)
Hemoglobin: 8.9 g/dL — ABNORMAL LOW (ref 12.0–15.0)
MCH: 30.8 pg (ref 26.0–34.0)
MCHC: 34.5 g/dL (ref 30.0–36.0)
MCV: 89.3 fL (ref 78.0–100.0)
Platelets: 142 10*3/uL — ABNORMAL LOW (ref 150–400)
RBC: 2.89 MIL/uL — ABNORMAL LOW (ref 3.87–5.11)
RDW: 16.5 % — ABNORMAL HIGH (ref 11.5–15.5)
WBC: 11.5 10*3/uL — ABNORMAL HIGH (ref 4.0–10.5)

## 2018-04-15 LAB — BIRTH TISSUE RECOVERY COLLECTION (PLACENTA DONATION)

## 2018-04-15 MED ORDER — POLYETHYLENE GLYCOL 3350 17 G PO PACK
17.0000 g | PACK | Freq: Every day | ORAL | Status: DC
  Administered 2018-04-15 – 2018-04-17 (×3): 17 g via ORAL
  Filled 2018-04-15 (×3): qty 1

## 2018-04-15 MED ORDER — FERROUS GLUCONATE 324 (38 FE) MG PO TABS
324.0000 mg | ORAL_TABLET | Freq: Two times a day (BID) | ORAL | Status: DC
  Administered 2018-04-15 – 2018-04-17 (×4): 324 mg via ORAL
  Filled 2018-04-15 (×7): qty 1

## 2018-04-15 NOTE — Lactation Note (Signed)
This note was copied from a baby's chart. Lactation Consultation Note  Patient Name: Diane [name] [id number] Date: 04/15/2018 Reason for consult: Follow-up assessment   P3, Baby 28 hours old.  Ex BF>  [redacted]w[redacted]d< 6 lbs. Reviewed hand expression with mother w/ drops expressed. Mother has not pumped since 1200a.  Suggest she pump after every other feeding or 4-6 times per day. Encouraged mother to hand express before and after pumping and before feedings. Recommend limiting feedings to 30 min. Give supplemental breastmilk/formula after feedings.  Mother has 2 personal DEBP at home.  Discussed hands on pumping.     Maternal Data Has patient been taught Hand Expression?: Yes  Feeding Feeding Type: Breast Fed  LATCH Score Latch: Repeated attempts needed to sustain latch, nipple held in mouth throughout feeding, stimulation needed to elicit sucking reflex.  Audible Swallowing: A few with stimulation  Type of Nipple: Everted at rest and after stimulation  Comfort (Breast/Nipple): Soft / non-tender  Hold (Positioning): Assistance needed to correctly position infant at breast and maintain latch.  LATCH Score: 7  Interventions Interventions: DEBP;Hand express  Lactation Tools Discussed/Used     Consult Status Consult Status: Follow-up Date: 04/16/18 Follow-up type: In-patient    BVivianne MasterBProvidence Holy Cross Medical Center7/23/2019, 12:10 PM

## 2018-04-15 NOTE — Progress Notes (Signed)
Daily Post Partum Note  04/15/2018 Diane [name] is a 37 y.o. [id number]  POD#1 s/p pLTCS @ 34w4dfor h/o myomectomy (EBL 13046m.  Pregnancy c/b cronh's disease 24hr/overnight events:  none  Subjective:  +flatus, meeting all post op goals  Objective:    Current Vital Signs 24h Vital Sign Ranges  T 99.2 F (37.3 C) Temp  Avg: 98.6 F (37 C)  Min: 98 F (36.7 C)  Max: 99.2 F (37.3 C)  BP 116/75 BP  Min: 114/62  Max: 134/89  HR 67 Pulse  Avg: 66.2  Min: 60  Max: 71  RR 18 Resp  Avg: 17.7  Min: 16  Max: 18  SaO2 100 % Room Air SpO2  Avg: 97.8 %  Min: 95 %  Max: 100 %       24 Hour I/O Current Shift I/O  Time Ins Outs 07/22 0701 - 07/23 0700 In: 2485.3 [I.V.:2310.3] Out: 2800 [Urine:2700] No intake/output data recorded.    General: NAD Abdomen: soft, nttp Firm fundus below umbilicus, +BS. Incision dressing c/d/i Skin:  Warm and dry.  Cardiovascular: S1, S2 normal, no murmur, rub or gallop, regular rate and rhythm Respiratory:  Clear to auscultation bilateral. Normal respiratory effort Extremities: no c/c/e  Medications Current Facility-Administered Medications  Medication Dose Route Frequency Provider Last Rate Last Dose  . acetaminophen (TYLENOL) tablet 650 mg  650 mg Oral Q4H PRN FeJonnie KindMD   650 mg at 04/15/18 0913  . coconut oil  1 application Topical PRN FeJonnie KindMD   1 application at 0719/50/93015  . witch hazel-glycerin (TUCKS) pad 1 application  1 application Topical PRN FeJonnie KindMD       And  . dibucaine (NUPERCAINAL) 1 % rectal ointment 1 application  1 application Rectal PRN FeJonnie KindMD      . diphenhydrAMINE (BENADRYL) capsule 25 mg  25 mg Oral Q6H PRN FeJonnie KindMD   25 mg at 04/14/18 1822  . enoxaparin (LOVENOX) injection 40 mg  40 mg Subcutaneous Q24H FeJonnie KindMD   40 mg at 04/15/18 0913  . ferrous gluconate (FERGON) tablet 324 mg  324 mg Oral BID WC PiAletha HalimMD      . ibuprofen (ADVIL,MOTRIN)  tablet 600 mg  600 mg Oral Q6H PRN PiAletha HalimMD   600 mg at 04/15/18 0914  . loratadine (CLARITIN) tablet 10 mg  10 mg Oral Daily FeJonnie KindMD   10 mg at 04/15/18 0914  . menthol-cetylpyridinium (CEPACOL) lozenge 3 mg  1 lozenge Oral Q2H PRN FeJonnie KindMD      . nalbuphine (NUBAIN) injection 5 mg  5 mg Intravenous Q4H PRN FoJosephine IgoMD       Or  . nalbuphine (NUBAIN) injection 5 mg  5 mg Subcutaneous Q4H PRN FoJosephine IgoMD      . naloxone (N[facility] Psinjection 0.4 mg  0.4 mg Intravenous PRN FoJosephine IgoMD       And  . sodium chloride flush (NS) 0.9 % injection 3 mL  3 mL Intravenous PRN FoJosephine IgoMD   3 mL at 04/15/18 0914  . naloxone HCl (NARCAN) 2 mg in dextrose 5 % 250 mL infusion  1-4 mcg/kg/hr Intravenous Continuous PRN FoJosephine IgoMD      . ondansetron (ZThe [facility] Clinicinjection 4 mg  4 mg Intravenous Q8H PRN FoJosephine IgoMD   4 mg at 04/14/18 1233  . oxyCODONE (Oxy  IR/ROXICODONE) immediate release tablet 10 mg  10 mg Oral Q4H PRN Jonnie Kind, MD      . oxyCODONE (Oxy IR/ROXICODONE) immediate release tablet 5 mg  5 mg Oral Q4H PRN Jonnie Kind, MD   5 mg at 04/15/18 0914  . polyethylene glycol (MIRALAX / GLYCOLAX) packet 17 g  17 g Oral Daily Aletha Halim, MD   17 g at 04/15/18 0914  . prenatal multivitamin tablet 1 tablet  1 tablet Oral Q1200 Jonnie Kind, MD      . senna-docusate (Senokot-S) tablet 2 tablet  2 tablet Oral QHS PRN Aletha Halim, MD      . simethicone (MYLICON) chewable tablet 80 mg  80 mg Oral TID PC Jonnie Kind, MD   80 mg at 04/15/18 0914    Labs:  Recent Labs  Lab 04/14/18 0215 04/14/18 1446 04/15/18 0524  WBC 6.7 11.3* 11.5*  HGB 10.1* 10.3* 8.9*  HCT 30.0* 30.1* 25.8*  PLT 172 162 142*   Recent Labs  Lab 04/14/18 0215  NA 134*  K 3.1*  CL 103  CO2 21*  BUN 7  CREATININE 0.45  GLUCOSE 92  CALCIUM 7.8*    Assessment & Plan:  Pt doing well *Postpartum/postop: routine care. Start  iron *Dispo: likely POD#3  Durene Romans MD Attending Center for [facility])

## 2018-04-16 LAB — CBC
HCT: 26.9 % — ABNORMAL LOW (ref 36.0–46.0)
Hemoglobin: 9.1 g/dL — ABNORMAL LOW (ref 12.0–15.0)
MCH: 30.8 pg (ref 26.0–34.0)
MCHC: 33.8 g/dL (ref 30.0–36.0)
MCV: 91.2 fL (ref 78.0–100.0)
Platelets: 131 10*3/uL — ABNORMAL LOW (ref 150–400)
RBC: 2.95 MIL/uL — ABNORMAL LOW (ref 3.87–5.11)
RDW: 17.5 % — ABNORMAL HIGH (ref 11.5–15.5)
WBC: 8.8 10*3/uL (ref 4.0–10.5)

## 2018-04-16 MED ORDER — BREAST MILK
ORAL | Status: DC
  Filled 2018-04-16: qty 1

## 2018-04-16 NOTE — Progress Notes (Signed)
Daily Post Partum Note  04/16/2018 Diane [name] is a 37 y.o. [id number]  POD#2 s/p pLTCS @ 10w4dfor h/o myomectomy (EBL 13053m.  Pregnancy c/b cronh's disease 24hr/overnight events:  none  Subjective:  +flatus, still meeting all post op goals  Objective:    Current Vital Signs 24h Vital Sign Ranges  T 98.2 F (36.8 C) Temp  Avg: 98.5 F (36.9 C)  Min: 98.2 F (36.8 C)  Max: 98.8 F (37.1 C)  BP 123/79 BP  Min: 116/77  Max: 125/65  HR 69 Pulse  Avg: 73  Min: 69  Max: 77  RR 18 Resp  Avg: 17.8  Min: 17  Max: 18  SaO2 100 % Room Air SpO2  Avg: 98.6 %  Min: 97 %  Max: 100 %       24 Hour I/O Current Shift I/O  Time Ins Outs 07/23 0701 - 07/24 0700 In: -  Out: 800 [Urine:800] No intake/output data recorded.    General: NAD Abdomen: soft, nttp. Incision dressing c/d/i Skin:  Warm and dry.  Cardiovascular: S1, S2 normal, no murmur, rub or gallop, regular rate and rhythm Respiratory:  Clear to auscultation bilateral. Normal respiratory effort Extremities: no c/c/e  Medications Current Facility-Administered Medications  Medication Dose Route Frequency Provider Last Rate Last Dose  . acetaminophen (TYLENOL) tablet 650 mg  650 mg Oral Q4H PRN FeJonnie KindMD   650 mg at 04/15/18 0913  . coconut oil  1 application Topical PRN FeJonnie KindMD   1 application at 0729/51/88325  . witch hazel-glycerin (TUCKS) pad 1 application  1 application Topical PRN FeJonnie KindMD       And  . dibucaine (NUPERCAINAL) 1 % rectal ointment 1 application  1 application Rectal PRN FeJonnie KindMD      . diphenhydrAMINE (BENADRYL) capsule 25 mg  25 mg Oral Q6H PRN FeJonnie KindMD   25 mg at 04/14/18 1822  . enoxaparin (LOVENOX) injection 40 mg  40 mg Subcutaneous Q24H FeJonnie KindMD   40 mg at 04/16/18 0727  . ferrous gluconate (FERGON) tablet 324 mg  324 mg Oral BID WC PiAletha HalimMD   324 mg at 04/16/18 0728  . ibuprofen (ADVIL,MOTRIN) tablet 600 mg  600 mg  Oral Q6H PRN PiAletha HalimMD   600 mg at 04/16/18 0733  . loratadine (CLARITIN) tablet 10 mg  10 mg Oral Daily FeJonnie KindMD   10 mg at 04/15/18 0914  . menthol-cetylpyridinium (CEPACOL) lozenge 3 mg  1 lozenge Oral Q2H PRN FeJonnie KindMD      . nalbuphine (NUBAIN) injection 5 mg  5 mg Intravenous Q4H PRN FoJosephine IgoMD       Or  . nalbuphine (NUBAIN) injection 5 mg  5 mg Subcutaneous Q4H PRN FoJosephine IgoMD      . naloxone ([facility] 0.[facility] mg  0.4 mg Intravenous PRN FoJosephine IgoMD       And  . sodium chloride flush (NS) 0.9 % injection 3 mL  3 mL Intravenous PRN FoJosephine IgoMD   3 mL at 04/15/18 0914  . naloxone HCl (NARCAN) 2 mg in dextrose 5 % 250 mL infusion  1-4 mcg/kg/hr Intravenous Continuous PRN FoJosephine IgoMD      . ondansetron (ZNorth [facility] Healthinjection 4 mg  4 mg Intravenous Q8H PRN FoJosephine IgoMD   4 mg at 04/14/18 1233  . oxyCODONE (Oxy IR/ROXICODONE)  immediate release tablet 10 mg  10 mg Oral Q4H PRN Jonnie Kind, MD   10 mg at 04/15/18 2134  . oxyCODONE (Oxy IR/ROXICODONE) immediate release tablet 5 mg  5 mg Oral Q4H PRN Jonnie Kind, MD   5 mg at 04/15/18 1348  . polyethylene glycol (MIRALAX / GLYCOLAX) packet 17 g  17 g Oral Daily Aletha Halim, MD   17 g at 04/15/18 0914  . prenatal multivitamin tablet 1 tablet  1 tablet Oral Q1200 Jonnie Kind, MD   1 tablet at 04/15/18 1225  . senna-docusate (Senokot-S) tablet 2 tablet  2 tablet Oral QHS PRN Aletha Halim, MD   2 tablet at 04/15/18 2137  . simethicone (MYLICON) chewable tablet 80 mg  80 mg Oral TID PC Jonnie Kind, MD   80 mg at 04/16/18 4619    Labs:  Recent Labs  Lab 04/14/18 1446 04/15/18 0524 04/16/18 0700  WBC 11.3* 11.5* 8.8  HGB 10.3* 8.9* 9.1*  HCT 30.1* 25.8* 26.9*  PLT 162 142* 131*   Recent Labs  Lab 04/14/18 0215  NA 134*  K 3.1*  CL 103  CO2 21*  BUN 7  CREATININE 0.45  GLUCOSE 92  CALCIUM 7.8*   A POS  Assessment & Plan:   Pt doing well *Postpartum/postop: routine care. Start iron. D/w pt re: [facility] tomorrow. breast *Dispo: likely tomorrow   Durene Romans. MD Attending Center for [facility])

## 2018-04-16 NOTE — Progress Notes (Signed)
Mom educated of various methods to supplement EBM and formula other than nipple. Methods discussed include spoon feeding and syringe feeding.  Mom declined to use these tools and to continue with the nipple at this time.

## 2018-04-17 MED ORDER — POLYETHYLENE GLYCOL 3350 17 G PO PACK
17.0000 g | PACK | Freq: Every day | ORAL | 0 refills | Status: DC
Start: 2018-04-18 — End: 2022-08-11

## 2018-04-17 MED ORDER — ACETAMINOPHEN 325 MG PO TABS: 650.0000 mg | ORAL_TABLET | ORAL | Status: DC | PRN

## 2018-04-17 MED ORDER — OXYCODONE HCL 5 MG PO TABS: 5.0000 mg | ORAL_TABLET | Freq: Four times a day (QID) | ORAL | 0 refills | Status: AC | PRN

## 2018-04-17 MED ORDER — SIMETHICONE 80 MG PO CHEW: 80.0000 mg | CHEWABLE_TABLET | Freq: Four times a day (QID) | ORAL | 0 refills | Status: DC | PRN

## 2018-04-17 MED ORDER — FERROUS GLUCONATE 324 (38 FE) MG PO TABS: 324.0000 mg | ORAL_TABLET | Freq: Two times a day (BID) | ORAL | 0 refills | Status: DC

## 2018-04-17 MED ORDER — IBUPROFEN 600 MG PO TABS: 600.0000 mg | ORAL_TABLET | Freq: Four times a day (QID) | ORAL | 0 refills | Status: DC | PRN

## 2018-04-17 NOTE — Lactation Note (Signed)
This note was copied from a baby's chart. Lactation Consultation Note  Patient Name: [name] [id number] Date: 04/17/2018 Reason for consult: Follow-up assessment;Late-preterm 34-36.6wks;Other (Comment);Infant weight loss(weight stablizing at 8 %, Bili check 10.8 at 64 hours L/I )  Baby is 68 hours old LC reviewed and updated the doc flow sheets per mom  Per mom breast are full , not engorged / and  has not pumped in the last 24 hours. Baby doesn't seem to like the formula. LC encouraged and recommended to mom  Feeding the baby 15 - 20 mins 1 st breast and supplement , post pump both breast for 10 -20 mins  Save milk for next feeding and the baby will like the taste of her breast milk better.  LC discussed supply and demand and the importance of not allowing her breast to stay over full,  And reviewed potential feeding behaviors of LPT, Less than 6 pound baby . Stressed STS feedings  Until the baby is up to birth weight gaining well and can stay awake for a baby  Sore nipple and engorgement prevention and tx reviewed, LC instructed mom on the use shells between feedings Except when sleeping.  Per mom has 2 DEBP at home.  LC encouraged mom to give the baby a week to grow and call back for [facility] appt. For feeding assessment. Mother informed of post-discharge support and given phone number to the lactation department, including services for phone call assistance; out-patient appointments; and breastfeeding support group. List of other breastfeeding resources in the community given in the handout. Encouraged mother to call for problems or concerns related to breastfeeding.   Maternal Data Has patient been taught Hand Expression?: Yes(per mom reminded to hand express before latch, and before and after pumping )  Feeding Feeding Type: (per mom last fed at 0930 ) Length of feed: 20 min(per mom )  LATCH Score                   Interventions Interventions: Breast feeding  basics reviewed;Shells;DEBP  Lactation Tools Discussed/Used Tools: Shells;Pump(LC instructed on use shells due to sensitive tissue ) Shell Type: Inverted Breast pump type: Double-Electric Breast Pump WIC Program: (per mom waiting for [facility] to see her ) Pump Review: Milk Storage   Consult Status Consult Status: Complete(LC enc mom to call back in a wek for [place] [facility] appt. ) Date: 04/17/18    Diane [name] 04/17/2018, 12:05 PM

## 2018-04-17 NOTE — Discharge Instructions (Signed)
Cesarean Delivery, Care After Refer to this sheet in the next few weeks. These instructions provide you with information on caring for yourself after your procedure. Your health care provider may also give you specific instructions. Your treatment has been planned according to current medical practices, but problems sometimes occur. Call your health care provider if you have any problems or questions after you go home. HOME CARE INSTRUCTIONS  Only take over-the-counter or prescription medications as directed by your health care provider.  Do not drink alcohol, especially if you are breastfeeding or taking medication to relieve pain.  Do not  smoke tobacco.  Continue to use good perineal care. Good perineal care includes:  Wiping your perineum from front to back.  Keeping your perineum clean.  Check your surgical cut (incision) daily for increased redness, drainage, swelling, or separation of skin.  Shower and clean your incision gently with soap and water every day, by letting warm and soapy water run over the incision, and then pat it dry. If your health care provider says it is okay, leave the incision uncovered. Use a bandage (dressing) if the incision is draining fluid or appears irritated. If the adhesive strips across the incision do not fall off within 7 days, carefully peel them off, after a shower.  Hug a pillow when coughing or sneezing until your incision is healed. This helps to relieve pain.  Do not use tampons, douches or have sexual intercourse, until your health care provider says it is okay.  Wear a well-fitting bra that provides breast support.  Limit wearing support panties or control-top hose.  Drink enough fluids to keep your urine clear or pale yellow.  Eat high-fiber foods such as whole grain cereals and breads, brown rice, beans, and fresh fruits and vegetables every day. These foods may help prevent or relieve constipation.  Resume activities such as  climbing stairs, driving, lifting, exercising, or traveling as directed by your health care provider.  Try to have someone help you with your household activities and your newborn for at least a few days after you leave the hospital.  Rest as much as possible. Try to rest or take a nap when your newborn is sleeping.  Increase your activities gradually.  Do not lift more than 15lbs until directed by a provider.  Keep all of your scheduled postpartum appointments. It is very important to keep your scheduled follow-up appointments. At these appointments, your health care provider will be checking to make sure that you are healing physically and emotionally. SEEK MEDICAL CARE IF:   You are passing large clots from your vagina. Save any clots to show your health care provider.  You have a foul smelling discharge from your vagina.  You have trouble urinating.  You are urinating frequently.  You have pain when you urinate.  You have a change in your bowel movements.  You have increasing redness, pain, or swelling near your incision.  You have pus draining from your incision.  Your incision is separating.  You have painful, hard, or reddened breasts.  You have a severe headache.  You have blurred vision or see spots.  You feel sad or depressed.  You have thoughts of hurting yourself or your newborn.  You have questions about your care, the care of your newborn, or medications.  You are dizzy or light-headed.  You have a rash.  You have pain, redness, or swelling at the site of the removed intravenous access (IV) tube.  You have nausea  or vomiting.  You stopped breastfeeding and have not had a menstrual period within 12 weeks of stopping.  You are not breastfeeding and have not had a menstrual period within 12 weeks of delivery.  You have a fever. SEEK IMMEDIATE MEDICAL CARE IF:  You have persistent pain.  You have chest pain.  You have shortness of breath.  You  faint.  You have leg pain.  You have stomach pain.  Your vaginal bleeding saturates 2 or more sanitary pads in 1 hour. MAKE SURE YOU:   Understand these instructions.  Will watch your condition.  Will get help right away if you are not doing well or get worse. Document Released: 06/02/2002 Document Revised: 01/25/2014 Document Reviewed: 05/07/2012 [facility] Patient Information 2015 [place], [place]. This information is not intended to replace advice given to you by your health care provider. Make sure you discuss any questions you have with your health care provider.

## 2018-04-17 NOTE — Progress Notes (Signed)
Obstetrical Discharge Summary  Date of Admission: 04/14/2018 Date of Discharge: 04/17/2018  Primary OB: [facility]  Gestational Age at Delivery: [redacted]w[redacted]d  Antepartum complications: history of myomectomy (treated as classical incision), AMA, Crohn's Reason for Admission: PPROM @ 35/4 Date of Delivery: 04/14/2018  Delivered By: JMallory Shirk MD Delivery Type: primary cesarean section, low transverse incision Intrapartum complications/course: PPH due to very vascular uterine tissue Anesthesia: spinal Placenta: Delivered and expressed via active management. Intact: yes. To pathology: no.  Laceration: n/a Episiotomy: n/a EBL: 13672mBaby: Liveborn female, APGARs 6/8, weight 2720 g.    Discharge Diagnosis: Delivered. Postoperative Anemia.   Postpartum course: Uncomplicated. Patient didn't need a blood transfusion and was meeting all postop goals on the day of discharge, including flatus.   CBC Latest Ref Rng & Units 04/16/2018 04/15/2018 04/14/2018  WBC 4.0 - 10.5 K/uL 8.8 11.5(H) 11.3(H)  Hemoglobin 12.0 - 15.0 g/dL 9.1(L) 8.9(L) 10.3(L)  Hematocrit 36.0 - 46.0 % 26.9(L) 25.8(L) 30.1(L)  Platelets 150 - 400 K/uL 131(L) 142(L) 162   Discharge Vital Signs:  Current Vital Signs 24h Vital Sign Ranges  T 98.4 F (36.9 C) Temp  Avg: 98.4 F (36.9 C)  Min: 98.2 F (36.8 C)  Max: 98.7 F (37.1 C)  BP 124/71 BP  Min: 121/72  Max: 130/79  HR 78 Pulse  Avg: 79.6  Min: 75  Max: 86  RR 18 Resp  Avg: 17.6  Min: 17  Max: 18  SaO2 100 % Room Air SpO2  Avg: 98.8 %  Min: 96 %  Max: 100 %       24 Hour I/O Current Shift I/O  Time Ins Outs No intake/output data recorded. No intake/output data recorded.   Discharge Exam:  NAD Perineum: deferred Abdomen: firm fundus below the umbilicus, NTTP, non distended, +bowel sounds.  Incision c/d/i with honeycomb dressing in place RRR no MRGs CTAB Ext: no c/c/e  Disposition: Home  Rh Immune globulin given: not applicable Rubella  vaccine given: not applicable Tdap vaccine given in AP or PP setting: yes  Contraception: no method  Prenatal/Postnatal Panel: A POS//Rubella Immune//Varicella Unknown//RPR negative//HIV negative/HepB Surface Ag negative//pap no abnormalities (date: 2019)//plans to breastfeed  Plan:  Diane [name] discharged to home in good condition. Follow-up appointment with an incision check in 1 week.  Future Appointments  Date Time Provider [place]7/29/2019  1:00 PM [facility]  05/23/2018  1:55 PM Rasch, JeArtist PaisNP [facility]    Discharge Medications: Allergies as of 04/17/2018   No Known Allergies     Medication List    TAKE these medications   acetaminophen 325 MG tablet Commonly known as:  TYLENOL Take 2 tablets (650 mg total) by mouth every 4 (four) hours as needed (for pain scale < 4).   ferrous gluconate 324 MG tablet Commonly known as:  FERGON Take 1 tablet (324 mg total) by mouth 2 (two) times daily with a meal.   ibuprofen 600 MG tablet Commonly known as:  ADVIL,MOTRIN Take 1 tablet (600 mg total) by mouth every 6 (six) hours as needed for headache, mild pain or cramping.   multivitamin-prenatal 27-0.8 MG Tabs tablet Take 1 tablet by mouth daily at 12 noon.   oxyCODONE 5 MG immediate release tablet Commonly known as:  Oxy IR/ROXICODONE Take 1-2 tablets (5-10 mg total) by mouth every 6 (six) hours as needed for up to 7 days (pain scale 4-7).   polyethylene glycol packet Commonly  known as:  MIRALAX / GLYCOLAX Take 17 g by mouth daily.   ranitidine 150 MG tablet Commonly known as:  ZANTAC Take 1 tablet (150 mg total) by mouth 2 (two) times daily.   simethicone 80 MG chewable tablet Commonly known as:  MYLICON Chew 1 tablet (80 mg total) by mouth 4 (four) times daily as needed for flatulence.       Durene Romans MD Attending Center for [facility])

## 2018-04-21 ENCOUNTER — Encounter: Payer: Medicaid Other | Admitting: Family Medicine

## 2018-04-21 ENCOUNTER — Ambulatory Visit: Payer: Medicaid Other

## 2018-04-21 ENCOUNTER — Ambulatory Visit (INDEPENDENT_AMBULATORY_CARE_PROVIDER_SITE_OTHER): Payer: Medicaid Other | Admitting: *Deleted

## 2018-04-21 ENCOUNTER — Encounter: Payer: Medicaid Other | Admitting: Obstetrics & Gynecology

## 2018-04-21 VITALS — BP 136/83 | HR 95

## 2018-04-21 DIAGNOSIS — R52 Pain, unspecified: Secondary | ICD-10-CM

## 2018-04-21 MED ORDER — IBUPROFEN 600 MG PO TABS: 600.0000 mg | ORAL_TABLET | Freq: Four times a day (QID) | ORAL | 0 refills | Status: DC | PRN

## 2018-04-21 NOTE — Discharge Summary (Signed)
Obstetrical Discharge Summary  Date of Admission: 04/14/2018 Date of Discharge: 04/17/2018  Primary OB: [facility]  Gestational Age at Delivery: [redacted]w[redacted]d  Antepartum complications: history of myomectomy (treated as classical incision), AMA, Crohn's Reason for Admission: PPROM @ 35/4 Date of Delivery: 04/14/2018  Delivered By: JMallory Shirk MD Delivery Type: primary cesarean section, low transverse incision Intrapartum complications/course: PPH due to very vascular uterine tissue Anesthesia: spinal Placenta: Delivered and expressed via active management. Intact: yes. To pathology: no.  Laceration: n/a Episiotomy: n/a EBL: 13668mBaby: Liveborn female, APGARs 6/8, weight 2720 g.    Discharge Diagnosis: Delivered. Postoperative Anemia.   Postpartum course: Uncomplicated. Patient didn't need a blood transfusion and was meeting all postop goals on the day of discharge, including flatus.   CBC Latest Ref Rng & Units 04/16/2018 04/15/2018 04/14/2018  WBC 4.0 - 10.5 K/uL 8.8 11.5(H) 11.3(H)  Hemoglobin 12.0 - 15.0 g/dL 9.1(L) 8.9(L) 10.3(L)  Hematocrit 36.0 - 46.0 % 26.9(L) 25.8(L) 30.1(L)  Platelets 150 - 400 K/uL 131(L) 142(L) 162   Discharge Vital Signs:  Current Vital Signs 24h Vital Sign Ranges  T 98.4 F (36.9 C) Temp  Avg: 98.4 F (36.9 C)  Min: 98.2 F (36.8 C)  Max: 98.7 F (37.1 C)  BP 124/71 BP  Min: 121/72  Max: 130/79  HR 78 Pulse  Avg: 79.6  Min: 75  Max: 86  RR 18 Resp  Avg: 17.6  Min: 17  Max: 18  SaO2 100 % Room Air SpO2  Avg: 98.8 %  Min: 96 %  Max: 100 %       24 Hour I/O Current Shift I/O  Time Ins Outs No intake/output data recorded. No intake/output data recorded.   Discharge Exam:  NAD Perineum: deferred Abdomen: firm fundus below the umbilicus, NTTP, non distended, +bowel sounds.  Incision c/d/i with honeycomb dressing in place RRR no MRGs CTAB Ext: no c/c/e  Disposition: Home  Rh Immune globulin given: not  applicable Rubella vaccine given: not applicable Tdap vaccine given in AP or PP setting: yes  Contraception: no method  Prenatal/Postnatal Panel: A POS//Rubella Immune//Varicella Unknown//RPR negative//HIV negative/HepB Surface Ag negative//pap no abnormalities (date: 2019)//plans to breastfeed  Plan:  Diane [name] discharged to home in good condition. Follow-up appointment with an incision check in 1 week.        Future Appointments  Date Time Provider [place]7/29/2019  1:00 PM [facility]  05/23/2018  1:55 PM Rasch, JeArtist PaisNP [facility]    Discharge Medications: Allergies as of 04/17/2018   No Known Allergies        Medication List    TAKE these medications   acetaminophen 325 MG tablet Commonly known as:  TYLENOL Take 2 tablets (650 mg total) by mouth every 4 (four) hours as needed (for pain scale < 4).   ferrous gluconate 324 MG tablet Commonly known as:  FERGON Take 1 tablet (324 mg total) by mouth 2 (two) times daily with a meal.   ibuprofen 600 MG tablet Commonly known as:  ADVIL,MOTRIN Take 1 tablet (600 mg total) by mouth every 6 (six) hours as needed for headache, mild pain or cramping.   multivitamin-prenatal 27-0.8 MG Tabs tablet Take 1 tablet by mouth daily at 12 noon.   oxyCODONE 5 MG immediate release tablet Commonly known as:  Oxy IR/ROXICODONE Take 1-2 tablets (5-10 mg total) by mouth every 6 (six) hours as needed for up to 7 days (  pain scale 4-7).   polyethylene glycol packet Commonly known as:  MIRALAX / GLYCOLAX Take 17 g by mouth daily.   ranitidine 150 MG tablet Commonly known as:  ZANTAC Take 1 tablet (150 mg total) by mouth 2 (two) times daily.   simethicone 80 MG chewable tablet Commonly known as:  MYLICON Chew 1 tablet (80 mg total) by mouth 4 (four) times daily as needed for flatulence.       Durene Romans MD Attending Center for [facility] [facility])

## 2018-04-21 NOTE — Progress Notes (Signed)
I agree with nurse's A/P.

## 2018-04-21 NOTE — Progress Notes (Signed)
Here for wound check. Declined weight check.  Honeycomb dressing clean, dry , and intact. States doctor told her we could remove it in office because she was nervous.  Dressing removed. Incision clean , dry , intact with steristrips with scant, dark dried  blood noted.  Instructed patient to shower daily and check incision daily and call us if any symptoms of redness, discharge, edema, fever, etc.  She voices understanding. She has pp appt scheduled.  Also instructed her to remove 1-2 steristrips daily as they loosen up and remove all within next 1-2 weeks.

## 2018-04-22 NOTE — Progress Notes (Signed)
Agree with A & P.

## 2018-04-23 ENCOUNTER — Encounter (HOSPITAL_COMMUNITY)
Admission: RE | Admit: 2018-04-23 | Discharge: 2018-04-23 | Disposition: A | Discharge status: Home / Self Care | Payer: Medicaid Other | Source: Ambulatory Visit | Attending: Family Medicine | Admitting: Family Medicine

## 2018-04-24 ENCOUNTER — Inpatient Hospital Stay (HOSPITAL_COMMUNITY): Admit: 2018-04-24 | Payer: Medicaid Other | Admitting: Obstetrics & Gynecology

## 2018-04-28 ENCOUNTER — Encounter: Payer: Medicaid Other | Admitting: Family Medicine

## 2018-05-02 ENCOUNTER — Encounter: Payer: Self-pay | Admitting: *Deleted

## 2018-05-05 ENCOUNTER — Encounter: Payer: Medicaid Other | Admitting: Family Medicine

## 2018-05-12 ENCOUNTER — Encounter: Payer: Medicaid Other | Admitting: Family Medicine

## 2018-05-19 ENCOUNTER — Encounter: Payer: Medicaid Other | Admitting: Obstetrics & Gynecology

## 2018-05-23 ENCOUNTER — Ambulatory Visit: Payer: Medicaid Other | Admitting: Obstetrics and Gynecology

## 2018-06-04 ENCOUNTER — Ambulatory Visit (INDEPENDENT_AMBULATORY_CARE_PROVIDER_SITE_OTHER): Payer: Medicaid Other | Admitting: Obstetrics and Gynecology

## 2018-06-04 ENCOUNTER — Encounter: Payer: Self-pay | Admitting: Family Medicine

## 2018-06-04 ENCOUNTER — Encounter: Payer: Self-pay | Admitting: Obstetrics and Gynecology

## 2018-06-04 NOTE — Progress Notes (Signed)
Obstetrics and Gynecology Postpartum Visit  Appointment Date: 06/05/2018  OBGYN Clinic: [facility]  Primary Care Provider: Bartholome Bill  Chief Complaint:  Chief Complaint  Patient presents with  . Postpartum Care    History of Present Illness: Diane [name] is a 37 y.o. African-American [phone] (Patient's last menstrual period was 05/15/2017.), seen for the above chief complaint. Her past medical history is significant for h/o myomectomy, Crohn's.    She is s/p pLTCS on 7/22 by Dr. Glo Herring for PPROM @ 35wks; she was discharged to home on POD#3  Vaginal bleeding or discharge: she had about 3 days of bleeding about a month PP and then a few days after that but none since Breast or formula feeding: breast Intercourse: No  Contraception after delivery: abstinence PP depression s/s: No  Pap smear: no abnormalities (date: 2019)  Review of Systems:as noted in the History of Present Illness.  Patient Active Problem List   Diagnosis Date Noted  . Status post primary low transverse cesarean section 04/14/2018  . Anemia affecting pregnancy in third trimester 04/07/2018  . Medication exposure during first trimester of pregnancy 02/18/2018  . Supervision of high risk pregnancy, antepartum 01/21/2018  . AMA (advanced maternal age) multigravida 35+ 01/21/2018  . H/O myomectomy 01/21/2018  . Low back pain radiating to left leg 10/04/2015  . Intractable migraine without aura 06/19/2013  . Vertigo 06/13/2013  . Headache 06/13/2013  . History of endometriosis 06/13/2013  . [place] INTESTINE 07/27/2009  . GASTROESOPHAGEAL REFLUX DISEASE, MILD 07/12/2008  . Maternal Crohn's disease affecting pregnancy in third trimester ([place]) 07/12/2008    Medications Donasia N. Aro "[name]" had no medications administered during this visit. Current Outpatient Medications  Medication Sig Dispense Refill  . acetaminophen (TYLENOL) 325 MG tablet Take  2 tablets (650 mg total) by mouth every 4 (four) hours as needed (for pain scale < 4).    . ibuprofen (ADVIL,MOTRIN) 600 MG tablet Take 1 tablet (600 mg total) by mouth every 6 (six) hours as needed for headache, mild pain or cramping. 30 tablet 0  . polyethylene glycol (MIRALAX / GLYCOLAX) packet Take 17 g by mouth daily. 30 each 0  . Prenatal Vit-Fe Fumarate-FA (MULTIVITAMIN-PRENATAL) 27-0.8 MG TABS tablet Take 1 tablet by mouth daily at 12 noon.    . ferrous gluconate (FERGON) 324 MG tablet Take 1 tablet (324 mg total) by mouth 2 (two) times daily with a meal. (Patient not taking: Reported on 04/21/2018) 60 tablet 0  . ranitidine (ZANTAC) 150 MG tablet Take 1 tablet (150 mg total) by mouth 2 (two) times daily. (Patient not taking: Reported on 06/04/2018) 60 tablet 2  . simethicone (MYLICON) 80 MG chewable tablet Chew 1 tablet (80 mg total) by mouth 4 (four) times daily as needed for flatulence. (Patient not taking: Reported on 06/04/2018) 30 tablet 0   No current facility-administered medications for this visit.     Allergies Patient has no known allergies.  Physical Exam:  BP 135/89   Pulse 78   Ht 5' 7"  (1.702 m)   Wt 181 lb 1.6 oz (82.1 kg)   LMP 05/15/2017   Breastfeeding? Yes   BMI 28.36 kg/m  Body mass index is 28.36 kg/m. General appearance: Well nourished, well developed female in no acute distress.  Respiratory:  Normal respiratory effort Abdomen: soft, nttp, nd, well healed low transverse incision Neuro/Psych:  Normal mood and affect.  Skin:  Warm and dry.   Laboratory: none  PP  Depression Screening:  EPDS score zero  Assessment: pt doing well  Plan:  Pt breastfeeding around 2-4 hours. D/w her re: may not be frequently to suppress ovulation and recommend BC. Pt declines any for today. AUB expectations given breastfeeding d/w her.   RTC PRN  Durene Romans MD Attending Center for [employer])

## 2018-06-04 NOTE — Progress Notes (Signed)
Pt does not want to discuss Birth Control. 

## 2018-06-24 ENCOUNTER — Ambulatory Visit: Payer: Medicaid Other | Admitting: Obstetrics and Gynecology

## 2018-06-25 ENCOUNTER — Encounter: Payer: Self-pay | Admitting: *Deleted

## 2019-04-13 ENCOUNTER — Ambulatory Visit: Payer: Managed Care, Other (non HMO) | Admitting: Obstetrics and Gynecology

## 2019-05-18 ENCOUNTER — Encounter: Payer: Self-pay | Admitting: Obstetrics and Gynecology

## 2019-05-18 ENCOUNTER — Ambulatory Visit (INDEPENDENT_AMBULATORY_CARE_PROVIDER_SITE_OTHER): Payer: Medicaid Other | Admitting: Obstetrics and Gynecology

## 2019-05-18 ENCOUNTER — Other Ambulatory Visit: Payer: Self-pay

## 2019-05-18 VITALS — BP 131/85 | HR 84 | Temp 98.7°F | Ht 67.0 in | Wt 196.6 lb

## 2019-05-18 DIAGNOSIS — N946 Dysmenorrhea, unspecified: Secondary | ICD-10-CM

## 2019-05-18 DIAGNOSIS — Z0000 Encounter for general adult medical examination without abnormal findings: Secondary | ICD-10-CM | POA: Diagnosis not present

## 2019-05-18 DIAGNOSIS — Z8742 Personal history of other diseases of the female genital tract: Secondary | ICD-10-CM

## 2019-05-18 NOTE — Progress Notes (Signed)
Obstetrics and Gynecology Annual Patient Evaluation  Appointment Date: 05/18/2019  OBGYN Clinic: [facility] for [facility]  Primary Care Provider: Bartholome [name]  Referring Provider: Bartholome Bill, MD  Chief Complaint:  Chief Complaint  Patient presents with  . Gynecologic Exam    History of Present Illness: Diane [name] is a 38 y.o. African-American [phone] (Patient's last menstrual period was 05/07/2019 (exact date).), seen for the above chief complaint. Her past medical history is significant for endometriosis, h/o myomectomy, migraines without aura, Crohn's.   Patient states that starting in June her periods have been really heavy, 7 days, and started out not painful but feels like it may get that way like prior to her endometriosis surgeries.   No breast s/s, nausea, vomiting, abdominal pain, dysuria, hematuria, vaginal itching, dyspareunia, diarrhea, constipation  Review of Systems: as noted in the History of Present Illness.  Patient Active Problem List   Diagnosis Date Noted  . H/O myomectomy 01/21/2018  . Low back pain radiating to left leg 10/04/2015  . Intractable migraine without aura 06/19/2013  . Vertigo 06/13/2013  . Headache 06/13/2013  . History of endometriosis 06/13/2013  . [place] INTESTINE 07/27/2009  . GASTROESOPHAGEAL REFLUX DISEASE, MILD 07/12/2008    Past Medical History:  Past Medical History:  Diagnosis Date  . ADD (attention deficit disorder)   . Anemia   . Anxiety   . Crohn's disease ([place])   . Fibroid   . H/O blood clots    tested positive but not located on doppler   . Headache(784.0)    otc meds - last migraine 2009  . Heart murmur    as child, never had any problems  . History of endometriosis   . Inflammatory bowel disease   . Leiomyoma 02/09/2015  . Low blood potassium    history  . Migraine without aura, with intractable migraine, so stated, without mention of status migrainosus  06/19/2013  . Pneumonia    history back in 2006  . PONV (postoperative nausea and vomiting)   . Seasonal allergies   . Vaginal Pap smear, abnormal   . Vasovagal syncope     Past Surgical History:  Past Surgical History:  Procedure Laterality Date  . ADENOIDECTOMY    . CESAREAN SECTION N/A 04/14/2018   Procedure: PRIMARY CESAREAN SECTION;  Surgeon: Jonnie Kind, MD;  Location: [place];  Service: Obstetrics;  Laterality: N/A;  . DILATATION & CURRETTAGE/HYSTEROSCOPY WITH RESECTOCOPE N/A 06/09/2013   Procedure: DILATATION & CURETTAGE/HYSTEROSCOPY WITH HYSTEROSCOPIC RESECTION OF SUBMUCOSAL FIBROID AND ENDOMETRIAL POLYP;  Surgeon: Marvene Staff, MD;  Location: [place] [facility];  Service: Gynecology;  Laterality: N/A;  . LAPAROSCOPY N/A 06/09/2013   Procedure: LAPAROSCOPY DIAGNOSTIC;  Surgeon: Marvene Staff, MD;  Location: [facility] [facility];  Service: Gynecology;  Laterality: N/A;  . ROBOT ASSISTED MYOMECTOMY N/A 02/09/2015   Procedure: MYOMECTOMY,EXCISION OF ENDOMETRIOSIS,PRE-SACRAL NEURECTOMY;  Surgeon: Governor Specking, MD;  Location: [facility];  Service: Gynecology;  Laterality: N/A;  . ROBOTIC ASSISTED LAPAROSCOPIC LYSIS OF ADHESION N/A 06/09/2013   Procedure: ROBOTIC ASSISTED LAPAROSCOPIC LYSIS OF ADHESION; Resection of Endometriosis and excision of fibroid;  Surgeon: Marvene Staff, MD;  Location: [facility];  Service: Gynecology;  Laterality: N/A;  . TONSILLECTOMY    . TYMPANOSTOMY TUBE PLACEMENT      Past Obstetrical History:  OB History  Gravida Para Term Preterm AB Living  3 3 2 1   3   SAB TAB Ectopic Multiple Live Births  0 3    # Outcome Date GA Lbr Len/2nd Weight Sex Delivery Anes PTL Lv  3 Preterm 04/14/18 [redacted]w[redacted]d 5 lb 15.9 oz (2.72 kg) F CS-Vac Spinal  LIV  2 Term 2008   6 lb 14 oz (3.118 kg) M Vag-Spont   LIV  1 Term 2001   8 lb 14.9 oz (4.051 kg) M Vag-Spont   LIV     Past Gynecological History: As per HPI. History of Pap Smear(s): Yes.   Last pap 2019,  which was negative She is currently using no method for contraception.   Social History:  Social History   Socioeconomic History  . Marital status: Divorced    Spouse name: Not on file  . Number of children: 2  . Years of education: some coll.  . Highest education level: Not on file  Occupational History  . Not on file  Social Needs  . Financial resource strain: Not on file  . Food insecurity    Worry: Not on file    Inability: Not on file  . Transportation needs    Medical: Not on file    Non-medical: Not on file  Tobacco Use  . Smoking status: Never Smoker  . Smokeless tobacco: Never Used  Substance and Sexual Activity  . Alcohol use: No  . Drug use: No  . Sexual activity: Yes    Birth control/protection: None  Lifestyle  . Physical activity    Days per week: Not on file    Minutes per session: Not on file  . Stress: Not on file  Relationships  . Social [place] phone: Not on file    Gets together: Not on file    Attends religious service: Not on file    Active member of club or organization: Not on file    Attends meetings of clubs or organizations: Not on file    Relationship status: Not on file  . Intimate partner violence    Fear of current or ex partner: Not on file    Emotionally abused: Not on file    Physically abused: Not on file    Forced sexual activity: Not on file  Other Topics Concern  . Not on file  Social History Narrative   Patient does not drink caffeine.   Patient is right handed.     Family History:  Family History  Problem Relation Age of Onset  . Heart disease Mother [phone]      CHF  . Crohn's disease Mother   . Asthma Mother   . Asthma Brother   . Heart disease Maternal Aunt   . Diabetes Maternal Aunt   . Stroke Maternal Aunt   . Heart disease Maternal Grandfather   . Stroke Maternal Grandfather     Medications Diane [name] "Diane [name]" had no medications administered during this visit. Current Outpatient  Medications  Medication Sig Dispense Refill  . acetaminophen (TYLENOL) 325 MG tablet Take 2 tablets (650 mg total) by mouth every 4 (four) hours as needed (for pain scale < 4).    . ibuprofen (ADVIL,MOTRIN) 600 MG tablet Take 1 tablet (600 mg total) by mouth every 6 (six) hours as needed for headache, mild pain or cramping. 30 tablet 0  . polyethylene glycol (MIRALAX / GLYCOLAX) packet Take 17 g by mouth daily. 30 each 0  . ranitidine (ZANTAC) 150 MG tablet Take 1 tablet (150 mg total) by mouth 2 (two) times daily. (Patient not taking:  Reported on 06/04/2018) 60 tablet 2  . simethicone (MYLICON) 80 MG chewable tablet Chew 1 tablet (80 mg total) by mouth 4 (four) times daily as needed for flatulence. (Patient not taking: Reported on 06/04/2018) 30 tablet 0   No current facility-administered medications for this visit.     Allergies Patient has no known allergies.   Physical Exam:  BP 131/85   Pulse 84   Temp 98.7 F (37.1 C)   Ht 5' 7"  (1.702 m)   Wt 196 lb 9.6 oz (89.2 kg)   LMP 05/07/2019 (Exact Date)   BMI 30.79 kg/m  Body mass index is 30.79 kg/m. General appearance: Well nourished, well developed female in no acute distress.  Neck:  Supple, normal appearance, and no thyromegaly  Cardiovascular: normal s1 and s2.  No murmurs, rubs or gallops. Respiratory:  Clear to auscultation bilateral. Normal respiratory effort Abdomen: positive bowel sounds and no masses, hernias; diffusely non tender to palpation, non distended Breasts: breasts appear normal, no suspicious masses, no skin or nipple changes or axillary nodes, and normal palpation. Neuro/Psych:  Normal mood and affect.  Skin:  Warm and dry.  Lymphatic:  No inguinal lymphadenopathy.   Pelvic exam: is not limited by body habitus EGBUS: within normal limits, Vagina: within normal limits and with no blood or discharge in the vault, Cervix: normal appearing cervix without tenderness, discharge or lesions. Uterus:  Mobile,  nonenlarged and non tender and Adnexa:  normal adnexa and no mass, fullness, tenderness Rectovaginal: deferred  Laboratory: none  Radiology: none  Assessment: pt stable  Plan:  1. Encounter for annual physical examination excluding gynecological examination in a patient older than 17 years Routine care. Rpt pap in two years.   2. History of endometriosis, Dysmenorrhea D/w her that I recommend she be on some hormone suppression, given her endometriosis history, preferably with progestin only method.  I told her that I'd prefer depo provera, progestin only pills, Orlissa, and Mirena with combined OCPs being last option preferred. She was only depo provera in the past and it didn't seem to help but she hasn't been on anything post op since she got pregnant shortly thereafter; I told her that the depo may have a higher chance of working now that she's had her endo procedure. I also told her about using Edward Qualia (not a permanent option but can work for quite some time and has less chance of psych SE than lupron).  Patient doesn't want anymore children, and patient to consider options.  10 minutes  RTC PRN  Durene Romans MD Attending Center for [facility])

## 2019-05-18 NOTE — Patient Instructions (Signed)
Depo Provera injections Progestin only pills Orlissa pills Mirena IUD

## 2019-05-19 DIAGNOSIS — N946 Dysmenorrhea, unspecified: Secondary | ICD-10-CM | POA: Insufficient documentation

## 2019-12-28 DIAGNOSIS — J301 Allergic rhinitis due to pollen: Secondary | ICD-10-CM | POA: Diagnosis not present

## 2019-12-28 DIAGNOSIS — J309 Allergic rhinitis, unspecified: Secondary | ICD-10-CM | POA: Diagnosis not present

## 2020-07-29 DIAGNOSIS — F900 Attention-deficit hyperactivity disorder, predominantly inattentive type: Secondary | ICD-10-CM | POA: Diagnosis not present

## 2020-07-29 DIAGNOSIS — F331 Major depressive disorder, recurrent, moderate: Secondary | ICD-10-CM | POA: Diagnosis not present

## 2020-08-11 DIAGNOSIS — Z20822 Contact with and (suspected) exposure to covid-19: Secondary | ICD-10-CM | POA: Diagnosis not present

## 2020-08-16 DIAGNOSIS — F331 Major depressive disorder, recurrent, moderate: Secondary | ICD-10-CM | POA: Diagnosis not present

## 2020-08-16 DIAGNOSIS — F900 Attention-deficit hyperactivity disorder, predominantly inattentive type: Secondary | ICD-10-CM | POA: Diagnosis not present

## 2020-09-20 DIAGNOSIS — F900 Attention-deficit hyperactivity disorder, predominantly inattentive type: Secondary | ICD-10-CM | POA: Diagnosis not present

## 2020-09-20 DIAGNOSIS — F331 Major depressive disorder, recurrent, moderate: Secondary | ICD-10-CM | POA: Diagnosis not present

## 2020-10-19 DIAGNOSIS — F900 Attention-deficit hyperactivity disorder, predominantly inattentive type: Secondary | ICD-10-CM | POA: Diagnosis not present

## 2020-10-19 DIAGNOSIS — F331 Major depressive disorder, recurrent, moderate: Secondary | ICD-10-CM | POA: Diagnosis not present

## 2020-11-04 ENCOUNTER — Other Ambulatory Visit: Payer: Self-pay

## 2020-11-04 ENCOUNTER — Inpatient Hospital Stay (HOSPITAL_COMMUNITY)
Admission: AD | Admit: 2020-11-04 | Discharge: 2020-11-04 | Disposition: A | Discharge status: Home / Self Care | Payer: BC Managed Care – PPO | Attending: Obstetrics and Gynecology | Admitting: Obstetrics and Gynecology

## 2020-11-04 DIAGNOSIS — Z3202 Encounter for pregnancy test, result negative: Secondary | ICD-10-CM | POA: Diagnosis not present

## 2020-11-04 DIAGNOSIS — Z789 Other specified health status: Secondary | ICD-10-CM | POA: Insufficient documentation

## 2020-11-04 DIAGNOSIS — N946 Dysmenorrhea, unspecified: Secondary | ICD-10-CM

## 2020-11-04 LAB — POCT PREGNANCY, URINE: Preg Test, Ur: NEGATIVE

## 2020-11-04 NOTE — MAU Provider Note (Signed)
Event Date/Time   First Provider Initiated Contact with Patient 11/04/20 2206      S [name] is a 40 y.o. [phone] patient who presents to MAU today with complaint of vaginal bleeding.  Patient reports she started bleeding on Wednesday and has been saturating through overnight pads on 2 occassions.  Patient states she had an incident of lightheadedness today and was instructed to be evaluated by a friend for possible miscarriage. However, patient did not take a pregnancy test at home.  Patient endorses a history of fibroids and dysmenorrhea.   O BP 134/84 (BP Location: Right Arm)   Pulse 97   Temp 98.6 F (37 C) (Oral)   Resp 18   Ht 5' 7.75" (1.721 m)   Wt 88 kg   LMP 10/07/2020   BMI 29.72 kg/m  Physical Exam Vitals and nursing note reviewed.  Constitutional:      Appearance: She is well-developed.  Eyes:     Conjunctiva/sclera: Conjunctivae normal.  Cardiovascular:     Rate and Rhythm: Normal rate.  Pulmonary:     Effort: Pulmonary effort is normal. No respiratory distress.  Neurological:     Mental Status: She is alert and oriented to person, place, and time.  Psychiatric:        Mood and Affect: Mood normal.        Behavior: Behavior normal.        Thought Content: Thought content normal.     A Medical screening exam complete Dysmenorrhea  P Informed that UPT negative Briefly discussed possible causes for lightheadedness. Strongly recommended patient make appt with primary gyn provider for further evaluation and treatment of dysmenorrhea. Provider offered patient opportunity for transfer to [facility] for further evaluation.  Patient declines. Discussed possible evaluation at [facility] if desired. Patient without further questions or concerns.   Gavin Pound, CNM 11/04/2020 10:06 PM

## 2020-11-04 NOTE — Discharge Instructions (Signed)
Dysmenorrhea Dysmenorrhea refers to cramps caused by the muscles of the uterus tightening (contracting) during a menstrual period. Dysmenorrhea may be mild, or it may be severe enough to interfere with everyday activities for a few days each month. Primary dysmenorrhea is menstrual cramps that last a couple of days when a female starts having menstrual periods or soon after. As a female gets older or has a baby, the cramps will usually lessen or disappear. Secondary dysmenorrhea begins later in life and is caused by a disorder in the reproductive system. It lasts longer, and it may cause more pain than primary dysmenorrhea. The pain may start before the period and last a few days after the period. What are the causes? Dysmenorrhea is usually caused by an underlying problem, such as:  Endometriosis. The tissue that lines the uterus (endometrium) growing outside of the uterus in other areas of the body.  Adenomyosis. Endometrial tissue growing into the muscular walls of the uterus.  Pelvic congestive syndrome. Blood vessels in the pelvis that fill with blood just before the menstrual period.  Overgrowth of cells (polyps) in the endometrium or the lower part of the uterus (cervix).  Uterine prolapse. The uterus dropping down into the vagina due to stretched or weak muscles.  Bladder problems, such as infection or inflammation.  Intestinal problems, such as a tumor or irritable bowel syndrome.  Cancer of the reproductive organs or bladder. Other causes of this condition may result from:  A severely tipped uterus.  A cervix that is closed or has a small opening.  Noncancerous (benign) tumors in the uterus (fibroids).  Pelvic inflammatory disease (PID).  Pelvic scarring (adhesions) from a previous surgery.  An ovarian cyst.  An IUD (intrauterine device). What increases the risk? You are more likely to develop this condition if:  You are younger than 41 years old.  You started  puberty early.  You have irregular or heavy bleeding.  You have never given birth.  You have a family history of dysmenorrhea.  You smoke or use nicotine products.  You have high body weight or a low body weight. What are the signs or symptoms? Symptoms of this condition include:  Cramping, throbbing pain in lower abdomen or lower back, or a feeling of fullness in the lower abdomen.  Periods lasting for longer than 7 days.  Headaches.  Bloating.  Fatigue.  Nausea or vomiting.  Diarrhea or loose stools.  Sweating or dizziness. How is this diagnosed? This condition may be diagnosed based on:  Your symptoms.  Your medical history.  A physical exam.  Blood tests.  A Pap test. This is a test in which cells from the cervix are tested for signs of cancer or infection.  A pregnancy test. You may also have other tests, including:  Imaging tests, such as: ? Ultrasound. ? A procedure to remove and examine a sample of endometrial tissue (dilation and curettage, D&C). ? A procedure to visually examine the inside of:  The uterus (hysteroscopy).  The abdomen or pelvis (laparoscopy).  The bladder (cystoscopy). ? X-rays.  CT scan.  MRI. How is this treated? Treatment depends on the cause of the dysmenorrhea. Treatment may include medicines, such as:  Pain medicines.  Hormone replacement therapy. ? Injections of progesterone to stop the menstrual period. ? Birth control pills that contain the hormone progesterone. ? An IUD that contains the hormone progesterone.  NSAIDs, such as ibuprofen. These may help to stop the production of hormones that cause cramps.  Antidepressant  medicines. Other treatment may include:  Surgery to remove adhesions, endometriosis, ovarian cysts, fibroids, or the entire uterus (hysterectomy).  Endometrial ablation. This is a procedure to destroy the endometrium.  Presacral neurectomy. This is a procedure to cut the nerves in the  bottom of the spine (sacrum) that go to the reproductive organs.  Sacral nerve stimulation. This is a procedure to apply an electric current to nerves in the sacrum.  Exercise and physical therapy.  Meditation, yoga, and acupuncture. Work with your health care provider to determine what treatment or combination of treatments is best for you. Follow these instructions at home: Relieving pain and cramping  If directed, apply heat to your lower back or abdomen when you experience pain or cramps. Use the heat source that your health care provider recommends, such as a moist heat pack or a heating pad. ? Place a towel between your skin and the heat source. ? Leave the heat on for 20-30 minutes. ? Remove the heat if your skin turns bright red. This is especially important if you are unable to feel pain, heat, or cold. You may have a greater risk of getting burned.  Do not sleep with a heating pad on.  Exercise. Activities such as walking, swimming, or biking can help to relieve cramps.  Massage your lower back or abdomen to help relieve pain.   General instructions  Take over-the-counter and prescription medicines only as told by your health care provider.  Ask your health care provider if the medicine prescribed to you requires you to avoid driving or using machinery.  Avoid alcohol and caffeine during and right before your period. These can make cramps worse.  Do not use any products that contain nicotine or tobacco. These products include cigarettes, chewing tobacco, and vaping devices, such as e-cigarettes. If you need help quitting, ask your health care provider.  Keep all follow-up visits. This is important. Contact a health care provider if:  You have pain that gets worse or does not get better with medicine.  You have pain with sex.  You develop nausea or vomiting with your period that is not controlled with medicine. Get help right away if:  You  faint. Summary  Dysmenorrhea refers to cramps caused by the muscles of the uterus tightening (contracting) during a menstrual period.  Dysmenorrhea may be mild, or it may be severe enough to interfere with everyday activities for a few days each month.  Treatment depends on the cause of the dysmenorrhea.  Work with your health care provider to determine what treatment or combination of treatments is best for you. This information is not intended to replace advice given to you by your health care provider. Make sure you discuss any questions you have with your health care provider. Document Revised: 04/27/2020 Document Reviewed: 04/27/2020 Elsevier Patient Education  2021 [employer].

## 2020-11-04 NOTE — MAU Note (Signed)
PT SAYS LMP WAS 1-14. STARTED BLEEDING ON 2-9.   THEN Thursday - PASSED 2 1/2 DOLLAR SIZE - LOOKED LIKE FLESH. NO HPT. NO BC. LAST SEX- SUN .  PAD ON IN TRIAGE - SMALL STREAKS OF BROWNISH.

## 2020-11-14 DIAGNOSIS — Z1231 Encounter for screening mammogram for malignant neoplasm of breast: Secondary | ICD-10-CM | POA: Diagnosis not present

## 2020-11-23 ENCOUNTER — Encounter: Payer: Self-pay | Admitting: Obstetrics and Gynecology

## 2020-11-23 ENCOUNTER — Other Ambulatory Visit (HOSPITAL_COMMUNITY)
Admission: RE | Admit: 2020-11-23 | Discharge: 2020-11-23 | Disposition: A | Discharge status: Home / Self Care | Payer: BC Managed Care – PPO | Source: Ambulatory Visit | Attending: Obstetrics and Gynecology | Admitting: Obstetrics and Gynecology

## 2020-11-23 ENCOUNTER — Other Ambulatory Visit: Payer: Self-pay

## 2020-11-23 ENCOUNTER — Ambulatory Visit (INDEPENDENT_AMBULATORY_CARE_PROVIDER_SITE_OTHER): Payer: BC Managed Care – PPO | Admitting: Obstetrics and Gynecology

## 2020-11-23 VITALS — BP 152/98 | HR 93 | Ht 67.0 in | Wt 190.4 lb

## 2020-11-23 DIAGNOSIS — Z1239 Encounter for other screening for malignant neoplasm of breast: Secondary | ICD-10-CM | POA: Diagnosis not present

## 2020-11-23 DIAGNOSIS — Z8742 Personal history of other diseases of the female genital tract: Secondary | ICD-10-CM

## 2020-11-23 DIAGNOSIS — N920 Excessive and frequent menstruation with regular cycle: Secondary | ICD-10-CM

## 2020-11-23 DIAGNOSIS — Z01419 Encounter for gynecological examination (general) (routine) without abnormal findings: Secondary | ICD-10-CM | POA: Insufficient documentation

## 2020-11-23 NOTE — Progress Notes (Signed)
Obstetrics and Gynecology Annual Patient Evaluation  Appointment Date: 11/23/2020  OBGYN Clinic: [facility] for [facility]  Primary Care Provider: Bartholome Bill  Referring Provider: Self  Chief Complaint:  Chief Complaint  Patient presents with  . Follow-up    History of Present Illness: Diane [name] is a 40 y.o. African-American [phone] (Patient's last menstrual period was 11/02/2020 (exact date).), seen for the above chief complaint. Her past medical history is significant for endometriosis, h/o myomectomy, migraines without aura, Crohn's, HTN  Patient seen in [facility] on 2/11 for heavy period. Pt was negative for pregnancy and f/u made today  She states she would like an annual exam today.     Review of Systems: Pertinent items noted in HPI and remainder of comprehensive ROS otherwise negative.   Patient Active Problem List   Diagnosis Date Noted  . Dysmenorrhea 05/19/2019  . H/O myomectomy 01/21/2018  . Low back pain radiating to left leg 10/04/2015  . Intractable migraine without aura 06/19/2013  . Vertigo 06/13/2013  . Headache 06/13/2013  . History of endometriosis 06/13/2013  . [place] INTESTINE 07/27/2009  . GASTROESOPHAGEAL REFLUX DISEASE, MILD 07/12/2008   Past Medical History:  Past Medical History:  Diagnosis Date  . ADD (attention deficit disorder)   . Anemia   . Anxiety   . Crohn's disease ([place])   . Fibroid   . H/O blood clots    tested positive but not located on doppler   . Headache(784.0)    otc meds - last migraine 2009  . Heart murmur    as child, never had any problems  . History of endometriosis   . Leiomyoma 02/09/2015  . Low blood potassium    history  . Migraine without aura, with intractable migraine, so stated, without mention of status migrainosus 06/19/2013  . Pneumonia    history back in 2006  . PONV (postoperative nausea and vomiting)   . Seasonal allergies   . Vaginal Pap smear,  abnormal   . Vasovagal syncope     Past Surgical History:  Past Surgical History:  Procedure Laterality Date  . ADENOIDECTOMY    . CESAREAN SECTION N/A 04/14/2018   Procedure: PRIMARY CESAREAN SECTION;  Surgeon: Jonnie Kind, MD;  Location: [place];  Service: Obstetrics;  Laterality: N/A;  . DILATATION & CURRETTAGE/HYSTEROSCOPY WITH RESECTOCOPE N/A 06/09/2013   Procedure: DILATATION & CURETTAGE/HYSTEROSCOPY WITH HYSTEROSCOPIC RESECTION OF SUBMUCOSAL FIBROID AND ENDOMETRIAL POLYP;  Surgeon: Marvene Staff, MD;  Location: [facility];  Service: Gynecology;  Laterality: N/A;  . LAPAROSCOPY N/A 06/09/2013   Procedure: LAPAROSCOPY DIAGNOSTIC;  Surgeon: Marvene Staff, MD;  Location: [facility];  Service: Gynecology;  Laterality: N/A;  . ROBOT ASSISTED MYOMECTOMY N/A 02/09/2015   Procedure: MYOMECTOMY,EXCISION OF ENDOMETRIOSIS,PRE-SACRAL NEURECTOMY;  Surgeon: Governor Specking, MD;  Location: [facility];  Service: Gynecology;  Laterality: N/A;  . ROBOTIC ASSISTED LAPAROSCOPIC LYSIS OF ADHESION N/A 06/09/2013   Procedure: ROBOTIC ASSISTED LAPAROSCOPIC LYSIS OF ADHESION; Resection of Endometriosis and excision of fibroid;  Surgeon: Marvene Staff, MD;  Location: [facility];  Service: Gynecology;  Laterality: N/A;  . TONSILLECTOMY    . TYMPANOSTOMY TUBE PLACEMENT      Past Obstetrical History:  OB History  Gravida Para Term Preterm AB Living  3 3 2 1   3   SAB IAB Ectopic Multiple Live Births        0 3    # Outcome Date GA Lbr Len/2nd Weight Sex Delivery  Anes PTL Lv  3 Preterm 04/14/18 [redacted]w[redacted]d 5 lb 15.9 oz (2.72 kg) F CS-Vac Spinal  LIV  2 Term 2008   6 lb 14 oz (3.118 kg) M Vag-Spont   LIV  1 Term 2001   8 lb 14.9 oz (4.051 kg) M Vag-Spont   LIV    Past Gynecological History: As per HPI. Periods: she states that her periods are qmonth, regular, last about a week and are heavy and painful. She states she went to [facility] b/c her period was earlier than usual History of Pap Smear(s):  Yes.   Last pap 2019, which was negative She is currently using no method for contraception.   Social History:  Social History   Socioeconomic History  . Marital status: Divorced    Spouse name: Not on file  . Number of children: 2  . Years of education: some coll.  . Highest education level: Not on file  Occupational History  . Not on file  Tobacco Use  . Smoking status: Never Smoker  . Smokeless tobacco: Never Used  Vaping Use  . Vaping Use: Never used  Substance and Sexual Activity  . Alcohol use: No  . Drug use: No  . Sexual activity: Yes    Birth control/protection: None  Other Topics Concern  . Not on file  Social History Narrative   Patient does not drink caffeine.   Patient is right handed.    Social Determinants of Health   Financial Resource Strain: Not on file  Food Insecurity: Not on file  Transportation Needs: Not on file  Physical Activity: Not on file  Stress: Not on file  Social Connections: Not on file  Intimate Partner Violence: Not on file    Family History:  Family History  Problem Relation Age of Onset  . Heart disease Mother 447      CHF  . Crohn's disease Mother   . Asthma Mother   . Asthma Brother   . Heart disease Maternal Aunt   . Diabetes Maternal Aunt   . Stroke Maternal Aunt   . Heart disease Maternal Grandfather   . Stroke Maternal Grandfather     Medications Camey N. Alberson "[name]" had no medications administered during this visit. Current Outpatient Medications  Medication Sig Dispense Refill  . acetaminophen (TYLENOL) 325 MG tablet Take 2 tablets (650 mg total) by mouth every 4 (four) hours as needed (for pain scale < 4).    . cetirizine (ZYRTEC) 10 MG chewable tablet Chew 10 mg by mouth daily.    .Marland Kitchenibuprofen (ADVIL,MOTRIN) 600 MG tablet Take 1 tablet (600 mg total) by mouth every 6 (six) hours as needed for headache, mild pain or cramping. 30 tablet 0  . ferrous gluconate (FERGON) 324 MG tablet Take 1 tablet (324 mg  total) by mouth 2 (two) times daily with a meal. (Patient not taking: Reported on 04/21/2018) 60 tablet 0  . polyethylene glycol (MIRALAX / GLYCOLAX) packet Take 17 g by mouth daily. (Patient not taking: Reported on 11/23/2020) 30 each 0  . Prenatal Vit-Fe Fumarate-FA (MULTIVITAMIN-PRENATAL) 27-0.8 MG TABS tablet Take 1 tablet by mouth daily at 12 noon. (Patient not taking: Reported on 11/23/2020)    . ranitidine (ZANTAC) 150 MG tablet Take 1 tablet (150 mg total) by mouth 2 (two) times daily. (Patient not taking: Reported on 06/04/2018) 60 tablet 2  . simethicone (MYLICON) 80 MG chewable tablet Chew 1 tablet (80 mg total) by mouth 4 (four) times daily as  needed for flatulence. (Patient not taking: Reported on 06/04/2018) 30 tablet 0   No current facility-administered medications for this visit.    Allergies Patient has no known allergies.   Physical Exam:  BP (!) 152/98 (BP Location: Left Arm)   Pulse 93   Ht 5' 7"  (1.702 m)   Wt 190 lb 6.4 oz (86.4 kg)   LMP 11/02/2020 (Exact Date)   BMI 29.82 kg/m  Body mass index is 29.82 kg/m. General appearance: Well nourished, well developed female in no acute distress.  Cardiovascular: normal s1 and s2.  No murmurs, rubs or gallops. Respiratory:  Clear to auscultation bilateral. Normal respiratory effort Abdomen: positive bowel sounds and no masses, hernias; diffusely non tender to palpation, non distended Breasts: patient denies any breast s/s Neuro/Psych:  Normal mood and affect.  Skin:  Warm and dry.  Lymphatic:  No inguinal lymphadenopathy.   Pelvic exam: is not limited by body habitus EGBUS: within normal limits Vagina: within normal limits and with no blood or discharge in the vault Cervix: normal appearing cervix without tenderness, discharge or lesions. Uterus:  nonenlarged and non tender Adnexa:  normal adnexa and no mass, fullness, tenderness Rectovaginal: deferred  Laboratory: none  Radiology: none  Assessment: pt  stable  Plan:  1. Well female exam with routine gynecological exam Routine care - Cytology - PAP( [facility]) - CBC - HIV antibody (with reflex) - Hepatitis C Antibody - Hepatitis B Surface AntiGEN - RPR  2. Breast screening Pt states she had a mammogram within the last few weeks and it was normal  3. Menorrhagia with regular cycle I told her that her options are medicine and surgery. I told her that I would recommend depo provera, progestin only pills and mirena in that order and I recommend medicines over surgery given need for progrestin suppression therapy b/c of her endo. She state she is unaware about need for suppression but we did talk about this back in August 2020.  I also told her that she could do cyclic Lysteda but this would not suppress her endo.  For surgery, she has the option ablation but this also will not suppress her endo and may wear off. She has no climateric s/s.   Pt unsure of how she'd like to proceed and will consider options.  15 minutes of face to face time spent with patient regarding this issue and for her endometriosis - CBC  4. History of endometriosis  Orders Placed This Encounter  Procedures  . CBC  . HIV antibody (with reflex)  . Hepatitis C Antibody  . Hepatitis B Surface AntiGEN  . RPR    RTC PRN  Durene Romans MD Attending Center for [facility])

## 2020-11-24 LAB — HEPATITIS B SURFACE ANTIGEN: Hepatitis B Surface Ag: NEGATIVE

## 2020-11-24 LAB — CBC
Hematocrit: 37.5 % (ref 34.0–46.6)
Hemoglobin: 13.2 g/dL (ref 11.1–15.9)
MCH: 30.3 pg (ref 26.6–33.0)
MCHC: 35.2 g/dL (ref 31.5–35.7)
MCV: 86 fL (ref 79–97)
Platelets: 248 10*3/uL (ref 150–450)
RBC: 4.35 x10E6/uL (ref 3.77–5.28)
RDW: 12.7 % (ref 11.7–15.4)
WBC: 8.1 10*3/uL (ref 3.4–10.8)

## 2020-11-24 LAB — HEPATITIS C ANTIBODY: Hep C Virus Ab: <0.1 s/co ratio (ref 0.0–0.9)

## 2020-11-24 LAB — RPR: RPR Ser Ql: NONREACTIVE

## 2020-11-24 LAB — HIV ANTIBODY (ROUTINE TESTING W REFLEX): HIV Screen 4th Generation wRfx: NONREACTIVE

## 2020-11-28 LAB — CYTOLOGY - PAP
Comment: NEGATIVE
Comment: NEGATIVE
HPV 16: NEGATIVE
HPV 18 / 45: NEGATIVE
High risk HPV: POSITIVE — AB

## 2020-11-30 ENCOUNTER — Encounter: Payer: Self-pay | Admitting: Obstetrics and Gynecology

## 2020-11-30 DIAGNOSIS — N879 Dysplasia of cervix uteri, unspecified: Secondary | ICD-10-CM | POA: Insufficient documentation

## 2020-12-21 DIAGNOSIS — F331 Major depressive disorder, recurrent, moderate: Secondary | ICD-10-CM | POA: Diagnosis not present

## 2020-12-21 DIAGNOSIS — F900 Attention-deficit hyperactivity disorder, predominantly inattentive type: Secondary | ICD-10-CM | POA: Diagnosis not present

## 2021-01-04 DIAGNOSIS — F331 Major depressive disorder, recurrent, moderate: Secondary | ICD-10-CM | POA: Diagnosis not present

## 2021-01-04 DIAGNOSIS — F900 Attention-deficit hyperactivity disorder, predominantly inattentive type: Secondary | ICD-10-CM | POA: Diagnosis not present

## 2021-01-06 DIAGNOSIS — Z20822 Contact with and (suspected) exposure to covid-19: Secondary | ICD-10-CM | POA: Diagnosis not present

## 2021-02-09 DIAGNOSIS — F900 Attention-deficit hyperactivity disorder, predominantly inattentive type: Secondary | ICD-10-CM | POA: Diagnosis not present

## 2021-02-09 DIAGNOSIS — F331 Major depressive disorder, recurrent, moderate: Secondary | ICD-10-CM | POA: Diagnosis not present

## 2021-12-01 DIAGNOSIS — Z1231 Encounter for screening mammogram for malignant neoplasm of breast: Secondary | ICD-10-CM | POA: Diagnosis not present

## 2021-12-18 DIAGNOSIS — J309 Allergic rhinitis, unspecified: Secondary | ICD-10-CM | POA: Diagnosis not present

## 2021-12-18 DIAGNOSIS — I10 Essential (primary) hypertension: Secondary | ICD-10-CM | POA: Diagnosis not present

## 2021-12-23 DIAGNOSIS — F900 Attention-deficit hyperactivity disorder, predominantly inattentive type: Secondary | ICD-10-CM | POA: Diagnosis not present

## 2021-12-23 DIAGNOSIS — F331 Major depressive disorder, recurrent, moderate: Secondary | ICD-10-CM | POA: Diagnosis not present

## 2022-01-05 ENCOUNTER — Other Ambulatory Visit: Payer: Self-pay

## 2022-01-05 ENCOUNTER — Emergency Department (HOSPITAL_BASED_OUTPATIENT_CLINIC_OR_DEPARTMENT_OTHER)
Admission: EM | Admit: 2022-01-05 | Discharge: 2022-01-06 | Disposition: A | Discharge status: Home / Self Care | Payer: Medicaid Other | Attending: Emergency Medicine | Admitting: Emergency Medicine

## 2022-01-05 ENCOUNTER — Encounter (HOSPITAL_BASED_OUTPATIENT_CLINIC_OR_DEPARTMENT_OTHER): Payer: Self-pay | Admitting: *Deleted

## 2022-01-05 ENCOUNTER — Emergency Department (HOSPITAL_BASED_OUTPATIENT_CLINIC_OR_DEPARTMENT_OTHER): Payer: Medicaid Other

## 2022-01-05 DIAGNOSIS — R042 Hemoptysis: Secondary | ICD-10-CM

## 2022-01-05 DIAGNOSIS — J189 Pneumonia, unspecified organism: Secondary | ICD-10-CM

## 2022-01-05 DIAGNOSIS — R519 Headache, unspecified: Secondary | ICD-10-CM | POA: Diagnosis not present

## 2022-01-05 DIAGNOSIS — R7989 Other specified abnormal findings of blood chemistry: Secondary | ICD-10-CM | POA: Diagnosis not present

## 2022-01-05 DIAGNOSIS — R059 Cough, unspecified: Secondary | ICD-10-CM | POA: Diagnosis present

## 2022-01-05 DIAGNOSIS — J181 Lobar pneumonia, unspecified organism: Secondary | ICD-10-CM | POA: Insufficient documentation

## 2022-01-05 DIAGNOSIS — I10 Essential (primary) hypertension: Secondary | ICD-10-CM | POA: Diagnosis not present

## 2022-01-05 DIAGNOSIS — Z20822 Contact with and (suspected) exposure to covid-19: Secondary | ICD-10-CM | POA: Insufficient documentation

## 2022-01-05 DIAGNOSIS — J90 Pleural effusion, not elsewhere classified: Secondary | ICD-10-CM | POA: Diagnosis not present

## 2022-01-05 NOTE — ED Provider Notes (Signed)
?[facility] EMERGENCY DEPARTMENT ?Provider Note ? ? ?CSN: [id number] ?Arrival date & time: 01/05/22  2155 ? ?  ? ?History ? ?Chief Complaint  ?Patient presents with  ? Hemoptysis  ? ? ?Diane [name] is a 41 y.o. female. ? ?Patient complains of coughing up blood this evening starting around 7 PM suddenly.  She has not been sick recently.  She had a gradual onset headache earlier today and took some aspirin.  She has not been coughing or had a runny nose or sore throat.  When she coughs she saw some blood in her sputum.  This seemed to resolve and then she coughed several more times and had 2 more episodes of bloody sputum.  She denies any blood dyscrasias and does not take any blood thinners.  She does have a history of Crohn's disease and states her D-dimer is always positive.  She had some shortness of breath and pain when she coughs but this has resolved.  Denies any runny nose or sore throat currently.  She denies any difficulty breathing.  No abdominal pain or vomiting.  No fever.  Denies any other bleeding episodes or any black or bloody stools. ? ?The history is provided by the patient.  ? ?  ? ?Home Medications ?Prior to Admission medications   ?Medication Sig Start Date End Date Taking? Authorizing Provider  ?acetaminophen (TYLENOL) 325 MG tablet Take 2 tablets (650 mg total) by mouth every 4 (four) hours as needed (for pain scale < 4). 04/17/18   Aletha Halim, MD  ?cetirizine (ZYRTEC) 10 MG chewable tablet Chew 10 mg by mouth daily.    [provider]  ?ferrous gluconate (FERGON) 324 MG tablet Take 1 tablet (324 mg total) by mouth 2 (two) times daily with a meal. ?Patient not taking: Reported on 04/21/2018 04/17/18   Aletha Halim, MD  ?ibuprofen (ADVIL,MOTRIN) 600 MG tablet Take 1 tablet (600 mg total) by mouth every 6 (six) hours as needed for headache, mild pain or cramping. 04/21/18   Chancy Milroy, MD  ?polyethylene glycol (MIRALAX / GLYCOLAX) packet Take 17 g by mouth  daily. ?Patient not taking: Reported on 11/23/2020 04/18/18   Aletha Halim, MD  ?Prenatal Vit-Fe Fumarate-FA (MULTIVITAMIN-PRENATAL) 27-0.8 MG TABS tablet Take 1 tablet by mouth daily at 12 noon. ?Patient not taking: Reported on 11/23/2020    [provider]  ?ranitidine (ZANTAC) 150 MG tablet Take 1 tablet (150 mg total) by mouth 2 (two) times daily. ?Patient not taking: Reported on 06/04/2018 03/05/18   Aletha Halim, MD  ?simethicone (MYLICON) 80 MG chewable tablet Chew 1 tablet (80 mg total) by mouth 4 (four) times daily as needed for flatulence. ?Patient not taking: Reported on 06/04/2018 04/17/18   Aletha Halim, MD  ?   ? ?Allergies    ?Patient has no known allergies.   ? ?Review of Systems   ?Review of Systems  ?Constitutional:  Negative for activity change, appetite change and fever.  ?HENT:  Positive for congestion and sore throat.   ?Respiratory:  Negative for cough, chest tightness, shortness of breath and wheezing.   ?Cardiovascular:  Negative for leg swelling.  ?Gastrointestinal:  Negative for abdominal pain, nausea and vomiting.  ?Genitourinary:  Negative for dysuria, hematuria and vaginal discharge.  ?Musculoskeletal:  Negative for arthralgias and myalgias.  ?Neurological:  Negative for dizziness, weakness and headaches.  ? all other systems are negative except as noted in the HPI and PMH.  ? ?Physical Exam ?Updated Vital Signs ?BP Marland Kitchen)  165/105 (BP Location: Right Arm)   Pulse 94   Temp 98.3 ?F (36.8 ?C) (Oral)   Resp 18   SpO2 100%  ?Physical Exam ?Vitals and nursing note reviewed.  ?Constitutional:   ?   General: She is not in acute distress. ?   Appearance: She is well-developed.  ?HENT:  ?   Head: Normocephalic and atraumatic.  ?   Mouth/Throat:  ?   Pharynx: No oropharyngeal exudate.  ?   Comments: OP is clear ?Eyes:  ?   Conjunctiva/sclera: Conjunctivae normal.  ?   Pupils: Pupils are equal, round, and reactive to light.  ?Neck:  ?   Comments: No meningismus. ?Cardiovascular:  ?    Rate and Rhythm: Normal rate and regular rhythm.  ?   Heart sounds: Normal heart sounds. No murmur heard. ?Pulmonary:  ?   Effort: Pulmonary effort is normal. No respiratory distress.  ?   Breath sounds: Normal breath sounds.  ?Chest:  ?   Chest wall: No tenderness.  ?Abdominal:  ?   Palpations: Abdomen is soft.  ?   Tenderness: There is no abdominal tenderness. There is no guarding or rebound.  ?Musculoskeletal:     ?   General: No tenderness. Normal range of motion.  ?   Cervical back: Normal range of motion and neck supple.  ?Skin: ?   General: Skin is warm.  ?Neurological:  ?   Mental Status: She is alert and oriented to person, place, and time.  ?   Cranial Nerves: No cranial nerve deficit.  ?   Motor: No abnormal muscle tone.  ?   Coordination: Coordination normal.  ?   Comments:  5/5 strength throughout. CN 2-12 intact.Equal grip strength.   ?Psychiatric:     ?   Behavior: Behavior normal.  ? ? ?ED Results / Procedures / Treatments   ?Labs ?(all labs ordered are listed, but only abnormal results are displayed) ?Labs Reviewed  ?BASIC METABOLIC PANEL - Abnormal; Notable for the following components:  ?    Result Value  ? Potassium 3.1 (*)   ? Calcium 8.2 (*)   ? All other components within normal limits  ?D-DIMER, QUANTITATIVE - Abnormal; Notable for the following components:  ? D-Dimer, Quant 0.61 (*)   ? All other components within normal limits  ?RESP PANEL BY RT-PCR (FLU A&B, COVID) [id number]  ?CBC WITH DIFFERENTIAL/PLATELET  ?TROPONIN I (HIGH SENSITIVITY)  ?TROPONIN I (HIGH SENSITIVITY)  ? ? ?EKG ?None ? ?Radiology ?DG Chest 2 View ? ?Result Date: 01/05/2022 ?CLINICAL DATA:  , Assess, headache, hypertension EXAM: CHEST - 2 VIEW COMPARISON:  06/10/2017 FINDINGS: The heart size and mediastinal contours are within normal limits. Both lungs are clear. The visualized skeletal structures are unremarkable. IMPRESSION: No active cardiopulmonary disease. Electronically Signed   By: Randa Ngo M.D.   On: 01/05/2022  23:27  ? ?CT Angio Chest PE W and/or Wo Contrast ? ?Result Date: 01/06/2022 ?CLINICAL DATA:  Hemoptysis with positive D-dimer. EXAM: CT ANGIOGRAPHY CHEST WITH CONTRAST TECHNIQUE: Multidetector CT imaging of the chest was performed using the standard protocol during bolus administration of intravenous contrast. Multiplanar CT image reconstructions and MIPs were obtained to evaluate the vascular anatomy. RADIATION DOSE REDUCTION: This exam was performed according to the departmental dose-optimization program which includes automated exposure control, adjustment of the mA and/or kV according to patient size and/or use of iterative reconstruction technique. CONTRAST:  127m OMNIPAQUE IOHEXOL 350 MG/ML SOLN COMPARISON:  CT chest 03/12/2006 FINDINGS: Cardiovascular: Satisfactory opacification  of the pulmonary arteries to the segmental level. No evidence of pulmonary embolism. Normal heart size. No pericardial effusion. Mediastinum/Nodes: No enlarged mediastinal, hilar, or axillary lymph nodes. Thyroid gland, trachea, and esophagus demonstrate no significant findings. Lungs/Pleura: There is a trace right pleural effusion. There is some patchy and confluent ground-glass opacities in the right middle lobe. Lungs are otherwise clear. No pneumothorax. Upper Abdomen: No acute abnormality. Musculoskeletal: No chest wall abnormality. No acute or significant osseous findings. Review of the MIP images confirms the above findings. IMPRESSION: 1. No evidence for pulmonary embolism. 2. Right middle lobe ground-glass opacities, likely infectious/inflammatory. 3. Trace right pleural effusion. Electronically Signed   By: Ronney Asters M.D.   On: 01/06/2022 02:02   ? ?Procedures ?Procedures  ? ? ?Medications Ordered in ED ?Medications - No data to display ? ?ED Course/ Medical Decision Making/ A&P ?  ?                        ? ?Hemoptysis x3 this evening without prodrome.  Vital stable, no distress.  No chest pain or shortness of  breath. ? ?Chest x-ray shows no masses or pneumonia.  Results reviewed and interpreted by me ? ?D-dimer slightly positive.  CT scan is obtained to rule out pulmonary embolism.  Results reviewed by me.  There is no eviden

## 2022-01-05 NOTE — ED Triage Notes (Signed)
Pt reports she has coughed up blood this evening x 3 episodes. Controlled at present. States she felt fine today prior to it happening ?

## 2022-01-06 ENCOUNTER — Emergency Department (HOSPITAL_BASED_OUTPATIENT_CLINIC_OR_DEPARTMENT_OTHER): Payer: Medicaid Other

## 2022-01-06 DIAGNOSIS — R042 Hemoptysis: Secondary | ICD-10-CM | POA: Diagnosis not present

## 2022-01-06 DIAGNOSIS — R7989 Other specified abnormal findings of blood chemistry: Secondary | ICD-10-CM | POA: Diagnosis not present

## 2022-01-06 DIAGNOSIS — J90 Pleural effusion, not elsewhere classified: Secondary | ICD-10-CM | POA: Diagnosis not present

## 2022-01-06 LAB — BASIC METABOLIC PANEL
Anion gap: 6 (ref 5–15)
BUN: 19 mg/dL (ref 6–20)
CO2: 25 mmol/L (ref 22–32)
Calcium: 8.2 mg/dL — ABNORMAL LOW (ref 8.9–10.3)
Chloride: 107 mmol/L (ref 98–111)
Creatinine, Ser: 0.66 mg/dL (ref 0.44–1.00)
GFR, Estimated: >60 mL/min (ref >60)
Glucose, Bld: 98 mg/dL (ref 70–99)
Potassium: 3.1 mmol/L — ABNORMAL LOW (ref 3.5–5.1)
Sodium: 138 mmol/L (ref 135–145)

## 2022-01-06 LAB — CBC WITH DIFFERENTIAL/PLATELET
Abs Immature Granulocytes: 0.03 10*3/uL (ref 0.00–0.07)
Basophils Absolute: 0 10*3/uL (ref 0.0–0.1)
Basophils Relative: 0 %
Eosinophils Absolute: 0.3 10*3/uL (ref 0.0–0.5)
Eosinophils Relative: 3 %
HCT: 37 % (ref 36.0–46.0)
Hemoglobin: 12.7 g/dL (ref 12.0–15.0)
Immature Granulocytes: 0 %
Lymphocytes Relative: 24 %
Lymphs Abs: 2.2 10*3/uL (ref 0.7–4.0)
MCH: 29.7 pg (ref 26.0–34.0)
MCHC: 34.3 g/dL (ref 30.0–36.0)
MCV: 86.7 fL (ref 80.0–100.0)
Monocytes Absolute: 0.4 10*3/uL (ref 0.1–1.0)
Monocytes Relative: 5 %
Neutro Abs: 6.2 10*3/uL (ref 1.7–7.7)
Neutrophils Relative %: 68 %
Platelets: 250 10*3/uL (ref 150–400)
RBC: 4.27 MIL/uL (ref 3.87–5.11)
RDW: 12.9 % (ref 11.5–15.5)
WBC: 9.2 10*3/uL (ref 4.0–10.5)
nRBC: 0 % (ref 0.0–0.2)

## 2022-01-06 LAB — D-DIMER, QUANTITATIVE: D-Dimer, Quant: 0.61 ug/mL-FEU — ABNORMAL HIGH (ref 0.00–0.50)

## 2022-01-06 LAB — RESP PANEL BY RT-PCR (FLU A&B, COVID) ARPGX2
Influenza A by PCR: NEGATIVE
Influenza B by PCR: NEGATIVE
SARS Coronavirus 2 by RT PCR: NEGATIVE

## 2022-01-06 LAB — TROPONIN I (HIGH SENSITIVITY)
Troponin I (High Sensitivity): 5 ng/L (ref <18)
Troponin I (High Sensitivity): 6 ng/L (ref <18)

## 2022-01-06 MED ORDER — DOXYCYCLINE HYCLATE 100 MG PO TABS
100.0000 mg | ORAL_TABLET | Freq: Once | ORAL | Status: AC
Start: 2022-01-06 — End: 2022-01-06
  Administered 2022-01-06: 100 mg via ORAL
  Filled 2022-01-06: qty 1

## 2022-01-06 MED ORDER — FLUCONAZOLE 150 MG PO TABS: 150.0000 mg | ORAL_TABLET | Freq: Every day | ORAL | 0 refills | Status: DC

## 2022-01-06 MED ORDER — FLUCONAZOLE 150 MG PO TABS: 150.0000 mg | ORAL_TABLET | Freq: Once | ORAL | Status: DC

## 2022-01-06 MED ORDER — IOHEXOL 350 MG/ML SOLN
100.0000 mL | Freq: Once | INTRAVENOUS | Status: AC | PRN
  Administered 2022-01-06: 100 mL via INTRAVENOUS

## 2022-01-06 MED ORDER — ACETAMINOPHEN 325 MG PO TABS
650.0000 mg | ORAL_TABLET | Freq: Once | ORAL | Status: AC
  Administered 2022-01-06: 650 mg via ORAL
  Filled 2022-01-06: qty 2

## 2022-01-06 MED ORDER — DOXYCYCLINE HYCLATE 100 MG PO CAPS: 100.0000 mg | ORAL_CAPSULE | Freq: Two times a day (BID) | ORAL | 0 refills | Status: DC

## 2022-01-06 NOTE — Discharge Instructions (Signed)
Take the antibiotic as prescribed for pneumonia.  Follow-up your COVID results online tomorrow.  Return to the ED with difficulty breathing, chest pain, worsening coughing up blood or any other concerns ?

## 2022-01-17 ENCOUNTER — Ambulatory Visit: Payer: BC Managed Care – PPO | Admitting: Obstetrics and Gynecology

## 2022-08-02 DIAGNOSIS — F900 Attention-deficit hyperactivity disorder, predominantly inattentive type: Secondary | ICD-10-CM | POA: Diagnosis not present

## 2022-08-02 DIAGNOSIS — F331 Major depressive disorder, recurrent, moderate: Secondary | ICD-10-CM | POA: Diagnosis not present

## 2022-08-11 ENCOUNTER — Telehealth: Payer: Self-pay

## 2022-08-11 ENCOUNTER — Ambulatory Visit
Admission: RE | Admit: 2022-08-11 | Discharge: 2022-08-11 | Disposition: A | Discharge status: Home / Self Care | Payer: Medicaid Other | Source: Ambulatory Visit

## 2022-08-11 VITALS — BP 147/93 | HR 94 | Temp 99.4°F | Resp 18

## 2022-08-11 DIAGNOSIS — J019 Acute sinusitis, unspecified: Secondary | ICD-10-CM | POA: Diagnosis not present

## 2022-08-11 DIAGNOSIS — B9689 Other specified bacterial agents as the cause of diseases classified elsewhere: Secondary | ICD-10-CM

## 2022-08-11 DIAGNOSIS — J302 Other seasonal allergic rhinitis: Secondary | ICD-10-CM | POA: Diagnosis not present

## 2022-08-11 DIAGNOSIS — H6692 Otitis media, unspecified, left ear: Secondary | ICD-10-CM

## 2022-08-11 MED ORDER — CETIRIZINE HCL 10 MG PO TABS: 10.0000 mg | ORAL_TABLET | Freq: Two times a day (BID) | ORAL | 1 refills | Status: DC

## 2022-08-11 MED ORDER — FLUCONAZOLE 150 MG PO TABS: ORAL_TABLET | ORAL | 0 refills | Status: DC

## 2022-08-11 MED ORDER — FEXOFENADINE HCL 180 MG PO TABS: 180.0000 mg | ORAL_TABLET | Freq: Every day | ORAL | 1 refills | Status: DC

## 2022-08-11 MED ORDER — IBUPROFEN 400 MG PO TABS: 400.0000 mg | ORAL_TABLET | Freq: Three times a day (TID) | ORAL | 0 refills | Status: DC | PRN

## 2022-08-11 MED ORDER — CEFDINIR 300 MG PO CAPS: 300.0000 mg | ORAL_CAPSULE | Freq: Two times a day (BID) | ORAL | 0 refills | Status: DC

## 2022-08-11 MED ORDER — CEFDINIR 300 MG PO CAPS: 300.0000 mg | ORAL_CAPSULE | Freq: Two times a day (BID) | ORAL | 0 refills | Status: AC

## 2022-08-11 MED ORDER — BECLOMETHASONE DIPROPIONATE 80 MCG/ACT NA AERS: 2.0000 | INHALATION_SPRAY | Freq: Every morning | NASAL | 3 refills | Status: DC

## 2022-08-11 MED ORDER — METHYLPREDNISOLONE SODIUM SUCC 125 MG IJ SOLR
80.0000 mg | Freq: Once | INTRAMUSCULAR | Status: AC
Start: 2022-08-11 — End: 2022-08-11
  Administered 2022-08-11: 80 mg via INTRAMUSCULAR

## 2022-08-11 NOTE — Discharge Instructions (Signed)
Please read below to learn more about the medications, dosages and frequencies that I recommend to help alleviate your symptoms and to get you feeling better soon:  Omnicef (cefdinir):  1 capsule twice daily for 10 days, you can take it with or without food.  This antibiotic can cause upset stomach, this will resolve once antibiotics are complete.  You are welcome to use a probiotic, eat yogurt, take Imodium while taking this medication.  Please avoid other systemic medications such as Maalox, Pepto-Bismol or milk of magnesia as they can interfere with your body's ability to absorb the antibiotics.       Diflucan (fluconazole): As you may or may not be aware, taking antibiotics can often cause patients to develop a vaginal yeast infection.  For this reason, I have provided you with a prescription for Diflucan, and antifungal medication used to treat vaginal yeast infections.  Please take the first Diflucan tablet on day 3 or 4 of your antibiotic therapy, and take the second Diflucan tablet 3 days later.  You do not need to pick up this prescription or take this medication unless you develop symptoms of vaginal yeast infection including thick, white vaginal discharge and/or vaginal itching.  This prescription has been provided as a Manufacturing engineer and for your convenience.   Zyrtec (cetirizine): This is an excellent second-generation antihistamine that helps to reduce respiratory inflammatory response to environmental allergens.  I provided you with twice daily dosing.    Allegra (fexofenadine): This is an excellent second-generation antihistamine that helps to reduce respiratory inflammatory response to environmental allergens.  This medication should really only be taken once a day.  Advil, Motrin (ibuprofen): This is a good anti-inflammatory medication which not only addresses aches, pains but also significantly reduces soft tissue inflammation of the upper airways that causes sinus and nasal congestion as well  as inflammation of the lower airways which makes you feel like your breathing is constricted or your cough feel tight.  I recommend that you take 400 mg every 8 hours as needed.     I refilled your Qnasl as well.  Please let us know if your symptoms have not meaningfully improved after 10 days of treatment.  If you find that your insurance will not pay for any of the above medications, please feel free to download the GoodRx app for discounts of the off-the-shelf price.

## 2022-08-11 NOTE — ED Triage Notes (Signed)
Reports headache, facial, pressure, and nasal congestion Onset Monday . Denies fever, cough, and body aches.  Tried Tylenol sinus with some relief. Tried Vicks nasal spray with some nasal relief.   Onset Monday

## 2022-08-11 NOTE — ED Provider Notes (Signed)
[facility]    CSN: [id number] Arrival date & time: 08/11/22  1021    HISTORY   Chief Complaint  Patient presents with   Nasal Congestion    Entered by patient   HPI Diane [name] is a pleasant, 41 y.o. female who presents to urgent care today. Patient reports a 5-day history of worsening sinus headache, sinus pressure and nasal congestion, worse on the left than the right.  Patient states this morning she also noticed that the left side of her face below her eyes seems to be swollen.  Patient states she has not had any fever, cough or body ache.  Patient reports a history of uncontrolled allergies, states she takes Allegra and Zyrtec both twice a day.  Patient states she also occasionally uses Qnasl but has not been taking it regularly.  Patient states she took Tylenol Sinus which gave her almost immediate but temporary relief of pain and pressure.  Patient states she has been trying to blow her nose but nothing really comes out.  Patient states she also tried Vicks nasal spray also with temporary relief.  The history is provided by the patient.   Past Medical History:  Diagnosis Date   ADD (attention deficit disorder)    Anemia    Anxiety    Crohn's disease ([place])    Fibroid    H/O blood clots    tested positive but not located on doppler    Headache(784.0)    otc meds - last migraine 2009   Heart murmur    as child, never had any problems   History of endometriosis    Leiomyoma 02/09/2015   Low blood potassium    history   Migraine without aura, with intractable migraine, so stated, without mention of status migrainosus 06/19/2013   Pneumonia    history back in 2006   PONV (postoperative nausea and vomiting)    Seasonal allergies    Vaginal Pap smear, abnormal    Vasovagal syncope    Patient Active Problem List   Diagnosis Date Noted   Cervical dysplasia 11/30/2020   Dysmenorrhea 05/19/2019   H/O myomectomy 01/21/2018   Low back pain radiating to left  leg 10/04/2015   Intractable migraine without aura 06/19/2013   Vertigo 06/13/2013   Headache 06/13/2013   History of endometriosis 06/13/2013   CROHN'S DISEASE-LARGE & SMALL INTESTINE 07/27/2009   GASTROESOPHAGEAL REFLUX DISEASE, MILD 07/12/2008   Past Surgical History:  Procedure Laterality Date   ADENOIDECTOMY     CESAREAN SECTION N/A 04/14/2018   Procedure: PRIMARY CESAREAN SECTION;  Surgeon: Jonnie Kind, MD;  Location: [facility];  Service: Obstetrics;  Laterality: N/A;   DILATATION & CURRETTAGE/HYSTEROSCOPY WITH RESECTOCOPE N/A 06/09/2013   Procedure: DILATATION & CURETTAGE/HYSTEROSCOPY WITH HYSTEROSCOPIC RESECTION OF SUBMUCOSAL FIBROID AND ENDOMETRIAL POLYP;  Surgeon: Marvene Staff, MD;  Location: [facility];  Service: Gynecology;  Laterality: N/A;   LAPAROSCOPY N/A 06/09/2013   Procedure: LAPAROSCOPY DIAGNOSTIC;  Surgeon: Marvene Staff, MD;  Location: [facility];  Service: Gynecology;  Laterality: N/A;   ROBOT ASSISTED MYOMECTOMY N/A 02/09/2015   Procedure: MYOMECTOMY,EXCISION OF ENDOMETRIOSIS,PRE-SACRAL NEURECTOMY;  Surgeon: Governor Specking, MD;  Location: [facility];  Service: Gynecology;  Laterality: N/A;   ROBOTIC ASSISTED LAPAROSCOPIC LYSIS OF ADHESION N/A 06/09/2013   Procedure: ROBOTIC ASSISTED LAPAROSCOPIC LYSIS OF ADHESION; Resection of Endometriosis and excision of fibroid;  Surgeon: Marvene Staff, MD;  Location: [facility];  Service: Gynecology;  Laterality: N/A;   TONSILLECTOMY  TYMPANOSTOMY TUBE PLACEMENT     OB History     Gravida  3   Para  3   Term  2   Preterm  1   AB      Living  3      SAB      IAB      Ectopic      Multiple  0   Live Births  3          Home Medications    Prior to Admission medications   Medication Sig Start Date End Date Taking? Authorizing Provider  amphetamine-dextroamphetamine (ADDERALL) 30 MG tablet Take 30 mg by mouth daily.   Yes [provider]  Beclomethasone Dipropionate 80  MCG/ACT AERS Place 2 sprays into the nose in the morning. 08/11/22 09/10/22 Yes Lynden Oxford Scales, PA-C  cefdinir (OMNICEF) 300 MG capsule Take 1 capsule (300 mg total) by mouth 2 (two) times daily for 10 days. 08/11/22 08/21/22 Yes Lynden Oxford Scales, PA-C  cetirizine (ZYRTEC ALLERGY) 10 MG tablet Take 1 tablet (10 mg total) by mouth 2 (two) times daily. 08/11/22 02/07/23 Yes Lynden Oxford Scales, PA-C  fexofenadine (ALLEGRA) 180 MG tablet Take 1 tablet (180 mg total) by mouth daily. 08/11/22 02/07/23 Yes Lynden Oxford Scales, PA-C  fluconazole (DIFLUCAN) 150 MG tablet Take 1 tablet on day 4 of antibiotics.  Take second tablet 3 days later. 08/11/22  Yes Lynden Oxford Scales, PA-C  ibuprofen (ADVIL) 400 MG tablet Take 1 tablet (400 mg total) by mouth every 8 (eight) hours as needed for up to 30 doses. 08/11/22  Yes Lynden Oxford Scales, PA-C  acetaminophen (TYLENOL) 325 MG tablet Take 2 tablets (650 mg total) by mouth every 4 (four) hours as needed (for pain scale < 4). 04/17/18   Aletha Halim, MD    Family History Family History  Problem Relation Age of Onset   Heart disease Mother 19       CHF   Crohn's disease Mother    Asthma Mother    Asthma Brother    Heart disease Maternal Aunt    Diabetes Maternal Aunt    Stroke Maternal Aunt    Heart disease Maternal Grandfather    Stroke Maternal Grandfather    Social History Social History   Tobacco Use   Smoking status: Never   Smokeless tobacco: Never  Vaping Use   Vaping Use: Never used  Substance Use Topics   Alcohol use: No   Drug use: No   Allergies   Patient has no known allergies.  Review of Systems Review of Systems Pertinent findings revealed after performing a 14 point review of systems has been noted in the history of present illness.  Physical Exam Triage Vital Signs ED Triage Vitals  Enc Vitals Group     BP 07/21/21 0827 (!) 147/82     Pulse Rate 07/21/21 0827 72     Resp 07/21/21 0827 18      Temp 07/21/21 0827 98.3 F (36.8 C)     Temp Source 07/21/21 0827 Oral     SpO2 07/21/21 0827 98 %     Weight --      Height --      Head Circumference --      Peak Flow --      Pain Score 07/21/21 0826 5     Pain Loc --      Pain Edu? --      Excl. in GC? --   No  data found.  Updated Vital Signs BP (!) 147/93 (BP Location: Left Arm)   Pulse 94   Temp 99.4 F (37.4 C) (Oral)   Resp 18   LMP 07/25/2022   SpO2 97%   Physical Exam Vitals and nursing note reviewed.  Constitutional:      General: She is not in acute distress.    Appearance: Normal appearance. She is not ill-appearing.  HENT:     Head: Normocephalic and atraumatic.     Salivary Glands: Right salivary gland is not diffusely enlarged or tender. Left salivary gland is not diffusely enlarged or tender.     Right Ear: Hearing, ear canal and external ear normal. No drainage. A middle ear effusion is present. There is no impacted cerumen. Tympanic membrane is bulging. Tympanic membrane is not injected or erythematous.     Left Ear: Hearing, ear canal and external ear normal. No drainage.  No middle ear effusion. There is no impacted cerumen. Tympanic membrane is bulging. Tympanic membrane is not injected or erythematous.     Ears:     Comments: Bilateral EACs normal, both TMs bulging with clear fluid    Nose: Rhinorrhea present. No nasal deformity, septal deviation, signs of injury, nasal tenderness, mucosal edema or congestion. Rhinorrhea is clear.     Right Nostril: Occlusion present. No foreign body, epistaxis or septal hematoma.     Left Nostril: Occlusion present. No foreign body, epistaxis or septal hematoma.     Right Turbinates: Enlarged, swollen and pale.     Left Turbinates: Enlarged, swollen and pale.     Right Sinus: Maxillary sinus tenderness and frontal sinus tenderness present.     Left Sinus: Maxillary sinus tenderness and frontal sinus tenderness present.     Mouth/Throat:     Lips: Pink. No lesions.      Mouth: Mucous membranes are moist. No oral lesions.     Tongue: No lesions. Tongue does not deviate from midline.     Palate: No mass and lesions.     Pharynx: Oropharynx is clear. Uvula midline. No pharyngeal swelling, oropharyngeal exudate, posterior oropharyngeal erythema or uvula swelling.     Tonsils: No tonsillar exudate. 0 on the right. 0 on the left.     Comments: Postnasal drip Eyes:     General: Lids are normal.        Right eye: No discharge.        Left eye: No discharge.     Extraocular Movements: Extraocular movements intact.     Conjunctiva/sclera: Conjunctivae normal.     Right eye: Right conjunctiva is not injected. No chemosis or exudate.    Left eye: Left conjunctiva is not injected. No chemosis or exudate.    Pupils: Pupils are equal, round, and reactive to light.  Neck:     Trachea: Trachea and phonation normal.  Cardiovascular:     Rate and Rhythm: Normal rate and regular rhythm.     Pulses: Normal pulses.     Heart sounds: Normal heart sounds. No murmur heard.    No friction rub. No gallop.  Pulmonary:     Effort: Pulmonary effort is normal. No accessory muscle usage, prolonged expiration or respiratory distress.     Breath sounds: Normal breath sounds. No stridor, decreased air movement or transmitted upper airway sounds. No decreased breath sounds, wheezing, rhonchi or rales.  Chest:     Chest wall: No tenderness.  Musculoskeletal:        General: Normal range of motion.  Cervical back: Full passive range of motion without pain, normal range of motion and neck supple. Normal range of motion.  Lymphadenopathy:     Cervical: Cervical adenopathy present.     Right cervical: Superficial cervical adenopathy present.     Left cervical: Superficial cervical adenopathy present.  Skin:    General: Skin is warm and dry.     Findings: No erythema or rash.  Neurological:     General: No focal deficit present.     Mental Status: She is alert and oriented to  person, place, and time.  Psychiatric:        Mood and Affect: Mood normal.        Behavior: Behavior normal.     Visual Acuity Right Eye Distance:   Left Eye Distance:   Bilateral Distance:    Right Eye Near:   Left Eye Near:    Bilateral Near:     [facility] Couse / Diagnostics / Procedures:     Radiology No results found.  Procedures Procedures (including critical care time) EKG  Pending results:  Labs Reviewed - No data to display  Medications Ordered in UC: Medications  methylPREDNISolone sodium succinate (SOLU-MEDROL) 125 mg/2 mL injection 80 mg (80 mg Intramuscular Given 08/11/22 1235)    UC Diagnoses / Final Clinical Impressions(s)   I have reviewed the triage vital signs and the nursing notes.  Pertinent labs & imaging results that were available during my care of the patient were reviewed by me and considered in my medical decision making (see chart for details).    Final diagnoses:  Acute bacterial sinusitis  Left otitis media, unspecified otitis media type  Seasonal allergic rhinitis, unspecified trigger   Patient advised that due to duration of symptoms she likely needs to be treated with antibiotics for bacterial sinusitis which is beginning to introduce suppurative fluid into her right inner ear as well.  Cefdinir twice daily x10 days sent to pharmacy along with a prophylactic prescription for Diflucan for antibiotic induced vaginal yeast infection.   Patient provided with renewals of Zyrtec twice daily and Allegra but advised to take Allegra only  once a day as this is not indicated twice daily.    Patient provided with renewal of her Qnasl and ibuprofen to help reduce nasal inflammation and promote drainage.  ED Prescriptions     Medication Sig Dispense Auth. Provider   cefdinir (OMNICEF) 300 MG capsule Take 1 capsule (300 mg total) by mouth 2 (two) times daily for 10 days. 20 capsule Lynden Oxford Scales, PA-C   Beclomethasone Dipropionate 80 MCG/ACT  AERS Place 2 sprays into the nose in the morning. 10.6 g Lynden Oxford Scales, PA-C   cetirizine (ZYRTEC ALLERGY) 10 MG tablet Take 1 tablet (10 mg total) by mouth 2 (two) times daily. 180 tablet Lynden Oxford Scales, PA-C   fexofenadine (ALLEGRA) 180 MG tablet Take 1 tablet (180 mg total) by mouth daily. 90 tablet Lynden Oxford Scales, PA-C   fluconazole (DIFLUCAN) 150 MG tablet Take 1 tablet on day 4 of antibiotics.  Take second tablet 3 days later. 2 tablet Lynden Oxford Scales, PA-C   ibuprofen (ADVIL) 400 MG tablet Take 1 tablet (400 mg total) by mouth every 8 (eight) hours as needed for up to 30 doses. 30 tablet Lynden Oxford Scales, PA-C      PDMP not reviewed this encounter.  Disposition Upon Discharge:  Condition: stable for discharge home Home: take medications as prescribed; routine discharge instructions as discussed; follow up  as advised.  Patient presented with an acute illness with associated systemic symptoms and significant discomfort requiring urgent management. In my opinion, this is a condition that a prudent lay person (someone who possesses an average knowledge of health and medicine) may potentially expect to result in complications if not addressed urgently such as respiratory distress, impairment of bodily function or dysfunction of bodily organs.   Routine symptom specific, illness specific and/or disease specific instructions were discussed with the patient and/or caregiver at length.   As such, the patient has been evaluated and assessed, work-up was performed and treatment was provided in alignment with urgent care protocols and evidence based medicine.  Patient/parent/caregiver has been advised that the patient may require follow up for further testing and treatment if the symptoms continue in spite of treatment, as clinically indicated and appropriate.  If the patient was tested for COVID-19, Influenza and/or RSV, then the patient/parent/guardian was  advised to isolate at home pending the results of his/her diagnostic coronavirus test and potentially longer if they're positive. I have also advised pt that if his/her COVID-19 test returns positive, it's recommended to self-isolate for at least 10 days after symptoms first appeared AND until fever-free for 24 hours without fever reducer AND other symptoms have improved or resolved. Discussed self-isolation recommendations as well as instructions for household member/close contacts as per the [facility] and [place] [facility], and also gave patient the [place] packet with this information.  Patient/parent/caregiver has been advised to return to the [facility] or PCP in 3-5 days if no better; to PCP or the Emergency Department if new signs and symptoms develop, or if the current signs or symptoms continue to change or worsen for further workup, evaluation and treatment as clinically indicated and appropriate  The patient will follow up with their current PCP if and as advised. If the patient does not currently have a PCP we will assist them in obtaining one.   The patient may need specialty follow up if the symptoms continue, in spite of conservative treatment and management, for further workup, evaluation, consultation and treatment as clinically indicated and appropriate.  Patient/parent/caregiver verbalized understanding and agreement of plan as discussed.  All questions were addressed during visit.  Please see discharge instructions below for further details of plan.  Discharge Instructions:   Discharge Instructions      Please read below to learn more about the medications, dosages and frequencies that I recommend to help alleviate your symptoms and to get you feeling better soon:  Omnicef (cefdinir):  1 capsule twice daily for 10 days, you can take it with or without food.  This antibiotic can cause upset stomach, this will resolve once antibiotics are complete.  You are welcome to use a probiotic, eat yogurt, take  Imodium while taking this medication.  Please avoid other systemic medications such as Maalox, Pepto-Bismol or milk of magnesia as they can interfere with your body's ability to absorb the antibiotics.       Diflucan (fluconazole): As you may or may not be aware, taking antibiotics can often cause patients to develop a vaginal yeast infection.  For this reason, I have provided you with a prescription for Diflucan, and antifungal medication used to treat vaginal yeast infections.  Please take the first Diflucan tablet on day 3 or 4 of your antibiotic therapy, and take the second Diflucan tablet 3 days later.  You do not need to pick up this prescription or take this medication unless you develop symptoms of vaginal  yeast infection including thick, white vaginal discharge and/or vaginal itching.  This prescription has been provided as a Manufacturing engineer and for your convenience.   Zyrtec (cetirizine): This is an excellent second-generation antihistamine that helps to reduce respiratory inflammatory response to environmental allergens.  I provided you with twice daily dosing.    Allegra (fexofenadine): This is an excellent second-generation antihistamine that helps to reduce respiratory inflammatory response to environmental allergens.  This medication should really only be taken once a day.  Advil, Motrin (ibuprofen): This is a good anti-inflammatory medication which not only addresses aches, pains but also significantly reduces soft tissue inflammation of the upper airways that causes sinus and nasal congestion as well as inflammation of the lower airways which makes you feel like your breathing is constricted or your cough feel tight.  I recommend that you take 400 mg every 8 hours as needed.     I refilled your Qnasl as well.  Please let us know if your symptoms have not meaningfully improved after 10 days of treatment.  If you find that your insurance will not pay for any of the above medications, please  feel free to download the GoodRx app for discounts of the off-the-shelf price.        This office note has been dictated using Museum/gallery curator.  Unfortunately, this method of dictation can sometimes lead to typographical or grammatical errors.  I apologize for your inconvenience in advance if this occurs.  Please do not hesitate to reach out to me if clarification is needed.      Lynden Oxford Scales, PA-C 08/11/22 1256

## 2022-08-29 ENCOUNTER — Ambulatory Visit
Admission: EM | Admit: 2022-08-29 | Discharge: 2022-08-29 | Disposition: A | Discharge status: Home / Self Care | Payer: Medicaid Other | Attending: Emergency Medicine | Admitting: Emergency Medicine

## 2022-08-29 DIAGNOSIS — M5442 Lumbago with sciatica, left side: Secondary | ICD-10-CM

## 2022-08-29 MED ORDER — NAPROXEN 500 MG PO TABS: 500.0000 mg | ORAL_TABLET | Freq: Two times a day (BID) | ORAL | 0 refills | Status: DC

## 2022-08-29 MED ORDER — BACLOFEN 10 MG PO TABS: 10.0000 mg | ORAL_TABLET | Freq: Every day | ORAL | 0 refills | Status: AC

## 2022-08-29 NOTE — Discharge Instructions (Signed)
Please go to [facility] at [facility] to have x-ray of her lumbar spine performed.  You will be contacted with the results once the radiology report is complete.  I sent a prescription to your pharmacy for baclofen which is a muscle relaxer that can be taken nightly before bed for the next 7 days.  I also sent a prescription for naproxen 500 mg that you can take twice daily for pain.  Please follow-up with your primary care provider regarding the results of your x-ray of your lumbar spine and how you are doing on naproxen and the baclofen.  Thank you for returning to see [place] here urgent care today.

## 2022-08-29 NOTE — ED Triage Notes (Signed)
Pt c/o lower back pain (more on the left) that began last month.  Home interventions: aleve

## 2022-08-29 NOTE — ED Provider Notes (Signed)
[facility]    CSN: [id number] Arrival date & time: 08/29/22  1759    HISTORY   Chief Complaint  Patient presents with   Back Pain   HPI Diane [name] is a pleasant, 41 y.o. female who presents to urgent care today. Patient complains of left-sided low back pain that began about a month ago, states pain radiates to her left hip and sometimes across her left buttock.  Patient denies known injury to her lower back.  Patient states she has had back pain for years but never this bad.  Patient states when she lies on her stomach to isolate her hip the pain is much stronger.  Patient denies difficulty with walking and sitting.  Patient states pain is worse when she attempts to arise from a seated position when she has to stand for long periods of time.  Patient denies loss of bowel or bladder function, numbness or tingling in lower extremity, recent fall.  The history is provided by the patient.   Past Medical History:  Diagnosis Date   ADD (attention deficit disorder)    Anemia    Anxiety    Crohn's disease ([place])    Fibroid    H/O blood clots    tested positive but not located on doppler    Headache(784.0)    otc meds - last migraine 2009   Heart murmur    as child, never had any problems   History of endometriosis    Leiomyoma 02/09/2015   Low blood potassium    history   Migraine without aura, with intractable migraine, so stated, without mention of status migrainosus 06/19/2013   Pneumonia    history back in 2006   PONV (postoperative nausea and vomiting)    Seasonal allergies    Vaginal Pap smear, abnormal    Vasovagal syncope    Patient Active Problem List   Diagnosis Date Noted   Cervical dysplasia 11/30/2020   Dysmenorrhea 05/19/2019   H/O myomectomy 01/21/2018   Low back pain radiating to left leg 10/04/2015   Intractable migraine without aura 06/19/2013   Vertigo 06/13/2013   Headache 06/13/2013   History of endometriosis 06/13/2013   CROHN'S  DISEASE-LARGE & SMALL INTESTINE 07/27/2009   GASTROESOPHAGEAL REFLUX DISEASE, MILD 07/12/2008   Past Surgical History:  Procedure Laterality Date   ADENOIDECTOMY     CESAREAN SECTION N/A 04/14/2018   Procedure: PRIMARY CESAREAN SECTION;  Surgeon: Jonnie Kind, MD;  Location: [facility];  Service: Obstetrics;  Laterality: N/A;   DILATATION & CURRETTAGE/HYSTEROSCOPY WITH RESECTOCOPE N/A 06/09/2013   Procedure: DILATATION & CURETTAGE/HYSTEROSCOPY WITH HYSTEROSCOPIC RESECTION OF SUBMUCOSAL FIBROID AND ENDOMETRIAL POLYP;  Surgeon: Marvene Staff, MD;  Location: [facility];  Service: Gynecology;  Laterality: N/A;   LAPAROSCOPY N/A 06/09/2013   Procedure: LAPAROSCOPY DIAGNOSTIC;  Surgeon: Marvene Staff, MD;  Location: [facility];  Service: Gynecology;  Laterality: N/A;   ROBOT ASSISTED MYOMECTOMY N/A 02/09/2015   Procedure: MYOMECTOMY,EXCISION OF ENDOMETRIOSIS,PRE-SACRAL NEURECTOMY;  Surgeon: Governor Specking, MD;  Location: [facility];  Service: Gynecology;  Laterality: N/A;   ROBOTIC ASSISTED LAPAROSCOPIC LYSIS OF ADHESION N/A 06/09/2013   Procedure: ROBOTIC ASSISTED LAPAROSCOPIC LYSIS OF ADHESION; Resection of Endometriosis and excision of fibroid;  Surgeon: Marvene Staff, MD;  Location: [facility];  Service: Gynecology;  Laterality: N/A;   TONSILLECTOMY     TYMPANOSTOMY TUBE PLACEMENT     OB History     Gravida  3   Para  3  Term  2   Preterm  1   AB      Living  3      SAB      IAB      Ectopic      Multiple  0   Live Births  3          Home Medications    Prior to Admission medications   Medication Sig Start Date End Date Taking? Authorizing Provider  baclofen (LIORESAL) 10 MG tablet Take 1 tablet (10 mg total) by mouth at bedtime for 7 days. 08/29/22 09/05/22 Yes Lynden Oxford Scales, PA-C  naproxen (NAPROSYN) 500 MG tablet Take 1 tablet (500 mg total) by mouth 2 (two) times daily. 08/29/22  Yes Lynden Oxford Scales, PA-C  acetaminophen (TYLENOL)  325 MG tablet Take 2 tablets (650 mg total) by mouth every 4 (four) hours as needed (for pain scale < 4). 04/17/18   Aletha Halim, MD  amphetamine-dextroamphetamine (ADDERALL) 30 MG tablet Take 30 mg by mouth daily.    [provider]  cetirizine (ZYRTEC ALLERGY) 10 MG tablet Take 1 tablet (10 mg total) by mouth 2 (two) times daily. 08/11/22 02/07/23  Lynden Oxford Scales, PA-C  fexofenadine (ALLEGRA) 180 MG tablet Take 1 tablet (180 mg total) by mouth daily. 08/11/22 02/07/23  Lynden Oxford Scales, PA-C  ibuprofen (ADVIL) 400 MG tablet Take 1 tablet (400 mg total) by mouth every 8 (eight) hours as needed for up to 30 doses. 08/11/22   Lynden Oxford Scales, PA-C    Family History Family History  Problem Relation Age of Onset   Heart disease Mother 33       CHF   Crohn's disease Mother    Asthma Mother    Asthma Brother    Heart disease Maternal Aunt    Diabetes Maternal Aunt    Stroke Maternal Aunt    Heart disease Maternal Grandfather    Stroke Maternal Grandfather    Social History Social History   Tobacco Use   Smoking status: Never   Smokeless tobacco: Never  Vaping Use   Vaping Use: Never used  Substance Use Topics   Alcohol use: No   Drug use: No   Allergies   Patient has no known allergies.  Review of Systems Review of Systems Pertinent findings revealed after performing a 14 point review of systems has been noted in the history of present illness.  Physical Exam Triage Vital Signs ED Triage Vitals  Enc Vitals Group     BP 07/21/21 0827 (!) 147/82     Pulse Rate 07/21/21 0827 72     Resp 07/21/21 0827 18     Temp 07/21/21 0827 98.3 F (36.8 C)     Temp Source 07/21/21 0827 Oral     SpO2 07/21/21 0827 98 %     Weight --      Height --      Head Circumference --      Peak Flow --      Pain Score 07/21/21 0826 5     Pain Loc --      Pain Edu? --      Excl. in [place]? --    Updated Vital Signs BP 138/88 (BP Location: Right Arm)   Pulse 94    Temp 98.2 F (36.8 C) (Oral)   Resp 18   LMP 08/22/2022   SpO2 98%   Breastfeeding No   Physical Exam Vitals and nursing note reviewed.  Constitutional:  General: She is not in acute distress.    Appearance: Normal appearance.  HENT:     Head: Normocephalic and atraumatic.  Eyes:     Pupils: Pupils are equal, round, and reactive to light.  Cardiovascular:     Rate and Rhythm: Normal rate and regular rhythm.  Pulmonary:     Effort: Pulmonary effort is normal.     Breath sounds: Normal breath sounds.  Musculoskeletal:     Cervical back: Normal range of motion and neck supple.     Lumbar back: Spasms and tenderness present. No swelling, edema, deformity, signs of trauma, lacerations or bony tenderness. Normal range of motion. Positive left straight leg raise test. Negative right straight leg raise test. No scoliosis.  Skin:    General: Skin is warm and dry.  Neurological:     General: No focal deficit present.     Mental Status: She is alert and oriented to person, place, and time. Mental status is at baseline.  Psychiatric:        Mood and Affect: Mood normal.        Behavior: Behavior normal.        Thought Content: Thought content normal.        Judgment: Judgment normal.     UC Couse / Diagnostics / Procedures:     Radiology No results found.  Procedures Procedures (including critical care time) EKG  Pending results:  Labs Reviewed - No data to display  Medications Ordered in UC: Medications - No data to display  UC Diagnoses / Final Clinical Impressions(s)   I have reviewed the triage vital signs and the nursing notes.  Pertinent labs & imaging results that were available during my care of the patient were reviewed by me and considered in my medical decision making (see chart for details).    Final diagnoses:  Acute left-sided low back pain with left-sided sciatica    Patient was provided with an injection during their visit today for acute pain  relief.  Patient was advised to:  Take naproxen 500 mg twice daily for the next 5 to 7 days Take baclofen once daily 1 hour prior to bedtime Begin acetaminophen 1000 mg 3 times daily on a scheduled basis. Apply ice pack to affected area 4 times daily for 20 minutes each time Avoid stretching or strengthening exercises until pain is completely resolved Consider physical therapy, chiropractic care, orthopedic follow-up Return precautions advised  ED Prescriptions     Medication Sig Dispense Auth. Provider   baclofen (LIORESAL) 10 MG tablet Take 1 tablet (10 mg total) by mouth at bedtime for 7 days. 7 tablet Lynden Oxford Scales, PA-C   naproxen (NAPROSYN) 500 MG tablet Take 1 tablet (500 mg total) by mouth 2 (two) times daily. 30 tablet Lynden Oxford Scales, PA-C      PDMP not reviewed this encounter.  Discharge Instructions:   Discharge Instructions      Please go to [facility] to have x-ray of her lumbar spine performed.  You will be contacted with the results once the radiology report is complete.  I sent a prescription to your pharmacy for baclofen which is a muscle relaxer that can be taken nightly before bed for the next 7 days.  I also sent a prescription for naproxen 500 mg that you can take twice daily for pain.  Please follow-up with your primary care provider regarding the results of your x-ray of your lumbar spine and how you  are doing on naproxen and the baclofen.  Thank you for returning to see [place] here urgent care today.     Disposition Upon Discharge:  Condition: stable for discharge home Home: take medications as prescribed; routine discharge instructions as discussed; follow up as advised.  Patient presented with an acute illness with associated systemic symptoms and significant discomfort requiring urgent management. In my opinion, this is a condition that a prudent lay person (someone who possesses an average  knowledge of health and medicine) may potentially expect to result in complications if not addressed urgently such as respiratory distress, impairment of bodily function or dysfunction of bodily organs.   Routine symptom specific, illness specific and/or disease specific instructions were discussed with the patient and/or caregiver at length.   As such, the patient has been evaluated and assessed, work-up was performed and treatment was provided in alignment with urgent care protocols and evidence based medicine.  Patient/parent/caregiver has been advised that the patient may require follow up for further testing and treatment if the symptoms continue in spite of treatment, as clinically indicated and appropriate.  Patient/parent/caregiver has been advised to report to orthopedic urgent care clinic or return to the [facility] or PCP in 3-5 days if no better; follow-up with orthopedics, PCP or the Emergency Department if new signs and symptoms develop or if the current signs or symptoms continue to change or worsen for further workup, evaluation and treatment as clinically indicated and appropriate  The patient will follow up with their current PCP if and as advised. If the patient does not currently have a PCP we will have assisted them in obtaining one.   The patient may need specialty follow up if the symptoms continue, in spite of conservative treatment and management, for further workup, evaluation, consultation and treatment as clinically indicated and appropriate.  Patient/parent/caregiver verbalized understanding and agreement of plan as discussed.  All questions were addressed during visit.  Please see discharge instructions below for further details of plan.  This office note has been dictated using Museum/gallery curator.  Unfortunately, this method of dictation can sometimes lead to typographical or grammatical errors.  I apologize for your inconvenience in advance if this occurs.   Please do not hesitate to reach out to me if clarification is needed.      Lynden Oxford Scales, [place] 08/30/22 [phone]

## 2023-05-16 ENCOUNTER — Other Ambulatory Visit: Payer: Self-pay | Admitting: Medical Genetics

## 2023-05-16 DIAGNOSIS — Z006 Encounter for examination for normal comparison and control in clinical research program: Secondary | ICD-10-CM

## 2023-09-13 ENCOUNTER — Emergency Department (HOSPITAL_BASED_OUTPATIENT_CLINIC_OR_DEPARTMENT_OTHER)
Admission: EM | Admit: 2023-09-13 | Discharge: 2023-09-13 | Disposition: A | Discharge status: Home / Self Care | Payer: BC Managed Care – PPO | Attending: Emergency Medicine | Admitting: Emergency Medicine

## 2023-09-13 ENCOUNTER — Emergency Department (HOSPITAL_BASED_OUTPATIENT_CLINIC_OR_DEPARTMENT_OTHER): Payer: BC Managed Care – PPO

## 2023-09-13 ENCOUNTER — Encounter (HOSPITAL_BASED_OUTPATIENT_CLINIC_OR_DEPARTMENT_OTHER): Payer: Self-pay | Admitting: Emergency Medicine

## 2023-09-13 ENCOUNTER — Other Ambulatory Visit: Payer: Self-pay

## 2023-09-13 DIAGNOSIS — E876 Hypokalemia: Secondary | ICD-10-CM | POA: Diagnosis not present

## 2023-09-13 DIAGNOSIS — I6521 Occlusion and stenosis of right carotid artery: Secondary | ICD-10-CM | POA: Diagnosis not present

## 2023-09-13 DIAGNOSIS — R519 Headache, unspecified: Secondary | ICD-10-CM | POA: Diagnosis present

## 2023-09-13 DIAGNOSIS — I10 Essential (primary) hypertension: Secondary | ICD-10-CM | POA: Insufficient documentation

## 2023-09-13 DIAGNOSIS — R030 Elevated blood-pressure reading, without diagnosis of hypertension: Secondary | ICD-10-CM

## 2023-09-13 DIAGNOSIS — Z86718 Personal history of other venous thrombosis and embolism: Secondary | ICD-10-CM

## 2023-09-13 HISTORY — DX: Other specified abnormal findings of blood chemistry: R79.89

## 2023-09-13 LAB — COMPREHENSIVE METABOLIC PANEL
ALT: 15 U/L (ref 0–44)
AST: 14 U/L — ABNORMAL LOW (ref 15–41)
Albumin: 3.4 g/dL — ABNORMAL LOW (ref 3.5–5.0)
Alkaline Phosphatase: 80 U/L (ref 38–126)
Anion gap: 8 (ref 5–15)
BUN: 10 mg/dL (ref 6–20)
CO2: 25 mmol/L (ref 22–32)
Calcium: 8.4 mg/dL — ABNORMAL LOW (ref 8.9–10.3)
Chloride: 102 mmol/L (ref 98–111)
Creatinine, Ser: 0.55 mg/dL (ref 0.44–1.00)
GFR, Estimated: >60 mL/min (ref >60)
Glucose, Bld: 105 mg/dL — ABNORMAL HIGH (ref 70–99)
Potassium: 2.6 mmol/L — CL (ref 3.5–5.1)
Sodium: 135 mmol/L (ref 135–145)
Total Bilirubin: 0.6 mg/dL (ref <1.2)
Total Protein: 6.9 g/dL (ref 6.5–8.1)

## 2023-09-13 LAB — CBC WITH DIFFERENTIAL/PLATELET
Abs Immature Granulocytes: 0.02 10*3/uL (ref 0.00–0.07)
Basophils Absolute: 0 10*3/uL (ref 0.0–0.1)
Basophils Relative: 0 %
Eosinophils Absolute: 0.1 10*3/uL (ref 0.0–0.5)
Eosinophils Relative: 1 %
HCT: 32.3 % — ABNORMAL LOW (ref 36.0–46.0)
Hemoglobin: 11.4 g/dL — ABNORMAL LOW (ref 12.0–15.0)
Immature Granulocytes: 0 %
Lymphocytes Relative: 13 %
Lymphs Abs: 1.1 10*3/uL (ref 0.7–4.0)
MCH: 29.5 pg (ref 26.0–34.0)
MCHC: 35.3 g/dL (ref 30.0–36.0)
MCV: 83.5 fL (ref 80.0–100.0)
Monocytes Absolute: 0.4 10*3/uL (ref 0.1–1.0)
Monocytes Relative: 5 %
Neutro Abs: 6.8 10*3/uL (ref 1.7–7.7)
Neutrophils Relative %: 81 %
Platelets: 276 10*3/uL (ref 150–400)
RBC: 3.87 MIL/uL (ref 3.87–5.11)
RDW: 11.9 % (ref 11.5–15.5)
WBC: 8.4 10*3/uL (ref 4.0–10.5)
nRBC: 0 % (ref 0.0–0.2)

## 2023-09-13 LAB — LIPASE, BLOOD: Lipase: 27 U/L (ref 11–51)

## 2023-09-13 LAB — TROPONIN I (HIGH SENSITIVITY)
Troponin I (High Sensitivity): 6 ng/L (ref <18)
Troponin I (High Sensitivity): 5 ng/L (ref <18)

## 2023-09-13 LAB — URINALYSIS, ROUTINE W REFLEX MICROSCOPIC
Bilirubin Urine: NEGATIVE
Glucose, UA: NEGATIVE mg/dL
Hgb urine dipstick: NEGATIVE
Ketones, ur: NEGATIVE mg/dL
Leukocytes,Ua: NEGATIVE
Nitrite: NEGATIVE
Protein, ur: NEGATIVE mg/dL
Specific Gravity, Urine: 1.01 (ref 1.005–1.030)
pH: 7 (ref 5.0–8.0)

## 2023-09-13 LAB — D-DIMER, QUANTITATIVE: D-Dimer, Quant: 0.5 ug{FEU}/mL (ref 0.00–0.50)

## 2023-09-13 LAB — POTASSIUM: Potassium: 2.8 mmol/L — ABNORMAL LOW (ref 3.5–5.1)

## 2023-09-13 LAB — HCG, SERUM, QUALITATIVE: Preg, Serum: NEGATIVE

## 2023-09-13 LAB — MAGNESIUM: Magnesium: 1.7 mg/dL (ref 1.7–2.4)

## 2023-09-13 MED ORDER — ASPIRIN 81 MG PO CHEW: 81.0000 mg | CHEWABLE_TABLET | Freq: Every day | ORAL | 0 refills | Status: AC

## 2023-09-13 MED ORDER — DEXAMETHASONE SODIUM PHOSPHATE 4 MG/ML IJ SOLN
4.0000 mg | Freq: Once | INTRAMUSCULAR | Status: AC
  Administered 2023-09-13: 4 mg via INTRAVENOUS
  Filled 2023-09-13: qty 1

## 2023-09-13 MED ORDER — IOHEXOL 350 MG/ML SOLN
75.0000 mL | Freq: Once | INTRAVENOUS | Status: AC | PRN
  Administered 2023-09-13: 75 mL via INTRAVENOUS

## 2023-09-13 MED ORDER — POTASSIUM CHLORIDE CRYS ER 20 MEQ PO TBCR
40.0000 meq | EXTENDED_RELEASE_TABLET | Freq: Once | ORAL | Status: DC
  Filled 2023-09-13: qty 2

## 2023-09-13 MED ORDER — DIPHENHYDRAMINE HCL 50 MG/ML IJ SOLN
12.5000 mg | Freq: Once | INTRAMUSCULAR | Status: AC
  Administered 2023-09-13: 12.5 mg via INTRAVENOUS
  Filled 2023-09-13: qty 1

## 2023-09-13 MED ORDER — LORAZEPAM 2 MG/ML IJ SOLN
0.5000 mg | Freq: Once | INTRAMUSCULAR | Status: AC
  Administered 2023-09-13: 0.5 mg via INTRAVENOUS
  Filled 2023-09-13: qty 1

## 2023-09-13 MED ORDER — POTASSIUM CHLORIDE 10 MEQ/100ML IV SOLN
10.0000 meq | INTRAVENOUS | Status: AC
Start: 2023-09-13 — End: 2023-09-13
  Administered 2023-09-13 (×2): 10 meq via INTRAVENOUS
  Filled 2023-09-13 (×2): qty 100

## 2023-09-13 MED ORDER — METOCLOPRAMIDE HCL 5 MG/ML IJ SOLN
10.0000 mg | Freq: Once | INTRAMUSCULAR | Status: AC
  Administered 2023-09-13: 10 mg via INTRAVENOUS
  Filled 2023-09-13: qty 2

## 2023-09-13 MED ORDER — KETOROLAC TROMETHAMINE 30 MG/ML IJ SOLN
15.0000 mg | Freq: Once | INTRAMUSCULAR | Status: AC
  Administered 2023-09-13: 15 mg via INTRAVENOUS
  Filled 2023-09-13: qty 1

## 2023-09-13 MED ORDER — POTASSIUM CHLORIDE CRYS ER 20 MEQ PO TBCR: 20.0000 meq | EXTENDED_RELEASE_TABLET | Freq: Two times a day (BID) | ORAL | 0 refills | Status: DC

## 2023-09-13 NOTE — ED Provider Notes (Signed)
[place] EMERGENCY DEPARTMENT AT [facility] Provider Note   CSN: [id number] Arrival date & time: 09/13/23  0138     History  Chief Complaint  Patient presents with   Hypertension   Chest Pain    Diane [name] is a 42 y.o. female.  Patient with a history of Crohn's disease not on medications, heart murmur, headache, endometriosis presenting with headache and elevated blood pressure.  States for the past 3 days she has had a severe headache after getting some news that one of her church members passed away.  The headache is sharp and stabbing and comes and goes.  She is taking Tylenol without relief.  Headache is associate with photophobia and phonophobia.  Nausea but no vomiting.  She checked her blood pressure at work today because of the headache and noticed it was 140s to 150s over 90s to 100s.  She does not normally have a history of hypertension.  Does not take blood pressure medications.  Denies any room spinning dizziness or lightheadedness.  Denies any focal weakness, numbness or tingling.  No visual changes worse than her baseline.  States she has blurry vision from not wearing her glasses which has not changed today.  Denies any difficulty speaking or difficulty swallowing. No room spinning dizziness.  No focal weakness in arms or legs.  No difficulty speaking or difficulty swallowing.  Headache is not thunderclap onset She has been concerned about a sinus infection taking Sudafed denies any cough, fever, runny nose or sore throat.  Also has had intermittent right-sided chest pain today that is been fairly constant but comes and goes.  Pain is sometimes worse with palpation and movement.  No associated shortness of breath, cough, fever, nausea or vomiting.  The history is provided by the patient.  Hypertension Associated symptoms include chest pain and headaches. Pertinent negatives include no abdominal pain.  Chest Pain Associated symptoms: headache and nausea    Associated symptoms: no abdominal pain, no dizziness, no fever, no numbness and no vomiting        Home Medications Prior to Admission medications   Medication Sig Start Date End Date Taking? Authorizing Provider  acetaminophen (TYLENOL) 325 MG tablet Take 2 tablets (650 mg total) by mouth every 4 (four) hours as needed (for pain scale < 4). 04/17/18   [place] Bing, MD  amphetamine-dextroamphetamine (ADDERALL) 30 MG tablet Take 30 mg by mouth daily.    [provider]  cetirizine (ZYRTEC ALLERGY) 10 MG tablet Take 1 tablet (10 mg total) by mouth 2 (two) times daily. 08/11/22 02/07/23  Theadora Rama Scales, PA-C  fexofenadine (ALLEGRA) 180 MG tablet Take 1 tablet (180 mg total) by mouth daily. 08/11/22 02/07/23  Theadora Rama Scales, PA-C  ibuprofen (ADVIL) 400 MG tablet Take 1 tablet (400 mg total) by mouth every 8 (eight) hours as needed for up to 30 doses. 08/11/22   Theadora Rama Scales, PA-C  naproxen (NAPROSYN) 500 MG tablet Take 1 tablet (500 mg total) by mouth 2 (two) times daily. 08/29/22   Theadora Rama Scales, PA-C      Allergies    Patient has no known allergies.    Review of Systems   Review of Systems  Constitutional:  Negative for activity change, appetite change and fever.  HENT:  Negative for congestion and rhinorrhea.   Eyes:  Positive for photophobia and visual disturbance.  Respiratory:  Negative for choking.   Cardiovascular:  Positive for chest pain.  Gastrointestinal:  Positive for nausea.  Negative for abdominal pain and vomiting.  Genitourinary:  Negative for dysuria and pelvic pain.  Musculoskeletal:  Negative for arthralgias and myalgias.  Skin:  Negative for rash.  Neurological:  Positive for headaches. Negative for dizziness and numbness.   all other systems are negative except as noted in the HPI and PMH.    Physical Exam Updated Vital Signs BP (!) 148/104 (BP Location: Left Arm)   Pulse (!) 114   Temp 98.6 F (37 C) (Oral)   Resp  20   Ht 5\' 7"  (1.702 m)   Wt 86.2 kg   LMP 09/04/2023 (Exact Date)   SpO2 100%   BMI 29.76 kg/m  Physical Exam Vitals and nursing note reviewed.  Constitutional:      General: She is not in acute distress.    Appearance: She is well-developed.  HENT:     Head: Normocephalic and atraumatic.     Mouth/Throat:     Pharynx: No oropharyngeal exudate.  Eyes:     Conjunctiva/sclera: Conjunctivae normal.     Pupils: Pupils are equal, round, and reactive to light.  Neck:     Comments: No meningismus. Cardiovascular:     Rate and Rhythm: Regular rhythm. Tachycardia present.     Heart sounds: Normal heart sounds. No murmur heard. Pulmonary:     Effort: Pulmonary effort is normal. No respiratory distress.     Breath sounds: Normal breath sounds.  Chest:     Chest wall: Tenderness present.  Abdominal:     Palpations: Abdomen is soft.     Tenderness: There is no abdominal tenderness. There is no guarding or rebound.  Musculoskeletal:        General: No tenderness. Normal range of motion.     Cervical back: Normal range of motion and neck supple.  Skin:    General: Skin is warm.  Neurological:     General: No focal deficit present.     Mental Status: She is alert and oriented to person, place, and time.     Cranial Nerves: No cranial nerve deficit.     Motor: No weakness or abnormal muscle tone.     Coordination: Coordination normal.     Comments: CN 2-12 intact, no ataxia on finger to nose, no nystagmus, 5/5 strength throughout, no pronator drift, Romberg negative, normal gait.   Psychiatric:        Behavior: Behavior normal.     ED Results / Procedures / Treatments   Labs (all labs ordered are listed, but only abnormal results are displayed) Labs Reviewed  CBC WITH DIFFERENTIAL/PLATELET - Abnormal; Notable for the following components:      Result Value   Hemoglobin 11.4 (*)    HCT 32.3 (*)    All other components within normal limits  COMPREHENSIVE METABOLIC PANEL -  Abnormal; Notable for the following components:   Potassium 2.6 (*)    Glucose, Bld 105 (*)    Calcium 8.4 (*)    Albumin 3.4 (*)    AST 14 (*)    All other components within normal limits  POTASSIUM - Abnormal; Notable for the following components:   Potassium 2.8 (*)    All other components within normal limits  LIPASE, BLOOD  HCG, SERUM, QUALITATIVE  URINALYSIS, ROUTINE W REFLEX MICROSCOPIC  D-DIMER, QUANTITATIVE  MAGNESIUM  TROPONIN I (HIGH SENSITIVITY)  TROPONIN I (HIGH SENSITIVITY)    EKG EKG Interpretation Date/Time:  Friday September 13 2023 02:03:34 EST Ventricular Rate:  101 PR Interval:  172  QRS Duration:  91 QT Interval:  412 QTC Calculation: 535 R Axis:   31  Text Interpretation: Sinus tachycardia Borderline T abnormalities, anterior leads Prolonged QT interval No significant change was found Confirmed by Glynn Octave [phone]) on 09/13/2023 2:28:38 AM  Radiology DG Chest 2 View Result Date: 09/13/2023 CLINICAL DATA:  Chest pain EXAM: CHEST - 2 VIEW COMPARISON:  01/05/2022 FINDINGS: The heart size and mediastinal contours are within normal limits. Both lungs are clear. The visualized skeletal structures are unremarkable. IMPRESSION: No active cardiopulmonary disease. Electronically Signed   By: Deatra Robinson M.D.   On: 09/13/2023 04:00   CT ANGIO HEAD NECK W WO CM Result Date: 09/13/2023 CLINICAL DATA:  Headache and elevated blood pressure EXAM: CT ANGIOGRAPHY HEAD AND NECK WITH AND WITHOUT CONTRAST TECHNIQUE: Multidetector CT imaging of the head and neck was performed using the standard protocol during bolus administration of intravenous contrast. Multiplanar CT image reconstructions and MIPs were obtained to evaluate the vascular anatomy. Carotid stenosis measurements (when applicable) are obtained utilizing NASCET criteria, using the distal internal carotid diameter as the denominator. RADIATION DOSE REDUCTION: This exam was performed according to the  departmental dose-optimization program which includes automated exposure control, adjustment of the mA and/or kV according to patient size and/or use of iterative reconstruction technique. CONTRAST:  75mL OMNIPAQUE IOHEXOL 350 MG/ML SOLN COMPARISON:  Head CT 06/13/2013 FINDINGS: CT HEAD FINDINGS Brain: No mass,hemorrhage or extra-axial collection. Normal appearance of the parenchyma and CSF spaces. Vascular: No hyperdense vessel or unexpected vascular calcification. Skull: The visualized skull base, calvarium and extracranial soft tissues are normal. Sinuses/Orbits: No fluid levels or advanced mucosal thickening of the visualized paranasal sinuses. No mastoid or middle ear effusion. Normal orbits. CTA NECK FINDINGS Skeleton: No acute abnormality or high grade bony spinal canal stenosis. Other neck: Normal pharynx, larynx and major salivary glands. No cervical lymphadenopathy. Unremarkable thyroid gland. Upper chest: No pneumothorax or pleural effusion. No nodules or masses. Aortic arch: There is no calcific atherosclerosis of the aortic arch. Conventional 3 vessel aortic branching pattern. RIGHT carotid system: The right ICA is somewhat diminutive relative to the left. No plaque or stenosis. No acute abnormality. LEFT carotid system: Normal without aneurysm, dissection or stenosis. Vertebral arteries: Left dominant configuration. There is no dissection, occlusion or flow-limiting stenosis to the skull base (V1-V3 segments). CTA HEAD FINDINGS POSTERIOR CIRCULATION: Vertebral arteries are normal. No proximal occlusion of the anterior or inferior cerebellar arteries. Basilar artery is normal. Superior cerebellar arteries are normal. Posterior cerebral arteries are normal. ANTERIOR CIRCULATION: Severe stenosis of the right ICA at the skull base, worst in the proximal lacerum segment and the distal cavernous segment. The left ICA is normal. Anterior cerebral arteries are normal. Middle cerebral arteries are normal.  Venous sinuses: As permitted by contrast timing, patent. Anatomic variants: None Review of the MIP images confirms the above findings. IMPRESSION: 1. No acute intracranial abnormality. 2. Severe stenosis of the right ICA at the skull base, worst in the proximal lacerum segment and the distal cavernous segment. Etiology for this narrowing is unclear and the age is indeterminate. Electronically Signed   By: Deatra Robinson M.D.   On: 09/13/2023 03:48    Procedures Procedures    Medications Ordered in ED Medications  ketorolac (TORADOL) 30 MG/ML injection 15 mg (has no administration in time range)  metoCLOPramide (REGLAN) injection 10 mg (has no administration in time range)    ED Course/ Medical Decision Making/ A&P  Medical Decision Making Amount and/or Complexity of Data Reviewed Labs: ordered. Decision-making details documented in ED Course. Radiology: ordered and independent interpretation performed. Decision-making details documented in ED Course. ECG/medicine tests: ordered and independent interpretation performed. Decision-making details documented in ED Course.  Risk OTC drugs. Prescription drug management.   3 days of diffuse headache gradual in onset.  Neurological exam is nonfocal.  Associated with elevated blood pressure.  Does have recent stressors as as well as been using Sudafed  EKG shows no acute ischemia.  This revealed prolonged QT at 535.  Will repeat.  Neurological exam is nonfocal.  Low suspicion for TIA or CVA.  Hypokalemia 2.6.  Will replace.  Check magnesium.  This is likely contributing to her prolonged QT.  CT head is obtained given her severe headache with elevated blood pressure.  This is negative for hemorrhage or large vessel occlusion.  Does show right carotid artery narrowing of uncertain etiology.  Headache has improved.  Low suspicion for subarachnoid hemorrhage, meningitis, temporal arteritis.  Blood pressure  138/93.  Troponin is negative x 2.  Low suspicion for ACS.  D-dimer is negative with low suspicion for PE.  Chest pain is atypical for ACS. Potassium improving to 2.8.  Will continue supplementation as an outpatient  CTA findings discussed with Dr. Derry Lory neurology.  He agrees with outpatient follow-up with vascular surgery and/or interventional radiology.  Will start small dose aspirin.  Patient neurologically intact with no deficits.  Discussed with Dr. Hetty Blend of vascular surgery.  He feels neuro IR would be appropriate follow-up for this patient as there is no stenosis of her cervical carotid artery and this is at the skull base and intracranially.  Message sent to Dr. Corliss Skains.  Patient will be initiated on aspirin.  Follow-up with PCP for blood pressure check as well as repeat check potassium later this week. D/w Dr. Corliss Skains who will see patient in followup regarding carotid stenosis.   Return to the ED with severe headache, headache associated with unilateral weakness, numbness, tingling, difficulty speaking with difficulty swallowing or any other concerns.       Final Clinical Impression(s) / ED Diagnoses Final diagnoses:  Bad headache  Elevated blood pressure reading  Hypokalemia  Stenosis of right carotid artery    Rx / DC Orders ED Discharge Orders     None         Nazanin Kinner, Jeannett Senior, MD 09/13/23 [phone]

## 2023-09-13 NOTE — ED Triage Notes (Signed)
Headache since Tuesday , as had a loss and has been crying a lot, had a nurse at work check her BP  and was elevated, this morning has elevated BP, HR and chest discomfort.

## 2023-09-13 NOTE — Discharge Instructions (Addendum)
Take the potassium supplementation as prescribed.  Follow-up with your doctor for further management of your blood pressure.  Check your blood pressure on a daily basis and keep a record for your doctor to follow-up.  Your CT scan today did show narrowing of the right carotid artery.  Uncertain what is causing this.  You should follow-up with the neuroradiologist Dr. Corliss Skains for evaluation of this. Start taking a baby aspirin (81mg ) daily.  Return to the ED with worsening headache, headache associate with weakness, numbness, tingling, room spinning dizziness, chest pain, any other concerns.

## 2023-09-14 ENCOUNTER — Other Ambulatory Visit: Payer: Self-pay

## 2023-09-14 ENCOUNTER — Emergency Department (HOSPITAL_COMMUNITY)
Admission: EM | Admit: 2023-09-14 | Discharge: 2023-09-15 | Disposition: A | Discharge status: Home / Self Care | Payer: BC Managed Care – PPO | Attending: Emergency Medicine | Admitting: Emergency Medicine

## 2023-09-14 ENCOUNTER — Emergency Department (HOSPITAL_COMMUNITY): Payer: BC Managed Care – PPO

## 2023-09-14 ENCOUNTER — Encounter (HOSPITAL_COMMUNITY): Payer: Self-pay

## 2023-09-14 DIAGNOSIS — J181 Lobar pneumonia, unspecified organism: Secondary | ICD-10-CM | POA: Insufficient documentation

## 2023-09-14 DIAGNOSIS — E876 Hypokalemia: Secondary | ICD-10-CM

## 2023-09-14 DIAGNOSIS — J189 Pneumonia, unspecified organism: Secondary | ICD-10-CM

## 2023-09-14 DIAGNOSIS — Z1152 Encounter for screening for COVID-19: Secondary | ICD-10-CM | POA: Diagnosis not present

## 2023-09-14 DIAGNOSIS — R509 Fever, unspecified: Secondary | ICD-10-CM | POA: Diagnosis present

## 2023-09-14 DIAGNOSIS — R519 Headache, unspecified: Secondary | ICD-10-CM

## 2023-09-14 LAB — TROPONIN I (HIGH SENSITIVITY): Troponin I (High Sensitivity): 4 ng/L (ref <18)

## 2023-09-14 LAB — CBC
HCT: 35.6 % — ABNORMAL LOW (ref 36.0–46.0)
Hemoglobin: 12.3 g/dL (ref 12.0–15.0)
MCH: 29.8 pg (ref 26.0–34.0)
MCHC: 34.6 g/dL (ref 30.0–36.0)
MCV: 86.2 fL (ref 80.0–100.0)
Platelets: 332 10*3/uL (ref 150–400)
RBC: 4.13 MIL/uL (ref 3.87–5.11)
RDW: 11.9 % (ref 11.5–15.5)
WBC: 7.8 10*3/uL (ref 4.0–10.5)
nRBC: 0 % (ref 0.0–0.2)

## 2023-09-14 LAB — URINALYSIS, W/ REFLEX TO CULTURE (INFECTION SUSPECTED)
Bilirubin Urine: NEGATIVE
Glucose, UA: NEGATIVE mg/dL
Hgb urine dipstick: NEGATIVE
Ketones, ur: NEGATIVE mg/dL
Leukocytes,Ua: NEGATIVE
Nitrite: NEGATIVE
Protein, ur: NEGATIVE mg/dL
Specific Gravity, Urine: 1.01 (ref 1.005–1.030)
pH: 6 (ref 5.0–8.0)

## 2023-09-14 LAB — BASIC METABOLIC PANEL
Anion gap: 11 (ref 5–15)
BUN: 7 mg/dL (ref 6–20)
CO2: 26 mmol/L (ref 22–32)
Calcium: 8.6 mg/dL — ABNORMAL LOW (ref 8.9–10.3)
Chloride: 100 mmol/L (ref 98–111)
Creatinine, Ser: 0.75 mg/dL (ref 0.44–1.00)
GFR, Estimated: >60 mL/min (ref >60)
Glucose, Bld: 97 mg/dL (ref 70–99)
Potassium: 2.9 mmol/L — ABNORMAL LOW (ref 3.5–5.1)
Sodium: 137 mmol/L (ref 135–145)

## 2023-09-14 LAB — HCG, SERUM, QUALITATIVE: Preg, Serum: NEGATIVE

## 2023-09-14 MED ORDER — ACETAMINOPHEN 500 MG PO TABS
1000.0000 mg | ORAL_TABLET | Freq: Once | ORAL | Status: AC
  Administered 2023-09-14: 1000 mg via ORAL
  Filled 2023-09-14: qty 2

## 2023-09-14 MED ORDER — POTASSIUM CHLORIDE CRYS ER 20 MEQ PO TBCR
40.0000 meq | EXTENDED_RELEASE_TABLET | Freq: Once | ORAL | Status: DC
  Filled 2023-09-14: qty 2

## 2023-09-14 MED ORDER — AZITHROMYCIN 250 MG PO TABS: 250.0000 mg | ORAL_TABLET | Freq: Every day | ORAL | 0 refills | Status: DC

## 2023-09-14 MED ORDER — AMLODIPINE BESYLATE 5 MG PO TABS: 5.0000 mg | ORAL_TABLET | Freq: Every day | ORAL | 0 refills | Status: DC

## 2023-09-14 MED ORDER — AMLODIPINE BESYLATE 5 MG PO TABS
5.0000 mg | ORAL_TABLET | Freq: Once | ORAL | Status: AC
  Administered 2023-09-15: 5 mg via ORAL
  Filled 2023-09-14: qty 1

## 2023-09-14 MED ORDER — AMOXICILLIN-POT CLAVULANATE 875-125 MG PO TABS: 1.0000 | ORAL_TABLET | Freq: Two times a day (BID) | ORAL | 0 refills | Status: DC

## 2023-09-14 NOTE — Discharge Instructions (Addendum)
You have been seen here in the emergency department for pneumonia and fever. We have obtained a full history, performed a physical exam, in addition to other diagnostic tests and treatments. Right now, we feel that you are safe for discharge from a medical perspective, and do not have an acute life threatening illness.   To do: 1.) Take all medications as prescribed.   2.) If anything changes, or you develop fevers, chills, inability to eat or drink, severe pain, new symptoms, return of symptoms, worsening of symptoms, or any other concerns, please call 911 or come back to the emergency department as soon as possible.   3.) Please make an appointment with your primary care doctor for a follow-up visit after being seen here in the emergency department. If you do not have a primary care doctor, you can call [phone] for assistance in finding one or your health care insurance company.    4.) Take medication as prescribed.   5.) Take blood pressure med. You need to follow up regarding your carotid stenosis.  Thank you for allowing me to take care of you today. We hope that you feel better soon.

## 2023-09-14 NOTE — ED Triage Notes (Signed)
Patient was seen at [facility] and diagnosed with stenosis of carotid artery. Today she presents with headache and  high blood pressure. States she the headache has not gone away since Tuesday. States she also has some chest pain. Headache rated 7/10. States she has intermittent right sided chest pain and radiates under her right shoulder. Patient is now taking 1 81mg  ASA daily.

## 2023-09-14 NOTE — ED Provider Notes (Cosign Needed Addendum)
[place] EMERGENCY DEPARTMENT AT [facility] Provider Note  HPI   Diane [name] is a 42 y.o. female patient with a PMHx of Crohn's in remission, endometriosis who is here today with concern for headache and high blood pressure.  Patient said for the past 3 to 4 days, she has had a right-sided headache, was seen yesterday at another hospital, was found to have severe intracranial carotid arterial stenosis, started on aspirin, vascular surgery and neurology are both talked to, started on aspirin, and going to be referred to IR neurology, for potential intervention.  She already had this referral placed.  She is here today with continued headache and concern for stroke as her blood pressure is high in the 160s   ROS Negative except as per HPI   Medical Decision Making   Upon presentation, the patient is febrile to 100.5, tachycardic to 115, hypertensive to 160s.  On room air.  For this patient, there is no focal neurodeficit no dysarthria, or aphasia that would suggest stroke, or focal neurodeficit.  She does have this fever though.  We worked up the fever, she does appear to have a right lower lobe pneumonia.  Her urine ultimately came back for rare bacteria but no signs of infection.  Potassium is low again which we will replete.  I gave her a dose of amlodipine 5 mg, and I have sent her in a prescription for amlodipine as well, I do think that she is probably having hypertensive disease.  Her pregnancy was negative, her hematology panel was normal we did 1 troponin because she had some reported chest pain earlier, this is resolved and she has had no chest pain here I do not think she requires a second troponin of note had a 2 negative troponins yesterday as well as a negative D-dimer.  Her ECG was reviewed here which showed sinus tachycardia PVCs.  I think we can safely discharge this patient, COVID and flu is pending which we will follow-up with results online, we have addressed her  blood pressure adjust her potassium, and addressed her concern of having a stroke which she is not having.  She already has follow-up arranged for this carotid stenosis, and she will follow-up accordingly.  She will come back if any acutely changes and have counseled her on the results  Will do amoxicillin and azithromycin for pneumonia coverage.   1. Pneumonia of right lower lobe due to infectious organism     @DISPOSITION @  Rx / DC Orders ED Discharge Orders          Ordered    amLODipine (NORVASC) 5 MG tablet  Daily        09/14/23 2324    amoxicillin-clavulanate (AUGMENTIN) 875-125 MG tablet  Every 12 hours        09/14/23 2344    azithromycin (ZITHROMAX) 250 MG tablet  Daily        09/14/23 2345             Past Medical History:  Diagnosis Date   ADD (attention deficit disorder)    Anemia    Anxiety    Crohn's disease (HCC)    D-dimer, elevated    Fibroid    Headache(784.0)    otc meds - last migraine 2009   Heart murmur    as child, never had any problems   History of endometriosis    Leiomyoma 02/09/2015   Low blood potassium    history   Migraine without aura,  with intractable migraine, so stated, without mention of status migrainosus 06/19/2013   Pneumonia    history back in 2006   PONV (postoperative nausea and vomiting)    Seasonal allergies    Vaginal Pap smear, abnormal    Vasovagal syncope    Past Surgical History:  Procedure Laterality Date   ADENOIDECTOMY     CESAREAN SECTION N/A 04/14/2018   Procedure: PRIMARY CESAREAN SECTION;  Surgeon: Tilda Burrow, MD;  Location: [facility] BIRTHING SUITES;  Service: Obstetrics;  Laterality: N/A;   DILATATION & CURRETTAGE/HYSTEROSCOPY WITH RESECTOCOPE N/A 06/09/2013   Procedure: DILATATION & CURETTAGE/HYSTEROSCOPY WITH HYSTEROSCOPIC RESECTION OF SUBMUCOSAL FIBROID AND ENDOMETRIAL POLYP;  Surgeon: Serita Kyle, MD;  Location: [facility];  Service: Gynecology;  Laterality: N/A;   LAPAROSCOPY N/A 06/09/2013    Procedure: LAPAROSCOPY DIAGNOSTIC;  Surgeon: Serita Kyle, MD;  Location: [facility];  Service: Gynecology;  Laterality: N/A;   ROBOT ASSISTED MYOMECTOMY N/A 02/09/2015   Procedure: MYOMECTOMY,EXCISION OF ENDOMETRIOSIS,PRE-SACRAL NEURECTOMY;  Surgeon: Fermin Schwab, MD;  Location: [facility];  Service: Gynecology;  Laterality: N/A;   ROBOTIC ASSISTED LAPAROSCOPIC LYSIS OF ADHESION N/A 06/09/2013   Procedure: ROBOTIC ASSISTED LAPAROSCOPIC LYSIS OF ADHESION; Resection of Endometriosis and excision of fibroid;  Surgeon: Serita Kyle, MD;  Location: [facility];  Service: Gynecology;  Laterality: N/A;   TONSILLECTOMY     TYMPANOSTOMY TUBE PLACEMENT     Family History  Problem Relation Age of Onset   Heart disease Mother 71       CHF   Crohn's disease Mother    Asthma Mother    Asthma Brother    Heart disease Maternal Aunt    Diabetes Maternal Aunt    Stroke Maternal Aunt    Heart disease Maternal Grandfather    Stroke Maternal Grandfather    Social History   Socioeconomic History   Marital status: Divorced    Spouse name: Not on file   Number of children: 2   Years of education: some coll.   Highest education level: Not on file  Occupational History   Not on file  Tobacco Use   Smoking status: Never   Smokeless tobacco: Never  Vaping Use   Vaping status: Never Used  Substance and Sexual Activity   Alcohol use: No   Drug use: No   Sexual activity: Yes    Birth control/protection: None  Other Topics Concern   Not on file  Social History Narrative   Patient does not drink caffeine.   Patient is right handed.    Social Drivers of Corporate investment banker Strain: Not on file  Food Insecurity: Not on file  Transportation Needs: Not on file  Physical Activity: Not on file  Stress: Not on file  Social Connections: Not on file  Intimate Partner Violence: Not on file     Physical Exam   Vitals:   09/14/23 2118 09/14/23 2123 09/14/23 2230  BP: (!) 161/104  (!)  154/98  Pulse: (!) 115  100  Resp: 16  14  Temp: (!) 100.5 F (38.1 C)    SpO2: 98% 98% 99%  Weight:  86.2 kg   Height:  5\' 7"  (1.702 m)     Physical Exam Vitals and nursing note reviewed.  Constitutional:      General: She is not in acute distress.    Appearance: She is well-developed.  HENT:     Head: Normocephalic and atraumatic.  Eyes:     Conjunctiva/sclera: Conjunctivae normal.  Cardiovascular:     Rate and Rhythm: Normal rate and regular rhythm.     Heart sounds: No murmur heard. Pulmonary:     Effort: Pulmonary effort is normal. No respiratory distress.     Breath sounds: Normal breath sounds.  Abdominal:     Palpations: Abdomen is soft.     Tenderness: There is no abdominal tenderness.  Musculoskeletal:        General: No swelling.     Cervical back: Neck supple.  Skin:    General: Skin is warm and dry.     Capillary Refill: Capillary refill takes less than 2 seconds.  Neurological:     General: No focal deficit present.     Mental Status: She is alert and oriented to person, place, and time.     Comments: Cranial nerves II through XII intact, cerebellar testing with finger-nose intact, strength intact and 5 out of 5 in flexion extension major muscle groups in upper and lower extremities, sensation intact no functionally  Psychiatric:        Mood and Affect: Mood normal.      Procedures   If procedures were preformed on this patient, they are listed below:  Procedures  The patient was seen, evaluated, and treated in conjunction with the attending physician, who voiced agreement in the care provided.  Note generated using Dragon voice dictation software and may contain dictation errors. Please contact me for any clarification or with any questions.   Electronically signed by:  Osvaldo Shipper, M.D. (PGY-2)    Gunnar Bulla, MD 09/14/23 Ouida Sills    Gunnar Bulla, MD 09/14/23 2345    Eber Hong, MD 09/15/23 1122

## 2023-09-15 LAB — RESP PANEL BY RT-PCR (RSV, FLU A&B, COVID)  RVPGX2
Influenza A by PCR: NEGATIVE
Influenza B by PCR: NEGATIVE
Resp Syncytial Virus by PCR: NEGATIVE
SARS Coronavirus 2 by RT PCR: NEGATIVE

## 2023-09-15 MED ORDER — ONDANSETRON 4 MG PO TBDP
4.0000 mg | ORAL_TABLET | Freq: Once | ORAL | Status: AC
  Administered 2023-09-15: 4 mg via ORAL
  Filled 2023-09-15: qty 1

## 2023-09-15 MED ORDER — AMOXICILLIN-POT CLAVULANATE 875-125 MG PO TABS
1.0000 | ORAL_TABLET | Freq: Once | ORAL | Status: AC
  Administered 2023-09-15: 1 via ORAL
  Filled 2023-09-15: qty 1

## 2023-09-15 MED ORDER — POTASSIUM CHLORIDE 20 MEQ PO PACK
40.0000 meq | PACK | Freq: Two times a day (BID) | ORAL | Status: DC
  Administered 2023-09-15: 40 meq via ORAL
  Filled 2023-09-15: qty 2

## 2023-09-15 MED ORDER — KETOROLAC TROMETHAMINE 15 MG/ML IJ SOLN
15.0000 mg | Freq: Once | INTRAMUSCULAR | Status: AC
  Administered 2023-09-15: 15 mg via INTRAMUSCULAR
  Filled 2023-09-15: qty 1

## 2023-09-15 MED ORDER — FLUCONAZOLE 200 MG PO TABS: 200.0000 mg | ORAL_TABLET | Freq: Every day | ORAL | 0 refills | Status: AC

## 2023-09-15 MED ORDER — DIPHENHYDRAMINE HCL 50 MG/ML IJ SOLN
25.0000 mg | Freq: Once | INTRAMUSCULAR | Status: AC
  Administered 2023-09-15: 25 mg via INTRAMUSCULAR
  Filled 2023-09-15: qty 1

## 2023-09-15 MED ORDER — AMLODIPINE BESYLATE 5 MG PO TABS: 5.0000 mg | ORAL_TABLET | Freq: Every day | ORAL | 0 refills | Status: DC

## 2023-09-15 MED ORDER — PROCHLORPERAZINE EDISYLATE 10 MG/2ML IJ SOLN
10.0000 mg | Freq: Once | INTRAMUSCULAR | Status: AC
  Administered 2023-09-15: 10 mg via INTRAMUSCULAR
  Filled 2023-09-15: qty 2

## 2023-09-15 MED ORDER — ONDANSETRON 4 MG PO TBDP: 4.0000 mg | ORAL_TABLET | Freq: Three times a day (TID) | ORAL | 0 refills | Status: DC | PRN

## 2023-09-16 ENCOUNTER — Other Ambulatory Visit (HOSPITAL_COMMUNITY): Payer: Self-pay | Admitting: Interventional Radiology

## 2023-09-16 DIAGNOSIS — I771 Stricture of artery: Secondary | ICD-10-CM

## 2023-09-20 ENCOUNTER — Other Ambulatory Visit (HOSPITAL_COMMUNITY): Payer: Self-pay | Admitting: Interventional Radiology

## 2023-09-20 ENCOUNTER — Ambulatory Visit (HOSPITAL_COMMUNITY)
Admission: RE | Admit: 2023-09-20 | Discharge: 2023-09-20 | Disposition: A | Discharge status: Home / Self Care | Payer: BC Managed Care – PPO | Source: Ambulatory Visit | Attending: Interventional Radiology | Admitting: Interventional Radiology

## 2023-09-20 DIAGNOSIS — I771 Stricture of artery: Secondary | ICD-10-CM

## 2023-09-20 DIAGNOSIS — I6521 Occlusion and stenosis of right carotid artery: Secondary | ICD-10-CM

## 2023-09-27 ENCOUNTER — Other Ambulatory Visit: Payer: Self-pay | Admitting: Student

## 2023-09-27 DIAGNOSIS — I6521 Occlusion and stenosis of right carotid artery: Secondary | ICD-10-CM

## 2023-09-28 ENCOUNTER — Emergency Department (HOSPITAL_BASED_OUTPATIENT_CLINIC_OR_DEPARTMENT_OTHER)
Admission: EM | Admit: 2023-09-28 | Discharge: 2023-09-28 | Disposition: A | Discharge status: Home / Self Care | Payer: BC Managed Care – PPO | Attending: Emergency Medicine | Admitting: Emergency Medicine

## 2023-09-28 ENCOUNTER — Emergency Department (HOSPITAL_COMMUNITY): Payer: BC Managed Care – PPO

## 2023-09-28 ENCOUNTER — Encounter (HOSPITAL_BASED_OUTPATIENT_CLINIC_OR_DEPARTMENT_OTHER): Payer: Self-pay | Admitting: Emergency Medicine

## 2023-09-28 ENCOUNTER — Emergency Department (HOSPITAL_BASED_OUTPATIENT_CLINIC_OR_DEPARTMENT_OTHER): Payer: BC Managed Care – PPO

## 2023-09-28 ENCOUNTER — Other Ambulatory Visit: Payer: Self-pay

## 2023-09-28 DIAGNOSIS — R110 Nausea: Secondary | ICD-10-CM | POA: Diagnosis not present

## 2023-09-28 DIAGNOSIS — R519 Headache, unspecified: Secondary | ICD-10-CM | POA: Insufficient documentation

## 2023-09-28 DIAGNOSIS — M79605 Pain in left leg: Secondary | ICD-10-CM

## 2023-09-28 LAB — COMPREHENSIVE METABOLIC PANEL
ALT: 14 U/L (ref 0–44)
AST: 15 U/L (ref 15–41)
Albumin: 3.7 g/dL (ref 3.5–5.0)
Alkaline Phosphatase: 82 U/L (ref 38–126)
Anion gap: 8 (ref 5–15)
BUN: 15 mg/dL (ref 6–20)
CO2: 24 mmol/L (ref 22–32)
Calcium: 8.7 mg/dL — ABNORMAL LOW (ref 8.9–10.3)
Chloride: 104 mmol/L (ref 98–111)
Creatinine, Ser: 0.72 mg/dL (ref 0.44–1.00)
GFR, Estimated: >60 mL/min (ref >60)
Glucose, Bld: 110 mg/dL — ABNORMAL HIGH (ref 70–99)
Potassium: 3.4 mmol/L — ABNORMAL LOW (ref 3.5–5.1)
Sodium: 136 mmol/L (ref 135–145)
Total Bilirubin: 0.4 mg/dL (ref 0.0–1.2)
Total Protein: 7.3 g/dL (ref 6.5–8.1)

## 2023-09-28 LAB — CBC
HCT: 35.1 % — ABNORMAL LOW (ref 36.0–46.0)
Hemoglobin: 12 g/dL (ref 12.0–15.0)
MCH: 29.6 pg (ref 26.0–34.0)
MCHC: 34.2 g/dL (ref 30.0–36.0)
MCV: 86.7 fL (ref 80.0–100.0)
Platelets: 345 10*3/uL (ref 150–400)
RBC: 4.05 MIL/uL (ref 3.87–5.11)
RDW: 12.6 % (ref 11.5–15.5)
WBC: 11.2 10*3/uL — ABNORMAL HIGH (ref 4.0–10.5)
nRBC: 0 % (ref 0.0–0.2)

## 2023-09-28 LAB — PREGNANCY, URINE: Preg Test, Ur: NEGATIVE

## 2023-09-28 MED ORDER — ONDANSETRON HCL 4 MG/2ML IJ SOLN
4.0000 mg | Freq: Once | INTRAMUSCULAR | Status: AC
  Administered 2023-09-28: 4 mg via INTRAVENOUS
  Filled 2023-09-28: qty 2

## 2023-09-28 MED ORDER — LIDOCAINE 5 % EX PTCH: 1.0000 | MEDICATED_PATCH | CUTANEOUS | 0 refills | Status: DC

## 2023-09-28 MED ORDER — MORPHINE SULFATE (PF) 4 MG/ML IV SOLN
4.0000 mg | Freq: Once | INTRAVENOUS | Status: AC
  Administered 2023-09-28: 4 mg via INTRAVENOUS
  Filled 2023-09-28: qty 1

## 2023-09-28 MED ORDER — GABAPENTIN 100 MG PO CAPS: 100.0000 mg | ORAL_CAPSULE | Freq: Three times a day (TID) | ORAL | 0 refills | Status: DC

## 2023-09-28 MED ORDER — GABAPENTIN 300 MG PO CAPS
300.0000 mg | ORAL_CAPSULE | Freq: Once | ORAL | Status: AC
  Administered 2023-09-28: 300 mg via ORAL
  Filled 2023-09-28: qty 1

## 2023-09-28 MED ORDER — POTASSIUM CHLORIDE CRYS ER 20 MEQ PO TBCR
20.0000 meq | EXTENDED_RELEASE_TABLET | Freq: Once | ORAL | Status: DC
  Filled 2023-09-28: qty 1

## 2023-09-28 MED ORDER — ACETAMINOPHEN 500 MG PO TABS: 1000.0000 mg | ORAL_TABLET | Freq: Once | ORAL | Status: DC

## 2023-09-28 MED ORDER — ONDANSETRON HCL 4 MG/2ML IJ SOLN
4.0000 mg | Freq: Once | INTRAMUSCULAR | Status: AC
Start: 2023-09-28 — End: 2023-09-28
  Administered 2023-09-28: 4 mg via INTRAVENOUS
  Filled 2023-09-28: qty 2

## 2023-09-28 MED ORDER — LORAZEPAM 1 MG PO TABS
0.5000 mg | ORAL_TABLET | Freq: Once | ORAL | Status: AC | PRN
  Administered 2023-09-28: 0.5 mg via ORAL
  Filled 2023-09-28: qty 1

## 2023-09-28 MED ORDER — OXYCODONE-ACETAMINOPHEN 5-325 MG PO TABS
1.0000 | ORAL_TABLET | Freq: Once | ORAL | Status: DC
  Filled 2023-09-28: qty 1

## 2023-09-28 MED ORDER — METHYLPREDNISOLONE SODIUM SUCC 40 MG IJ SOLR
40.0000 mg | Freq: Once | INTRAMUSCULAR | Status: AC
  Administered 2023-09-28: 40 mg via INTRAVENOUS
  Filled 2023-09-28: qty 1

## 2023-09-28 MED ORDER — HYDROMORPHONE HCL 1 MG/ML IJ SOLN
1.0000 mg | Freq: Once | INTRAMUSCULAR | Status: AC
  Administered 2023-09-28: 1 mg via INTRAVENOUS
  Filled 2023-09-28: qty 1

## 2023-09-28 MED ORDER — HYDROCODONE-ACETAMINOPHEN 5-325 MG PO TABS: 1.0000 | ORAL_TABLET | Freq: Four times a day (QID) | ORAL | 0 refills | Status: DC | PRN

## 2023-09-28 MED ORDER — PREDNISONE 10 MG PO TABS: ORAL_TABLET | ORAL | 0 refills | Status: DC

## 2023-09-28 NOTE — ED Notes (Addendum)
 Pt transferred from [place] for further work-up and MRI.

## 2023-09-28 NOTE — Hospital Course (Addendum)
 Diane [name] is a 43 y.o. female with a medical history of ADD, Chron's, severe right ICA stenosis, and recent ED visit for pneumonia who presents for left leg pain. She appears HDS. Patient's son is at bedside.    Patient reports left leg pain began with a slow onset yesterday evening and became unbearable around 10:30 PM to the point where she was woken up by it. Slept an hour or two. Pain radiates through the left thigh and down to the back of the leg as well as her left buttocks. Pain is exacerbated by movement. Experienced nausea, feeling hot, headache, and possibly some shortness of breath when pain first began but denies any now. She took Tylenol  as well as a muscle relaxant (doesn't recall name) with no improvement. Endorses difficulty standing, bearing weight on the left leg, and ambulating. Denies recent injury, strain, repetitive movement, or long travel as well as incontinence or numbness. No prior fractures or procedures on LLE. She does endorse being sedentary at work as well as less active since her pneumonia diagnosis. Medications since her 12/21 ED visit include Augmentin , amlodipine  5 mg daily, and aspirin  81 mg daily. Pain is currently 10/10 with a throbbing quality.    Of note patient does have a family history of saddle DVT in her mother (March 2024). Reports her D-dimer is always abnormal. Does not have a personal history of clotting or blood disorder. NKDA. Has taken opioids for pain medication in the past, prefers IV to oral if possible.    Patient's recorded medical, surgical, social, medication list and allergies were reviewed in the Snapshot window as part of the initial history.   General: Appears to be in slight distress, trying out different positions in bed to get comfortable.  CV: RRR, no r/m/g Pulmonary: Normal respiratory effort on room air Abdominal: positive bowel sounds on auscultation, soft, non-tender, non-distended MSK: Patient is able to move both LE  spontaneously although LLE movement is limited by pain. Sensation is intact. Patient's pedal and popliteal pulses are difficult to palpate bilaterally. No localized edema or erythema noted.  Psych: Normal mood and affect.   Notably the patient was cooperative throughout the exam, HDS, and exhibited signs of pain/discomfort with frequent position change in the bed.   Well's score 2, family history of saddle thrombosis, and history of severe aortic stenosis and hypertension concerning for unprovoked lower extremity clotting. Pain could be muscular as it is reproducible with touch, although patient experienced no improvement with pain medication and muscle relaxant. No inciting trauma makes radiculopathy less likely.

## 2023-09-28 NOTE — ED Notes (Signed)
 Administrative Coordinator at bedside speaking to patient.

## 2023-09-28 NOTE — ED Notes (Signed)
 Called x 3 attempts to give report to ED Charge.

## 2023-09-28 NOTE — ED Notes (Signed)
 PT extremely upset about discharge disposition. AC called to speak with them for a 2nd time

## 2023-09-28 NOTE — ED Notes (Signed)
 Called [facility] for transport to [facility] ED [facility] is accepting at 10:37a  spoke with

## 2023-09-28 NOTE — ED Provider Notes (Signed)
 [name] Provider Note   CSN: [id number] Arrival date & time: 09/28/23  9367     History Chief Complaint  Patient presents with   Leg Pain    Leg Pain    Diane [name] is a 43 y.o. female with a medical history of ADD, Chron's, severe right ICA stenosis, and recent ED visit for pneumonia who presents for left leg pain. She appears HDS. Patient's son is at bedside.   Patient reports left leg pain began with a slow onset yesterday evening and became unbearable around 10:30 PM to the point where she was woken up by it. Slept an hour or two. Pain radiates through the left thigh and down to the back of the leg as well as her left buttocks. Pain is exacerbated by movement. Experienced nausea, feeling hot, headache, and possibly some shortness of breath when pain first began but denies any now. She took Tylenol  as well as a muscle relaxant (doesn't recall name) with no improvement. Endorses difficulty standing, bearing weight on the left leg, and ambulating. Denies recent injury, strain, repetitive movement, or long travel as well as incontinence or numbness. No prior fractures or procedures on LLE. She does endorse being sedentary at work as well as less active since her pneumonia diagnosis. Medications since her 12/21 ED visit include Augmentin , amlodipine  5 mg daily, and aspirin  81 mg daily. Pain is currently 10/10 with a throbbing quality.   Of note patient does have a family history of saddle DVT in her mother (March 2024). Reports her D-dimer is always abnormal. Does not have a personal history of clotting or blood disorder. NKDA. Has taken opioids for pain medication in the past, prefers IV to oral if possible.   Patient's recorded medical, surgical, social, medication list and allergies were reviewed in the Snapshot window as part of the initial history.   Review of Systems   Review of Systems  Physical Exam Updated Vital Signs BP  120/79 (BP Location: Right Arm)   Pulse (!) 107   Temp 98.4 F (36.9 C) (Oral)   Resp 16   Ht 5' 7 (1.702 m)   Wt 86.2 kg   LMP 09/04/2023 (Exact Date)   SpO2 96%   BMI 29.76 kg/m  Physical Exam General: Patient resting in bed in no acute distress, does appear to be in pain and repositions herself often.  CV: RRR, no r/m/g. Pulmonary: Normal respiratory effort on room air. Abdominal: Soft, non-tender, non-distended.  Neuro: Patient alert to self, date, place, and situation. CN I-XII intact. Bilateral upper extremity strength 5/5. RLE strength 5/5, LLE strength limited by pain although bilateral dorsiflexion and plantarflexion 5/5 strength.  MSK: Patient moving all limbs spontaneously, left lower extremity movement and strength test limited by pain. Bilateral pedal and popliteal pulses intact.  Skin: Warm and dry. No rashes, petechiae, or ecchymoses observed on BLE.  Psych: Normal mood and affect.  ED Course/ Medical Decision Making/ A&P    Procedures Procedures   Medications Ordered in ED Medications  morphine  (PF) 4 MG/ML injection 4 mg (has no administration in time range)  HYDROmorphone  (DILAUDID ) injection 1 mg (1 mg Intravenous Given 09/28/23 0850)  ondansetron  (ZOFRAN ) injection 4 mg (4 mg Intravenous Given 09/28/23 0858)    Medical Decision Making:    Diane [name] is a 43 y.o. female who presented to the ED today with left leg pain detailed above.     Patient's presentation is complicated by  their history of severe aortic stenosis, recent illness increasing amount of sedentary time, and family history of DVT.   Complete initial physical exam performed, notably the patient was intermittently tachycardic otherwise HDS, appeared uncomfortable while resting in bed and endorsed worsening in LE pain with movement. Difficult to assess left leg strength fully due to pain. No other focal neurological deficits noted on exam. Endorsed some lightheadedness with standing. Pain only  slightly improved following IV dilaudid  1g 1x from 10/10 to 8/10. Pain reportedly back to 10/10.    Reviewed and confirmed nursing documentation for past medical history, family history, social history.    Initial Assessment:   With the patient's presentation of left leg pain, differential includes muscular pain vs lumbar/spine pathology. Other diagnoses were considered including (but not limited to) DVT and stroke due to patient's significant CV and family history. These are considered less likely due to physical exam findings and negative LE DVT US .    Initial Plan:   Screening labs including CBC and Metabolic panel to evaluate for infectious or metabolic etiology of disease.  Pain medication regimen including IV dilaudid  1 mg and IV morphine  4 mg.  IV Zofran  x 1 for nausea. LE US  DVT. PO potassium repletion.  Objective evaluation as below reviewed with plan for close reassessment.  Initial Study Results:   Laboratory  All laboratory results reviewed without evidence of clinically relevant pathology.   CBC    Latest Ref Rng & Units 09/28/2023    8:46 AM 09/14/2023    9:32 PM 09/13/2023    2:15 AM  CBC  WBC 4.0 - 10.5 K/uL 11.2  7.8  8.4   Hemoglobin 12.0 - 15.0 g/dL 87.9  87.6  88.5   Hematocrit 36.0 - 46.0 % 35.1  35.6  32.3   Platelets 150 - 400 K/uL 345  332  276       Latest Ref Rng & Units 09/28/2023    8:46 AM 09/14/2023    9:32 PM 09/13/2023    4:17 AM  BMP  Glucose 70 - 99 mg/dL 889  97    BUN 6 - 20 mg/dL 15  7    Creatinine 9.55 - 1.00 mg/dL 9.27  9.24    Sodium 864 - 145 mmol/L 136  137    Potassium 3.5 - 5.1 mmol/L 3.4  2.9  2.8   Chloride 98 - 111 mmol/L 104  100    CO2 22 - 32 mmol/L 24  26    Calcium  8.9 - 10.3 mg/dL 8.7  8.6     Radiology  All images reviewed independently. Agree with radiology report at this time.   US  Venous Img Lower  Left (DVT Study) Result Date: 09/28/2023 CLINICAL DATA:  Left thigh pain for 1 day. EXAM: LEFT LOWER EXTREMITY VENOUS  DOPPLER ULTRASOUND TECHNIQUE: Gray-scale sonography with compression, as well as color and duplex ultrasound, were performed to evaluate the deep venous system(s) from the level of the common femoral vein through the popliteal and proximal calf veins. COMPARISON:  None Available. FINDINGS: VENOUS Normal compressibility of the common femoral, superficial femoral, and popliteal veins, as well as the visualized calf veins. Visualized portions of profunda femoral vein and great saphenous vein unremarkable. No filling defects to suggest DVT on grayscale or color Doppler imaging. Doppler waveforms show normal direction of venous flow, normal respiratory plasticity and response to augmentation. Limited views of the contralateral common femoral vein are unremarkable. OTHER None. Limitations: none IMPRESSION: Negative. Electronically Signed  By: Norman Hopper M.D.   On: 09/28/2023 09:40    Consults:  N/A.  Reassessment and Plan:   Patient is a 45 F with a medical history of ADD, Chron's, severe right ICA stenosis, and recently treated pneumonia who presented for left leg pain concerning for LE DVT or spine related process. Patient was noted to have slightly low K (3.4), but patient refused PO potassium stating it makes her feel sick. States she has chronically low potassium and 3.4 is improved from her typical baseline ~3.2.  Initial physical exam was limited by pain. Repeat physical exam after pain medication was administered with only slight improvement in ability to participate in strength exam. Patient demonstrated plantarflexion and dorsiflexion against resistance (5/5 both sides) but was unable to lift her left leg against gravity without assistance. Per patient, difficult to tell if her difficulty lifting her leg is due to fear of pain or weakness. No focal deficits noted on neurological exam. LE VAS US  negative for DVT.   At this time, the most likely cause of LE pain is a spinal process. Will plan on  transporting patient to [facility] for further imaging (MRI lumbar spine) to help determine if there is an acute process that requires further intervention. Patient denies having metal hardware. Does endorse needing some medication to help relax during MRI, denies any issues with Ativan  in the past. Will give patient IV morphine  prior to discharge and transport.    Clinical Impression:  1. Left leg pain      Discharge   Final Clinical Impression(s) / ED Diagnoses Final diagnoses:  Left leg pain    Rx / DC Orders ED Discharge Orders     None        Arellano Zameza, Zaydon Kinser, MD 09/28/23 1057

## 2023-09-28 NOTE — ED Provider Notes (Addendum)
 Transferred from med [facility] by Dr. Arellano Zameza. Please see prior provider note for more detail.   Briefly: Patient is 43 y.o. presenting for left leg pain.  DDX: concern for DVT, radiculopathy, cauda equina syndrome, other  Plan: Follow-up on MRI and ultrasound and reassess.  Physical Exam  BP (!) 149/96   Pulse (!) 114   Temp 99.7 F (37.6 C) (Oral)   Resp 13   Ht 5' 7 (1.702 m)   Wt 86.2 kg   LMP 09/04/2023 (Exact Date)   SpO2 100%   BMI 29.76 kg/m   Physical Exam  Procedures  Procedures  ED Course / MDM    Medical Decision Making Amount and/or Complexity of Data Reviewed Labs: ordered. Radiology: ordered.  Risk OTC drugs. Prescription drug management.    MRI of the lumbar spine revealed mild narrowing of the left lateral recess at L5-S1 with moderate left neuroforaminal narrowing.  Finding could potentially explain her symptoms.  Advised her to follow-up with neurosurgery. X-ray here was negative.  DVT study was also negative.  On reassessment patient pain had improved but still significant.  I did offer Norco but patient refused p.o. medication as stated in it did not work.  She was adamant to receive IV medications.  Subsequently treated her with IV morphine  and Zofran .  On tertiary reassessment stated that her pain was still about the same and that she wanted something stronger and also stated that she had received Dilaudid  which helped much more. Planning to send her home with a few tablets of Norco for acute pain in the next couple days but also advised conservative measures along with NSAIDs. Patient remained well-appearing, no acute distress and hemodynamically stable.       Lang Norleen POUR, PA-C 09/28/23 1935    Lang Norleen POUR, PA-C 09/28/23 KENITH Jerral Meth, MD 09/29/23 [phone]

## 2023-09-28 NOTE — ED Triage Notes (Signed)
 Pt states left leg pain radiates up back and down leg. Denies Hx or injury. Unable to bear weight or move.

## 2023-09-28 NOTE — Discharge Instructions (Addendum)
 Workup today revealed that you may have some narrowing in your lumbar spine which could be causing your symptoms.  Recommend you do follow-up with neurosurgery.  In the meantime I am sending a few tablets of Norco for acute pain to your pharmacy.  Also recommend ibuprofen  and Tylenol .  You have any new trauma to the left leg, develop calf tenderness, chest pain or shortness of breath, worsening pain, numbness or weakness or any other concerning symptom please return emergency department further evaluation.

## 2023-09-29 ENCOUNTER — Encounter (HOSPITAL_COMMUNITY): Payer: Self-pay

## 2023-09-29 NOTE — H&P (Signed)
 Chief Complaint: Right sided headache. Found to have right ICA stenosis on MRI. Request is for diagnostic cerebral angiogram    Supervising Physician: Dolphus Carrion  Patient Status: [facility] - Out-pt  History of Present Illness: Diane [name] is a 43 y.o. female Outpatient. History of Crohn's, endometriosis. Presented to the ED at [facility] on 12.20.24 with right sided headache and hypertension. X 3- 4 days, Found to have severe intracranial carotid arterial stenosis. MR Angio Head from 12.20.24 reads  Severe stenosis of the right ICA at the skull base, worst in the proximal lacerum segment and the distal cavernous segment. Etiology for this narrowing is unclear and the age is indeterminate. Patient was referred to [facility]. Patient presents for  diagnostic cerebral angiogram.  Patient alert and laying in bed,calm. Sates that her lower back /left hip pain from this weekend has improved. Denies any fevers, headache, chest pain, SOB, cough, abdominal pain, nausea, vomiting or bleeding.   Patient is on 81 mg of ASA.  All labs are within acceptable parameters.   Return precautions and treatment recommendations and follow-up discussed with the patient  who is agreeable with the plan.    Past Medical History:  Diagnosis Date   ADD (attention deficit disorder)    Anemia    Anxiety    Crohn's disease (HCC)    D-dimer, elevated    Fibroid    Headache(784.0)    otc meds - last migraine 2009   Heart murmur    as child, never had any problems   History of endometriosis    Leiomyoma 02/09/2015   Low blood potassium    history   Migraine without aura, with intractable migraine, so stated, without mention of status migrainosus 06/19/2013   Pneumonia    history back in 2006   PONV (postoperative nausea and vomiting)    Seasonal allergies    Vaginal Pap smear, abnormal    Vasovagal syncope     Past Surgical History:  Procedure Laterality Date   ADENOIDECTOMY     CESAREAN  SECTION N/A 04/14/2018   Procedure: PRIMARY CESAREAN SECTION;  Surgeon: Edsel Norleen GAILS, MD;  Location: [facility] BIRTHING SUITES;  Service: Obstetrics;  Laterality: N/A;   DILATATION & CURRETTAGE/HYSTEROSCOPY WITH RESECTOCOPE N/A 06/09/2013   Procedure: DILATATION & CURETTAGE/HYSTEROSCOPY WITH HYSTEROSCOPIC RESECTION OF SUBMUCOSAL FIBROID AND ENDOMETRIAL POLYP;  Surgeon: Dickie DELENA Carder, MD;  Location: [facility];  Service: Gynecology;  Laterality: N/A;   IR RADIOLOGIST EVAL & MGMT  09/30/2023   LAPAROSCOPY N/A 06/09/2013   Procedure: LAPAROSCOPY DIAGNOSTIC;  Surgeon: Dickie DELENA Carder, MD;  Location: [facility];  Service: Gynecology;  Laterality: N/A;   ROBOT ASSISTED MYOMECTOMY N/A 02/09/2015   Procedure: MYOMECTOMY,EXCISION OF ENDOMETRIOSIS,PRE-SACRAL NEURECTOMY;  Surgeon: Cynthia Loss, MD;  Location: [facility];  Service: Gynecology;  Laterality: N/A;   ROBOTIC ASSISTED LAPAROSCOPIC LYSIS OF ADHESION N/A 06/09/2013   Procedure: ROBOTIC ASSISTED LAPAROSCOPIC LYSIS OF ADHESION; Resection of Endometriosis and excision of fibroid;  Surgeon: Dickie DELENA Carder, MD;  Location: [facility];  Service: Gynecology;  Laterality: N/A;   TONSILLECTOMY     TYMPANOSTOMY TUBE PLACEMENT      Allergies: Patient has no known allergies.  Medications: Prior to Admission medications   Medication Sig Start Date End Date Taking? Authorizing Provider  acetaminophen  (TYLENOL ) 325 MG tablet Take 2 tablets (650 mg total) by mouth every 4 (four) hours as needed (for pain scale < 4). 04/17/18   Izell Harari, MD  amLODipine  (NORVASC ) 5 MG  tablet Take 1 tablet (5 mg total) by mouth daily. 09/15/23   Kommor, Madison, MD  amoxicillin -clavulanate (AUGMENTIN ) 875-125 MG tablet Take 1 tablet by mouth every 12 (twelve) hours. 09/14/23   Kommor, Madison, MD  amphetamine-dextroamphetamine (ADDERALL) 30 MG tablet Take 30 mg by mouth daily.    [provider]  aspirin  81 MG chewable tablet Chew 1 tablet (81 mg total) by mouth  daily. 09/13/23   Rancour, Garnette, MD  cetirizine  (ZYRTEC  ALLERGY) 10 MG tablet Take 1 tablet (10 mg total) by mouth 2 (two) times daily. 08/11/22 09/13/24  Joesph Shaver Scales, PA-C  gabapentin  (NEURONTIN ) 100 MG capsule Take 1 capsule (100 mg total) by mouth 3 (three) times daily. 09/28/23   Robinson, John K, PA-C  HYDROcodone -acetaminophen  (NORCO/VICODIN) 5-325 MG tablet Take 1 tablet by mouth every 6 (six) hours as needed. 09/28/23   Robinson, John K, PA-C  lidocaine  (LIDODERM ) 5 % Place 1 patch onto the skin daily. Remove & Discard patch within 12 hours or as directed by MD 09/28/23   Jerral Meth, MD  ondansetron  (ZOFRAN -ODT) 4 MG disintegrating tablet Take 1 tablet (4 mg total) by mouth every 8 (eight) hours as needed for nausea or vomiting. 09/15/23   Kommor, Madison, MD  potassium chloride  SA (KLOR-CON  M) 20 MEQ tablet Take 1 tablet (20 mEq total) by mouth 2 (two) times daily. 09/13/23   Rancour, Garnette, MD  predniSONE  (DELTASONE ) 10 MG tablet Take 5 tablets (50 mg total) by mouth daily for 3 days, THEN 4 tablets (40 mg total) daily for 3 days, THEN 3 tablets (30 mg total) daily for 3 days, THEN 2 tablets (20 mg total) daily for 3 days, THEN 1 tablet (10 mg total) daily for 3 days, THEN 0.5 tablets (5 mg total) daily for 3 days. 09/28/23 10/16/23  Jerral Meth, MD     Family History  Problem Relation Age of Onset   Heart disease Mother 10       CHF   Crohn's disease Mother    Asthma Mother    Asthma Brother    Heart disease Maternal Aunt    Diabetes Maternal Aunt    Stroke Maternal Aunt    Heart disease Maternal Grandfather    Stroke Maternal Grandfather     Social History   Socioeconomic History   Marital status: Divorced    Spouse name: Not on file   Number of children: 2   Years of education: some coll.   Highest education level: Not on file  Occupational History   Not on file  Tobacco Use   Smoking status: Never   Smokeless tobacco: Never  Vaping Use   Vaping  status: Never Used  Substance and Sexual Activity   Alcohol use: No   Drug use: No   Sexual activity: Yes    Birth control/protection: None  Other Topics Concern   Not on file  Social History Narrative   Patient does not drink caffeine.   Patient is right handed.    Social Drivers of Corporate Investment Banker Strain: Not on file  Food Insecurity: Not on file  Transportation Needs: Not on file  Physical Activity: Not on file  Stress: Not on file  Social Connections: Not on file      Review of Systems: A 12 point ROS discussed and pertinent positives are indicated in the HPI above.  All other systems are negative.  Review of Systems  Constitutional:  Negative for fatigue and fever.  HENT:  Negative for congestion.   Respiratory:  Negative for cough and shortness of breath.   Gastrointestinal:  Negative for abdominal pain, diarrhea, nausea and vomiting.  Musculoskeletal:  Positive for arthralgias (left hip/ lower back pain improved).    Vital Signs: BP 137/87   Pulse (!) 105   Temp 98.7 F (37.1 C) (Oral)   Resp 16   Ht 5' 7.75 (1.721 m)   Wt 192 lb (87.1 kg)   LMP 09/04/2023 (Exact Date)   SpO2 97%   BMI 29.41 kg/m     Physical Exam Vitals and nursing note reviewed.  Constitutional:      Appearance: She is well-developed.  HENT:     Head: Normocephalic and atraumatic.     Mouth/Throat:     Mouth: Mucous membranes are dry.  Eyes:     Conjunctiva/sclera: Conjunctivae normal.  Cardiovascular:     Rate and Rhythm: Regular rhythm. Tachycardia present.  Pulmonary:     Effort: Pulmonary effort is normal.  Musculoskeletal:        General: Normal range of motion.     Cervical back: Normal range of motion.  Skin:    General: Skin is warm and dry.  Neurological:     General: No focal deficit present.     Mental Status: She is alert and oriented to person, place, and time.  Psychiatric:        Mood and Affect: Mood normal.        Behavior: Behavior  normal.        Thought Content: Thought content normal.        Judgment: Judgment normal.     Imaging: IR Radiologist Eval & Mgmt Result Date: 09/30/2023 EXAM: NEW PATIENT OFFICE VISIT CHIEF COMPLAINT: Abnormal CT angiogram of the head and neck revealing high-grade right ICA intracranial stenosis. Current Pain Level: 1-10 HISTORY OF PRESENT ILLNESS: The patient is a 43 year old right handed lady who has been referred for further management of a recently discovered severely stenotic right ICA intracranial segment. During workup for a severe headache and stroke on 09/13/2023, the patient underwent a CT angiogram of the head and neck which revealed severe stenosis of the intracranial portion of the right internal carotid artery. Patient describes her headache at the time being sharp and stabbing with intermittent character. Was associated with photophobia and nausea but no vomiting. There was no associated symptoms of vertigo, paresthesias of the extremities, speech difficulties, or motor weakness. Patient denies a history of loss of awareness, or seizure-like activity. The patient denies having further episodes of severe right-sided headache since then. Denies history of seizures, or of vertigo, tinnitus, or gait imbalance, or difficulty swallowing liquids or solids. She denies symptoms of diplopia, amaurosis fugax, or of blindness. The patient reports no recent chest pain, shortness of breath or palpitations or pedal edema. Patient reports a recent history of coughing and occasional sputum production. She reports being treated for a right-sided pneumonia with antibiotics. Presently, however, she has no cough or sputum production or hemoptysis or wheezing. Appetite normal. Weight is steady. Denies any abdominal pains, constipation, diarrhea or melena. Denies any dysuria frequency of micturition, or hematuria. No recent chills, fever or rigors. Diagnosis * : Date . * : ADD (attention deficit disorder) * : . * :  Anemia * : . * : Anxiety * : . * : Crohn's disease (HCC) * : . * : Fibroid * : . * : H/O blood clots * : * : tested positive but not  located on doppler . * : Headache(784.0) * : * : otc meds - last migraine 2009 . * : Heart murmur * : * : as child, never had any problems . * : History of endometriosis * : . * : Leiomyoma * : 02/09/2015 . * : Low blood potassium * : * : history . * : Migraine without aura, with intractable migraine, so stated, without mention of status migrainosus * : 06/19/2013 . * : Pneumonia * : * : history back in 2006 . * : PONV (postoperative nausea and vomiting) * : . * : Seasonal allergies * : . * : Vaginal Pap smear, abnormal * : . * : Vasovagal syncope * : : Patient Active Problem List * : Diagnosis * : Date Noted . * : Cervical dysplasia * : 11/30/2020 . * : Dysmenorrhea * : 05/19/2019 . * : H/O myomectomy * : 01/21/2018 . * : Low back pain radiating to left leg * : 10/04/2015 . * : Intractable migraine without aura * : 06/19/2013 . * : Vertigo * : 06/13/2013 . * : Headache * : 06/13/2013 . * : History of endometriosis * : 06/13/2013 . * : CROHN'S DISEASE-LARGE & SMALL INTESTINE * : 07/27/2009 . * : GASTROESOPHAGEAL REFLUX DISEASE, MILD * : 07/12/2008 Past Surgical History: Procedure * : Laterality * : Date . * : ADENOIDECTOMY * : * : . * : CESAREAN SECTION * : N/A * : 04/14/2018 * : Procedure: PRIMARY CESAREAN SECTION; Surgeon: Edsel Norleen GAILS, MD; Location: [facility] BIRTHING SUITES; Service: Obstetrics; Laterality: N/A; . * : DILATATION & CURRETTAGE/HYSTEROSCOPY WITH RESECTOCOPE * : N/A * : 06/09/2013 * : Procedure: DILATATION & CURETTAGE/HYSTEROSCOPY WITH HYSTEROSCOPIC RESECTION OF SUBMUCOSAL FIBROID AND ENDOMETRIAL POLYP; Surgeon: Dickie DELENA Carder, MD; Location: [facility]; Service: Gynecology; Laterality: N/A; . * : LAPAROSCOPY * : N/A * : 06/09/2013 * : Procedure: LAPAROSCOPY DIAGNOSTIC; Surgeon: Dickie DELENA Carder, MD; Location: [facility]; Service: Gynecology; Laterality: N/A; . * : ROBOT  ASSISTED MYOMECTOMY * : N/A * : 02/09/2015 * : Procedure: MYOMECTOMY,EXCISION OF ENDOMETRIOSIS,PRE-SACRAL NEURECTOMY; Surgeon: Cynthia Loss, MD; Location: [facility]; Service: Gynecology; Laterality: N/A; . * : ROBOTIC ASSISTED LAPAROSCOPIC LYSIS OF ADHESION * : N/A * : 06/09/2013 * : Procedure: ROBOTIC ASSISTED LAPAROSCOPIC LYSIS OF ADHESION; Resection of Endometriosis and excision of fibroid; Surgeon: Dickie DELENA Carder, MD; Location: [facility]; Service: Gynecology; Laterality: N/A; . * : TONSILLECTOMY * : * : . * : TYMPANOSTOMY TUBE PLACEMENT * : * : Home Medications Prior to Admission medications Medication * : Sig * : Start Date * : End Date * : Taking? * : Authorizing Provider acetaminophen  (TYLENOL ) 325 MG tablet * : Take 2 tablets (650 mg total) by mouth every 4 (four) hours as needed (for pain scale < 4). * : 04/17/18 * : * : * BETHA Izell Harari, MD amphetamine-dextroamphetamine (ADDERALL) 30 MG tablet * : Take 30 mg by mouth daily. * : * : * : * : [provider] cetirizine  (ZYRTEC  ALLERGY) 10 MG tablet * : Take 1 tablet (10 mg total) by mouth 2 (two) times daily. * : 08/11/22 * : 02/07/23 * : * : Joesph Shaver Scales, PA-C fexofenadine  (ALLEGRA ) 180 MG tablet * : Take 1 tablet (180 mg total) by mouth daily. * : 08/11/22 * : 02/07/23 * : * : Joesph Shaver Scales, PA-C ibuprofen  (ADVIL ) 400 MG tablet * : Take 1 tablet (400 mg total) by mouth every 8 (  eight) hours as needed for up to 30 doses. * : 08/11/22 * : * : * : Joesph Shaver Scales, PA-C naproxen  (NAPROSYN ) 500 MG tablet * : Take 1 tablet (500 mg total) by mouth 2 (two) times daily. * : 08/29/22 * : * : * : Joesph Shaver Scales, PA-C Allergies           Patient has no known allergies. Family History Family History Problem * : Relation * : Age of Onset . * : Heart disease * : Mother * : 23 * :     CHF . * : Crohn's disease * : Mother * : . * : Asthma * : Mother * : . * : Asthma * : Brother * : . * : Heart disease * : Maternal Aunt * : . * :  Diabetes * : Maternal Aunt * : . * : Stroke * : Maternal Aunt * : . * : Heart disease * : Maternal Grandfather * : . * : Stroke * : Maternal Grandfather * : Social History Tobacco Use.Smoking status:Never.Smokeless tobacco:NeverVaping Use.Vaping Ldz:Wzczm usedSubstance Use Topics.Alcohol use:No.Drug use:No REVIEW OF SYSTEMS: Negative unless as mentioned above. PHYSICAL EXAMINATION: Alert, awake, oriented to time, place, space. Speech and comprehension intact. Normal eye contact. Grossly no abnormal lateralizing neurological features evident. Station and gait normal. ASSESSMENT AND PLAN: The patient's CT angiogram of the head and neck depicting the high-grade right ICA stenosis was shared with her. Brought to her attention was the intracranial nature of the high-grade stenosis. In order to more accurately evaluate the high-grade stenosis, a formal diagnostic catheter arteriogram via the trans radial or transfemoral route was discussed with the patient. The procedure, would be under conscious sedation. Following the procedure the results will be shared with patient with a further management plan depending on the diagnostic study. A small risk of ischemic stroke one in a thousand was reviewed with patient. Patient expressed understanding and agreement with the above management plan. Diagnostic arteriogram will be scheduled as soon as possible. Patient advised to maintain adequate hydration. Should she develop stroke-like symptoms, patient advised to call [phone]. Electronically Signed   By: Thyra Nash M.D.   On: 09/30/2023 08:11   DG Femur 1V Left Result Date: 09/28/2023 CLINICAL DATA:  Pain EXAM: LEFT FEMUR 1 VIEW COMPARISON:  Same day pelvic radiographs. FINDINGS: There is no evidence of fracture or other focal bone lesions. Soft tissues are unremarkable. IMPRESSION: Negative. Electronically Signed   By: Norman Hopper M.D.   On: 09/28/2023 18:47   DG Pelvis 1-2 Views Result Date: 09/28/2023 CLINICAL DATA:   Pain EXAM: PELVIS - 1-2 VIEW COMPARISON:  Same day femur radiographs. FINDINGS: There is no evidence of pelvic fracture or diastasis. No pelvic bone lesions are seen. IMPRESSION: Negative. Electronically Signed   By: Norman Hopper M.D.   On: 09/28/2023 18:46   MR LUMBAR SPINE WO CONTRAST Result Date: 09/28/2023 CLINICAL DATA:  Low back pain, no red flags, no prior management Left lower extremity with severe pain and possible weakness EXAM: MRI LUMBAR SPINE WITHOUT CONTRAST TECHNIQUE: Multiplanar, multisequence MR imaging of the lumbar spine was performed. No intravenous contrast was administered. COMPARISON:  None Available. FINDINGS: Segmentation:  Standard. Alignment:  Physiologic. Vertebrae:  No fracture, evidence of discitis, or bone lesion. Conus medullaris and cauda equina: Conus extends to the L2 level. Conus and cauda equina appear normal. Paraspinal and other soft tissues: Small bilateral T2 hyperintense renal lesions are too small to accurately  characterize, but likely represent simple renal cysts requiring no further imaging workup. Disc levels: T12-L1: Unremarkable L1-L2: Unremarkable L2-L3: Mild bilateral facet degenerative change. Minimal disc bulge. No significant spinal canal narrowing. No neural foraminal narrowing. L3-L4: Unremarkable L4-L5: Mild bilateral facet degenerative change, left-greater-than-right. Minimal disc bulge. No spinal canal narrowing. No neural foraminal narrowing. L5-S1: Circumferential disc bulge. No significant spinal canal narrowing. There is mild narrowing of the left lateral recess. Mild right and moderate left neural foraminal narrowing. IMPRESSION: 1. No acute abnormality. 2. Mild narrowing of the left lateral recess at L5-S1 with moderate left neuroforaminal narrowing. 3. No significant spinal canal narrowing. Electronically Signed   By: Lyndall Gore M.D.   On: 09/28/2023 15:23   US  Venous Img Lower  Left (DVT Study) Result Date: 09/28/2023 CLINICAL DATA:  Left  thigh pain for 1 day. EXAM: LEFT LOWER EXTREMITY VENOUS DOPPLER ULTRASOUND TECHNIQUE: Gray-scale sonography with compression, as well as color and duplex ultrasound, were performed to evaluate the deep venous system(s) from the level of the common femoral vein through the popliteal and proximal calf veins. COMPARISON:  None Available. FINDINGS: VENOUS Normal compressibility of the common femoral, superficial femoral, and popliteal veins, as well as the visualized calf veins. Visualized portions of profunda femoral vein and great saphenous vein unremarkable. No filling defects to suggest DVT on grayscale or color Doppler imaging. Doppler waveforms show normal direction of venous flow, normal respiratory plasticity and response to augmentation. Limited views of the contralateral common femoral vein are unremarkable. OTHER None. Limitations: none IMPRESSION: Negative. Electronically Signed   By: Norman Hopper M.D.   On: 09/28/2023 09:40   DG Chest 2 View Result Date: 09/14/2023 CLINICAL DATA:  Chest pain EXAM: CHEST - 2 VIEW COMPARISON:  09/13/2023. FINDINGS: Prominent cardiac silhouette. No pneumothorax or pleural effusion. Normal pulmonary vasculature. Small focus of alveolar opacity medial right base consistent with volume loss, pneumonitis or lesion which should be followed up to ensure resolution as a precaution. Osseous structures are intact. IMPRESSION: Small focus of opacity medial right base. Follow-up recommended. Electronically Signed   By: Fonda Field M.D.   On: 09/14/2023 22:31   DG Chest 2 View Result Date: 09/13/2023 CLINICAL DATA:  Chest pain EXAM: CHEST - 2 VIEW COMPARISON:  01/05/2022 FINDINGS: The heart size and mediastinal contours are within normal limits. Both lungs are clear. The visualized skeletal structures are unremarkable. IMPRESSION: No active cardiopulmonary disease. Electronically Signed   By: Franky Stanford M.D.   On: 09/13/2023 04:00   CT ANGIO HEAD NECK W WO CM Result  Date: 09/13/2023 CLINICAL DATA:  Headache and elevated blood pressure EXAM: CT ANGIOGRAPHY HEAD AND NECK WITH AND WITHOUT CONTRAST TECHNIQUE: Multidetector CT imaging of the head and neck was performed using the standard protocol during bolus administration of intravenous contrast. Multiplanar CT image reconstructions and MIPs were obtained to evaluate the vascular anatomy. Carotid stenosis measurements (when applicable) are obtained utilizing NASCET criteria, using the distal internal carotid diameter as the denominator. RADIATION DOSE REDUCTION: This exam was performed according to the departmental dose-optimization program which includes automated exposure control, adjustment of the mA and/or kV according to patient size and/or use of iterative reconstruction technique. CONTRAST:  75mL OMNIPAQUE  IOHEXOL  350 MG/ML SOLN COMPARISON:  Head CT 06/13/2013 FINDINGS: CT HEAD FINDINGS Brain: No mass,hemorrhage or extra-axial collection. Normal appearance of the parenchyma and CSF spaces. Vascular: No hyperdense vessel or unexpected vascular calcification. Skull: The visualized skull base, calvarium and extracranial soft tissues are normal. Sinuses/Orbits: No  fluid levels or advanced mucosal thickening of the visualized paranasal sinuses. No mastoid or middle ear effusion. Normal orbits. CTA NECK FINDINGS Skeleton: No acute abnormality or high grade bony spinal canal stenosis. Other neck: Normal pharynx, larynx and major salivary glands. No cervical lymphadenopathy. Unremarkable thyroid gland. Upper chest: No pneumothorax or pleural effusion. No nodules or masses. Aortic arch: There is no calcific atherosclerosis of the aortic arch. Conventional 3 vessel aortic branching pattern. RIGHT carotid system: The right ICA is somewhat diminutive relative to the left. No plaque or stenosis. No acute abnormality. LEFT carotid system: Normal without aneurysm, dissection or stenosis. Vertebral arteries: Left dominant configuration.  There is no dissection, occlusion or flow-limiting stenosis to the skull base (V1-V3 segments). CTA HEAD FINDINGS POSTERIOR CIRCULATION: Vertebral arteries are normal. No proximal occlusion of the anterior or inferior cerebellar arteries. Basilar artery is normal. Superior cerebellar arteries are normal. Posterior cerebral arteries are normal. ANTERIOR CIRCULATION: Severe stenosis of the right ICA at the skull base, worst in the proximal lacerum segment and the distal cavernous segment. The left ICA is normal. Anterior cerebral arteries are normal. Middle cerebral arteries are normal. Venous sinuses: As permitted by contrast timing, patent. Anatomic variants: None Review of the MIP images confirms the above findings. IMPRESSION: 1. No acute intracranial abnormality. 2. Severe stenosis of the right ICA at the skull base, worst in the proximal lacerum segment and the distal cavernous segment. Etiology for this narrowing is unclear and the age is indeterminate. Electronically Signed   By: Franky Stanford M.D.   On: 09/13/2023 03:48    Labs:  CBC: Recent Labs    09/13/23 0215 09/14/23 2132 09/28/23 0846  WBC 8.4 7.8 11.2*  HGB 11.4* 12.3 12.0  HCT 32.3* 35.6* 35.1*  PLT 276 332 345    COAGS: No results for input(s): INR, APTT in the last 8760 hours.  BMP: Recent Labs    09/13/23 0215 09/13/23 0417 09/14/23 2132 09/28/23 0846  NA 135  --  137 136  K 2.6* 2.8* 2.9* 3.4*  CL 102  --  100 104  CO2 25  --  26 24  GLUCOSE 105*  --  97 110*  BUN 10  --  7 15  CALCIUM  8.4*  --  8.6* 8.7*  CREATININE 0.55  --  0.75 0.72  GFRNONAA >60  --  >60 >60    LIVER FUNCTION TESTS: Recent Labs    09/13/23 0215 09/28/23 0846  BILITOT 0.6 0.4  AST 14* 15  ALT 15 14  ALKPHOS 80 82  PROT 6.9 7.3  ALBUMIN 3.4* 3.7    Assessment and Plan:  43 y.o. female. Outpatient. History of Crohn's, endometriosis. Presented to the ED at [facility] on 12.20.24 with right sided headache and  hypertension. X 3- 4 days, Found to have severe intracranial carotid arterial stenosis. MR Angio Head from 12.20.24 reads  Severe stenosis of the right ICA at the skull base, worst in the proximal lacerum segment and the distal cavernous segment. Etiology for this narrowing is unclear and the age is indeterminate. Patient was referred to [facility]. Patient presents for  diagnostic cerebral angiogram.   PLAN NIR Image Guided Diagnostic Cerebral Angiogram  Risks and benefits of cerebral angiogram were discussed with the patient including, but not limited to bleeding, infection, vascular injury or contrast induced renal failure.  This interventional procedure involves the use of X-rays and because of the nature of the planned procedure, it is possible that we will  have prolonged use of X-ray fluoroscopy.  Potential radiation risks to you include (but are not limited to) the following: - A slightly elevated risk for cancer  several years later in life. This risk is typically less than 0.5% percent. This risk is low in comparison to the normal incidence of human cancer, which is 33% for women and 50% for men according to the [employer]. - Radiation induced injury can include skin redness, resembling a rash, tissue breakdown / ulcers and hair loss (which can be temporary or permanent).   The likelihood of either of these occurring depends on the difficulty of the procedure and whether you are sensitive to radiation due to previous procedures, disease, or genetic conditions.   IF your procedure requires a prolonged use of radiation, you will be notified and given written instructions for further action.  It is your responsibility to monitor the irradiated area for the 2 weeks following the procedure and to notify your physician if you are concerned that you have suffered a radiation induced injury.    All of the patient's questions were answered, patient is agreeable to proceed.  Consent  signed and in chart.     Thank you for this interesting consult.  I greatly enjoyed meeting TATAYANA BESHEARS and look forward to participating in their care.  A copy of this report was sent to the requesting provider on this date.  Electronically Signed: Delon JAYSON Beagle, NP 09/30/2023, 8:46 AM   I spent a total of  30 Minutes   in face to face in clinical consultation, greater than 50% of which was counseling/coordinating care for diagnostic cerebral angiogram

## 2023-09-30 ENCOUNTER — Other Ambulatory Visit (HOSPITAL_COMMUNITY): Payer: Self-pay | Admitting: Interventional Radiology

## 2023-09-30 ENCOUNTER — Ambulatory Visit (HOSPITAL_COMMUNITY)
Admission: RE | Admit: 2023-09-30 | Discharge: 2023-09-30 | Disposition: A | Discharge status: Home / Self Care | Payer: BC Managed Care – PPO | Source: Ambulatory Visit | Attending: Interventional Radiology | Admitting: Interventional Radiology

## 2023-09-30 ENCOUNTER — Other Ambulatory Visit: Payer: Self-pay

## 2023-09-30 DIAGNOSIS — I6521 Occlusion and stenosis of right carotid artery: Secondary | ICD-10-CM | POA: Insufficient documentation

## 2023-09-30 DIAGNOSIS — K5090 Crohn's disease, unspecified, without complications: Secondary | ICD-10-CM | POA: Diagnosis not present

## 2023-09-30 DIAGNOSIS — Z7982 Long term (current) use of aspirin: Secondary | ICD-10-CM | POA: Insufficient documentation

## 2023-09-30 DIAGNOSIS — I10 Essential (primary) hypertension: Secondary | ICD-10-CM | POA: Diagnosis not present

## 2023-09-30 HISTORY — PX: IR RADIOLOGIST EVAL & MGMT: IMG5224

## 2023-09-30 HISTORY — PX: IR ANGIO VERTEBRAL SEL VERTEBRAL UNI R MOD SED: IMG5368

## 2023-09-30 HISTORY — PX: IR ANGIO VERTEBRAL SEL SUBCLAVIAN INNOMINATE UNI L MOD SED: IMG5364

## 2023-09-30 HISTORY — PX: IR ANGIO INTRA EXTRACRAN SEL COM CAROTID INNOMINATE BILAT MOD SED: IMG5360

## 2023-09-30 HISTORY — PX: IR US GUIDE VASC ACCESS RIGHT: IMG2390

## 2023-09-30 LAB — CBC WITH DIFFERENTIAL/PLATELET
Abs Immature Granulocytes: 0.04 10*3/uL (ref 0.00–0.07)
Basophils Absolute: 0 10*3/uL (ref 0.0–0.1)
Basophils Relative: 0 %
Eosinophils Absolute: 0.1 10*3/uL (ref 0.0–0.5)
Eosinophils Relative: 1 %
HCT: 34.9 % — ABNORMAL LOW (ref 36.0–46.0)
Hemoglobin: 11.6 g/dL — ABNORMAL LOW (ref 12.0–15.0)
Immature Granulocytes: 1 %
Lymphocytes Relative: 19 %
Lymphs Abs: 1.4 10*3/uL (ref 0.7–4.0)
MCH: 29.4 pg (ref 26.0–34.0)
MCHC: 33.2 g/dL (ref 30.0–36.0)
MCV: 88.4 fL (ref 80.0–100.0)
Monocytes Absolute: 0.4 10*3/uL (ref 0.1–1.0)
Monocytes Relative: 5 %
Neutro Abs: 5.4 10*3/uL (ref 1.7–7.7)
Neutrophils Relative %: 74 %
Platelets: 377 10*3/uL (ref 150–400)
RBC: 3.95 MIL/uL (ref 3.87–5.11)
RDW: 12.7 % (ref 11.5–15.5)
WBC: 7.3 10*3/uL (ref 4.0–10.5)
nRBC: 0 % (ref 0.0–0.2)

## 2023-09-30 LAB — BASIC METABOLIC PANEL
Anion gap: 9 (ref 5–15)
BUN: 19 mg/dL (ref 6–20)
CO2: 25 mmol/L (ref 22–32)
Calcium: 8.5 mg/dL — ABNORMAL LOW (ref 8.9–10.3)
Chloride: 104 mmol/L (ref 98–111)
Creatinine, Ser: 0.73 mg/dL (ref 0.44–1.00)
GFR, Estimated: >60 mL/min (ref >60)
Glucose, Bld: 102 mg/dL — ABNORMAL HIGH (ref 70–99)
Potassium: 3.6 mmol/L (ref 3.5–5.1)
Sodium: 138 mmol/L (ref 135–145)

## 2023-09-30 LAB — PROTIME-INR
INR: 1 (ref 0.8–1.2)
Prothrombin Time: 13.6 s (ref 11.4–15.2)

## 2023-09-30 LAB — PREGNANCY, URINE: Preg Test, Ur: NEGATIVE

## 2023-09-30 MED ORDER — SODIUM CHLORIDE 0.9 % IV SOLN: INTRAVENOUS | Status: AC

## 2023-09-30 MED ORDER — VERAPAMIL HCL 2.5 MG/ML IV SOLN
INTRAVENOUS | Status: AC
  Filled 2023-09-30: qty 2

## 2023-09-30 MED ORDER — FENTANYL CITRATE (PF) 100 MCG/2ML IJ SOLN
INTRAMUSCULAR | Status: AC | PRN
  Administered 2023-09-30: 25 ug via INTRAVENOUS
  Administered 2023-09-30: 50 ug via INTRAVENOUS
  Administered 2023-09-30: 25 ug via INTRAVENOUS

## 2023-09-30 MED ORDER — LIDOCAINE HCL 1 % IJ SOLN
20.0000 mL | Freq: Once | INTRAMUSCULAR | Status: AC
  Administered 2023-09-30: 10 mL

## 2023-09-30 MED ORDER — ONDANSETRON HCL 4 MG/2ML IJ SOLN
4.0000 mg | Freq: Once | INTRAMUSCULAR | Status: AC
  Administered 2023-09-30: 4 mg via INTRAVENOUS

## 2023-09-30 MED ORDER — NITROGLYCERIN 1 MG/10 ML FOR IR/CATH LAB: INTRA_ARTERIAL | Status: AC | PRN

## 2023-09-30 MED ORDER — MIDAZOLAM HCL 2 MG/2ML IJ SOLN
INTRAMUSCULAR | Status: AC | PRN
  Administered 2023-09-30 (×2): .5 mg via INTRAVENOUS
  Administered 2023-09-30: 1 mg via INTRAVENOUS

## 2023-09-30 MED ORDER — MIDAZOLAM HCL 2 MG/2ML IJ SOLN
INTRAMUSCULAR | Status: AC
Start: 2023-09-30 — End: ?
  Filled 2023-09-30: qty 4

## 2023-09-30 MED ORDER — HEPARIN SODIUM (PORCINE) 1000 UNIT/ML IJ SOLN
INTRAMUSCULAR | Status: AC
  Filled 2023-09-30: qty 10

## 2023-09-30 MED ORDER — IOHEXOL 300 MG/ML  SOLN
150.0000 mL | Freq: Once | INTRAMUSCULAR | Status: AC | PRN
  Administered 2023-09-30: 100 mL via INTRA_ARTERIAL

## 2023-09-30 MED ORDER — NITROGLYCERIN 1 MG/10 ML FOR IR/CATH LAB
INTRA_ARTERIAL | Status: AC
  Filled 2023-09-30: qty 10

## 2023-09-30 MED ORDER — LIDOCAINE HCL 1 % IJ SOLN: 20.0000 mL | Freq: Once | INTRAMUSCULAR | Status: DC

## 2023-09-30 MED ORDER — FENTANYL CITRATE (PF) 100 MCG/2ML IJ SOLN
INTRAMUSCULAR | Status: AC
  Filled 2023-09-30: qty 4

## 2023-09-30 MED ORDER — ONDANSETRON HCL 4 MG/2ML IJ SOLN
INTRAMUSCULAR | Status: AC
  Filled 2023-09-30: qty 2

## 2023-09-30 MED ORDER — IOHEXOL 300 MG/ML  SOLN
150.0000 mL | Freq: Once | INTRAMUSCULAR | Status: AC | PRN
  Administered 2023-09-30: 28 mL via INTRA_ARTERIAL

## 2023-09-30 MED ORDER — VERAPAMIL HCL 2.5 MG/ML IV SOLN: INTRA_ARTERIAL | Status: AC | PRN

## 2023-09-30 NOTE — Sedation Documentation (Signed)
 Doctor talking with patient and family member in IR3

## 2023-09-30 NOTE — Discharge Instructions (Signed)

## 2023-09-30 NOTE — Procedures (Signed)
 INR.  Status post four-vessel cerebral arteriogram.  Right radial approach.  Findings.  1.  Severe tandem stenosis of the right internal carotid artery at the distal petrous ICA and,  the supraclinoid segment.  2.  Severe stenosis at the right transverse sinus sigmoid sinus junction.  3.  Approximately 4.4 mm  intraluminal septum at the left sigmoid/transverse venous sinus junction.  No acute complications.  Patient tolerated the procedure well.  GORMAN Nash MD

## 2023-09-30 NOTE — Progress Notes (Signed)
 TR band removed, gauze dressing applied. Right radial level 0, clean, dry, and intact. Patient walked to the bathroom without difficulties.

## 2023-10-02 ENCOUNTER — Ambulatory Visit (HOSPITAL_COMMUNITY)
Admission: RE | Admit: 2023-10-02 | Discharge: 2023-10-02 | Disposition: A | Discharge status: Home / Self Care | Payer: BC Managed Care – PPO | Source: Ambulatory Visit | Attending: Interventional Radiology | Admitting: Interventional Radiology

## 2023-10-02 DIAGNOSIS — I6521 Occlusion and stenosis of right carotid artery: Secondary | ICD-10-CM | POA: Diagnosis present

## 2023-10-03 ENCOUNTER — Other Ambulatory Visit: Payer: Self-pay | Admitting: Radiology

## 2023-10-03 ENCOUNTER — Telehealth (HOSPITAL_COMMUNITY): Payer: Self-pay

## 2023-10-03 ENCOUNTER — Other Ambulatory Visit (HOSPITAL_COMMUNITY): Payer: Self-pay | Admitting: Interventional Radiology

## 2023-10-03 DIAGNOSIS — I771 Stricture of artery: Secondary | ICD-10-CM

## 2023-10-03 HISTORY — PX: IR RADIOLOGIST EVAL & MGMT: IMG5224

## 2023-10-03 MED ORDER — CLOPIDOGREL BISULFATE 75 MG PO TABS: 75.0000 mg | ORAL_TABLET | Freq: Every day | ORAL | 3 refills | Status: DC

## 2023-10-03 NOTE — Telephone Encounter (Signed)
 Called to give appt info for procedure, no answer, left vm. AB

## 2023-10-14 ENCOUNTER — Encounter (HOSPITAL_COMMUNITY): Payer: Self-pay | Admitting: Interventional Radiology

## 2023-10-14 ENCOUNTER — Encounter: Payer: Self-pay | Admitting: Nurse Practitioner

## 2023-10-14 ENCOUNTER — Ambulatory Visit (INDEPENDENT_AMBULATORY_CARE_PROVIDER_SITE_OTHER): Payer: BC Managed Care – PPO | Admitting: Nurse Practitioner

## 2023-10-14 ENCOUNTER — Other Ambulatory Visit (HOSPITAL_COMMUNITY): Payer: Self-pay

## 2023-10-14 ENCOUNTER — Other Ambulatory Visit (HOSPITAL_COMMUNITY)
Admission: RE | Admit: 2023-10-14 | Discharge: 2023-10-14 | Disposition: A | Discharge status: Home / Self Care | Payer: Self-pay | Source: Home / Self Care | Attending: Oncology | Admitting: Oncology

## 2023-10-14 VITALS — BP 128/78 | HR 89 | Temp 97.0°F | Ht 68.0 in | Wt 188.0 lb

## 2023-10-14 DIAGNOSIS — Z006 Encounter for examination for normal comparison and control in clinical research program: Secondary | ICD-10-CM | POA: Insufficient documentation

## 2023-10-14 DIAGNOSIS — J309 Allergic rhinitis, unspecified: Secondary | ICD-10-CM | POA: Diagnosis not present

## 2023-10-14 DIAGNOSIS — Z13220 Encounter for screening for lipoid disorders: Secondary | ICD-10-CM | POA: Insufficient documentation

## 2023-10-14 DIAGNOSIS — Z131 Encounter for screening for diabetes mellitus: Secondary | ICD-10-CM | POA: Insufficient documentation

## 2023-10-14 DIAGNOSIS — I10 Essential (primary) hypertension: Secondary | ICD-10-CM | POA: Diagnosis not present

## 2023-10-14 DIAGNOSIS — I771 Stricture of artery: Secondary | ICD-10-CM

## 2023-10-14 DIAGNOSIS — I6521 Occlusion and stenosis of right carotid artery: Secondary | ICD-10-CM | POA: Insufficient documentation

## 2023-10-14 LAB — PLATELET INHIBITION P2Y12: Platelet Function  P2Y12: 125 [PRU] — ABNORMAL LOW (ref 182–335)

## 2023-10-14 MED ORDER — AMLODIPINE BESYLATE 5 MG PO TABS: 5.0000 mg | ORAL_TABLET | Freq: Every day | ORAL | 0 refills | Status: DC

## 2023-10-14 MED ORDER — CETIRIZINE HCL 10 MG PO TABS: 10.0000 mg | ORAL_TABLET | Freq: Every day | ORAL | 1 refills | Status: DC

## 2023-10-14 NOTE — Assessment & Plan Note (Signed)
Patient encouraged to start taking Zyrtec 10 mg daily Continue saline nasal  spray as needed

## 2023-10-14 NOTE — Progress Notes (Signed)
New Patient Office Visit  Subjective:  Patient ID: Diane [name], female    DOB: 05-Dec-1980  Age: 43 y.o. MRN: [id number]  CC:  Chief Complaint  Patient presents with   Establish Care   Hypertension    Seeing IR     HPI Diane [name] is a 43 y.o. female   has a past medical history of ADD (attention deficit disorder), Anemia, Anxiety, Crohn's disease (HCC), D-dimer, elevated, Fibroid, Headache(784.0), Heart murmur, History of endometriosis, Hypertension, Leiomyoma (02/09/2015), Low blood potassium, Migraine without aura, with intractable migraine, so stated, without mention of status migrainosus (06/19/2013), Pneumonia, PONV (postoperative nausea and vomiting), Seasonal allergies, Vaginal Pap smear, abnormal, and Vasovagal syncope.  Patient presented establish care for her chronic medical conditions  Stenosis of right carotid artery.  Currently on Plavix 75 mg daily, aspirin 81 mg daily.  Has an upcoming carotid artery stenting on Wednesday this week.  She is not on a statin, has family history of heart disease.   Hypertension.  Currently on amlodipine 5 mg daily, stated that her blood pressure readings at home has been in the 130s to 140s over 90s.  Currently denies chest pain, shortness of breath, edema  Allergic rhinitis.  Patient complains of itchy throat sneezing, nasal drainage that started yesterday.  Has Zyrtec ordered that she has not been taking.  Stated that she had an headache yesterday, no fever  She takes alprazolam as needed for anxiety, she is sees a psychiatrist.   She was evaluated at the emergency department in December and prescribed gabapentin, prednisone for her left leg pain.  MRI of the lumbar spine showed mild narrowing of the left lateral recess at L5-S1 with moderate left neural foraminal narrowing, the ED doctor thought this could potentially explain her symptoms.  She did not take gabapentin but took some prednisone.  She currently denies leg pain.  She  plans to follow-up with neurosurgery if her pain returns.    Past Medical History:  Diagnosis Date   ADD (attention deficit disorder)    Anemia    Anxiety    Crohn's disease (HCC)    D-dimer, elevated    Fibroid    Headache(784.0)    otc meds - last migraine 2009   Heart murmur    as child, never had any problems   History of endometriosis    Hypertension    Leiomyoma 02/09/2015   Low blood potassium    history   Migraine without aura, with intractable migraine, so stated, without mention of status migrainosus 06/19/2013   Pneumonia    history back in 2006   PONV (postoperative nausea and vomiting)    Seasonal allergies    Vaginal Pap smear, abnormal    Vasovagal syncope     Past Surgical History:  Procedure Laterality Date   ADENOIDECTOMY     CESAREAN SECTION N/A 04/14/2018   Procedure: PRIMARY CESAREAN SECTION;  Surgeon: Tilda Burrow, MD;  Location: [facility];  Service: Obstetrics;  Laterality: N/A;   DILATATION & CURRETTAGE/HYSTEROSCOPY WITH RESECTOCOPE N/A 06/09/2013   Procedure: DILATATION & CURETTAGE/HYSTEROSCOPY WITH HYSTEROSCOPIC RESECTION OF SUBMUCOSAL FIBROID AND ENDOMETRIAL POLYP;  Surgeon: Serita Kyle, MD;  Location: [facility];  Service: Gynecology;  Laterality: N/A;   IR ANGIO INTRA EXTRACRAN SEL COM CAROTID INNOMINATE BILAT MOD SED  09/30/2023   IR ANGIO VERTEBRAL SEL SUBCLAVIAN INNOMINATE UNI L MOD SED  09/30/2023   IR ANGIO VERTEBRAL SEL VERTEBRAL UNI R MOD SED  09/30/2023  IR RADIOLOGIST EVAL & MGMT  09/30/2023   IR RADIOLOGIST EVAL & MGMT  10/03/2023   IR US GUIDE VASC ACCESS RIGHT  09/30/2023   LAPAROSCOPY N/A 06/09/2013   Procedure: LAPAROSCOPY DIAGNOSTIC;  Surgeon: Serita Kyle, MD;  Location: [facility];  Service: Gynecology;  Laterality: N/A;   ROBOT ASSISTED MYOMECTOMY N/A 02/09/2015   Procedure: MYOMECTOMY,EXCISION OF ENDOMETRIOSIS,PRE-SACRAL NEURECTOMY;  Surgeon: Fermin Schwab, MD;  Location: [facility];  Service: Gynecology;  Laterality:  N/A;   ROBOTIC ASSISTED LAPAROSCOPIC LYSIS OF ADHESION N/A 06/09/2013   Procedure: ROBOTIC ASSISTED LAPAROSCOPIC LYSIS OF ADHESION; Resection of Endometriosis and excision of fibroid;  Surgeon: Serita Kyle, MD;  Location: [facility];  Service: Gynecology;  Laterality: N/A;   TONSILLECTOMY     TYMPANOSTOMY TUBE PLACEMENT      Family History  Problem Relation Age of Onset   Heart disease Mother 68       CHF   Crohn's disease Mother    Asthma Mother    Asthma Brother    Heart disease Maternal Aunt    Diabetes Maternal Aunt    Stroke Maternal Aunt    Heart disease Maternal Grandfather    Stroke Maternal Grandfather     Social History   Socioeconomic History   Marital status: Divorced    Spouse name: Not on file   Number of children: 2   Years of education: some coll.   Highest education level: Not on file  Occupational History   Not on file  Tobacco Use   Smoking status: Never   Smokeless tobacco: Never  Vaping Use   Vaping status: Never Used  Substance and Sexual Activity   Alcohol use: No   Drug use: No   Sexual activity: Yes    Birth control/protection: None  Other Topics Concern   Not on file  Social History Narrative   Patient does not drink caffeine.   Patient is right handed.    Social Drivers of Corporate investment banker Strain: Not on file  Food Insecurity: Not on file  Transportation Needs: Not on file  Physical Activity: Not on file  Stress: Not on file  Social Connections: Not on file  Intimate Partner Violence: Not on file    ROS Review of Systems  Constitutional:  Negative for activity change, appetite change, chills, fatigue and fever.  HENT:  Positive for congestion, rhinorrhea and sneezing. Negative for dental problem, ear discharge, ear pain, hearing loss, sinus pressure, sinus pain and sore throat.   Eyes:  Negative for pain, discharge, redness and itching.  Respiratory:  Negative for cough, chest tightness, shortness of breath and  wheezing.   Cardiovascular:  Negative for chest pain, palpitations and leg swelling.  Gastrointestinal:  Negative for abdominal distention, abdominal pain, anal bleeding, blood in stool, constipation, diarrhea, nausea, rectal pain and vomiting.  Endocrine: Negative for cold intolerance, heat intolerance, polydipsia, polyphagia and polyuria.  Genitourinary:  Negative for difficulty urinating, dysuria, flank pain, frequency, hematuria, menstrual problem, pelvic pain and vaginal bleeding.  Musculoskeletal:  Negative for arthralgias, back pain, gait problem, joint swelling and myalgias.  Skin:  Negative for color change, pallor, rash and wound.  Allergic/Immunologic: Negative for environmental allergies, food allergies and immunocompromised state.  Neurological:  Negative for dizziness, tremors, facial asymmetry, weakness and headaches.  Hematological:  Negative for adenopathy. Does not bruise/bleed easily.  Psychiatric/Behavioral:  Negative for agitation, behavioral problems, confusion, decreased concentration, hallucinations, self-injury and suicidal ideas.     Objective:  Today's Vitals: BP 128/78   Pulse 89   Temp (!) 97 F (36.1 C)   Ht 5\' 8"  (1.727 m)   Wt 188 lb (85.3 kg)   SpO2 100%   BMI 28.59 kg/m   Physical Exam Vitals and nursing note reviewed.  Constitutional:      General: She is not in acute distress.    Appearance: Normal appearance. She is not ill-appearing, toxic-appearing or diaphoretic.  HENT:     Right Ear: Tympanic membrane, ear canal and external ear normal. There is no impacted cerumen.     Left Ear: External ear normal. There is no impacted cerumen.     Nose: Congestion present. No rhinorrhea.     Mouth/Throat:     Mouth: Mucous membranes are moist.     Pharynx: Oropharynx is clear. No oropharyngeal exudate or posterior oropharyngeal erythema.     Tonsils: 0 on the right. 0 on the left.  Eyes:     General: No scleral icterus.       Right eye: No  discharge.        Left eye: No discharge.     Extraocular Movements: Extraocular movements intact.     Conjunctiva/sclera: Conjunctivae normal.  Cardiovascular:     Rate and Rhythm: Normal rate and regular rhythm.     Pulses: Normal pulses.     Heart sounds: Normal heart sounds. No murmur heard.    No friction rub. No gallop.  Pulmonary:     Effort: Pulmonary effort is normal. No respiratory distress.     Breath sounds: Normal breath sounds. No stridor. No wheezing, rhonchi or rales.  Chest:     Chest wall: No tenderness.  Abdominal:     General: There is no distension.     Palpations: Abdomen is soft.     Tenderness: There is no abdominal tenderness. There is no right CVA tenderness, left CVA tenderness or guarding.  Musculoskeletal:        General: No swelling, tenderness, deformity or signs of injury.     Right lower leg: No edema.     Left lower leg: No edema.  Skin:    General: Skin is warm and dry.     Capillary Refill: Capillary refill takes less than 2 seconds.     Coloration: Skin is not jaundiced or pale.     Findings: No bruising, erythema or lesion.  Neurological:     Mental Status: She is alert and oriented to person, place, and time.     Motor: No weakness.     Coordination: Coordination normal.     Gait: Gait normal.  Psychiatric:        Mood and Affect: Mood normal.        Behavior: Behavior normal.        Thought Content: Thought content normal.        Judgment: Judgment normal.     Assessment & Plan:   Problem List Items Addressed This Visit       Cardiovascular and Mediastinum   High blood pressure   Well-controlled on amlodipine 5 mg daily Continue current medication DASH diet and commitment to daily physical activity for a minimum of 30 minutes discussed and encouraged, as a part of hypertension management. The importance of attaining a healthy weight is also discussed.     10/14/2023    1:06 PM 09/30/2023    2:23 PM 09/30/2023    1:23 PM  09/30/2023   12:53 PM 09/30/2023  12:38 PM 09/30/2023   12:18 PM 09/30/2023   12:02 PM  BP/Weight  Systolic BP 128 133 109 131 131 124 137  Diastolic BP 78 86 70 87 85 96 454  Wt. (Lbs) 188        BMI 28.59 kg/m2                 Relevant Medications   amLODipine (NORVASC) 5 MG tablet   Stenosis of right carotid artery   Continue Plavix 75 mg daily, aspirin 81 mg daily 1. Screening for lipid disorders (Primary)  - Direct LDL - Lipoprotein A (LPA) - Lipid panel       Relevant Medications   amLODipine (NORVASC) 5 MG tablet     Respiratory   Allergic rhinitis   Patient encouraged to start taking Zyrtec 10 mg daily Continue saline nasal  spray as needed      Relevant Medications   cetirizine (ZYRTEC ALLERGY) 10 MG tablet     Other   Screening for lipid disorders - Primary   Relevant Orders   Direct LDL   Lipoprotein A (LPA)   Lipid panel   Other Visit Diagnoses       Screening for diabetes mellitus       Relevant Orders   Hemoglobin A1c       Outpatient Encounter Medications as of 10/14/2023  Medication Sig   acetaminophen (TYLENOL) 325 MG tablet Take 2 tablets (650 mg total) by mouth every 4 (four) hours as needed (for pain scale < 4).   ALPRAZolam (XANAX) 0.5 MG tablet Take 0.5 mg by mouth daily as needed.   aspirin 81 MG chewable tablet Chew 1 tablet (81 mg total) by mouth daily.   clopidogrel (PLAVIX) 75 MG tablet Take 1 tablet (75 mg total) by mouth daily.   predniSONE (DELTASONE) 10 MG tablet Take 5 tablets (50 mg total) by mouth daily for 3 days, THEN 4 tablets (40 mg total) daily for 3 days, THEN 3 tablets (30 mg total) daily for 3 days, THEN 2 tablets (20 mg total) daily for 3 days, THEN 1 tablet (10 mg total) daily for 3 days, THEN 0.5 tablets (5 mg total) daily for 3 days.   [DISCONTINUED] amLODipine (NORVASC) 5 MG tablet Take 1 tablet (5 mg total) by mouth daily.   amLODipine (NORVASC) 5 MG tablet Take 1 tablet (5 mg total) by mouth daily.    amphetamine-dextroamphetamine (ADDERALL) 30 MG tablet Take 30 mg by mouth daily. (Patient not taking: Reported on 10/14/2023)   cetirizine (ZYRTEC ALLERGY) 10 MG tablet Take 1 tablet (10 mg total) by mouth daily.   gabapentin (NEURONTIN) 100 MG capsule Take 1 capsule (100 mg total) by mouth 3 (three) times daily. (Patient not taking: Reported on 10/14/2023)   lidocaine (LIDODERM) 5 % Place 1 patch onto the skin daily. Remove & Discard patch within 12 hours or as directed by MD (Patient not taking: Reported on 10/14/2023)   ondansetron (ZOFRAN-ODT) 4 MG disintegrating tablet Take 1 tablet (4 mg total) by mouth every 8 (eight) hours as needed for nausea or vomiting. (Patient not taking: Reported on 10/14/2023)   [DISCONTINUED] amLODipine (NORVASC) 5 MG tablet Take 1 tablet (5 mg total) by mouth daily.   [DISCONTINUED] amoxicillin-clavulanate (AUGMENTIN) 875-125 MG tablet Take 1 tablet by mouth every 12 (twelve) hours. (Patient not taking: Reported on 10/14/2023)   [DISCONTINUED] cetirizine (ZYRTEC ALLERGY) 10 MG tablet Take 1 tablet (10 mg total) by mouth 2 (two) times daily. (Patient not  taking: Reported on 10/14/2023)   [DISCONTINUED] HYDROcodone-acetaminophen (NORCO/VICODIN) 5-325 MG tablet Take 1 tablet by mouth every 6 (six) hours as needed. (Patient not taking: Reported on 10/14/2023)   [DISCONTINUED] potassium chloride SA (KLOR-CON M) 20 MEQ tablet Take 1 tablet (20 mEq total) by mouth 2 (two) times daily. (Patient not taking: Reported on 10/14/2023)   No facility-administered encounter medications on file as of 10/14/2023.    Follow-up: Return in about 6 weeks (around 11/25/2023).   Donell Beers, FNP

## 2023-10-14 NOTE — Patient Instructions (Signed)
Around 3 times per week, check your blood pressure 2 times per day. once in the morning and once in the evening. The readings should be at least one minute apart. Write down these values and bring them to your next nurse visit/appointment.  When you check your BP, make sure you have been doing something calm/relaxing 5 minutes prior to checking. Both feet should be flat on the floor and you should be sitting. Use your left arm and make sure it is in a relaxed position (on a table), and that the cuff is at the approximate level/height of your heart.   Blood pressure goal is less than 130/80.    It is important that you exercise regularly at least 30 minutes 5 times a week as tolerated  Think about what you will eat, plan ahead. Choose " clean, green, fresh or frozen" over canned, processed or packaged foods which are more sugary, salty and fatty. 70 to 75% of food eaten should be vegetables and fruit. Three meals at set times with snacks allowed between meals, but they must be fruit or vegetables. Aim to eat over a 12 hour period , example 7 am to 7 pm, and STOP after  your last meal of the day. Drink water,generally about 64 ounces per day, no other drink is as healthy. Fruit juice is best enjoyed in a healthy way, by EATING the fruit.  Thanks for choosing [facility] we consider it a privelige to serve you.

## 2023-10-14 NOTE — Assessment & Plan Note (Signed)
Continue Plavix 75 mg daily, aspirin 81 mg daily 1. Screening for lipid disorders (Primary)  - Direct LDL - Lipoprotein A (LPA) - Lipid panel

## 2023-10-14 NOTE — Assessment & Plan Note (Signed)
Well-controlled on amlodipine 5 mg daily Continue current medication DASH diet and commitment to daily physical activity for a minimum of 30 minutes discussed and encouraged, as a part of hypertension management. The importance of attaining a healthy weight is also discussed.     10/14/2023    1:06 PM 09/30/2023    2:23 PM 09/30/2023    1:23 PM 09/30/2023   12:53 PM 09/30/2023   12:38 PM 09/30/2023   12:18 PM 09/30/2023   12:02 PM  BP/Weight  Systolic BP 128 133 109 131 131 124 137  Diastolic BP 78 86 70 87 85 96 478  Wt. (Lbs) 188        BMI 28.59 kg/m2

## 2023-10-15 ENCOUNTER — Other Ambulatory Visit: Payer: Self-pay

## 2023-10-15 ENCOUNTER — Other Ambulatory Visit: Payer: Self-pay | Admitting: Radiology

## 2023-10-15 ENCOUNTER — Other Ambulatory Visit: Payer: Self-pay | Admitting: Nurse Practitioner

## 2023-10-15 ENCOUNTER — Encounter (HOSPITAL_COMMUNITY): Payer: Self-pay | Admitting: Interventional Radiology

## 2023-10-15 DIAGNOSIS — E785 Hyperlipidemia, unspecified: Secondary | ICD-10-CM

## 2023-10-15 DIAGNOSIS — I6521 Occlusion and stenosis of right carotid artery: Secondary | ICD-10-CM

## 2023-10-15 MED ORDER — ATORVASTATIN CALCIUM 20 MG PO TABS: 20.0000 mg | ORAL_TABLET | Freq: Every day | ORAL | 1 refills | Status: DC

## 2023-10-15 NOTE — Progress Notes (Signed)
SDW CALL  Patient was given pre-op instructions over the phone. The opportunity was given for the patient to ask questions. No further questions asked. Patient verbalized understanding of instructions given.   PCP - Dr. Edwin Dada Cardiologist - denies  PPM/ICD - denies Device Orders - n/a Rep Notified - n/a  Chest x-ray - 09/14/23 EKG - 09/28/23 Stress Test - denies ECHO - denies Cardiac Cath - denies  Sleep Study - denies  No DM  Last dose of GLP1 agonist-  n/a GLP1 instructions: n/a  Blood Thinner Instructions: continue Aspirin and Plavix including DOS - patient is aware Aspirin Instructions: take DOS  ERAS Protcol - NPO   COVID TEST- n/a   Anesthesia review: sent to review  Patient denies shortness of breath, fever, cough and chest pain over the phone call   All instructions explained to the patient, with a verbal understanding of the material. Patient agrees to go over the instructions while at home for a better understanding.    Patient stated that she visited the ED in December due to elevated blood pressure.  On arrival to ED, it was noted that she had a fever and CXR was obtained.  Patient stated that she was told she had pneumonia, started on an antibiotic which has now been completed.  Patient stated that she never had any respiratory issues at that time.  Patient denies any cough.

## 2023-10-16 ENCOUNTER — Ambulatory Visit (HOSPITAL_COMMUNITY)
Admission: RE | Admit: 2023-10-16 | Discharge: 2023-10-16 | Disposition: A | Discharge status: Home / Self Care | Payer: BC Managed Care – PPO | Source: Ambulatory Visit | Attending: Interventional Radiology | Admitting: Interventional Radiology

## 2023-10-16 ENCOUNTER — Ambulatory Visit (HOSPITAL_COMMUNITY): Payer: Self-pay | Admitting: Physician Assistant

## 2023-10-16 ENCOUNTER — Encounter (HOSPITAL_COMMUNITY)
Admission: AD | Disposition: A | Discharge status: Home / Self Care | Payer: Self-pay | Source: Home / Self Care | Attending: Interventional Radiology

## 2023-10-16 ENCOUNTER — Inpatient Hospital Stay (HOSPITAL_COMMUNITY)
Admission: AD | Admit: 2023-10-16 | Discharge: 2023-10-18 | DRG: 026 | Disposition: A | Discharge status: Home Health | Payer: BC Managed Care – PPO | Attending: Interventional Radiology | Admitting: Interventional Radiology

## 2023-10-16 ENCOUNTER — Encounter (HOSPITAL_COMMUNITY): Payer: Self-pay

## 2023-10-16 DIAGNOSIS — F419 Anxiety disorder, unspecified: Secondary | ICD-10-CM | POA: Diagnosis present

## 2023-10-16 DIAGNOSIS — I6521 Occlusion and stenosis of right carotid artery: Secondary | ICD-10-CM

## 2023-10-16 DIAGNOSIS — Z833 Family history of diabetes mellitus: Secondary | ICD-10-CM

## 2023-10-16 DIAGNOSIS — Z8249 Family history of ischemic heart disease and other diseases of the circulatory system: Secondary | ICD-10-CM

## 2023-10-16 DIAGNOSIS — R519 Headache, unspecified: Secondary | ICD-10-CM | POA: Diagnosis not present

## 2023-10-16 DIAGNOSIS — K219 Gastro-esophageal reflux disease without esophagitis: Secondary | ICD-10-CM | POA: Diagnosis present

## 2023-10-16 DIAGNOSIS — Z7982 Long term (current) use of aspirin: Secondary | ICD-10-CM

## 2023-10-16 DIAGNOSIS — I10 Essential (primary) hypertension: Secondary | ICD-10-CM | POA: Diagnosis present

## 2023-10-16 DIAGNOSIS — Z006 Encounter for examination for normal comparison and control in clinical research program: Secondary | ICD-10-CM

## 2023-10-16 DIAGNOSIS — K5090 Crohn's disease, unspecified, without complications: Secondary | ICD-10-CM | POA: Diagnosis present

## 2023-10-16 DIAGNOSIS — Z8701 Personal history of pneumonia (recurrent): Secondary | ICD-10-CM | POA: Diagnosis not present

## 2023-10-16 DIAGNOSIS — Z823 Family history of stroke: Secondary | ICD-10-CM

## 2023-10-16 DIAGNOSIS — Z79899 Other long term (current) drug therapy: Secondary | ICD-10-CM

## 2023-10-16 DIAGNOSIS — Z7902 Long term (current) use of antithrombotics/antiplatelets: Secondary | ICD-10-CM | POA: Diagnosis not present

## 2023-10-16 DIAGNOSIS — Z825 Family history of asthma and other chronic lower respiratory diseases: Secondary | ICD-10-CM | POA: Diagnosis not present

## 2023-10-16 DIAGNOSIS — I771 Stricture of artery: Secondary | ICD-10-CM

## 2023-10-16 HISTORY — DX: Essential (primary) hypertension: I10

## 2023-10-16 HISTORY — PX: IR INTRA CRAN STENT: IMG2345

## 2023-10-16 HISTORY — PX: IR ANGIO INTRA EXTRACRAN SEL INTERNAL CAROTID UNI R MOD SED: IMG5362

## 2023-10-16 HISTORY — PX: IR CT HEAD LTD: IMG2386

## 2023-10-16 HISTORY — PX: RADIOLOGY WITH ANESTHESIA: SHX6223

## 2023-10-16 LAB — CBC WITH DIFFERENTIAL/PLATELET
Abs Immature Granulocytes: 0.02 10*3/uL (ref 0.00–0.07)
Basophils Absolute: 0 10*3/uL (ref 0.0–0.1)
Basophils Relative: 1 %
Eosinophils Absolute: 0.2 10*3/uL (ref 0.0–0.5)
Eosinophils Relative: 3 %
HCT: 37.4 % (ref 36.0–46.0)
Hemoglobin: 12.2 g/dL (ref 12.0–15.0)
Immature Granulocytes: 0 %
Lymphocytes Relative: 19 %
Lymphs Abs: 1.6 10*3/uL (ref 0.7–4.0)
MCH: 29.4 pg (ref 26.0–34.0)
MCHC: 32.6 g/dL (ref 30.0–36.0)
MCV: 90.1 fL (ref 80.0–100.0)
Monocytes Absolute: 0.6 10*3/uL (ref 0.1–1.0)
Monocytes Relative: 7 %
Neutro Abs: 6 10*3/uL (ref 1.7–7.7)
Neutrophils Relative %: 70 %
Platelets: 249 10*3/uL (ref 150–400)
RBC: 4.15 MIL/uL (ref 3.87–5.11)
RDW: 13.4 % (ref 11.5–15.5)
WBC: 8.5 10*3/uL (ref 4.0–10.5)
nRBC: 0 % (ref 0.0–0.2)

## 2023-10-16 LAB — POCT PREGNANCY, URINE: Preg Test, Ur: NEGATIVE

## 2023-10-16 LAB — LIPID PANEL
Chol/HDL Ratio: 3.6 {ratio} (ref 0.0–4.4)
Cholesterol, Total: 207 mg/dL — ABNORMAL HIGH (ref 100–199)
HDL: 57 mg/dL (ref >39)
LDL Chol Calc (NIH): 139 mg/dL — ABNORMAL HIGH (ref 0–99)
Triglycerides: 62 mg/dL (ref 0–149)
VLDL Cholesterol Cal: 11 mg/dL (ref 5–40)

## 2023-10-16 LAB — POCT ACTIVATED CLOTTING TIME
Activated Clotting Time: 176 s
Activated Clotting Time: 170 s
Activated Clotting Time: 170 s

## 2023-10-16 LAB — BASIC METABOLIC PANEL
Anion gap: 12 (ref 5–15)
BUN: 18 mg/dL (ref 6–20)
CO2: 20 mmol/L — ABNORMAL LOW (ref 22–32)
Calcium: 8.4 mg/dL — ABNORMAL LOW (ref 8.9–10.3)
Chloride: 107 mmol/L (ref 98–111)
Creatinine, Ser: 0.74 mg/dL (ref 0.44–1.00)
GFR, Estimated: >60 mL/min (ref >60)
Glucose, Bld: 88 mg/dL (ref 70–99)
Potassium: 3.2 mmol/L — ABNORMAL LOW (ref 3.5–5.1)
Sodium: 139 mmol/L (ref 135–145)

## 2023-10-16 LAB — MRSA NEXT GEN BY PCR, NASAL: MRSA by PCR Next Gen: NOT DETECTED

## 2023-10-16 LAB — HEMOGLOBIN A1C
Est. average glucose Bld gHb Est-mCnc: 100 mg/dL
Hgb A1c MFr Bld: 5.1 % (ref 4.8–5.6)

## 2023-10-16 LAB — PROTIME-INR
INR: 1 (ref 0.8–1.2)
INR: 1.2 (ref 0.8–1.2)
Prothrombin Time: 13.2 s (ref 11.4–15.2)
Prothrombin Time: 15 s (ref 11.4–15.2)

## 2023-10-16 LAB — LDL CHOLESTEROL, DIRECT: LDL Direct: 135 mg/dL — ABNORMAL HIGH (ref 0–99)

## 2023-10-16 LAB — HEPARIN LEVEL (UNFRACTIONATED): Heparin Unfractionated: <0.1 [IU]/mL — ABNORMAL LOW (ref 0.30–0.70)

## 2023-10-16 LAB — LIPOPROTEIN A (LPA): Lipoprotein (a): 83.6 nmol/L — ABNORMAL HIGH (ref <75.0)

## 2023-10-16 SURGERY — RADIOLOGY WITH ANESTHESIA
Anesthesia: General

## 2023-10-16 MED ORDER — ACETAMINOPHEN 160 MG/5ML PO SOLN: 325.0000 mg | ORAL | Status: DC | PRN

## 2023-10-16 MED ORDER — HEPARIN (PORCINE) 25000 UT/250ML-% IV SOLN
500.0000 [IU]/h | INTRAVENOUS | Status: DC
  Administered 2023-10-16: 500 [IU]/h via INTRAVENOUS

## 2023-10-16 MED ORDER — PROPOFOL 500 MG/50ML IV EMUL
INTRAVENOUS | Status: DC | PRN
  Administered 2023-10-16: 20 ug/kg/min via INTRAVENOUS

## 2023-10-16 MED ORDER — ASPIRIN 325 MG PO TABS
325.0000 mg | ORAL_TABLET | Freq: Every day | ORAL | Status: DC
  Administered 2023-10-17 – 2023-10-18 (×2): 325 mg via ORAL
  Filled 2023-10-16 (×2): qty 1

## 2023-10-16 MED ORDER — ONDANSETRON HCL 4 MG/2ML IJ SOLN
INTRAMUSCULAR | Status: DC | PRN
  Administered 2023-10-16: 4 mg via INTRAVENOUS

## 2023-10-16 MED ORDER — TRAMADOL HCL 50 MG PO TABS
100.0000 mg | ORAL_TABLET | Freq: Once | ORAL | Status: AC | PRN
  Administered 2023-10-16: 100 mg via ORAL
  Filled 2023-10-16 (×2): qty 2

## 2023-10-16 MED ORDER — CLOPIDOGREL BISULFATE 75 MG PO TABS
75.0000 mg | ORAL_TABLET | ORAL | Status: AC
  Administered 2023-10-16: 75 mg via ORAL
  Filled 2023-10-16: qty 1

## 2023-10-16 MED ORDER — DEXAMETHASONE SODIUM PHOSPHATE 10 MG/ML IJ SOLN
INTRAMUSCULAR | Status: DC | PRN
  Administered 2023-10-16: 10 mg via INTRAVENOUS

## 2023-10-16 MED ORDER — SODIUM CHLORIDE (PF) 0.9 % IJ SOLN
INTRAVENOUS | Status: AC | PRN
  Administered 2023-10-16 (×3): 25 ug via INTRA_ARTERIAL

## 2023-10-16 MED ORDER — ACETAMINOPHEN 325 MG PO TABS
325.0000 mg | ORAL_TABLET | ORAL | Status: DC | PRN
Start: 2023-10-16 — End: 2023-10-16

## 2023-10-16 MED ORDER — NITROGLYCERIN 1 MG/10 ML FOR IR/CATH LAB
INTRA_ARTERIAL | Status: AC
  Filled 2023-10-16: qty 10

## 2023-10-16 MED ORDER — DIPHENHYDRAMINE HCL 50 MG/ML IJ SOLN
25.0000 mg | Freq: Once | INTRAMUSCULAR | Status: AC
  Administered 2023-10-16: 25 mg via INTRAVENOUS
  Filled 2023-10-16: qty 1

## 2023-10-16 MED ORDER — ROCURONIUM BROMIDE 10 MG/ML (PF) SYRINGE
PREFILLED_SYRINGE | INTRAVENOUS | Status: DC | PRN
  Administered 2023-10-16 (×2): 20 mg via INTRAVENOUS
  Administered 2023-10-16: 60 mg via INTRAVENOUS
  Administered 2023-10-16: 20 mg via INTRAVENOUS

## 2023-10-16 MED ORDER — ACETAMINOPHEN 325 MG PO TABS
650.0000 mg | ORAL_TABLET | ORAL | Status: DC | PRN
  Administered 2023-10-16 – 2023-10-18 (×4): 650 mg via ORAL
  Filled 2023-10-16 (×4): qty 2

## 2023-10-16 MED ORDER — CHLORHEXIDINE GLUCONATE 0.12 % MT SOLN
15.0000 mL | Freq: Once | OROMUCOSAL | Status: AC
  Filled 2023-10-16: qty 15

## 2023-10-16 MED ORDER — ACETAMINOPHEN 650 MG RE SUPP: 650.0000 mg | RECTAL | Status: DC | PRN

## 2023-10-16 MED ORDER — IOHEXOL 300 MG/ML  SOLN
150.0000 mL | Freq: Once | INTRAMUSCULAR | Status: AC | PRN
  Administered 2023-10-16: 14 mL via INTRA_ARTERIAL

## 2023-10-16 MED ORDER — PHENYLEPHRINE HCL-NACL 20-0.9 MG/250ML-% IV SOLN
INTRAVENOUS | Status: DC | PRN
  Administered 2023-10-16: 15 ug/min via INTRAVENOUS

## 2023-10-16 MED ORDER — PHENYLEPHRINE 80 MCG/ML (10ML) SYRINGE FOR IV PUSH (FOR BLOOD PRESSURE SUPPORT)
PREFILLED_SYRINGE | INTRAVENOUS | Status: DC | PRN
  Administered 2023-10-16 (×2): 80 ug via INTRAVENOUS

## 2023-10-16 MED ORDER — SUGAMMADEX SODIUM 200 MG/2ML IV SOLN
INTRAVENOUS | Status: DC | PRN
  Administered 2023-10-16: 200 mg via INTRAVENOUS

## 2023-10-16 MED ORDER — KETOROLAC TROMETHAMINE 15 MG/ML IJ SOLN
15.0000 mg | Freq: Three times a day (TID) | INTRAMUSCULAR | Status: DC
  Administered 2023-10-16: 15 mg via INTRAVENOUS
  Filled 2023-10-16: qty 1

## 2023-10-16 MED ORDER — ACETAMINOPHEN 10 MG/ML IV SOLN
1000.0000 mg | Freq: Once | INTRAVENOUS | Status: DC | PRN
Start: 2023-10-16 — End: 2023-10-16

## 2023-10-16 MED ORDER — CLEVIDIPINE BUTYRATE 0.5 MG/ML IV EMUL
0.0000 mg/h | INTRAVENOUS | Status: AC
  Administered 2023-10-16: 6 mg/h via INTRAVENOUS
  Filled 2023-10-16: qty 50

## 2023-10-16 MED ORDER — OXYCODONE HCL 5 MG/5ML PO SOLN: 5.0000 mg | Freq: Once | ORAL | Status: DC | PRN

## 2023-10-16 MED ORDER — OXYCODONE HCL 5 MG PO TABS: 5.0000 mg | ORAL_TABLET | Freq: Once | ORAL | Status: DC | PRN

## 2023-10-16 MED ORDER — SCOPOLAMINE 1 MG/3DAYS TD PT72
1.0000 | MEDICATED_PATCH | TRANSDERMAL | Status: DC
  Administered 2023-10-16: 1.5 mg via TRANSDERMAL
  Filled 2023-10-16: qty 1

## 2023-10-16 MED ORDER — ALPRAZOLAM 0.25 MG PO TABS
0.5000 mg | ORAL_TABLET | Freq: Once | ORAL | Status: AC
Start: 2023-10-16 — End: 2023-10-16
  Administered 2023-10-16: 0.5 mg via ORAL
  Filled 2023-10-16: qty 2

## 2023-10-16 MED ORDER — ORAL CARE MOUTH RINSE
15.0000 mL | Freq: Once | OROMUCOSAL | Status: AC
Start: 2023-10-16 — End: 2023-10-16
  Administered 2023-10-16: 15 mL via OROMUCOSAL

## 2023-10-16 MED ORDER — ASPIRIN 81 MG PO CHEW: 324.0000 mg | CHEWABLE_TABLET | Freq: Every day | ORAL | Status: DC

## 2023-10-16 MED ORDER — HEPARIN (PORCINE) 25000 UT/250ML-% IV SOLN
650.0000 [IU]/h | INTRAVENOUS | Status: DC
  Administered 2023-10-16: 650 [IU]/h via INTRAVENOUS

## 2023-10-16 MED ORDER — IOHEXOL 300 MG/ML  SOLN
150.0000 mL | Freq: Once | INTRAMUSCULAR | Status: AC | PRN
  Administered 2023-10-16: 100 mL via INTRA_ARTERIAL

## 2023-10-16 MED ORDER — LIDOCAINE 2% (20 MG/ML) 5 ML SYRINGE
INTRAMUSCULAR | Status: DC | PRN
  Administered 2023-10-16: 40 mg via INTRAVENOUS

## 2023-10-16 MED ORDER — FENTANYL CITRATE (PF) 100 MCG/2ML IJ SOLN
INTRAMUSCULAR | Status: AC
  Filled 2023-10-16: qty 2

## 2023-10-16 MED ORDER — CEFAZOLIN SODIUM-DEXTROSE 2-4 GM/100ML-% IV SOLN
2.0000 g | INTRAVENOUS | Status: DC
  Filled 2023-10-16: qty 100

## 2023-10-16 MED ORDER — FENTANYL CITRATE (PF) 100 MCG/2ML IJ SOLN
INTRAMUSCULAR | Status: DC | PRN
  Administered 2023-10-16: 100 ug via INTRAVENOUS

## 2023-10-16 MED ORDER — EPTIFIBATIDE 20 MG/10ML IV SOLN
INTRAVENOUS | Status: AC
  Filled 2023-10-16: qty 10

## 2023-10-16 MED ORDER — ACETAMINOPHEN 160 MG/5ML PO SOLN: 650.0000 mg | ORAL | Status: DC | PRN

## 2023-10-16 MED ORDER — ASPIRIN 81 MG PO TBEC: 81.0000 mg | DELAYED_RELEASE_TABLET | ORAL | Status: DC

## 2023-10-16 MED ORDER — KETOROLAC TROMETHAMINE 15 MG/ML IJ SOLN
15.0000 mg | Freq: Three times a day (TID) | INTRAMUSCULAR | Status: DC | PRN
  Administered 2023-10-17 – 2023-10-18 (×2): 15 mg via INTRAVENOUS
  Filled 2023-10-16 (×2): qty 1

## 2023-10-16 MED ORDER — POTASSIUM CHLORIDE IN NACL 20-0.9 MEQ/L-% IV SOLN
INTRAVENOUS | Status: AC
  Filled 2023-10-16: qty 1000

## 2023-10-16 MED ORDER — DROPERIDOL 2.5 MG/ML IJ SOLN: 0.6250 mg | Freq: Once | INTRAMUSCULAR | Status: DC | PRN

## 2023-10-16 MED ORDER — PROPOFOL 10 MG/ML IV BOLUS
INTRAVENOUS | Status: DC | PRN
  Administered 2023-10-16: 140 mg via INTRAVENOUS
  Administered 2023-10-16: 40 mg via INTRAVENOUS

## 2023-10-16 MED ORDER — ACETAMINOPHEN 500 MG PO TABS
1000.0000 mg | ORAL_TABLET | Freq: Once | ORAL | Status: AC
  Administered 2023-10-16: 1000 mg via ORAL
  Filled 2023-10-16: qty 2

## 2023-10-16 MED ORDER — HEPARIN SODIUM (PORCINE) 1000 UNIT/ML IJ SOLN
INTRAMUSCULAR | Status: DC | PRN
  Administered 2023-10-16 (×2): 1000 [IU] via INTRAVENOUS
  Administered 2023-10-16: 3000 [IU] via INTRAVENOUS

## 2023-10-16 MED ORDER — SODIUM CHLORIDE 0.9 % IV SOLN: INTRAVENOUS | Status: DC

## 2023-10-16 MED ORDER — CLEVIDIPINE BUTYRATE 0.5 MG/ML IV EMUL
INTRAVENOUS | Status: AC
  Filled 2023-10-16: qty 100

## 2023-10-16 MED ORDER — CLOPIDOGREL BISULFATE 75 MG PO TABS
75.0000 mg | ORAL_TABLET | Freq: Every day | ORAL | Status: DC
  Administered 2023-10-17 – 2023-10-18 (×2): 75 mg via ORAL
  Filled 2023-10-16 (×2): qty 1

## 2023-10-16 MED ORDER — EPTIFIBATIDE 20 MG/10ML IV SOLN
INTRAVENOUS | Status: AC | PRN
  Administered 2023-10-16 (×2): 1.5 mg via INTRA_ARTERIAL

## 2023-10-16 MED ORDER — NIMODIPINE 30 MG PO CAPS
0.0000 mg | ORAL_CAPSULE | ORAL | Status: AC
  Administered 2023-10-16: 30 mg via ORAL
  Filled 2023-10-16: qty 1

## 2023-10-16 MED ORDER — FENTANYL CITRATE (PF) 100 MCG/2ML IJ SOLN: 25.0000 ug | INTRAMUSCULAR | Status: DC | PRN

## 2023-10-16 MED ORDER — SODIUM CHLORIDE 0.9 % IV SOLN: INTRAVENOUS | Status: AC

## 2023-10-16 MED ORDER — CLOPIDOGREL BISULFATE 75 MG PO TABS: 75.0000 mg | ORAL_TABLET | Freq: Every day | ORAL | Status: DC

## 2023-10-16 MED ORDER — HEPARIN (PORCINE) 25000 UT/250ML-% IV SOLN
INTRAVENOUS | Status: AC
  Filled 2023-10-16: qty 250

## 2023-10-16 MED ORDER — FENTANYL CITRATE PF 50 MCG/ML IJ SOSY: 12.5000 ug | PREFILLED_SYRINGE | Freq: Four times a day (QID) | INTRAMUSCULAR | Status: DC | PRN

## 2023-10-16 NOTE — Progress Notes (Signed)
Late entry for 0700 AFter wiping down with the CHG wipes, the patient developed itching, some redness on BUE/LLE, and slight hive on L Knee.  Dr. Hart Rochester made aware and benadryl ordered.  0800  Benadryl has been effective in relieving patient's symptoms.

## 2023-10-16 NOTE — Progress Notes (Signed)
IR was notified that the pt had a hematoma forming at the right groin site at 1250 hrs. I came up to the room at this time and noticed a small nodule lateral to the puncture site. I then removed the old quikclot/dressing and applied a new quikclot. Manual pressure was held starting at 1300 hrs and hemostasis was achieved at 1315 hrs. Hemostasis was confirmed by the RN and myself. Both of [place] noted that there still was a small nodule at the groin site but it appeared to be better than what it was initially. Groin/distal pulses were all palpable at this time. Gauze/tegaderm was then applied to the site. The quikclot dressing will need to come off after 24 hrs.

## 2023-10-16 NOTE — H&P (Deleted)
The note originally documented on this encounter has been moved the the encounter in which it belongs.

## 2023-10-16 NOTE — Anesthesia Procedure Notes (Signed)
Arterial Line Insertion Start/End1/22/2025 8:42 AM, 10/16/2023 8:44 AM Performed by: Shelton Silvas, MD, anesthesiologist  Patient location: Pre-op. Preanesthetic checklist: patient identified, IV checked, site marked, risks and benefits discussed, surgical consent, monitors and equipment checked, pre-op evaluation, timeout performed and anesthesia consent Lidocaine 1% used for infiltration Left, radial was placed Catheter size: 20 G Hand hygiene performed  and maximum sterile barriers used   Attempts: 1 Procedure performed without using ultrasound guided technique. Following insertion, dressing applied and Biopatch. Post procedure assessment: normal and unchanged  Post procedure complications: second provider assisted. Patient tolerated the procedure well with no immediate complications. Additional procedure comments: CRNA x2.

## 2023-10-16 NOTE — Anesthesia Procedure Notes (Signed)
Procedure Name: Intubation Date/Time: 10/16/2023 8:58 AM  Performed by: Alease Medina, CRNAPre-anesthesia Checklist: Patient identified, Emergency Drugs available, Suction available and Patient being monitored Patient Re-evaluated:Patient Re-evaluated prior to induction Oxygen Delivery Method: Circle system utilized Preoxygenation: Pre-oxygenation with 100% oxygen Induction Type: IV induction Ventilation: Mask ventilation without difficulty Laryngoscope Size: Mac and 3 Grade View: Grade I Tube type: Oral Tube size: 7.0 mm Number of attempts: 1 Airway Equipment and Method: Stylet and Oral airway Placement Confirmation: ETT inserted through vocal cords under direct vision, positive ETCO2 and breath sounds checked- equal and bilateral Secured at: 22 cm Tube secured with: Tape Dental Injury: Teeth and Oropharynx as per pre-operative assessment

## 2023-10-16 NOTE — Anesthesia Postprocedure Evaluation (Signed)
Anesthesia Post Note  Patient: Diane [name]  Procedure(s) Performed: endovascular treatment of the right internal carotid artery tandem intracranial stenosis     Patient location during evaluation: PACU Anesthesia Type: General Level of consciousness: awake and alert Pain management: pain level controlled Vital Signs Assessment: post-procedure vital signs reviewed and stable Respiratory status: spontaneous breathing, nonlabored ventilation, respiratory function stable and patient connected to nasal cannula oxygen Cardiovascular status: blood pressure returned to baseline and stable Postop Assessment: no apparent nausea or vomiting Anesthetic complications: no  No notable events documented.  Last Vitals:  Vitals:   10/16/23 1200 10/16/23 1215  BP: 123/78 124/76  Pulse: (!) 105 92  Resp: 18 15  Temp:    SpO2: 100% 100%    Last Pain:  Vitals:   10/16/23 1200  TempSrc:   PainSc: 0-No pain                 Shelton Silvas

## 2023-10-16 NOTE — Progress Notes (Signed)
PHARMACY - ANTICOAGULATION CONSULT NOTE  Pharmacy Consult for heparin  Indication:  post IR  Allergies  Allergen Reactions   Chlorhexidine Itching    Patient also had mild redness and hives in one location.  Recommended to pre medicate with Benadryl if needed.    Patient Measurements: Height: 5\' 8"  (172.7 cm) Weight: 83.9 kg (185 lb) IBW/kg (Calculated) : 63.9 Heparin Dosing Weight: 81 kg  Vital Signs: Temp: 99.3 F (37.4 C) (01/22 2000) Temp Source: Axillary (01/22 2000) BP: 127/77 (01/22 2000) Pulse Rate: 108 (01/22 2000)  Labs: Recent Labs    10/16/23 0720 10/16/23 1107 10/16/23 2025  HGB 12.2  --   --   HCT 37.4  --   --   PLT 249  --   --   LABPROT 13.2 15.0  --   INR 1.0 1.2  --   HEPARINUNFRC  --   --  <0.10*  CREATININE 0.74  --   --     Estimated Creatinine Clearance: 102.9 mL/min (by C-G formula based on SCr of 0.74 mg/dL).  Assessment: 43 yo patient with right internal carotid stenosis s/p balloon angioplasty and stent assisted angioplasty in IR. Heparin begun per radiology. Plan to stop at 0800 tomorrow for sheath removal. CBC stable, no signs of bleeding noted  Heparin level subtherapeutic at <0.1 on 500 units/hr. No problems with infusion or bleeding per RN.  Goal of Therapy:  Heparin level 0.1-0.25  Monitor platelets by anticoagulation protocol: Yes   Plan:  Increase heparin infusion to 650 units/hr  Heparin off at 08:00 tomorrow Monitor CBC and for s/sx of bleeding   Loura Back, PharmD, BCPS 9:35 PM

## 2023-10-16 NOTE — Sedation Documentation (Signed)
ACT 170 - MD Deveshwar notified, asked to repeat with a different machine.

## 2023-10-16 NOTE — Progress Notes (Signed)
Just after arrival to 4N from PACU pt developed hematoma MD notified pressure applied for 20 mins then IR staff assessed at bedside and redressed site with new quick clot. RLE pulses remain palpable +2. Heparin held for 1 hour. No new signs of bleeding.

## 2023-10-16 NOTE — Sedation Documentation (Addendum)
Patient transported to recovery area via stretcher with CRNA. Bedside report given to RN. Femoral site assessed - Level 0, no hematoma, dressing is clean, dry, and intact. Pulses also assessed bilaterally.

## 2023-10-16 NOTE — Sedation Documentation (Signed)
ACT=176.

## 2023-10-16 NOTE — Progress Notes (Signed)
PHARMACY - ANTICOAGULATION CONSULT NOTE  Pharmacy Consult for heparin  Indication:  post IR  Allergies  Allergen Reactions   Chlorhexidine Itching    Patient also had mild redness and hives in one location.  Recommended to pre medicate with Benadryl if needed.    Patient Measurements: Height: 5\' 8"  (172.7 cm) Weight: 83.9 kg (185 lb) IBW/kg (Calculated) : 63.9 Heparin Dosing Weight: 84 kg  Vital Signs: Temp: 98.7 F (37.1 C) (01/22 1215) Temp Source: Oral (01/22 0626) BP: 124/76 (01/22 1215) Pulse Rate: 92 (01/22 1215)  Labs: Recent Labs    10/16/23 0720 10/16/23 1107  HGB 12.2  --   HCT 37.4  --   PLT 249  --   LABPROT 13.2 15.0  INR 1.0 1.2  CREATININE 0.74  --     Estimated Creatinine Clearance: 102.9 mL/min (by C-G formula based on SCr of 0.74 mg/dL).   Medical History: Past Medical History:  Diagnosis Date   ADD (attention deficit disorder)    Anemia    Anxiety    Crohn's disease (HCC)    D-dimer, elevated    Fibroid    Headache(784.0)    otc meds - last migraine 2009   Heart murmur    as child, never had any problems   History of endometriosis    Hypertension    Leiomyoma 02/09/2015   Low blood potassium    history   Migraine without aura, with intractable migraine, so stated, without mention of status migrainosus 06/19/2013   Pneumonia    x5   PONV (postoperative nausea and vomiting)    Seasonal allergies    Vaginal Pap smear, abnormal    Vasovagal syncope     Medications:  Scheduled:  Infusions:   clevidipine     clevidipine 6 mg/hr (10/16/23 1153)   heparin     heparin      Assessment: 43 yo patient with right internal carotid stenosis s/p balloon angioplasty and stent assisted angioplasty in IR. Heparin begun per radiology. Plan to stop at 0800 tomorrow for sheath removal. CBC stable, no signs of bleeding noted  Goal of Therapy:  Heparin level 0.1-0.25  Monitor platelets by anticoagulation protocol: Yes   Plan:  Start  heparin infusion at 500 units/hr  8 hour heparin level  Monitor CBC and for s/sx of bleeding   Maycol Hoying I [place] Esch 10/16/2023,12:41 PM

## 2023-10-16 NOTE — H&P (Signed)
Chief Complaint: Patient was seen in consultation today for Right internal carotid stenosis-- for possible intervention    Supervising Physician: Julieanne Cotton  Patient Status: [facility] - Out-pt  History of Present Illness: Diane [name] is a 43 y.o. female   FULL Code status per pt  CVA 09/13/23; headaches Work up found R ICA stenosis 1/6/angio: IMPRESSION: High-grade stenosis of the right internal carotid artery at the petrous cavernous junction, and the supraclinoid junction with impeded hemodynamic flow distally into the right MCA distribution. Probable high-grade stenosis or of the right sigmoid sinus transverse sinus junction, with an approximately 4.6 mm vertically oriented slit-like width at the left transverse sinus sigmoid sinus junction.  Seen in consultation with Dr Corliss Skains 10/03/2023 The patient underwent an uneventful diagnostic catheter arteriogram on 09/30/2023. Again reviewed were the findings of tandem severe stenosis of the right internal carotid artery at the petrous cavernous junction, and in the supraclinoid region. The endovascular option of stent assisted angioplasty in order to prevent catastrophic right cerebral hemispheric ischemic stroke was reviewed in detail. The procedure would be under general anesthesia via either the trans radial or transfemoral route. The procedure was reviewed in detail including the attendant potential risk of a 2-3% chance of a new stroke, which could be transient, or significant and prolonged. Following the procedure, the patient would recover in the PACU followed by overnight stay in the neuro ICU for close neurologic observations. Questions were answered to the patient's satisfaction. The patient expressed her desire to proceed with endovascular treatment of the right internal carotid artery tandem intracranial stenosis under general anesthesia  Scheduled today for Cerebral arteriogram with possible R ICA  angioplasty/stent placement  Noted pt states she did get a few hives and mild itching from the wipes she was given in pre procedure area to use last night Resolving now--- almost completely after Benadryl po   Past Medical History:  Diagnosis Date   ADD (attention deficit disorder)    Anemia    Anxiety    Crohn's disease (HCC)    D-dimer, elevated    Fibroid    Headache(784.0)    otc meds - last migraine 2009   Heart murmur    as child, never had any problems   History of endometriosis    Hypertension    Leiomyoma 02/09/2015   Low blood potassium    history   Migraine without aura, with intractable migraine, so stated, without mention of status migrainosus 06/19/2013   Pneumonia    x5   PONV (postoperative nausea and vomiting)    Seasonal allergies    Vaginal Pap smear, abnormal    Vasovagal syncope     Past Surgical History:  Procedure Laterality Date   ADENOIDECTOMY  2014   CESAREAN SECTION N/A 04/14/2018   Procedure: PRIMARY CESAREAN SECTION;  Surgeon: Tilda Burrow, MD;  Location: [facility];  Service: Obstetrics;  Laterality: N/A;   DILATATION & CURRETTAGE/HYSTEROSCOPY WITH RESECTOCOPE N/A 06/09/2013   Procedure: DILATATION & CURETTAGE/HYSTEROSCOPY WITH HYSTEROSCOPIC RESECTION OF SUBMUCOSAL FIBROID AND ENDOMETRIAL POLYP;  Surgeon: Serita Kyle, MD;  Location: [facility];  Service: Gynecology;  Laterality: N/A;   IR ANGIO INTRA EXTRACRAN SEL COM CAROTID INNOMINATE BILAT MOD SED  09/30/2023   IR ANGIO VERTEBRAL SEL SUBCLAVIAN INNOMINATE UNI L MOD SED  09/30/2023   IR ANGIO VERTEBRAL SEL VERTEBRAL UNI R MOD SED  09/30/2023   IR RADIOLOGIST EVAL & MGMT  09/30/2023   IR RADIOLOGIST EVAL & MGMT  10/03/2023  IR US GUIDE VASC ACCESS RIGHT  09/30/2023   LAPAROSCOPY N/A 06/09/2013   Procedure: LAPAROSCOPY DIAGNOSTIC;  Surgeon: Serita Kyle, MD;  Location: [facility];  Service: Gynecology;  Laterality: N/A;   ROBOT ASSISTED MYOMECTOMY N/A 02/09/2015    Procedure: MYOMECTOMY,EXCISION OF ENDOMETRIOSIS,PRE-SACRAL NEURECTOMY;  Surgeon: Fermin Schwab, MD;  Location: [facility];  Service: Gynecology;  Laterality: N/A;   ROBOTIC ASSISTED LAPAROSCOPIC LYSIS OF ADHESION N/A 06/09/2013   Procedure: ROBOTIC ASSISTED LAPAROSCOPIC LYSIS OF ADHESION; Resection of Endometriosis and excision of fibroid;  Surgeon: Serita Kyle, MD;  Location: [facility];  Service: Gynecology;  Laterality: N/A;   TONSILLECTOMY  2014   TYMPANOSTOMY TUBE PLACEMENT  2014    Allergies: Patient has no known allergies.  Medications: Prior to Admission medications   Medication Sig Start Date End Date Taking? Authorizing Provider  acetaminophen (TYLENOL) 325 MG tablet Take 2 tablets (650 mg total) by mouth every 4 (four) hours as needed (for pain scale < 4). Patient not taking: Reported on 10/15/2023 04/17/18   [place] Bing, MD  ALPRAZolam Prudy Feeler) 0.5 MG tablet Take 0.5 mg by mouth at bedtime. 10/10/23   [provider]  amLODipine (NORVASC) 5 MG tablet Take 1 tablet (5 mg total) by mouth daily. 10/14/23   Paseda, Baird Kay, FNP  amphetamine-dextroamphetamine (ADDERALL) 30 MG tablet Take 30 mg by mouth daily. Patient not taking: Reported on 10/14/2023    [provider]  aspirin 81 MG chewable tablet Chew 1 tablet (81 mg total) by mouth daily. 09/13/23   Rancour, Jeannett Senior, MD  atorvastatin (LIPITOR) 20 MG tablet Take 1 tablet (20 mg total) by mouth daily. 10/15/23 10/14/24  Donell Beers, FNP  cetirizine (ZYRTEC ALLERGY) 10 MG tablet Take 1 tablet (10 mg total) by mouth daily. 10/14/23 04/11/24  Donell Beers, FNP  clopidogrel (PLAVIX) 75 MG tablet Take 1 tablet (75 mg total) by mouth daily. 10/03/23   Bruning, Caryn Bee, PA-C  ELDERBERRY PO Take 2 tablets by mouth daily at 2 am. Gummy    [provider]  gabapentin (NEURONTIN) 100 MG capsule Take 1 capsule (100 mg total) by mouth 3 (three) times daily. Patient not taking: Reported on 10/14/2023  09/28/23   Gareth Eagle, PA-C  lidocaine (LIDODERM) 5 % Place 1 patch onto the skin daily. Remove & Discard patch within 12 hours or as directed by MD Patient not taking: Reported on 10/14/2023 09/28/23   Glyn Ade, MD  ondansetron (ZOFRAN-ODT) 4 MG disintegrating tablet Take 1 tablet (4 mg total) by mouth every 8 (eight) hours as needed for nausea or vomiting. Patient not taking: Reported on 10/14/2023 09/15/23   Kommor, Wyn Forster, MD  predniSONE (DELTASONE) 10 MG tablet Take 5 tablets (50 mg total) by mouth daily for 3 days, THEN 4 tablets (40 mg total) daily for 3 days, THEN 3 tablets (30 mg total) daily for 3 days, THEN 2 tablets (20 mg total) daily for 3 days, THEN 1 tablet (10 mg total) daily for 3 days, THEN 0.5 tablets (5 mg total) daily for 3 days. Patient not taking: Reported on 10/15/2023 09/28/23 10/16/23  Glyn Ade, MD  Saline 0.65 % SOLN Place 1 spray into the nose daily.    [provider]     Family History  Problem Relation Age of Onset   Heart disease Mother 5       CHF   Crohn's disease Mother    Asthma Mother    Asthma Brother    Heart disease  Maternal Aunt    Diabetes Maternal Aunt    Stroke Maternal Aunt    Heart disease Maternal Grandfather    Stroke Maternal Grandfather     Social History   Socioeconomic History   Marital status: Divorced    Spouse name: Not on file   Number of children: 2   Years of education: some coll.   Highest education level: Not on file  Occupational History   Not on file  Tobacco Use   Smoking status: Never   Smokeless tobacco: Never  Vaping Use   Vaping status: Never Used  Substance and Sexual Activity   Alcohol use: Not Currently   Drug use: No   Sexual activity: Yes    Birth control/protection: None  Other Topics Concern   Not on file  Social History Narrative   Patient does not drink caffeine.   Patient is right handed.    Social Drivers of Corporate investment banker Strain: Not on file  Food  Insecurity: Not on file  Transportation Needs: Not on file  Physical Activity: Not on file  Stress: Not on file  Social Connections: Not on file    Review of Systems: A 12 point ROS discussed and pertinent positives are indicated in the HPI above.  All other systems are negative.  Review of Systems  Constitutional:  Negative for activity change, fatigue and fever.  HENT:  Negative for sneezing, sore throat and tinnitus.   Eyes:  Negative for visual disturbance.  Respiratory:  Negative for cough and shortness of breath.   Cardiovascular:  Negative for chest pain.  Gastrointestinal:  Negative for abdominal pain, diarrhea, nausea and vomiting.  Musculoskeletal:  Negative for back pain and gait problem.  Neurological:  Negative for dizziness, tremors, seizures, syncope, facial asymmetry, speech difficulty, weakness, light-headedness, numbness and headaches.  Psychiatric/Behavioral:  Negative for behavioral problems and confusion.     Vital Signs: LMP 10/02/2023     Physical Exam Vitals reviewed.  HENT:     Mouth/Throat:     Mouth: Mucous membranes are moist.  Cardiovascular:     Rate and Rhythm: Normal rate and regular rhythm.     Heart sounds: Normal heart sounds. No murmur heard. Pulmonary:     Effort: Pulmonary effort is normal.     Breath sounds: Normal breath sounds. No wheezing.  Abdominal:     Palpations: Abdomen is soft.     Tenderness: There is no abdominal tenderness.  Musculoskeletal:        General: No swelling or tenderness. Normal range of motion.     Cervical back: Normal range of motion.     Right lower leg: No edema.     Left lower leg: No edema.  Skin:    General: Skin is warm.  Neurological:     Mental Status: She is alert and oriented to person, place, and time.  Psychiatric:        Mood and Affect: Mood normal.        Behavior: Behavior normal.        Thought Content: Thought content normal.        Judgment: Judgment normal.     Imaging: IR  Radiologist Eval & Mgmt Result Date: 10/03/2023 EXAM: ESTABLISHED PATIENT OFFICE VISIT CHIEF COMPLAINT: Follow-up visit. Current Pain Level: 1-10 HISTORY OF PRESENT ILLNESS: 43 year old lady returns today in follow-up to discuss findings of recent diagnostic catheter arteriogram of the carotids and intracranially. The patient underwent an uneventful diagnostic catheter arteriogram on  09/30/2023. Again reviewed were the findings of tandem severe stenosis of the right internal carotid artery at the petrous cavernous junction, and in the supraclinoid region. The endovascular option of stent assisted angioplasty in order to prevent catastrophic right cerebral hemispheric ischemic stroke was reviewed in detail. The procedure would be under general anesthesia via either the trans radial or transfemoral route. The patient would have to be prepped with 300 mg loading dose of Plavix 5 days prior to this procedure followed by 75 mg per day thereafter. A P2Y12 will be obtained 3-4 days following the commencement of the Plavix. Should the patient be nonresponder, the patient was informed that Brilinta may have to be used instead. The procedure was reviewed in detail including the attendant potential risk of a 2-3% chance of a new stroke, which could be transient, or significant and prolonged. Following the procedure, the patient would recover in the PACU followed by overnight stay in the neuro ICU for close neurologic observations. Conservative management involving dual antiplatelets was also briefly reviewed. However, this would not result in any improvement in the caliber of the nearly pre-occlusive tandem stenosis. Questions were answered to the patient's satisfaction. The patient expressed her desire to proceed with endovascular treatment of the right internal carotid artery tandem intracranial stenosis under general anesthesia. This will be scheduled as soon as possible. In the meantime, the patient was reminded to  maintain adequate hydration and to continue with her baby aspirin and anti hypertensive medications. Past Medical History: Unchanged. Medications: Unchanged. Allergies: Unchanged. Social History: Unchanged. Family History: Unchanged. REVIEW OF SYSTEMS: Stable. PHYSICAL EXAMINATION: Neurologically stable non focal. ASSESSMENT AND PLAN: As above. Patient expressed understanding and agreement with the above management plan. Electronically Signed   By: Julieanne Cotton M.D.   On: 10/03/2023 08:04   IR ANGIO INTRA EXTRACRAN SEL COM CAROTID INNOMINATE BILAT MOD SED Result Date: 10/01/2023 CLINICAL DATA:  Patient with a history of syncope. Workup revealed a high-grade stenosis of the right middle cerebral artery M1 segment on CTA of the head and neck, and MRA of the brain. EXAM: BILATERAL COMMON CAROTID AND INNOMINATE ANGIOGRAPHY COMPARISON:  None Available. MEDICATIONS: Heparin 2000 units IV; no antibiotic was administered within 1 hour of the procedure. ANESTHESIA/SEDATION: Versed 2 mg IV mg IV; Fentanyl 100 mcg IV Moderate Sedation Time:  48 minutes The patient was continuously monitored during the procedure by the interventional radiology nurse under my direct supervision. CONTRAST:  Omnipaque 300 approximately 60 mL. FLUOROSCOPY TIME:  Fluoroscopy Time: 8 minutes 554 seconds (662 mGy). COMPLICATIONS: None immediate. TECHNIQUE: Informed written consent was obtained from the patient after a thorough discussion of the procedural risks, benefits and alternatives. All questions were addressed. Maximal Sterile Barrier Technique was utilized including caps, mask, sterile gowns, sterile gloves, sterile drape, hand hygiene and skin antiseptic. A timeout was performed prior to the initiation of the procedure. The right forearm to the wrist was then prepped and draped in the usual sterile fashion. The right radial artery was then identified with ultrasound, and its morphology documented in the radiology PACS system. A dorsal  palmar anastomosis was verified to be present. Using ultrasound guidance, access into the right radial artery was obtained over an 018 inch micro guidewire with a 4/5 French radial sheath. The obturator and the micro guidewire were removed. Good aspiration obtained from the side port of the radial sheath. A cocktail of 2000 units of heparin, 200 mcg of nitroglycerin, and 2.5 mg of verapamil was then infused in diluted  form without event. A right radial arteriogram was then performed. Over an 035 inch Roadrunner guidewire, a 5 French Simmons 2 diagnostic catheter was then advanced to the cortical region, and selectively positioned in the right vertebral artery, the right common carotid artery, the left common carotid artery and the left subclavian artery. A wrist band was then applied for hemostasis at the right groin puncture site at the end of the procedure. The right radial pulse was verified to be present. FINDINGS: The right common carotid arteriogram demonstrates the right external carotid artery and its major branches to be widely patent. The right internal carotid artery at the bulb to the cranial skull base demonstrates patency with modestly slow ascent of contrast to the skull base. There is a mild decrease in the caliber of the right ICA in the mid cervical region. A U-shaped tortuosity is evident at the junction of the middle third and distal third of the right ICA. The petrous segment demonstrates a severe stenosis of the petrous cavernous junction with a focal area of narrowing just distal to this. A high-grade stenosis is noted in the supraclinoid right ICA. Unopacified blood is seen in the distal right ICA at the terminus with opacification of the right MCA distribution with unopacified blood seen in the MCA distribution from the vertebral artery injection via the ipsilateral posterior communicating artery. The right vertebral artery origin is widely patent. The vessel is seen to opacify to the  cranial skull base. Approximately 50% stenosis is seen of the first horizontal segment of the right vertebral artery without intraluminal filling defects. Distal to this there is mild postop dilatation. Distal to this the right vertebrobasilar junction and the right posterior-inferior cerebellar artery demonstrate wide patency. The opacified portions of the basilar artery, the posterior cerebral arteries, the superior cerebellar arteries and the anterior-inferior cerebellar arteries demonstrate opacification into the capillary and venous phases. Unopacified blood is seen in the basilar artery from the contralateral vertebral artery. Also seen is prompt retrograde opacification of the distal supraclinoid right ICA via the posterior communicating artery with opacification seen of the right MCA distribution. The delayed arterial phase demonstrates poor filling of the right transverse sinus/sigmoid sinus junction indicative of a high-grade stenosis. Opacification is seen of the distal right sigmoid sinus and the right jugular bulb. The modestly dominant left vertebral artery origin is widely patent. The vessel has a moderate tortuosity in its mid cervical segment. More distally, opacification is seen of the left vertebrobasilar junction and the left posterior-inferior cerebellar artery. The basilar artery, the posterior cerebral arteries, the superior cerebellar arteries and the anterior-inferior cerebellar arteries is seen into the capillary and venous phases. Again seen is the prompt retrograde opacification of the right MCA distribution via the ipsilateral posterior communicating artery. The left common carotid arteriogram demonstrates the left external carotid artery and its major branches to be widely patent. The left internal carotid artery at the bulb to the cranial skull base demonstrates mild tortuosity. The petrous, cavernous and the supraclinoid left ICA are widely patent. A flash filling via the left  posterior communicating artery of the left posterior cerebral artery territory is seen. The left middle cerebral artery and left anterior cerebral artery opacify into the capillary and venous phases. The venous phase again demonstrates poor opacification at the right transverse sinus sigmoid sinus junction suggestive of high-grade stenosis. Also seen is an approximately 4.6 mm vertically oriented slit-like web at the left sigmoid sinus transverse sinus junction. IMPRESSION: High-grade stenosis of the right  internal carotid artery at the petrous cavernous junction, and the supraclinoid junction with impeded hemodynamic flow distally into the right MCA distribution. Probable high-grade stenosis or of the right sigmoid sinus transverse sinus junction, with an approximately 4.6 mm vertically oriented slit-like width at the left transverse sinus sigmoid sinus junction. PLAN: Findings reviewed with the patient and family member. Patient to return to discuss management options of the above angiographic findings. In the meantime, the patient has been advised to maintain adequate hydration and to continue with her present single aspirin antiplatelet regimen. Electronically Signed   By: Julieanne Cotton M.D.   On: 10/01/2023 07:58   IR US Guide Vasc Access Right Result Date: 10/01/2023 CLINICAL DATA:  Patient with a history of syncope. Workup revealed a high-grade stenosis of the right middle cerebral artery M1 segment on CTA of the head and neck, and MRA of the brain. EXAM: BILATERAL COMMON CAROTID AND INNOMINATE ANGIOGRAPHY COMPARISON:  None Available. MEDICATIONS: Heparin 2000 units IV; no antibiotic was administered within 1 hour of the procedure. ANESTHESIA/SEDATION: Versed 2 mg IV mg IV; Fentanyl 100 mcg IV Moderate Sedation Time:  48 minutes The patient was continuously monitored during the procedure by the interventional radiology nurse under my direct supervision. CONTRAST:  Omnipaque 300 approximately 60 mL.  FLUOROSCOPY TIME:  Fluoroscopy Time: 8 minutes 554 seconds (662 mGy). COMPLICATIONS: None immediate. TECHNIQUE: Informed written consent was obtained from the patient after a thorough discussion of the procedural risks, benefits and alternatives. All questions were addressed. Maximal Sterile Barrier Technique was utilized including caps, mask, sterile gowns, sterile gloves, sterile drape, hand hygiene and skin antiseptic. A timeout was performed prior to the initiation of the procedure. The right forearm to the wrist was then prepped and draped in the usual sterile fashion. The right radial artery was then identified with ultrasound, and its morphology documented in the radiology PACS system. A dorsal palmar anastomosis was verified to be present. Using ultrasound guidance, access into the right radial artery was obtained over an 018 inch micro guidewire with a 4/5 French radial sheath. The obturator and the micro guidewire were removed. Good aspiration obtained from the side port of the radial sheath. A cocktail of 2000 units of heparin, 200 mcg of nitroglycerin, and 2.5 mg of verapamil was then infused in diluted form without event. A right radial arteriogram was then performed. Over an 035 inch Roadrunner guidewire, a 5 French Simmons 2 diagnostic catheter was then advanced to the cortical region, and selectively positioned in the right vertebral artery, the right common carotid artery, the left common carotid artery and the left subclavian artery. A wrist band was then applied for hemostasis at the right groin puncture site at the end of the procedure. The right radial pulse was verified to be present. FINDINGS: The right common carotid arteriogram demonstrates the right external carotid artery and its major branches to be widely patent. The right internal carotid artery at the bulb to the cranial skull base demonstrates patency with modestly slow ascent of contrast to the skull base. There is a mild decrease in  the caliber of the right ICA in the mid cervical region. A U-shaped tortuosity is evident at the junction of the middle third and distal third of the right ICA. The petrous segment demonstrates a severe stenosis of the petrous cavernous junction with a focal area of narrowing just distal to this. A high-grade stenosis is noted in the supraclinoid right ICA. Unopacified blood is seen in the distal right  ICA at the terminus with opacification of the right MCA distribution with unopacified blood seen in the MCA distribution from the vertebral artery injection via the ipsilateral posterior communicating artery. The right vertebral artery origin is widely patent. The vessel is seen to opacify to the cranial skull base. Approximately 50% stenosis is seen of the first horizontal segment of the right vertebral artery without intraluminal filling defects. Distal to this there is mild postop dilatation. Distal to this the right vertebrobasilar junction and the right posterior-inferior cerebellar artery demonstrate wide patency. The opacified portions of the basilar artery, the posterior cerebral arteries, the superior cerebellar arteries and the anterior-inferior cerebellar arteries demonstrate opacification into the capillary and venous phases. Unopacified blood is seen in the basilar artery from the contralateral vertebral artery. Also seen is prompt retrograde opacification of the distal supraclinoid right ICA via the posterior communicating artery with opacification seen of the right MCA distribution. The delayed arterial phase demonstrates poor filling of the right transverse sinus/sigmoid sinus junction indicative of a high-grade stenosis. Opacification is seen of the distal right sigmoid sinus and the right jugular bulb. The modestly dominant left vertebral artery origin is widely patent. The vessel has a moderate tortuosity in its mid cervical segment. More distally, opacification is seen of the left vertebrobasilar  junction and the left posterior-inferior cerebellar artery. The basilar artery, the posterior cerebral arteries, the superior cerebellar arteries and the anterior-inferior cerebellar arteries is seen into the capillary and venous phases. Again seen is the prompt retrograde opacification of the right MCA distribution via the ipsilateral posterior communicating artery. The left common carotid arteriogram demonstrates the left external carotid artery and its major branches to be widely patent. The left internal carotid artery at the bulb to the cranial skull base demonstrates mild tortuosity. The petrous, cavernous and the supraclinoid left ICA are widely patent. A flash filling via the left posterior communicating artery of the left posterior cerebral artery territory is seen. The left middle cerebral artery and left anterior cerebral artery opacify into the capillary and venous phases. The venous phase again demonstrates poor opacification at the right transverse sinus sigmoid sinus junction suggestive of high-grade stenosis. Also seen is an approximately 4.6 mm vertically oriented slit-like web at the left sigmoid sinus transverse sinus junction. IMPRESSION: High-grade stenosis of the right internal carotid artery at the petrous cavernous junction, and the supraclinoid junction with impeded hemodynamic flow distally into the right MCA distribution. Probable high-grade stenosis or of the right sigmoid sinus transverse sinus junction, with an approximately 4.6 mm vertically oriented slit-like width at the left transverse sinus sigmoid sinus junction. PLAN: Findings reviewed with the patient and family member. Patient to return to discuss management options of the above angiographic findings. In the meantime, the patient has been advised to maintain adequate hydration and to continue with her present single aspirin antiplatelet regimen. Electronically Signed   By: Julieanne Cotton M.D.   On: 10/01/2023 07:58   IR  ANGIO VERTEBRAL SEL VERTEBRAL UNI R MOD SED Result Date: 10/01/2023 CLINICAL DATA:  Patient with a history of syncope. Workup revealed a high-grade stenosis of the right middle cerebral artery M1 segment on CTA of the head and neck, and MRA of the brain. EXAM: BILATERAL COMMON CAROTID AND INNOMINATE ANGIOGRAPHY COMPARISON:  None Available. MEDICATIONS: Heparin 2000 units IV; no antibiotic was administered within 1 hour of the procedure. ANESTHESIA/SEDATION: Versed 2 mg IV mg IV; Fentanyl 100 mcg IV Moderate Sedation Time:  48 minutes The patient was continuously  monitored during the procedure by the interventional radiology nurse under my direct supervision. CONTRAST:  Omnipaque 300 approximately 60 mL. FLUOROSCOPY TIME:  Fluoroscopy Time: 8 minutes 554 seconds (662 mGy). COMPLICATIONS: None immediate. TECHNIQUE: Informed written consent was obtained from the patient after a thorough discussion of the procedural risks, benefits and alternatives. All questions were addressed. Maximal Sterile Barrier Technique was utilized including caps, mask, sterile gowns, sterile gloves, sterile drape, hand hygiene and skin antiseptic. A timeout was performed prior to the initiation of the procedure. The right forearm to the wrist was then prepped and draped in the usual sterile fashion. The right radial artery was then identified with ultrasound, and its morphology documented in the radiology PACS system. A dorsal palmar anastomosis was verified to be present. Using ultrasound guidance, access into the right radial artery was obtained over an 018 inch micro guidewire with a 4/5 French radial sheath. The obturator and the micro guidewire were removed. Good aspiration obtained from the side port of the radial sheath. A cocktail of 2000 units of heparin, 200 mcg of nitroglycerin, and 2.5 mg of verapamil was then infused in diluted form without event. A right radial arteriogram was then performed. Over an 035 inch Roadrunner  guidewire, a 5 French Simmons 2 diagnostic catheter was then advanced to the cortical region, and selectively positioned in the right vertebral artery, the right common carotid artery, the left common carotid artery and the left subclavian artery. A wrist band was then applied for hemostasis at the right groin puncture site at the end of the procedure. The right radial pulse was verified to be present. FINDINGS: The right common carotid arteriogram demonstrates the right external carotid artery and its major branches to be widely patent. The right internal carotid artery at the bulb to the cranial skull base demonstrates patency with modestly slow ascent of contrast to the skull base. There is a mild decrease in the caliber of the right ICA in the mid cervical region. A U-shaped tortuosity is evident at the junction of the middle third and distal third of the right ICA. The petrous segment demonstrates a severe stenosis of the petrous cavernous junction with a focal area of narrowing just distal to this. A high-grade stenosis is noted in the supraclinoid right ICA. Unopacified blood is seen in the distal right ICA at the terminus with opacification of the right MCA distribution with unopacified blood seen in the MCA distribution from the vertebral artery injection via the ipsilateral posterior communicating artery. The right vertebral artery origin is widely patent. The vessel is seen to opacify to the cranial skull base. Approximately 50% stenosis is seen of the first horizontal segment of the right vertebral artery without intraluminal filling defects. Distal to this there is mild postop dilatation. Distal to this the right vertebrobasilar junction and the right posterior-inferior cerebellar artery demonstrate wide patency. The opacified portions of the basilar artery, the posterior cerebral arteries, the superior cerebellar arteries and the anterior-inferior cerebellar arteries demonstrate opacification into the  capillary and venous phases. Unopacified blood is seen in the basilar artery from the contralateral vertebral artery. Also seen is prompt retrograde opacification of the distal supraclinoid right ICA via the posterior communicating artery with opacification seen of the right MCA distribution. The delayed arterial phase demonstrates poor filling of the right transverse sinus/sigmoid sinus junction indicative of a high-grade stenosis. Opacification is seen of the distal right sigmoid sinus and the right jugular bulb. The modestly dominant left vertebral artery origin is widely  patent. The vessel has a moderate tortuosity in its mid cervical segment. More distally, opacification is seen of the left vertebrobasilar junction and the left posterior-inferior cerebellar artery. The basilar artery, the posterior cerebral arteries, the superior cerebellar arteries and the anterior-inferior cerebellar arteries is seen into the capillary and venous phases. Again seen is the prompt retrograde opacification of the right MCA distribution via the ipsilateral posterior communicating artery. The left common carotid arteriogram demonstrates the left external carotid artery and its major branches to be widely patent. The left internal carotid artery at the bulb to the cranial skull base demonstrates mild tortuosity. The petrous, cavernous and the supraclinoid left ICA are widely patent. A flash filling via the left posterior communicating artery of the left posterior cerebral artery territory is seen. The left middle cerebral artery and left anterior cerebral artery opacify into the capillary and venous phases. The venous phase again demonstrates poor opacification at the right transverse sinus sigmoid sinus junction suggestive of high-grade stenosis. Also seen is an approximately 4.6 mm vertically oriented slit-like web at the left sigmoid sinus transverse sinus junction. IMPRESSION: High-grade stenosis of the right internal carotid  artery at the petrous cavernous junction, and the supraclinoid junction with impeded hemodynamic flow distally into the right MCA distribution. Probable high-grade stenosis or of the right sigmoid sinus transverse sinus junction, with an approximately 4.6 mm vertically oriented slit-like width at the left transverse sinus sigmoid sinus junction. PLAN: Findings reviewed with the patient and family member. Patient to return to discuss management options of the above angiographic findings. In the meantime, the patient has been advised to maintain adequate hydration and to continue with her present single aspirin antiplatelet regimen. Electronically Signed   By: Julieanne Cotton M.D.   On: 10/01/2023 07:58   IR ANGIO VERTEBRAL SEL SUBCLAVIAN INNOMINATE UNI L MOD SED Result Date: 10/01/2023 CLINICAL DATA:  Patient with a history of syncope. Workup revealed a high-grade stenosis of the right middle cerebral artery M1 segment on CTA of the head and neck, and MRA of the brain. EXAM: BILATERAL COMMON CAROTID AND INNOMINATE ANGIOGRAPHY COMPARISON:  None Available. MEDICATIONS: Heparin 2000 units IV; no antibiotic was administered within 1 hour of the procedure. ANESTHESIA/SEDATION: Versed 2 mg IV mg IV; Fentanyl 100 mcg IV Moderate Sedation Time:  48 minutes The patient was continuously monitored during the procedure by the interventional radiology nurse under my direct supervision. CONTRAST:  Omnipaque 300 approximately 60 mL. FLUOROSCOPY TIME:  Fluoroscopy Time: 8 minutes 554 seconds (662 mGy). COMPLICATIONS: None immediate. TECHNIQUE: Informed written consent was obtained from the patient after a thorough discussion of the procedural risks, benefits and alternatives. All questions were addressed. Maximal Sterile Barrier Technique was utilized including caps, mask, sterile gowns, sterile gloves, sterile drape, hand hygiene and skin antiseptic. A timeout was performed prior to the initiation of the procedure. The right  forearm to the wrist was then prepped and draped in the usual sterile fashion. The right radial artery was then identified with ultrasound, and its morphology documented in the radiology PACS system. A dorsal palmar anastomosis was verified to be present. Using ultrasound guidance, access into the right radial artery was obtained over an 018 inch micro guidewire with a 4/5 French radial sheath. The obturator and the micro guidewire were removed. Good aspiration obtained from the side port of the radial sheath. A cocktail of 2000 units of heparin, 200 mcg of nitroglycerin, and 2.5 mg of verapamil was then infused in diluted form without event. A  right radial arteriogram was then performed. Over an 035 inch Roadrunner guidewire, a 5 French Simmons 2 diagnostic catheter was then advanced to the cortical region, and selectively positioned in the right vertebral artery, the right common carotid artery, the left common carotid artery and the left subclavian artery. A wrist band was then applied for hemostasis at the right groin puncture site at the end of the procedure. The right radial pulse was verified to be present. FINDINGS: The right common carotid arteriogram demonstrates the right external carotid artery and its major branches to be widely patent. The right internal carotid artery at the bulb to the cranial skull base demonstrates patency with modestly slow ascent of contrast to the skull base. There is a mild decrease in the caliber of the right ICA in the mid cervical region. A U-shaped tortuosity is evident at the junction of the middle third and distal third of the right ICA. The petrous segment demonstrates a severe stenosis of the petrous cavernous junction with a focal area of narrowing just distal to this. A high-grade stenosis is noted in the supraclinoid right ICA. Unopacified blood is seen in the distal right ICA at the terminus with opacification of the right MCA distribution with unopacified blood  seen in the MCA distribution from the vertebral artery injection via the ipsilateral posterior communicating artery. The right vertebral artery origin is widely patent. The vessel is seen to opacify to the cranial skull base. Approximately 50% stenosis is seen of the first horizontal segment of the right vertebral artery without intraluminal filling defects. Distal to this there is mild postop dilatation. Distal to this the right vertebrobasilar junction and the right posterior-inferior cerebellar artery demonstrate wide patency. The opacified portions of the basilar artery, the posterior cerebral arteries, the superior cerebellar arteries and the anterior-inferior cerebellar arteries demonstrate opacification into the capillary and venous phases. Unopacified blood is seen in the basilar artery from the contralateral vertebral artery. Also seen is prompt retrograde opacification of the distal supraclinoid right ICA via the posterior communicating artery with opacification seen of the right MCA distribution. The delayed arterial phase demonstrates poor filling of the right transverse sinus/sigmoid sinus junction indicative of a high-grade stenosis. Opacification is seen of the distal right sigmoid sinus and the right jugular bulb. The modestly dominant left vertebral artery origin is widely patent. The vessel has a moderate tortuosity in its mid cervical segment. More distally, opacification is seen of the left vertebrobasilar junction and the left posterior-inferior cerebellar artery. The basilar artery, the posterior cerebral arteries, the superior cerebellar arteries and the anterior-inferior cerebellar arteries is seen into the capillary and venous phases. Again seen is the prompt retrograde opacification of the right MCA distribution via the ipsilateral posterior communicating artery. The left common carotid arteriogram demonstrates the left external carotid artery and its major branches to be widely patent. The  left internal carotid artery at the bulb to the cranial skull base demonstrates mild tortuosity. The petrous, cavernous and the supraclinoid left ICA are widely patent. A flash filling via the left posterior communicating artery of the left posterior cerebral artery territory is seen. The left middle cerebral artery and left anterior cerebral artery opacify into the capillary and venous phases. The venous phase again demonstrates poor opacification at the right transverse sinus sigmoid sinus junction suggestive of high-grade stenosis. Also seen is an approximately 4.6 mm vertically oriented slit-like web at the left sigmoid sinus transverse sinus junction. IMPRESSION: High-grade stenosis of the right internal carotid artery at  the petrous cavernous junction, and the supraclinoid junction with impeded hemodynamic flow distally into the right MCA distribution. Probable high-grade stenosis or of the right sigmoid sinus transverse sinus junction, with an approximately 4.6 mm vertically oriented slit-like width at the left transverse sinus sigmoid sinus junction. PLAN: Findings reviewed with the patient and family member. Patient to return to discuss management options of the above angiographic findings. In the meantime, the patient has been advised to maintain adequate hydration and to continue with her present single aspirin antiplatelet regimen. Electronically Signed   By: Julieanne Cotton M.D.   On: 10/01/2023 07:58   IR Radiologist Eval & Mgmt Result Date: 09/30/2023 EXAM: NEW PATIENT OFFICE VISIT CHIEF COMPLAINT: Abnormal CT angiogram of the head and neck revealing high-grade right ICA intracranial stenosis. Current Pain Level: 1-10 HISTORY OF PRESENT ILLNESS: The patient is a 43 year old right handed lady who has been referred for further management of a recently discovered severely stenotic right ICA intracranial segment. During workup for a severe headache and stroke on 09/13/2023, the patient underwent a CT  angiogram of the head and neck which revealed severe stenosis of the intracranial portion of the right internal carotid artery. Patient describes her headache at the time being sharp and stabbing with intermittent character. Was associated with photophobia and nausea but no vomiting. There was no associated symptoms of vertigo, paresthesias of the extremities, speech difficulties, or motor weakness. Patient denies a history of loss of awareness, or seizure-like activity. The patient denies having further episodes of severe right-sided headache since then. Denies history of seizures, or of vertigo, tinnitus, or gait imbalance, or difficulty swallowing liquids or solids. She denies symptoms of diplopia, amaurosis fugax, or of blindness. The patient reports no recent chest pain, shortness of breath or palpitations or pedal edema. Patient reports a recent history of coughing and occasional sputum production. She reports being treated for a right-sided pneumonia with antibiotics. Presently, however, she has no cough or sputum production or hemoptysis or wheezing. Appetite normal. Weight is steady. Denies any abdominal pains, constipation, diarrhea or melena. Denies any dysuria frequency of micturition, or hematuria. No recent chills, fever or rigors. Diagnosis * : Date . * : ADD (attention deficit disorder) * : . * : Anemia * : . * : Anxiety * : . * : Crohn's disease (HCC) * : . * : Fibroid * : . * : H/O blood clots * : * : tested positive but not located on doppler . * : Headache(784.0) * : * : otc meds - last migraine 2009 . * : Heart murmur * : * : as child, never had any problems . * : History of endometriosis * : . * : Leiomyoma * : 02/09/2015 . * : Low blood potassium * : * : history . * : Migraine without aura, with intractable migraine, so stated, without mention of status migrainosus * : 06/19/2013 . * : Pneumonia * : * : history back in 2006 . * : PONV (postoperative nausea and vomiting) * : . * : Seasonal  allergies * : . * : Vaginal Pap smear, abnormal * : . * : Vasovagal syncope * : : Patient Active Problem List * : Diagnosis * : Date Noted . * : Cervical dysplasia * : 11/30/2020 . * : Dysmenorrhea * : 05/19/2019 . * : H/O myomectomy * : 01/21/2018 . * : Low back pain radiating to left leg * : 10/04/2015 . * : Intractable migraine without aura * :  06/19/2013 . * : Vertigo * : 06/13/2013 . * : Headache * : 06/13/2013 . * : History of endometriosis * : 06/13/2013 . * : CROHN'S DISEASE-LARGE & SMALL INTESTINE * : 07/27/2009 . * : GASTROESOPHAGEAL REFLUX DISEASE, MILD * : 07/12/2008 Past Surgical History: Procedure * : Laterality * : Date . * : ADENOIDECTOMY * : * : . * : CESAREAN SECTION * : N/A * : 04/14/2018 * : Procedure: PRIMARY CESAREAN SECTION; Surgeon: Tilda Burrow, MD; Location: [facility]; Service: Obstetrics; Laterality: N/A; . * : DILATATION & CURRETTAGE/HYSTEROSCOPY WITH RESECTOCOPE * : N/A * : 06/09/2013 * : Procedure: DILATATION & CURETTAGE/HYSTEROSCOPY WITH HYSTEROSCOPIC RESECTION OF SUBMUCOSAL FIBROID AND ENDOMETRIAL POLYP; Surgeon: Serita Kyle, MD; Location: [facility]; Service: Gynecology; Laterality: N/A; . * : LAPAROSCOPY * : N/A * : 06/09/2013 * : Procedure: LAPAROSCOPY DIAGNOSTIC; Surgeon: Serita Kyle, MD; Location: [facility]; Service: Gynecology; Laterality: N/A; . * : ROBOT ASSISTED MYOMECTOMY * : N/A * : 02/09/2015 * : Procedure: MYOMECTOMY,EXCISION OF ENDOMETRIOSIS,PRE-SACRAL NEURECTOMY; Surgeon: Fermin Schwab, MD; Location: [facility]; Service: Gynecology; Laterality: N/A; . * : ROBOTIC ASSISTED LAPAROSCOPIC LYSIS OF ADHESION * : N/A * : 06/09/2013 * : Procedure: ROBOTIC ASSISTED LAPAROSCOPIC LYSIS OF ADHESION; Resection of Endometriosis and excision of fibroid; Surgeon: Serita Kyle, MD; Location: [facility]; Service: Gynecology; Laterality: N/A; . * : TONSILLECTOMY * : * : . * : TYMPANOSTOMY TUBE PLACEMENT * : * : Home Medications Prior to Admission medications  Medication * : Sig * : Start Date * : End Date * : Taking? * : Authorizing Provider acetaminophen (TYLENOL) 325 MG tablet * : Take 2 tablets (650 mg total) by mouth every 4 (four) hours as needed (for pain scale < 4). * : 04/17/18 * : * : * Blair Bing, MD amphetamine-dextroamphetamine (ADDERALL) 30 MG tablet * : Take 30 mg by mouth daily. * : * : * : * : [provider] cetirizine (ZYRTEC ALLERGY) 10 MG tablet * : Take 1 tablet (10 mg total) by mouth 2 (two) times daily. * : 08/11/22 * : 02/07/23 * : * : Theadora Rama Scales, PA-C fexofenadine (ALLEGRA) 180 MG tablet * : Take 1 tablet (180 mg total) by mouth daily. * : 08/11/22 * : 02/07/23 * : * : Theadora Rama Scales, PA-C ibuprofen (ADVIL) 400 MG tablet * : Take 1 tablet (400 mg total) by mouth every 8 (eight) hours as needed for up to 30 doses. * : 08/11/22 * : * : * : Theadora Rama Scales, PA-C naproxen (NAPROSYN) 500 MG tablet * : Take 1 tablet (500 mg total) by mouth 2 (two) times daily. * : 08/29/22 * : * : * : Theadora Rama Scales, PA-C Allergies           Patient has no known allergies. Family History Family History Problem * : Relation * : Age of Onset . * : Heart disease * : Mother * : 78 * :     CHF . * : Crohn's disease * : Mother * : . * : Asthma * : Mother * : . * : Asthma * : Brother * : . * : Heart disease * : Maternal Aunt * : . * : Diabetes * : Maternal Aunt * : . * : Stroke * : Maternal Aunt * : . * : Heart disease * : Maternal Grandfather * : . * : Stroke * : Maternal Grandfather * :  Social History Tobacco Use.Smoking status:Never.Smokeless tobacco:NeverVaping Use.Vaping ZOX:WRUEA usedSubstance Use Topics.Alcohol use:No.Drug use:No REVIEW OF SYSTEMS: Negative unless as mentioned above. PHYSICAL EXAMINATION: Alert, awake, oriented to time, place, space. Speech and comprehension intact. Normal eye contact. Grossly no abnormal lateralizing neurological features evident. Station and gait normal. ASSESSMENT AND PLAN: The  patient's CT angiogram of the head and neck depicting the high-grade right ICA stenosis was shared with her. Brought to her attention was the intracranial nature of the high-grade stenosis. In order to more accurately evaluate the high-grade stenosis, a formal diagnostic catheter arteriogram via the trans radial or transfemoral route was discussed with the patient. The procedure, would be under conscious sedation. Following the procedure the results will be shared with patient with a further management plan depending on the diagnostic study. A small risk of ischemic stroke one in a thousand was reviewed with patient. Patient expressed understanding and agreement with the above management plan. Diagnostic arteriogram will be scheduled as soon as possible. Patient advised to maintain adequate hydration. Should she develop stroke-like symptoms, patient advised to call [phone]. Electronically Signed   By: Julieanne Cotton M.D.   On: 09/30/2023 08:11   DG Femur 1V Left Result Date: 09/28/2023 CLINICAL DATA:  Pain EXAM: LEFT FEMUR 1 VIEW COMPARISON:  Same day pelvic radiographs. FINDINGS: There is no evidence of fracture or other focal bone lesions. Soft tissues are unremarkable. IMPRESSION: Negative. Electronically Signed   By: Romona Curls M.D.   On: 09/28/2023 18:47   DG Pelvis 1-2 Views Result Date: 09/28/2023 CLINICAL DATA:  Pain EXAM: PELVIS - 1-2 VIEW COMPARISON:  Same day femur radiographs. FINDINGS: There is no evidence of pelvic fracture or diastasis. No pelvic bone lesions are seen. IMPRESSION: Negative. Electronically Signed   By: Romona Curls M.D.   On: 09/28/2023 18:46   MR LUMBAR SPINE WO CONTRAST Result Date: 09/28/2023 CLINICAL DATA:  Low back pain, no red flags, no prior management Left lower extremity with severe pain and possible weakness EXAM: MRI LUMBAR SPINE WITHOUT CONTRAST TECHNIQUE: Multiplanar, multisequence MR imaging of the lumbar spine was performed. No intravenous contrast was  administered. COMPARISON:  None Available. FINDINGS: Segmentation:  Standard. Alignment:  Physiologic. Vertebrae:  No fracture, evidence of discitis, or bone lesion. Conus medullaris and cauda equina: Conus extends to the L2 level. Conus and cauda equina appear normal. Paraspinal and other soft tissues: Small bilateral T2 hyperintense renal lesions are too small to accurately characterize, but likely represent simple renal cysts requiring no further imaging workup. Disc levels: T12-L1: Unremarkable L1-L2: Unremarkable L2-L3: Mild bilateral facet degenerative change. Minimal disc bulge. No significant spinal canal narrowing. No neural foraminal narrowing. L3-L4: Unremarkable L4-L5: Mild bilateral facet degenerative change, left-greater-than-right. Minimal disc bulge. No spinal canal narrowing. No neural foraminal narrowing. L5-S1: Circumferential disc bulge. No significant spinal canal narrowing. There is mild narrowing of the left lateral recess. Mild right and moderate left neural foraminal narrowing. IMPRESSION: 1. No acute abnormality. 2. Mild narrowing of the left lateral recess at L5-S1 with moderate left neuroforaminal narrowing. 3. No significant spinal canal narrowing. Electronically Signed   By: Lorenza Cambridge M.D.   On: 09/28/2023 15:23   US Venous Img Lower  Left (DVT Study) Result Date: 09/28/2023 CLINICAL DATA:  Left thigh pain for 1 day. EXAM: LEFT LOWER EXTREMITY VENOUS DOPPLER ULTRASOUND TECHNIQUE: Gray-scale sonography with compression, as well as color and duplex ultrasound, were performed to evaluate the deep venous system(s) from the level of the common femoral vein through the  popliteal and proximal calf veins. COMPARISON:  None Available. FINDINGS: VENOUS Normal compressibility of the common femoral, superficial femoral, and popliteal veins, as well as the visualized calf veins. Visualized portions of profunda femoral vein and great saphenous vein unremarkable. No filling defects to suggest  DVT on grayscale or color Doppler imaging. Doppler waveforms show normal direction of venous flow, normal respiratory plasticity and response to augmentation. Limited views of the contralateral common femoral vein are unremarkable. OTHER None. Limitations: none IMPRESSION: Negative. Electronically Signed   By: Romona Curls M.D.   On: 09/28/2023 09:40    Labs:  CBC: Recent Labs    09/14/23 2132 09/28/23 0846 09/30/23 0808 10/16/23 0720  WBC 7.8 11.2* 7.3 8.5  HGB 12.3 12.0 11.6* 12.2  HCT 35.6* 35.1* 34.9* 37.4  PLT 332 345 377 249    COAGS: Recent Labs    09/30/23 0808  INR 1.0    BMP: Recent Labs    09/13/23 0215 09/13/23 0417 09/14/23 2132 09/28/23 0846 09/30/23 0808  NA 135  --  137 136 138  K 2.6* 2.8* 2.9* 3.4* 3.6  CL 102  --  100 104 104  CO2 25  --  26 24 25   GLUCOSE 105*  --  97 110* 102*  BUN 10  --  7 15 19   CALCIUM 8.4*  --  8.6* 8.7* 8.5*  CREATININE 0.55  --  0.75 0.72 0.73  GFRNONAA >60  --  >60 >60 >60    LIVER FUNCTION TESTS: Recent Labs    09/13/23 0215 09/28/23 0846  BILITOT 0.6 0.4  AST 14* 15  ALT 15 14  ALKPHOS 80 82  PROT 6.9 7.3  ALBUMIN 3.4* 3.7    TUMOR MARKERS: No results for input(s): "AFPTM", "CEA", "CA199", "CHROMGRNA" in the last 8760 hours.  Assessment and Plan:  Scheduled today for Cerebral arteriogram with possible Right internal carotid artery angioplasty/stent placement Risks and benefits of cerebral angiogram with intervention were discussed with the patient including, but not limited to bleeding, infection, vascular injury, contrast induced renal failure, stroke or even death.  This interventional procedure involves the use of X-rays and because of the nature of the planned procedure, it is possible that we will have prolonged use of X-ray fluoroscopy.  Potential radiation risks to you include (but are not limited to) the following: - A slightly elevated risk for cancer  several years later in life. This risk  is typically less than 0.5% percent. This risk is low in comparison to the normal incidence of human cancer, which is 33% for women and 50% for men according to the [employer]. - Radiation induced injury can include skin redness, resembling a rash, tissue breakdown / ulcers and hair loss (which can be temporary or permanent).   The likelihood of either of these occurring depends on the difficulty of the procedure and whether you are sensitive to radiation due to previous procedures, disease, or genetic conditions.   IF your procedure requires a prolonged use of radiation, you will be notified and given written instructions for further action.  It is your responsibility to monitor the irradiated area for the 2 weeks following the procedure and to notify your physician if you are concerned that you have suffered a radiation induced injury.    All of the patient's questions were answered, patient is agreeable to proceed.  Consent signed and in chart.  Pt aware if intervention is performed-- she will be admitted to Neuro ICU overnight for  observation and plan to DC in am if stable She is agreeable  Thank you for this interesting consult.  I greatly enjoyed meeting Diane [name] and look forward to participating in their care.  A copy of this report was sent to the requesting provider on this date.  Electronically Signed: Robet Leu, PA-C 10/16/2023, 7:33 AM   I spent a total of  30 Minutes   in face to face in clinical consultation, greater than 50% of which was counseling/coordinating care for R ICA evaluation/ possible intervention

## 2023-10-16 NOTE — Anesthesia Preprocedure Evaluation (Addendum)
Anesthesia Evaluation  Patient identified by MRN, date of birth, ID band Patient awake    Reviewed: Allergy & Precautions, NPO status , Patient's Chart, lab work & pertinent test results  History of Anesthesia Complications (+) PONV and history of anesthetic complications  Airway Mallampati: I  TM Distance: >3 FB Neck ROM: Full    Dental  (+) Teeth Intact, Dental Advisory Given   Pulmonary    breath sounds clear to auscultation       Cardiovascular hypertension,  Rhythm:Regular Rate:Normal     Neuro/Psych  Headaches PSYCHIATRIC DISORDERS Anxiety        GI/Hepatic Neg liver ROS,GERD  ,,  Endo/Other  negative endocrine ROS    Renal/GU negative Renal ROS     Musculoskeletal negative musculoskeletal ROS (+)    Abdominal   Peds negative pediatric ROS (+)  Hematology  (+) Blood dyscrasia, anemia   Anesthesia Other Findings   Reproductive/Obstetrics                             Anesthesia Physical Anesthesia Plan  ASA: 3  Anesthesia Plan: General   Post-op Pain Management: Tylenol PO (pre-op)*   Induction: Intravenous  PONV Risk Score and Plan: 4 or greater and Ondansetron, Dexamethasone, Midazolam and Scopolamine patch - Pre-op  Airway Management Planned: Oral ETT  Additional Equipment: Arterial line  Intra-op Plan:   Post-operative Plan: Extubation in OR  Informed Consent: I have reviewed the patients History and Physical, chart, labs and discussed the procedure including the risks, benefits and alternatives for the proposed anesthesia with the patient or authorized representative who has indicated his/her understanding and acceptance.     Dental advisory given  Plan Discussed with: CRNA  Anesthesia Plan Comments:        Anesthesia Quick Evaluation

## 2023-10-16 NOTE — Progress Notes (Signed)
Orthopedic Tech Progress Note Patient Details:  Diane [name] 01/17/1981 [id number]  Ortho Devices Type of Ortho Device: Knee Immobilizer Ortho Device/Splint Location: RLE Ortho Device/Splint Interventions: Application   Post Interventions Patient Tolerated: Well  [name] 10/16/2023, 2:26 PM

## 2023-10-16 NOTE — Procedures (Signed)
INR.  Status post right common carotid arteriogram.  Right CFA approach.  Findings.  Tandem severe right internal carotid artery intracranial  stenoses Involving  the horizontal petrous segment, and the proximal cavernous segment.  Status post balloon angioplasty X one of the proximal cavernous segment, and stent assisted angioplasty of the horizontal petrous segment.  8 French Angio-Seal closure device deployed at the right groin puncture site postprocedure.  Distal pulses all present bilaterally unchanged from prior to  the procedure.  Post CT brain demonstrates no evidence of hemorrhagic  complications.   Patient extubated.  Denies any headaches ,nausea vomiting or visual symptoms.  Pupils of 3 to 4 mm sluggishly reactive bilaterally.  No facial asymmetry.  Tongue midline.  Moves all fours spontaneously and to command.  Fatima Sanger MD.

## 2023-10-16 NOTE — Sedation Documentation (Signed)
Repeat ACT 170 - MD Deveshwar notified,

## 2023-10-16 NOTE — Sedation Documentation (Signed)
 Patient moved to table by staff , secured, hooked to monitors, and now under the care of anesthesia. Please see charting in vitals per CRNA.

## 2023-10-16 NOTE — Transfer of Care (Signed)
Immediate Anesthesia Transfer of Care Note  Patient: Diane [name]  Procedure(s) Performed: endovascular treatment of the right internal carotid artery tandem intracranial stenosis  Patient Location: PACU  Anesthesia Type:General  Level of Consciousness: drowsy and patient cooperative  Airway & Oxygen Therapy: Patient Spontanous Breathing  Post-op Assessment: Report given to RN, Post -op Vital signs reviewed and stable, and Patient moving all extremities X 4  Post vital signs: Reviewed and stable  Last Vitals:  Vitals Value Taken Time  BP 135/87 10/16/23 1130  Temp    Pulse 97 10/16/23 1132  Resp 16 10/16/23 1134  SpO2 100 % 10/16/23 1132  Vitals shown include unfiled device data.  Last Pain:  Vitals:   10/16/23 0733  TempSrc:   PainSc: 0-No pain         Complications: No notable events documented.  Goal SBP 120-140 per Dr. Corliss Skains. Starting cleviprex per order. Dr. Hart Rochester made aware

## 2023-10-16 NOTE — Progress Notes (Signed)
Interventional Radiology Brief Note:  Patient assessed at bedside this afternoon alongside Dr. Corliss Skains s/p stent-assisted angioplasty of a severe right ICA stenosis of the horizontal petrous segment and the proximal cavernous segment.   Patient admitted for observation overnight.  She did have a hematoma at the right groin procedure site earlier today which resolved with manual compression. Currently in knee immobilizer with extended leg precautions.  Groin currently soft, mildly tender, no evidence of hematoma or pseudoaneurysm.  Neuro exam stable.  Complains of a mild-moderate headache, frontal.  Has been given toradol IV, Tylenol without impact.  She reports medication rarely helps her headaches.  Can have PRN dose tramadol if needed.   Will reassess tomorrow morning for possible discharge.   PT prior to discharge tomorrow due to 2nd floor apt/stairs.   Loyce Dys, MS RD PA-C 3:59 PM

## 2023-10-17 ENCOUNTER — Encounter (HOSPITAL_COMMUNITY): Payer: Self-pay | Admitting: Interventional Radiology

## 2023-10-17 LAB — CBC WITH DIFFERENTIAL/PLATELET
Abs Immature Granulocytes: 0.02 10*3/uL (ref 0.00–0.07)
Basophils Absolute: 0 10*3/uL (ref 0.0–0.1)
Basophils Relative: 0 %
Eosinophils Absolute: 0 10*3/uL (ref 0.0–0.5)
Eosinophils Relative: 0 %
HCT: 29.8 % — ABNORMAL LOW (ref 36.0–46.0)
Hemoglobin: 10 g/dL — ABNORMAL LOW (ref 12.0–15.0)
Immature Granulocytes: 0 %
Lymphocytes Relative: 20 %
Lymphs Abs: 1.3 10*3/uL (ref 0.7–4.0)
MCH: 29.4 pg (ref 26.0–34.0)
MCHC: 33.6 g/dL (ref 30.0–36.0)
MCV: 87.6 fL (ref 80.0–100.0)
Monocytes Absolute: 0.3 10*3/uL (ref 0.1–1.0)
Monocytes Relative: 5 %
Neutro Abs: 5.2 10*3/uL (ref 1.7–7.7)
Neutrophils Relative %: 75 %
Platelets: 234 10*3/uL (ref 150–400)
RBC: 3.4 MIL/uL — ABNORMAL LOW (ref 3.87–5.11)
RDW: 13.3 % (ref 11.5–15.5)
WBC: 6.9 10*3/uL (ref 4.0–10.5)
nRBC: 0 % (ref 0.0–0.2)

## 2023-10-17 LAB — BASIC METABOLIC PANEL
Anion gap: 8 (ref 5–15)
BUN: 7 mg/dL (ref 6–20)
CO2: 19 mmol/L — ABNORMAL LOW (ref 22–32)
Calcium: 7.5 mg/dL — ABNORMAL LOW (ref 8.9–10.3)
Chloride: 112 mmol/L — ABNORMAL HIGH (ref 98–111)
Creatinine, Ser: 0.69 mg/dL (ref 0.44–1.00)
GFR, Estimated: >60 mL/min (ref >60)
Glucose, Bld: 120 mg/dL — ABNORMAL HIGH (ref 70–99)
Potassium: 3.1 mmol/L — ABNORMAL LOW (ref 3.5–5.1)
Sodium: 139 mmol/L (ref 135–145)

## 2023-10-17 MED ORDER — SODIUM CHLORIDE 0.9 % IV SOLN: INTRAVENOUS | Status: DC

## 2023-10-17 MED ORDER — CETIRIZINE HCL 10 MG PO TABS
10.0000 mg | ORAL_TABLET | Freq: Every day | ORAL | Status: DC
  Administered 2023-10-17 – 2023-10-18 (×2): 10 mg via ORAL
  Filled 2023-10-17 (×2): qty 1

## 2023-10-17 MED ORDER — ALPRAZOLAM 0.25 MG PO TABS
0.5000 mg | ORAL_TABLET | Freq: Once | ORAL | Status: AC
  Administered 2023-10-17: 0.5 mg via ORAL
  Filled 2023-10-17: qty 2

## 2023-10-17 MED ORDER — POTASSIUM CHLORIDE IN NACL 20-0.9 MEQ/L-% IV SOLN
INTRAVENOUS | Status: DC
  Filled 2023-10-17: qty 1000

## 2023-10-17 MED ORDER — PREDNISONE 10 MG PO TABS: ORAL_TABLET | ORAL | 0 refills | Status: DC

## 2023-10-17 MED ORDER — SODIUM CHLORIDE 0.9 % IV BOLUS
250.0000 mL | Freq: Once | INTRAVENOUS | Status: AC
  Administered 2023-10-17: 250 mL via INTRAVENOUS

## 2023-10-17 MED ORDER — SALINE SPRAY 0.65 % NA SOLN
1.0000 | NASAL | Status: DC | PRN
  Administered 2023-10-18: 1 via NASAL
  Filled 2023-10-17: qty 44

## 2023-10-17 NOTE — Discharge Summary (Signed)
Patient ID: Diane [name] MRN: [id number] DOB/AGE: June 11, 1981 43 y.o.  Admit date: 10/16/2023 Discharge date: 10/17/2023  Supervising Physician: Julieanne Cotton  Patient Status: [name] - In-pt  Admission Diagnoses: Right ICA stenosis  Discharge Diagnoses:  Principal Problem:   Stenosis of intracranial portions of right internal carotid artery s/p Right ICA angioplasty and stent.   Discharged Condition: good  Hospital Course:  43 year old female with a history of right ICA stenosis s/p right ICA angioplasty and stent with Dr. Corliss Skains 10/16/23. She had an uneventful overnight observation with the exception of an ongoing headache and some transient fluctuations in her blood pressure. She was evaluated at the bedside this morning by Dr. Corliss Skains where the patient continued to endorse a moderate headache but was otherwise feeling ok. Right groin site is clean, soft and dry but tender to palpation. No evidence of a hematoma or pseudoaneurysm. She attempted to work with physical therapy but became too dizzy/lightheaded and was unable to complete her session. She received a 250 ml bolus of normal saline followed by an infusion of NS at 80 ml/hour.   The patient was re-evaluated by Dr. Corliss Skains early afternoon where she continued to endorse a moderate headache with positional light-headedness. The patient and her mother requested to stay another night in the hospital. The patient will remain for another night with tentative plans for discharge home 10/18/23. Her potassium this morning was 3.1. Dr. Corliss Skains discussed potassium replacement with the patient and she stated she is unable to take oral potassium in any form (liquid or tablet) because she will vomit. The patient's fluid was switched to NS with 20 meq potassium.   Outpatient orders have been placed for the patient to follow up with Dr. Corliss Skains in approximately 2 weeks. Activity restrictions were discussed including no bending, stooping,  driving or lifting greater than 10 lb for two weeks. She was advised to hold pressure over the right groin vascular site if she needs to bend, stoop, or lift her leg while climbing stairs. She is aware of the importance of taking Plavix and aspirin daily.   Consults: none  Significant Diagnostic Studies: IR Radiologist Eval & Mgmt Result Date: 10/03/2023 EXAM: ESTABLISHED PATIENT OFFICE VISIT CHIEF COMPLAINT: Follow-up visit. Current Pain Level: 1-10 HISTORY OF PRESENT ILLNESS: 43 year old lady returns today in follow-up to discuss findings of recent diagnostic catheter arteriogram of the carotids and intracranially. The patient underwent an uneventful diagnostic catheter arteriogram on 09/30/2023. Again reviewed were the findings of tandem severe stenosis of the right internal carotid artery at the petrous cavernous junction, and in the supraclinoid region. The endovascular option of stent assisted angioplasty in order to prevent catastrophic right cerebral hemispheric ischemic stroke was reviewed in detail. The procedure would be under general anesthesia via either the trans radial or transfemoral route. The patient would have to be prepped with 300 mg loading dose of Plavix 5 days prior to this procedure followed by 75 mg per day thereafter. A P2Y12 will be obtained 3-4 days following the commencement of the Plavix. Should the patient be nonresponder, the patient was informed that Brilinta may have to be used instead. The procedure was reviewed in detail including the attendant potential risk of a 2-3% chance of a new stroke, which could be transient, or significant and prolonged. Following the procedure, the patient would recover in the PACU followed by overnight stay in the neuro ICU for close neurologic observations. Conservative management involving dual antiplatelets was also briefly reviewed. However, this would not  result in any improvement in the caliber of the nearly pre-occlusive tandem stenosis.  Questions were answered to the patient's satisfaction. The patient expressed her desire to proceed with endovascular treatment of the right internal carotid artery tandem intracranial stenosis under general anesthesia. This will be scheduled as soon as possible. In the meantime, the patient was reminded to maintain adequate hydration and to continue with her baby aspirin and anti hypertensive medications. Past Medical History: Unchanged. Medications: Unchanged. Allergies: Unchanged. Social History: Unchanged. Family History: Unchanged. REVIEW OF SYSTEMS: Stable. PHYSICAL EXAMINATION: Neurologically stable non focal. ASSESSMENT AND PLAN: As above. Patient expressed understanding and agreement with the above management plan. Electronically Signed   By: Julieanne Cotton M.D.   On: 10/03/2023 08:04   IR ANGIO INTRA EXTRACRAN SEL COM CAROTID INNOMINATE BILAT MOD SED Result Date: 10/01/2023 CLINICAL DATA:  Patient with a history of syncope. Workup revealed a high-grade stenosis of the right middle cerebral artery M1 segment on CTA of the head and neck, and MRA of the brain. EXAM: BILATERAL COMMON CAROTID AND INNOMINATE ANGIOGRAPHY COMPARISON:  None Available. MEDICATIONS: Heparin 2000 units IV; no antibiotic was administered within 1 hour of the procedure. ANESTHESIA/SEDATION: Versed 2 mg IV mg IV; Fentanyl 100 mcg IV Moderate Sedation Time:  48 minutes The patient was continuously monitored during the procedure by the interventional radiology nurse under my direct supervision. CONTRAST:  Omnipaque 300 approximately 60 mL. FLUOROSCOPY TIME:  Fluoroscopy Time: 8 minutes 554 seconds (662 mGy). COMPLICATIONS: None immediate. TECHNIQUE: Informed written consent was obtained from the patient after a thorough discussion of the procedural risks, benefits and alternatives. All questions were addressed. Maximal Sterile Barrier Technique was utilized including caps, mask, sterile gowns, sterile gloves, sterile drape, hand  hygiene and skin antiseptic. A timeout was performed prior to the initiation of the procedure. The right forearm to the wrist was then prepped and draped in the usual sterile fashion. The right radial artery was then identified with ultrasound, and its morphology documented in the radiology PACS system. A dorsal palmar anastomosis was verified to be present. Using ultrasound guidance, access into the right radial artery was obtained over an 018 inch micro guidewire with a 4/5 French radial sheath. The obturator and the micro guidewire were removed. Good aspiration obtained from the side port of the radial sheath. A cocktail of 2000 units of heparin, 200 mcg of nitroglycerin, and 2.5 mg of verapamil was then infused in diluted form without event. A right radial arteriogram was then performed. Over an 035 inch Roadrunner guidewire, a 5 French Simmons 2 diagnostic catheter was then advanced to the cortical region, and selectively positioned in the right vertebral artery, the right common carotid artery, the left common carotid artery and the left subclavian artery. A wrist band was then applied for hemostasis at the right groin puncture site at the end of the procedure. The right radial pulse was verified to be present. FINDINGS: The right common carotid arteriogram demonstrates the right external carotid artery and its major branches to be widely patent. The right internal carotid artery at the bulb to the cranial skull base demonstrates patency with modestly slow ascent of contrast to the skull base. There is a mild decrease in the caliber of the right ICA in the mid cervical region. A U-shaped tortuosity is evident at the junction of the middle third and distal third of the right ICA. The petrous segment demonstrates a severe stenosis of the petrous cavernous junction with a focal area of narrowing  just distal to this. A high-grade stenosis is noted in the supraclinoid right ICA. Unopacified blood is seen in the  distal right ICA at the terminus with opacification of the right MCA distribution with unopacified blood seen in the MCA distribution from the vertebral artery injection via the ipsilateral posterior communicating artery. The right vertebral artery origin is widely patent. The vessel is seen to opacify to the cranial skull base. Approximately 50% stenosis is seen of the first horizontal segment of the right vertebral artery without intraluminal filling defects. Distal to this there is mild postop dilatation. Distal to this the right vertebrobasilar junction and the right posterior-inferior cerebellar artery demonstrate wide patency. The opacified portions of the basilar artery, the posterior cerebral arteries, the superior cerebellar arteries and the anterior-inferior cerebellar arteries demonstrate opacification into the capillary and venous phases. Unopacified blood is seen in the basilar artery from the contralateral vertebral artery. Also seen is prompt retrograde opacification of the distal supraclinoid right ICA via the posterior communicating artery with opacification seen of the right MCA distribution. The delayed arterial phase demonstrates poor filling of the right transverse sinus/sigmoid sinus junction indicative of a high-grade stenosis. Opacification is seen of the distal right sigmoid sinus and the right jugular bulb. The modestly dominant left vertebral artery origin is widely patent. The vessel has a moderate tortuosity in its mid cervical segment. More distally, opacification is seen of the left vertebrobasilar junction and the left posterior-inferior cerebellar artery. The basilar artery, the posterior cerebral arteries, the superior cerebellar arteries and the anterior-inferior cerebellar arteries is seen into the capillary and venous phases. Again seen is the prompt retrograde opacification of the right MCA distribution via the ipsilateral posterior communicating artery. The left common carotid  arteriogram demonstrates the left external carotid artery and its major branches to be widely patent. The left internal carotid artery at the bulb to the cranial skull base demonstrates mild tortuosity. The petrous, cavernous and the supraclinoid left ICA are widely patent. A flash filling via the left posterior communicating artery of the left posterior cerebral artery territory is seen. The left middle cerebral artery and left anterior cerebral artery opacify into the capillary and venous phases. The venous phase again demonstrates poor opacification at the right transverse sinus sigmoid sinus junction suggestive of high-grade stenosis. Also seen is an approximately 4.6 mm vertically oriented slit-like web at the left sigmoid sinus transverse sinus junction. IMPRESSION: High-grade stenosis of the right internal carotid artery at the petrous cavernous junction, and the supraclinoid junction with impeded hemodynamic flow distally into the right MCA distribution. Probable high-grade stenosis or of the right sigmoid sinus transverse sinus junction, with an approximately 4.6 mm vertically oriented slit-like width at the left transverse sinus sigmoid sinus junction. PLAN: Findings reviewed with the patient and family member. Patient to return to discuss management options of the above angiographic findings. In the meantime, the patient has been advised to maintain adequate hydration and to continue with her present single aspirin antiplatelet regimen. Electronically Signed   By: Julieanne Cotton M.D.   On: 10/01/2023 07:58   IR US Guide Vasc Access Right Result Date: 10/01/2023 CLINICAL DATA:  Patient with a history of syncope. Workup revealed a high-grade stenosis of the right middle cerebral artery M1 segment on CTA of the head and neck, and MRA of the brain. EXAM: BILATERAL COMMON CAROTID AND INNOMINATE ANGIOGRAPHY COMPARISON:  None Available. MEDICATIONS: Heparin 2000 units IV; no antibiotic was administered  within 1 hour of the procedure. ANESTHESIA/SEDATION:  Versed 2 mg IV mg IV; Fentanyl 100 mcg IV Moderate Sedation Time:  48 minutes The patient was continuously monitored during the procedure by the interventional radiology nurse under my direct supervision. CONTRAST:  Omnipaque 300 approximately 60 mL. FLUOROSCOPY TIME:  Fluoroscopy Time: 8 minutes 554 seconds (662 mGy). COMPLICATIONS: None immediate. TECHNIQUE: Informed written consent was obtained from the patient after a thorough discussion of the procedural risks, benefits and alternatives. All questions were addressed. Maximal Sterile Barrier Technique was utilized including caps, mask, sterile gowns, sterile gloves, sterile drape, hand hygiene and skin antiseptic. A timeout was performed prior to the initiation of the procedure. The right forearm to the wrist was then prepped and draped in the usual sterile fashion. The right radial artery was then identified with ultrasound, and its morphology documented in the radiology PACS system. A dorsal palmar anastomosis was verified to be present. Using ultrasound guidance, access into the right radial artery was obtained over an 018 inch micro guidewire with a 4/5 French radial sheath. The obturator and the micro guidewire were removed. Good aspiration obtained from the side port of the radial sheath. A cocktail of 2000 units of heparin, 200 mcg of nitroglycerin, and 2.5 mg of verapamil was then infused in diluted form without event. A right radial arteriogram was then performed. Over an 035 inch Roadrunner guidewire, a 5 French Simmons 2 diagnostic catheter was then advanced to the cortical region, and selectively positioned in the right vertebral artery, the right common carotid artery, the left common carotid artery and the left subclavian artery. A wrist band was then applied for hemostasis at the right groin puncture site at the end of the procedure. The right radial pulse was verified to be present. FINDINGS:  The right common carotid arteriogram demonstrates the right external carotid artery and its major branches to be widely patent. The right internal carotid artery at the bulb to the cranial skull base demonstrates patency with modestly slow ascent of contrast to the skull base. There is a mild decrease in the caliber of the right ICA in the mid cervical region. A U-shaped tortuosity is evident at the junction of the middle third and distal third of the right ICA. The petrous segment demonstrates a severe stenosis of the petrous cavernous junction with a focal area of narrowing just distal to this. A high-grade stenosis is noted in the supraclinoid right ICA. Unopacified blood is seen in the distal right ICA at the terminus with opacification of the right MCA distribution with unopacified blood seen in the MCA distribution from the vertebral artery injection via the ipsilateral posterior communicating artery. The right vertebral artery origin is widely patent. The vessel is seen to opacify to the cranial skull base. Approximately 50% stenosis is seen of the first horizontal segment of the right vertebral artery without intraluminal filling defects. Distal to this there is mild postop dilatation. Distal to this the right vertebrobasilar junction and the right posterior-inferior cerebellar artery demonstrate wide patency. The opacified portions of the basilar artery, the posterior cerebral arteries, the superior cerebellar arteries and the anterior-inferior cerebellar arteries demonstrate opacification into the capillary and venous phases. Unopacified blood is seen in the basilar artery from the contralateral vertebral artery. Also seen is prompt retrograde opacification of the distal supraclinoid right ICA via the posterior communicating artery with opacification seen of the right MCA distribution. The delayed arterial phase demonstrates poor filling of the right transverse sinus/sigmoid sinus junction indicative of a  high-grade stenosis. Opacification is seen  of the distal right sigmoid sinus and the right jugular bulb. The modestly dominant left vertebral artery origin is widely patent. The vessel has a moderate tortuosity in its mid cervical segment. More distally, opacification is seen of the left vertebrobasilar junction and the left posterior-inferior cerebellar artery. The basilar artery, the posterior cerebral arteries, the superior cerebellar arteries and the anterior-inferior cerebellar arteries is seen into the capillary and venous phases. Again seen is the prompt retrograde opacification of the right MCA distribution via the ipsilateral posterior communicating artery. The left common carotid arteriogram demonstrates the left external carotid artery and its major branches to be widely patent. The left internal carotid artery at the bulb to the cranial skull base demonstrates mild tortuosity. The petrous, cavernous and the supraclinoid left ICA are widely patent. A flash filling via the left posterior communicating artery of the left posterior cerebral artery territory is seen. The left middle cerebral artery and left anterior cerebral artery opacify into the capillary and venous phases. The venous phase again demonstrates poor opacification at the right transverse sinus sigmoid sinus junction suggestive of high-grade stenosis. Also seen is an approximately 4.6 mm vertically oriented slit-like web at the left sigmoid sinus transverse sinus junction. IMPRESSION: High-grade stenosis of the right internal carotid artery at the petrous cavernous junction, and the supraclinoid junction with impeded hemodynamic flow distally into the right MCA distribution. Probable high-grade stenosis or of the right sigmoid sinus transverse sinus junction, with an approximately 4.6 mm vertically oriented slit-like width at the left transverse sinus sigmoid sinus junction. PLAN: Findings reviewed with the patient and family member. Patient  to return to discuss management options of the above angiographic findings. In the meantime, the patient has been advised to maintain adequate hydration and to continue with her present single aspirin antiplatelet regimen. Electronically Signed   By: Julieanne Cotton M.D.   On: 10/01/2023 07:58   IR ANGIO VERTEBRAL SEL VERTEBRAL UNI R MOD SED Result Date: 10/01/2023 CLINICAL DATA:  Patient with a history of syncope. Workup revealed a high-grade stenosis of the right middle cerebral artery M1 segment on CTA of the head and neck, and MRA of the brain. EXAM: BILATERAL COMMON CAROTID AND INNOMINATE ANGIOGRAPHY COMPARISON:  None Available. MEDICATIONS: Heparin 2000 units IV; no antibiotic was administered within 1 hour of the procedure. ANESTHESIA/SEDATION: Versed 2 mg IV mg IV; Fentanyl 100 mcg IV Moderate Sedation Time:  48 minutes The patient was continuously monitored during the procedure by the interventional radiology nurse under my direct supervision. CONTRAST:  Omnipaque 300 approximately 60 mL. FLUOROSCOPY TIME:  Fluoroscopy Time: 8 minutes 554 seconds (662 mGy). COMPLICATIONS: None immediate. TECHNIQUE: Informed written consent was obtained from the patient after a thorough discussion of the procedural risks, benefits and alternatives. All questions were addressed. Maximal Sterile Barrier Technique was utilized including caps, mask, sterile gowns, sterile gloves, sterile drape, hand hygiene and skin antiseptic. A timeout was performed prior to the initiation of the procedure. The right forearm to the wrist was then prepped and draped in the usual sterile fashion. The right radial artery was then identified with ultrasound, and its morphology documented in the radiology PACS system. A dorsal palmar anastomosis was verified to be present. Using ultrasound guidance, access into the right radial artery was obtained over an 018 inch micro guidewire with a 4/5 French radial sheath. The obturator and the micro  guidewire were removed. Good aspiration obtained from the side port of the radial sheath. A cocktail of 2000 units of  heparin, 200 mcg of nitroglycerin, and 2.5 mg of verapamil was then infused in diluted form without event. A right radial arteriogram was then performed. Over an 035 inch Roadrunner guidewire, a 5 French Simmons 2 diagnostic catheter was then advanced to the cortical region, and selectively positioned in the right vertebral artery, the right common carotid artery, the left common carotid artery and the left subclavian artery. A wrist band was then applied for hemostasis at the right groin puncture site at the end of the procedure. The right radial pulse was verified to be present. FINDINGS: The right common carotid arteriogram demonstrates the right external carotid artery and its major branches to be widely patent. The right internal carotid artery at the bulb to the cranial skull base demonstrates patency with modestly slow ascent of contrast to the skull base. There is a mild decrease in the caliber of the right ICA in the mid cervical region. A U-shaped tortuosity is evident at the junction of the middle third and distal third of the right ICA. The petrous segment demonstrates a severe stenosis of the petrous cavernous junction with a focal area of narrowing just distal to this. A high-grade stenosis is noted in the supraclinoid right ICA. Unopacified blood is seen in the distal right ICA at the terminus with opacification of the right MCA distribution with unopacified blood seen in the MCA distribution from the vertebral artery injection via the ipsilateral posterior communicating artery. The right vertebral artery origin is widely patent. The vessel is seen to opacify to the cranial skull base. Approximately 50% stenosis is seen of the first horizontal segment of the right vertebral artery without intraluminal filling defects. Distal to this there is mild postop dilatation. Distal to this the  right vertebrobasilar junction and the right posterior-inferior cerebellar artery demonstrate wide patency. The opacified portions of the basilar artery, the posterior cerebral arteries, the superior cerebellar arteries and the anterior-inferior cerebellar arteries demonstrate opacification into the capillary and venous phases. Unopacified blood is seen in the basilar artery from the contralateral vertebral artery. Also seen is prompt retrograde opacification of the distal supraclinoid right ICA via the posterior communicating artery with opacification seen of the right MCA distribution. The delayed arterial phase demonstrates poor filling of the right transverse sinus/sigmoid sinus junction indicative of a high-grade stenosis. Opacification is seen of the distal right sigmoid sinus and the right jugular bulb. The modestly dominant left vertebral artery origin is widely patent. The vessel has a moderate tortuosity in its mid cervical segment. More distally, opacification is seen of the left vertebrobasilar junction and the left posterior-inferior cerebellar artery. The basilar artery, the posterior cerebral arteries, the superior cerebellar arteries and the anterior-inferior cerebellar arteries is seen into the capillary and venous phases. Again seen is the prompt retrograde opacification of the right MCA distribution via the ipsilateral posterior communicating artery. The left common carotid arteriogram demonstrates the left external carotid artery and its major branches to be widely patent. The left internal carotid artery at the bulb to the cranial skull base demonstrates mild tortuosity. The petrous, cavernous and the supraclinoid left ICA are widely patent. A flash filling via the left posterior communicating artery of the left posterior cerebral artery territory is seen. The left middle cerebral artery and left anterior cerebral artery opacify into the capillary and venous phases. The venous phase again  demonstrates poor opacification at the right transverse sinus sigmoid sinus junction suggestive of high-grade stenosis. Also seen is an approximately 4.6 mm vertically oriented slit-like  web at the left sigmoid sinus transverse sinus junction. IMPRESSION: High-grade stenosis of the right internal carotid artery at the petrous cavernous junction, and the supraclinoid junction with impeded hemodynamic flow distally into the right MCA distribution. Probable high-grade stenosis or of the right sigmoid sinus transverse sinus junction, with an approximately 4.6 mm vertically oriented slit-like width at the left transverse sinus sigmoid sinus junction. PLAN: Findings reviewed with the patient and family member. Patient to return to discuss management options of the above angiographic findings. In the meantime, the patient has been advised to maintain adequate hydration and to continue with her present single aspirin antiplatelet regimen. Electronically Signed   By: Julieanne Cotton M.D.   On: 10/01/2023 07:58   IR ANGIO VERTEBRAL SEL SUBCLAVIAN INNOMINATE UNI L MOD SED Result Date: 10/01/2023 CLINICAL DATA:  Patient with a history of syncope. Workup revealed a high-grade stenosis of the right middle cerebral artery M1 segment on CTA of the head and neck, and MRA of the brain. EXAM: BILATERAL COMMON CAROTID AND INNOMINATE ANGIOGRAPHY COMPARISON:  None Available. MEDICATIONS: Heparin 2000 units IV; no antibiotic was administered within 1 hour of the procedure. ANESTHESIA/SEDATION: Versed 2 mg IV mg IV; Fentanyl 100 mcg IV Moderate Sedation Time:  48 minutes The patient was continuously monitored during the procedure by the interventional radiology nurse under my direct supervision. CONTRAST:  Omnipaque 300 approximately 60 mL. FLUOROSCOPY TIME:  Fluoroscopy Time: 8 minutes 554 seconds (662 mGy). COMPLICATIONS: None immediate. TECHNIQUE: Informed written consent was obtained from the patient after a thorough discussion of  the procedural risks, benefits and alternatives. All questions were addressed. Maximal Sterile Barrier Technique was utilized including caps, mask, sterile gowns, sterile gloves, sterile drape, hand hygiene and skin antiseptic. A timeout was performed prior to the initiation of the procedure. The right forearm to the wrist was then prepped and draped in the usual sterile fashion. The right radial artery was then identified with ultrasound, and its morphology documented in the radiology PACS system. A dorsal palmar anastomosis was verified to be present. Using ultrasound guidance, access into the right radial artery was obtained over an 018 inch micro guidewire with a 4/5 French radial sheath. The obturator and the micro guidewire were removed. Good aspiration obtained from the side port of the radial sheath. A cocktail of 2000 units of heparin, 200 mcg of nitroglycerin, and 2.5 mg of verapamil was then infused in diluted form without event. A right radial arteriogram was then performed. Over an 035 inch Roadrunner guidewire, a 5 French Simmons 2 diagnostic catheter was then advanced to the cortical region, and selectively positioned in the right vertebral artery, the right common carotid artery, the left common carotid artery and the left subclavian artery. A wrist band was then applied for hemostasis at the right groin puncture site at the end of the procedure. The right radial pulse was verified to be present. FINDINGS: The right common carotid arteriogram demonstrates the right external carotid artery and its major branches to be widely patent. The right internal carotid artery at the bulb to the cranial skull base demonstrates patency with modestly slow ascent of contrast to the skull base. There is a mild decrease in the caliber of the right ICA in the mid cervical region. A U-shaped tortuosity is evident at the junction of the middle third and distal third of the right ICA. The petrous segment demonstrates a  severe stenosis of the petrous cavernous junction with a focal area of narrowing just distal to  this. A high-grade stenosis is noted in the supraclinoid right ICA. Unopacified blood is seen in the distal right ICA at the terminus with opacification of the right MCA distribution with unopacified blood seen in the MCA distribution from the vertebral artery injection via the ipsilateral posterior communicating artery. The right vertebral artery origin is widely patent. The vessel is seen to opacify to the cranial skull base. Approximately 50% stenosis is seen of the first horizontal segment of the right vertebral artery without intraluminal filling defects. Distal to this there is mild postop dilatation. Distal to this the right vertebrobasilar junction and the right posterior-inferior cerebellar artery demonstrate wide patency. The opacified portions of the basilar artery, the posterior cerebral arteries, the superior cerebellar arteries and the anterior-inferior cerebellar arteries demonstrate opacification into the capillary and venous phases. Unopacified blood is seen in the basilar artery from the contralateral vertebral artery. Also seen is prompt retrograde opacification of the distal supraclinoid right ICA via the posterior communicating artery with opacification seen of the right MCA distribution. The delayed arterial phase demonstrates poor filling of the right transverse sinus/sigmoid sinus junction indicative of a high-grade stenosis. Opacification is seen of the distal right sigmoid sinus and the right jugular bulb. The modestly dominant left vertebral artery origin is widely patent. The vessel has a moderate tortuosity in its mid cervical segment. More distally, opacification is seen of the left vertebrobasilar junction and the left posterior-inferior cerebellar artery. The basilar artery, the posterior cerebral arteries, the superior cerebellar arteries and the anterior-inferior cerebellar arteries is  seen into the capillary and venous phases. Again seen is the prompt retrograde opacification of the right MCA distribution via the ipsilateral posterior communicating artery. The left common carotid arteriogram demonstrates the left external carotid artery and its major branches to be widely patent. The left internal carotid artery at the bulb to the cranial skull base demonstrates mild tortuosity. The petrous, cavernous and the supraclinoid left ICA are widely patent. A flash filling via the left posterior communicating artery of the left posterior cerebral artery territory is seen. The left middle cerebral artery and left anterior cerebral artery opacify into the capillary and venous phases. The venous phase again demonstrates poor opacification at the right transverse sinus sigmoid sinus junction suggestive of high-grade stenosis. Also seen is an approximately 4.6 mm vertically oriented slit-like web at the left sigmoid sinus transverse sinus junction. IMPRESSION: High-grade stenosis of the right internal carotid artery at the petrous cavernous junction, and the supraclinoid junction with impeded hemodynamic flow distally into the right MCA distribution. Probable high-grade stenosis or of the right sigmoid sinus transverse sinus junction, with an approximately 4.6 mm vertically oriented slit-like width at the left transverse sinus sigmoid sinus junction. PLAN: Findings reviewed with the patient and family member. Patient to return to discuss management options of the above angiographic findings. In the meantime, the patient has been advised to maintain adequate hydration and to continue with her present single aspirin antiplatelet regimen. Electronically Signed   By: Julieanne Cotton M.D.   On: 10/01/2023 07:58   IR Radiologist Eval & Mgmt Result Date: 09/30/2023 EXAM: NEW PATIENT OFFICE VISIT CHIEF COMPLAINT: Abnormal CT angiogram of the head and neck revealing high-grade right ICA intracranial stenosis.  Current Pain Level: 1-10 HISTORY OF PRESENT ILLNESS: The patient is a 43 year old right handed lady who has been referred for further management of a recently discovered severely stenotic right ICA intracranial segment. During workup for a severe headache and stroke on 09/13/2023, the patient  underwent a CT angiogram of the head and neck which revealed severe stenosis of the intracranial portion of the right internal carotid artery. Patient describes her headache at the time being sharp and stabbing with intermittent character. Was associated with photophobia and nausea but no vomiting. There was no associated symptoms of vertigo, paresthesias of the extremities, speech difficulties, or motor weakness. Patient denies a history of loss of awareness, or seizure-like activity. The patient denies having further episodes of severe right-sided headache since then. Denies history of seizures, or of vertigo, tinnitus, or gait imbalance, or difficulty swallowing liquids or solids. She denies symptoms of diplopia, amaurosis fugax, or of blindness. The patient reports no recent chest pain, shortness of breath or palpitations or pedal edema. Patient reports a recent history of coughing and occasional sputum production. She reports being treated for a right-sided pneumonia with antibiotics. Presently, however, she has no cough or sputum production or hemoptysis or wheezing. Appetite normal. Weight is steady. Denies any abdominal pains, constipation, diarrhea or melena. Denies any dysuria frequency of micturition, or hematuria. No recent chills, fever or rigors. Diagnosis * : Date . * : ADD (attention deficit disorder) * : . * : Anemia * : . * : Anxiety * : . * : Crohn's disease (HCC) * : . * : Fibroid * : . * : H/O blood clots * : * : tested positive but not located on doppler . * : Headache(784.0) * : * : otc meds - last migraine 2009 . * : Heart murmur * : * : as child, never had any problems . * : History of endometriosis * :  . * : Leiomyoma * : 02/09/2015 . * : Low blood potassium * : * : history . * : Migraine without aura, with intractable migraine, so stated, without mention of status migrainosus * : 06/19/2013 . * : Pneumonia * : * : history back in 2006 . * : PONV (postoperative nausea and vomiting) * : . * : Seasonal allergies * : . * : Vaginal Pap smear, abnormal * : . * : Vasovagal syncope * : : Patient Active Problem List * : Diagnosis * : Date Noted . * : Cervical dysplasia * : 11/30/2020 . * : Dysmenorrhea * : 05/19/2019 . * : H/O myomectomy * : 01/21/2018 . * : Low back pain radiating to left leg * : 10/04/2015 . * : Intractable migraine without aura * : 06/19/2013 . * : Vertigo * : 06/13/2013 . * : Headache * : 06/13/2013 . * : History of endometriosis * : 06/13/2013 . * : CROHN'S DISEASE-LARGE & SMALL INTESTINE * : 07/27/2009 . * : GASTROESOPHAGEAL REFLUX DISEASE, MILD * : 07/12/2008 Past Surgical History: Procedure * : Laterality * : Date . * : ADENOIDECTOMY * : * : . * : CESAREAN SECTION * : N/A * : 04/14/2018 * : Procedure: PRIMARY CESAREAN SECTION; Surgeon: Tilda Burrow, MD; Location: [facility]; Service: Obstetrics; Laterality: N/A; . * : DILATATION & CURRETTAGE/HYSTEROSCOPY WITH RESECTOCOPE * : N/A * : 06/09/2013 * : Procedure: DILATATION & CURETTAGE/HYSTEROSCOPY WITH HYSTEROSCOPIC RESECTION OF SUBMUCOSAL FIBROID AND ENDOMETRIAL POLYP; Surgeon: Serita Kyle, MD; Location: [facility]; Service: Gynecology; Laterality: N/A; . * : LAPAROSCOPY * : N/A * : 06/09/2013 * : Procedure: LAPAROSCOPY DIAGNOSTIC; Surgeon: Serita Kyle, MD; Location: [facility]; Service: Gynecology; Laterality: N/A; . * : ROBOT ASSISTED MYOMECTOMY * : N/A * : 02/09/2015 * : Procedure: MYOMECTOMY,EXCISION OF ENDOMETRIOSIS,PRE-SACRAL  NEURECTOMY; Surgeon: Fermin Schwab, MD; Location: [facility]; Service: Gynecology; Laterality: N/A; . * : ROBOTIC ASSISTED LAPAROSCOPIC LYSIS OF ADHESION * : N/A * : 06/09/2013 * : Procedure: ROBOTIC ASSISTED  LAPAROSCOPIC LYSIS OF ADHESION; Resection of Endometriosis and excision of fibroid; Surgeon: Serita Kyle, MD; Location: [facility]; Service: Gynecology; Laterality: N/A; . * : TONSILLECTOMY * : * : . * : TYMPANOSTOMY TUBE PLACEMENT * : * : Home Medications Prior to Admission medications Medication * : Sig * : Start Date * : End Date * : Taking? * : Authorizing Provider acetaminophen (TYLENOL) 325 MG tablet * : Take 2 tablets (650 mg total) by mouth every 4 (four) hours as needed (for pain scale < 4). * : 04/17/18 * : * : * [place] Bing, MD amphetamine-dextroamphetamine (ADDERALL) 30 MG tablet * : Take 30 mg by mouth daily. * : * : * : * : [provider] cetirizine (ZYRTEC ALLERGY) 10 MG tablet * : Take 1 tablet (10 mg total) by mouth 2 (two) times daily. * : 08/11/22 * : 02/07/23 * : * : Theadora Rama Scales, PA-C fexofenadine (ALLEGRA) 180 MG tablet * : Take 1 tablet (180 mg total) by mouth daily. * : 08/11/22 * : 02/07/23 * : * : Theadora Rama Scales, PA-C ibuprofen (ADVIL) 400 MG tablet * : Take 1 tablet (400 mg total) by mouth every 8 (eight) hours as needed for up to 30 doses. * : 08/11/22 * : * : * : Theadora Rama Scales, PA-C naproxen (NAPROSYN) 500 MG tablet * : Take 1 tablet (500 mg total) by mouth 2 (two) times daily. * : 08/29/22 * : * : * : Theadora Rama Scales, PA-C Allergies           Patient has no known allergies. Family History Family History Problem * : Relation * : Age of Onset . * : Heart disease * : Mother * : 71 * :     CHF . * : Crohn's disease * : Mother * : . * : Asthma * : Mother * : . * : Asthma * : Brother * : . * : Heart disease * : Maternal Aunt * : . * : Diabetes * : Maternal Aunt * : . * : Stroke * : Maternal Aunt * : . * : Heart disease * : Maternal Grandfather * : . * : Stroke * : Maternal Grandfather * : Social History Tobacco Use.Smoking status:Never.Smokeless tobacco:NeverVaping Use.Vaping UJW:JXBJY usedSubstance Use Topics.Alcohol use:No.Drug use:No  REVIEW OF SYSTEMS: Negative unless as mentioned above. PHYSICAL EXAMINATION: Alert, awake, oriented to time, place, space. Speech and comprehension intact. Normal eye contact. Grossly no abnormal lateralizing neurological features evident. Station and gait normal. ASSESSMENT AND PLAN: The patient's CT angiogram of the head and neck depicting the high-grade right ICA stenosis was shared with her. Brought to her attention was the intracranial nature of the high-grade stenosis. In order to more accurately evaluate the high-grade stenosis, a formal diagnostic catheter arteriogram via the trans radial or transfemoral route was discussed with the patient. The procedure, would be under conscious sedation. Following the procedure the results will be shared with patient with a further management plan depending on the diagnostic study. A small risk of ischemic stroke one in a thousand was reviewed with patient. Patient expressed understanding and agreement with the above management plan. Diagnostic arteriogram will be scheduled as soon as possible. Patient advised to maintain adequate hydration. Should she  develop stroke-like symptoms, patient advised to call [phone]. Electronically Signed   By: Julieanne Cotton M.D.   On: 09/30/2023 08:11   DG Femur 1V Left Result Date: 09/28/2023 CLINICAL DATA:  Pain EXAM: LEFT FEMUR 1 VIEW COMPARISON:  Same day pelvic radiographs. FINDINGS: There is no evidence of fracture or other focal bone lesions. Soft tissues are unremarkable. IMPRESSION: Negative. Electronically Signed   By: Romona Curls M.D.   On: 09/28/2023 18:47   DG Pelvis 1-2 Views Result Date: 09/28/2023 CLINICAL DATA:  Pain EXAM: PELVIS - 1-2 VIEW COMPARISON:  Same day femur radiographs. FINDINGS: There is no evidence of pelvic fracture or diastasis. No pelvic bone lesions are seen. IMPRESSION: Negative. Electronically Signed   By: Romona Curls M.D.   On: 09/28/2023 18:46   MR LUMBAR SPINE WO CONTRAST Result Date:  09/28/2023 CLINICAL DATA:  Low back pain, no red flags, no prior management Left lower extremity with severe pain and possible weakness EXAM: MRI LUMBAR SPINE WITHOUT CONTRAST TECHNIQUE: Multiplanar, multisequence MR imaging of the lumbar spine was performed. No intravenous contrast was administered. COMPARISON:  None Available. FINDINGS: Segmentation:  Standard. Alignment:  Physiologic. Vertebrae:  No fracture, evidence of discitis, or bone lesion. Conus medullaris and cauda equina: Conus extends to the L2 level. Conus and cauda equina appear normal. Paraspinal and other soft tissues: Small bilateral T2 hyperintense renal lesions are too small to accurately characterize, but likely represent simple renal cysts requiring no further imaging workup. Disc levels: T12-L1: Unremarkable L1-L2: Unremarkable L2-L3: Mild bilateral facet degenerative change. Minimal disc bulge. No significant spinal canal narrowing. No neural foraminal narrowing. L3-L4: Unremarkable L4-L5: Mild bilateral facet degenerative change, left-greater-than-right. Minimal disc bulge. No spinal canal narrowing. No neural foraminal narrowing. L5-S1: Circumferential disc bulge. No significant spinal canal narrowing. There is mild narrowing of the left lateral recess. Mild right and moderate left neural foraminal narrowing. IMPRESSION: 1. No acute abnormality. 2. Mild narrowing of the left lateral recess at L5-S1 with moderate left neuroforaminal narrowing. 3. No significant spinal canal narrowing. Electronically Signed   By: Lorenza Cambridge M.D.   On: 09/28/2023 15:23   US Venous Img Lower  Left (DVT Study) Result Date: 09/28/2023 CLINICAL DATA:  Left thigh pain for 1 day. EXAM: LEFT LOWER EXTREMITY VENOUS DOPPLER ULTRASOUND TECHNIQUE: Gray-scale sonography with compression, as well as color and duplex ultrasound, were performed to evaluate the deep venous system(s) from the level of the common femoral vein through the popliteal and proximal calf veins.  COMPARISON:  None Available. FINDINGS: VENOUS Normal compressibility of the common femoral, superficial femoral, and popliteal veins, as well as the visualized calf veins. Visualized portions of profunda femoral vein and great saphenous vein unremarkable. No filling defects to suggest DVT on grayscale or color Doppler imaging. Doppler waveforms show normal direction of venous flow, normal respiratory plasticity and response to augmentation. Limited views of the contralateral common femoral vein are unremarkable. OTHER None. Limitations: none IMPRESSION: Negative. Electronically Signed   By: Romona Curls M.D.   On: 09/28/2023 09:40    Treatments: Observation; supportive care  Discharge Exam: Blood pressure 132/74, pulse 86, temperature 99.3 F (37.4 C), temperature source Axillary, resp. rate 17, height 5\' 8"  (1.727 m), weight 185 lb (83.9 kg), last menstrual period 10/02/2023, SpO2 100%.  Physical Exam Constitutional:      General: She is not in acute distress.    Appearance: She is not ill-appearing.  HENT:     Mouth/Throat:     Mouth: Mucous  membranes are moist.     Pharynx: Oropharynx is clear.  Cardiovascular:     Rate and Rhythm: Normal rate.     Pulses: Normal pulses.  Pulmonary:     Effort: Pulmonary effort is normal.  Abdominal:     Tenderness: There is no abdominal tenderness.  Musculoskeletal:     Right lower leg: No edema.     Left lower leg: No edema.  Skin:    General: Skin is warm and dry.  Neurological:     Mental Status: She is alert and oriented to person, place, and time.  Psychiatric:        Mood and Affect: Mood normal.        Behavior: Behavior normal.        Thought Content: Thought content normal.        Judgment: Judgment normal.     Disposition: Discharge disposition: 01-Home or Self Care    Allergies as of 10/17/2023       Reactions   Chlorhexidine Itching   Patient also had mild redness and hives in one location.  Recommended to pre medicate  with Benadryl if needed.        Medication List     TAKE these medications    acetaminophen 325 MG tablet Commonly known as: Tylenol Take 2 tablets (650 mg total) by mouth every 4 (four) hours as needed (for pain scale < 4).   ALPRAZolam 0.5 MG tablet Commonly known as: XANAX Take 0.5 mg by mouth at bedtime.   amLODipine 5 MG tablet Commonly known as: NORVASC Take 1 tablet (5 mg total) by mouth daily.   amphetamine-dextroamphetamine 30 MG tablet Commonly known as: ADDERALL Take 30 mg by mouth daily.   aspirin 81 MG chewable tablet Chew 1 tablet (81 mg total) by mouth daily.   atorvastatin 20 MG tablet Commonly known as: Lipitor Take 1 tablet (20 mg total) by mouth daily.   cetirizine 10 MG tablet Commonly known as: ZyrTEC Allergy Take 1 tablet (10 mg total) by mouth daily.   clopidogrel 75 MG tablet Commonly known as: Plavix Take 1 tablet (75 mg total) by mouth daily.   ELDERBERRY PO Take 2 tablets by mouth daily at 2 am. Gummy   gabapentin 100 MG capsule Commonly known as: Neurontin Take 1 capsule (100 mg total) by mouth 3 (three) times daily.   lidocaine 5 % Commonly known as: Lidoderm Place 1 patch onto the skin daily. Remove & Discard patch within 12 hours or as directed by MD   ondansetron 4 MG disintegrating tablet Commonly known as: ZOFRAN-ODT Take 1 tablet (4 mg total) by mouth every 8 (eight) hours as needed for nausea or vomiting.   predniSONE 10 MG tablet Commonly known as: DELTASONE Take 5 tablets (50 mg total) by mouth daily for 3 days, THEN 4 tablets (40 mg total) daily for 3 days, THEN 3 tablets (30 mg total) daily for 3 days, THEN 2 tablets (20 mg total) daily for 3 days, THEN 1 tablet (10 mg total) daily for 3 days, THEN 0.5 tablets (5 mg total) daily for 3 days. Start taking on: October 17, 2023 What changed: See the new instructions.   Saline 0.65 % Soln Place 1 spray into the nose daily.          Electronically Signed: Mickie Kay, NP 10/17/2023, 10:11 AM   I have spent Greater Than 30 Minutes discharging Oval [place].

## 2023-10-17 NOTE — TOC Initial Note (Signed)
Transition of Care [facility]) - Initial/Assessment Note    Patient Details  Name: Diane [name] MRN: [id number] Date of Birth: 11-Jun-1981  Transition of Care [facility]) CM/SW Contact:    Glennon Mac, RN Phone Number: 10/17/2023, 3:41 PM  Clinical Narrative:                 43 yo female s/p R common carotid arteriogarm with R CFA approach, balloon angioplasty and stent assisted angioplasty. complicated by post-op hematoma.  PTA, pt independent and living at home with her children; her mother plans to stay with her at discharge to provide needed assistance.  PT recommending HH and RW for home. Unable to schedule [facility] PT with patient's insurance; will make referral to [facility] for follow up PT.  Referral to [facility] for RW, to be delivered to bedside prior to dc.    Expected Discharge Plan: OP Rehab Barriers to Discharge: Continued Medical Work up            Expected Discharge Plan and Services   Discharge Planning Services: CM Consult     Expected Discharge Date: 10/17/23               DME Arranged: Dan Humphreys rolling DME Agency: [employer] Date DME Agency Contacted: 10/17/23 Time DME Agency Contacted: [phone] Representative spoke with at DME Agency: Mickeal Needy            Prior Living Arrangements/Services   Lives with:: Minor Children, Parents Patient language and need for interpreter reviewed:: Yes Do you feel safe going back to the place where you live?: Yes      Need for Family Participation in Patient Care: Yes (Comment) Care giver support system in place?: Yes (comment)   Criminal Activity/Legal Involvement Pertinent to Current Situation/Hospitalization: No - Comment as needed  Activities of Daily Living   ADL Screening (condition at time of admission) Independently performs ADLs?: Yes (appropriate for developmental age) Is the patient deaf or have difficulty hearing?: No Does the patient have difficulty seeing, even when wearing  glasses/contacts?: No Does the patient have difficulty concentrating, remembering, or making decisions?: No                 Emotional Assessment   Attitude/Demeanor/Rapport: Engaged Affect (typically observed): Accepting Orientation: : Oriented to Self, Oriented to Place, Oriented to  Time, Oriented to Situation      Admission diagnosis:  Stenosis of intracranial portions of right internal carotid artery [I65.21] Patient Active Problem List   Diagnosis Date Noted   Stenosis of intracranial portions of right internal carotid artery 10/16/2023   Screening for lipid disorders 10/14/2023   High blood pressure 10/14/2023   Allergic rhinitis 10/14/2023   Stenosis of right carotid artery 10/14/2023   Left leg pain 09/28/2023   Cervical dysplasia 11/30/2020   Dysmenorrhea 05/19/2019   H/O myomectomy 01/21/2018   Low back pain radiating to left leg 10/04/2015   Intractable migraine without aura 06/19/2013   Vertigo 06/13/2013   Headache 06/13/2013   History of endometriosis 06/13/2013   CROHN'S DISEASE-LARGE & SMALL INTESTINE 07/27/2009   GASTROESOPHAGEAL REFLUX DISEASE, MILD 07/12/2008   PCP:  Donell Beers, FNP Pharmacy:   [id number] - [place], [place] - [address] AT [facility] [address] [place] [address] Phone: [phone] Fax: [phone]     Social Drivers of Health (SDOH) Social History: SDOH Screenings   Depression (PHQ2-9): Low Risk  (10/14/2023)  Tobacco  Use: Low Risk  (10/16/2023)   SDOH Interventions:     Readmission Risk Interventions     No data to display         Quintella Baton, RN, BSN  Trauma/Neuro ICU Case Manager [phone]

## 2023-10-17 NOTE — Plan of Care (Signed)
  Problem: Education: Goal: Knowledge of General Education information will improve Description: Including pain rating scale, medication(s)/side effects and non-pharmacologic comfort measures Outcome: Progressing   Problem: Clinical Measurements: Goal: Ability to maintain clinical measurements within normal limits will improve Outcome: Progressing Goal: Will remain free from infection Outcome: Progressing Goal: Diagnostic test results will improve Outcome: Progressing Goal: Cardiovascular complication will be avoided Outcome: Progressing   Problem: Activity: Goal: Risk for activity intolerance will decrease Outcome: Progressing   Problem: Nutrition: Goal: Adequate nutrition will be maintained Outcome: Progressing   Problem: Pain Managment: Goal: General experience of comfort will improve and/or be controlled Outcome: Progressing   Problem: Education: Goal: Understanding of CV disease, CV risk reduction, and recovery process will improve Outcome: Progressing   Problem: Activity: Goal: Ability to return to baseline activity level will improve Outcome: Progressing   Problem: Cardiovascular: Goal: Ability to achieve and maintain adequate cardiovascular perfusion will improve Outcome: Progressing Goal: Vascular access site(s) Level 0-1 will be maintained Outcome: Progressing

## 2023-10-17 NOTE — Plan of Care (Signed)

## 2023-10-17 NOTE — Evaluation (Signed)
Physical Therapy Evaluation Patient Details Name: Diane [name] MRN: [id number] DOB: July 26, 1981 Today's Date: 10/17/2023  History of Present Illness  43 yo female s/p R common carotid arteriogarm with R CFA approach, balloon angioplasty and stent assisted angioplasty. complicated by post-op hematoma. PMH includes ADD, anxiety, crohn's disease, PNA, history of vasovagal syncope.  Clinical Impression   Pt presents with min R groin pain, generalized weakness, impaired balance requiring AD for gait at this time, and lightheadedness with mobility (SBP drop ~15 with mobility, recovered with return to supine). Pt to benefit from acute PT to address deficits. Pt ambulated short hallway distance before feeling too weak and lightheaded to continue, SBP  drop from 140s to 126. Unable to practice stair navigation this date given dizziness with mobility. PT to progress mobility as tolerated, and will continue to follow acutely.          If plan is discharge home, recommend the following: A little help with walking and/or transfers;A little help with bathing/dressing/bathroom   Can travel by private vehicle        Equipment Recommendations Rolling walker (2 wheels);Other (comment) (tub bench?)  Recommendations for Other Services       Functional Status Assessment Patient has had a recent decline in their functional status and demonstrates the ability to make significant improvements in function in a reasonable and predictable amount of time.     Precautions / Restrictions Precautions Precautions: Fall Precaution Comments: groin site dressed with pressure dressing, KI d/c at time of PT eval. PT reviewed safe mobility with R groin hematoma Restrictions Weight Bearing Restrictions Per Provider Order: No      Mobility  Bed Mobility Overal bed mobility: Needs Assistance Bed Mobility: Supine to Sit, Sit to Supine     Supine to sit: Min assist Sit to supine: Min assist   General bed mobility  comments: assist for RLE to/from EOB    Transfers Overall transfer level: Needs assistance Equipment used: 1 person hand held assist Transfers: Sit to/from Stand Sit to Stand: Min assist           General transfer comment: light rise and steady assist    Ambulation/Gait Ambulation/Gait assistance: Min assist Gait Distance (Feet): 45 Feet Assistive device: Rolling walker (2 wheels) Gait Pattern/deviations: Step-through pattern, Decreased stride length, Trunk flexed Gait velocity: decr     General Gait Details: assist to steady and manage RW, pt requiring UE support given imbalance standing statically EOB. Cues for upright posture, RW use. Pt reporting lightheadedness and feeling weak, SBP drop from 140s to 126, recovered to 134 with return to supine.  Stairs            Wheelchair Mobility     Tilt Bed    Modified Rankin (Stroke Patients Only)       Balance Overall balance assessment: Mild deficits observed, not formally tested                                           Pertinent Vitals/Pain Pain Assessment Pain Assessment: Faces Faces Pain Scale: Hurts a little bit Pain Location: R groin Pain Descriptors / Indicators: Discomfort, Grimacing, Guarding Pain Intervention(s): Limited activity within patient's tolerance, Monitored during session, Repositioned    Home Living Family/patient expects to be discharged to:: Private residence Living Arrangements: Children Available Help at Discharge: Family (mom) Type of Home: Apartment Home Access: Stairs to  enter Entrance Stairs-Rails: Right;Left Entrance Stairs-Number of Steps: flight   Home Layout: One level Home Equipment: None      Prior Function Prior Level of Function : Independent/Modified Independent                     Extremity/Trunk Assessment   Upper Extremity Assessment Upper Extremity Assessment: Defer to OT evaluation    Lower Extremity Assessment Lower  Extremity Assessment: Generalized weakness    Cervical / Trunk Assessment Cervical / Trunk Assessment: Normal  Communication      Cognition Arousal: Alert Behavior During Therapy: WFL for tasks assessed/performed Overall Cognitive Status: Within Functional Limits for tasks assessed                                          General Comments General comments (skin integrity, edema, etc.): art line L wrist (not calibrated during session), R groin site looks [facility] pre and post mobility    Exercises     Assessment/Plan    PT Assessment Patient needs continued PT services  PT Problem List Decreased mobility;Decreased strength;Decreased activity tolerance;Decreased balance;Decreased knowledge of use of DME;Pain;Cardiopulmonary status limiting activity;Decreased knowledge of precautions;Decreased safety awareness       PT Treatment Interventions DME instruction;Therapeutic activities;Gait training;Therapeutic exercise;Patient/family education;Balance training;Stair training;Functional mobility training;Neuromuscular re-education    PT Goals (Current goals can be found in the Care Plan section)  Acute Rehab PT Goals Patient Stated Goal: home PT Goal Formulation: With patient Time For Goal Achievement: 10/31/23 Potential to Achieve Goals: Good    Frequency Min 1X/week     Co-evaluation               AM-PAC PT "6 Clicks" Mobility  Outcome Measure Help needed turning from your back to your side while in a flat bed without using bedrails?: A Little Help needed moving from lying on your back to sitting on the side of a flat bed without using bedrails?: A Little Help needed moving to and from a bed to a chair (including a wheelchair)?: A Little Help needed standing up from a chair using your arms (e.g., wheelchair or bedside chair)?: A Little Help needed to walk in hospital room?: A Little Help needed climbing 3-5 steps with a railing? : A Little 6 Click Score:  18    End of Session   Activity Tolerance: Patient tolerated treatment well Patient left: with call [place]/phone within reach;in bed;with bed alarm set;with family/visitor present Nurse Communication: Mobility status PT Visit Diagnosis: Other abnormalities of gait and mobility (R26.89);Muscle weakness (generalized) (M62.81)    Time: 0630-1601 PT Time Calculation (min) (ACUTE ONLY): 34 min   Charges:   PT Evaluation $PT Eval Low Complexity: 1 Low PT Treatments $Therapeutic Activity: 8-22 mins PT General Charges $$ ACUTE PT VISIT: 1 Visit         Marye Round, PT DPT Acute Rehabilitation Services Secure Chat Preferred  Office [phone]   Marlyne Totaro E Stroup 10/17/2023, 11:00 AM

## 2023-10-17 NOTE — Procedures (Signed)
INR  Status post L4 balloon kyphoplasty.  Bi pedicular approach.  No acute complications.    Patient tolerated the procedure well.    Fatima Sanger MD.

## 2023-10-18 LAB — BASIC METABOLIC PANEL
Anion gap: 9 (ref 5–15)
BUN: 9 mg/dL (ref 6–20)
CO2: 24 mmol/L (ref 22–32)
Calcium: 8.2 mg/dL — ABNORMAL LOW (ref 8.9–10.3)
Chloride: 108 mmol/L (ref 98–111)
Creatinine, Ser: 0.71 mg/dL (ref 0.44–1.00)
GFR, Estimated: >60 mL/min (ref >60)
Glucose, Bld: 108 mg/dL — ABNORMAL HIGH (ref 70–99)
Potassium: 2.9 mmol/L — ABNORMAL LOW (ref 3.5–5.1)
Sodium: 141 mmol/L (ref 135–145)

## 2023-10-18 NOTE — Discharge Instructions (Addendum)
Femoral Site Care This sheet gives you information about how to care for yourself after your procedure. Your health care provider may also give you more specific instructions. If you have problems or questions, contact your health care provider. What can I expect after the procedure? After the procedure, it is common to have: Bruising that usually fades within 1-2 weeks. Tenderness at the site. Follow these instructions at home: Wound care Follow instructions from your health care provider about how to take care of your insertion site. Make sure you: Wash your hands with soap and water before you change your bandage (dressing). If soap and water are not available, use hand sanitizer. Change your dressing as directed- pressure dressing removed 24 hours post-procedure (and switch for bandaid), bandaid removed 72 hours post-procedure Do not take baths, swim, or use a hot tub for 7 days post-procedure. You may shower 48 hours after the procedure or as told by your health care provider. Gently wash the site with plain soap and water. Pat the area dry with a clean towel. Do not rub the site. This may cause bleeding. Check your site every day for signs of infection. Check for: Redness, swelling, or pain. Fluid or blood. Warmth. Pus or a bad smell. Activity Do not stoop, bend, or lift anything that is heavier than 10 lb (4.5 kg) for 2 weeks post-procedure. Do not drive self for 2 weeks post-procedure. Contact a health care provider if you have: A fever or chills. You have redness, swelling, or pain around your insertion site. Get help right away if: The catheter insertion area swells very fast. You pass out. You suddenly start to sweat or your skin gets clammy. The catheter insertion area is bleeding, and the bleeding does not stop when you hold steady pressure on the area. The area near or just beyond the catheter insertion site becomes pale, cool, tingly, or numb. These symptoms may  represent a serious problem that is an emergency. Do not wait to see if the symptoms will go away. Get medical help right away. Call your local emergency services (911 in the [place] Do not drive yourself to the hospital.  This information is not intended to replace advice given to you by your health care provider. Make sure you discuss any questions you have with your health care provider. Document Revised: 09/23/2017 Document Reviewed: 09/23/2017 Elsevier Patient Education  2020 [employer].

## 2023-10-18 NOTE — Discharge Planning (Addendum)
Patient seen with Dr. Corliss Skains this morning - no concerns overnight. Ate all of her breakfast, going to work with PT shortly. Mother at bedside.   Per RN bruising at groin site stable, no bleeding or swelling. BMP pending - mild hypokalemia on pre-procedure BMP, will recheck prior to d/c.  Discussed femoral site care, follow up and medications with patient and mother who state understanding.  Plan: - Discharge home today after seen by PT and BMP resulted - Follow up with Dr. Corliss Skains in 2 weeks - No bending, stooping, lifting > 10 lbs, driving until seen by Dr. Corliss Skains in follow up. May ride in car immediately. - Do not submerge femoral site x 7 days, may shower immediately - Continue Plavix 75 mg every day + ASA 324 mg every day - Continue all other medications prescribed by other providers - Follow up with PCP regarding HTN/HLD  Patient aware to call with questions or concerns, contact info in AVS.  ADDENDUM 1012 - K+ 2.9 today, patient asymptomatic. Recommend follow up with PCP within 1 week for repeat labs.  Lynnette Caffey, PA-C

## 2023-10-18 NOTE — Progress Notes (Addendum)
Physical Therapy Treatment Patient Details Name: Diane [name] MRN: [id number] DOB: [birth year] Today's Date: 10/18/2023   History of Present Illness 43 yo female s/p R common carotid arteriogarm with R CFA approach, balloon angioplasty and stent assisted angioplasty. complicated by post-op hematoma. PMH includes ADD, anxiety, crohn's disease, PNA, history of vasovagal syncope.    PT Comments  Pt reports some dizziness which she describes as feeling weak and head-pounding, VSS throughout (see below). Pt with good mobility progression, ambulating 100+ ft in hallway. Pt benefits from RW at this time for balance and offweighting RLE given pain. Pt proficiently navigated steps, max cues for sequencing and holding groin site. Pt appropraite to d/c home today from a PT perspective.   BP, HR:  - supine: 126/76, 81 - sitting: 142/83, 98 - standing: 131/81, 102 - stand post-stairs: 132/84, 106 (+dizzy) - return to supine: 149/83, 78 (+dizzy)     If plan is discharge home, recommend the following: A little help with walking and/or transfers;A little help with bathing/dressing/bathroom   Can travel by private vehicle        Equipment Recommendations  Rolling walker (2 wheels);BSC/3in1    Recommendations for Other Services       Precautions / Restrictions Precautions Precautions: Fall Precaution Comments: PT reviewed safe mobility with R groin hematoma (how to place RLE with sitting<>standing to avoid pressure on groin, holding pressure over site during sit<>stands and stair navigation, assist with RLE in/out of bed with PT support and with hooking LLE under RLE for support and avoiding strain) Restrictions Weight Bearing Restrictions Per Provider Order: No     Mobility  Bed Mobility Overal bed mobility: Needs Assistance Bed Mobility: Supine to Sit, Sit to Supine     Supine to sit: Min assist Sit to supine: Min assist   General bed mobility comments: assist for RLE lifting to avoid  straining    Transfers Overall transfer level: Needs assistance Equipment used: Rolling walker (2 wheels) Transfers: Sit to/from Stand Sit to Stand: Min assist           General transfer comment: light rise and steady assist    Ambulation/Gait Ambulation/Gait assistance: Contact guard assist Gait Distance (Feet): 160 Feet Assistive device: Rolling walker (2 wheels) Gait Pattern/deviations: Step-through pattern, Decreased stride length, Trunk flexed Gait velocity: decr     General Gait Details: close guard for safety, cues for RW use and placement in RW. short distance gait without AD, but pt reporting dizziness requesting support of RW   Stairs Stairs: Yes Stairs assistance: Contact guard assist Stair Management: One rail Left, Step to pattern, Forwards Number of Stairs: 7 General stair comments: cues for up with the LLE leading, down with the RLE leading. Pt holding pressure on groin site during stair navigation. Trialled x2 steps without railing and HHA only, pt tolerated well with increased time   Wheelchair Mobility     Tilt Bed    Modified Rankin (Stroke Patients Only)       Balance Overall balance assessment: Mild deficits observed, not formally tested                                          Cognition Arousal: Alert Behavior During Therapy: WFL for tasks assessed/performed Overall Cognitive Status: Within Functional Limits for tasks assessed  Exercises  Home walking program: up and walking 1x/hour during waking hours for short household distances (2-45 minutes at a time) with supervision of family, to promote circulation, activity tolerance, and strength maintenance.      General Comments General comments (skin integrity, edema, etc.): vss (see note)      Pertinent Vitals/Pain Pain Assessment Pain Assessment: Faces Faces Pain Scale: Hurts little more Pain Location: R  groin Pain Descriptors / Indicators: Discomfort, Grimacing, Guarding Pain Intervention(s): Limited activity within patient's tolerance, Monitored during session, Repositioned    Home Living                          Prior Function            PT Goals (current goals can now be found in the care plan section) Acute Rehab PT Goals Patient Stated Goal: home PT Goal Formulation: With patient Time For Goal Achievement: 10/31/23 Potential to Achieve Goals: Good Progress towards PT goals: Progressing toward goals    Frequency    Min 1X/week      PT Plan      Co-evaluation              AM-PAC PT "6 Clicks" Mobility   Outcome Measure  Help needed turning from your back to your side while in a flat bed without using bedrails?: A Little Help needed moving from lying on your back to sitting on the side of a flat bed without using bedrails?: A Little Help needed moving to and from a bed to a chair (including a wheelchair)?: A Little Help needed standing up from a chair using your arms (e.g., wheelchair or bedside chair)?: A Little Help needed to walk in hospital room?: A Little Help needed climbing 3-5 steps with a railing? : A Little 6 Click Score: 18    End of Session   Activity Tolerance: Patient tolerated treatment well Patient left: with call [place]/phone within reach;in bed;with family/visitor present;with nursing/sitter in room Nurse Communication: Mobility status PT Visit Diagnosis: Other abnormalities of gait and mobility (R26.89);Muscle weakness (generalized) (M62.81)     Time: 1610-9604 PT Time Calculation (min) (ACUTE ONLY): 30 min  Charges:    $Gait Training: 8-22 mins $Therapeutic Activity: 8-22 mins PT General Charges $$ ACUTE PT VISIT: 1 Visit                     Marye Round, PT DPT Acute Rehabilitation Services Secure Chat Preferred  Office [phone]    Duran Ohern E Stroup 10/18/2023, 10:12 AM

## 2023-10-21 ENCOUNTER — Other Ambulatory Visit: Payer: Self-pay | Admitting: Nurse Practitioner

## 2023-10-21 DIAGNOSIS — I6521 Occlusion and stenosis of right carotid artery: Secondary | ICD-10-CM

## 2023-10-22 ENCOUNTER — Other Ambulatory Visit: Payer: Self-pay | Admitting: Nurse Practitioner

## 2023-10-22 DIAGNOSIS — R269 Unspecified abnormalities of gait and mobility: Secondary | ICD-10-CM

## 2023-10-23 ENCOUNTER — Telehealth (HOSPITAL_COMMUNITY): Payer: Self-pay

## 2023-10-23 NOTE — Telephone Encounter (Signed)
Returned pt's call, no answer, left vm. AB

## 2023-10-28 LAB — GENECONNECT MOLECULAR SCREEN: Genetic Analysis Overall Interpretation: NEGATIVE

## 2023-10-30 ENCOUNTER — Ambulatory Visit (HOSPITAL_COMMUNITY)
Admit: 2023-10-30 | Discharge: 2023-10-30 | Disposition: A | Discharge status: Home / Self Care | Payer: BC Managed Care – PPO | Attending: Student | Admitting: Student

## 2023-10-30 DIAGNOSIS — I6521 Occlusion and stenosis of right carotid artery: Secondary | ICD-10-CM

## 2023-10-31 HISTORY — PX: IR RADIOLOGIST EVAL & MGMT: IMG5224

## 2023-11-04 ENCOUNTER — Ambulatory Visit: Payer: Self-pay | Admitting: Nurse Practitioner

## 2023-11-04 ENCOUNTER — Emergency Department (HOSPITAL_COMMUNITY): Payer: BC Managed Care – PPO

## 2023-11-04 ENCOUNTER — Encounter (HOSPITAL_COMMUNITY): Payer: Self-pay | Admitting: Emergency Medicine

## 2023-11-04 ENCOUNTER — Observation Stay (HOSPITAL_COMMUNITY)
Admission: EM | Admit: 2023-11-04 | Discharge: 2023-11-07 | Disposition: A | Discharge status: Home / Self Care | Payer: BC Managed Care – PPO | Attending: Student | Admitting: Student

## 2023-11-04 ENCOUNTER — Other Ambulatory Visit: Payer: Self-pay

## 2023-11-04 DIAGNOSIS — I10 Essential (primary) hypertension: Secondary | ICD-10-CM | POA: Diagnosis not present

## 2023-11-04 DIAGNOSIS — I4581 Long QT syndrome: Secondary | ICD-10-CM | POA: Diagnosis not present

## 2023-11-04 DIAGNOSIS — Z7901 Long term (current) use of anticoagulants: Secondary | ICD-10-CM | POA: Diagnosis not present

## 2023-11-04 DIAGNOSIS — I6521 Occlusion and stenosis of right carotid artery: Secondary | ICD-10-CM | POA: Diagnosis not present

## 2023-11-04 DIAGNOSIS — R000 Tachycardia, unspecified: Secondary | ICD-10-CM | POA: Insufficient documentation

## 2023-11-04 DIAGNOSIS — Z1152 Encounter for screening for COVID-19: Secondary | ICD-10-CM | POA: Diagnosis not present

## 2023-11-04 DIAGNOSIS — R519 Headache, unspecified: Secondary | ICD-10-CM | POA: Diagnosis not present

## 2023-11-04 DIAGNOSIS — J101 Influenza due to other identified influenza virus with other respiratory manifestations: Principal | ICD-10-CM | POA: Insufficient documentation

## 2023-11-04 DIAGNOSIS — E785 Hyperlipidemia, unspecified: Secondary | ICD-10-CM | POA: Insufficient documentation

## 2023-11-04 DIAGNOSIS — Z79899 Other long term (current) drug therapy: Secondary | ICD-10-CM | POA: Diagnosis not present

## 2023-11-04 DIAGNOSIS — E876 Hypokalemia: Secondary | ICD-10-CM | POA: Insufficient documentation

## 2023-11-04 DIAGNOSIS — R0789 Other chest pain: Secondary | ICD-10-CM | POA: Diagnosis not present

## 2023-11-04 DIAGNOSIS — Z8669 Personal history of other diseases of the nervous system and sense organs: Secondary | ICD-10-CM | POA: Insufficient documentation

## 2023-11-04 DIAGNOSIS — R0602 Shortness of breath: Secondary | ICD-10-CM | POA: Diagnosis present

## 2023-11-04 DIAGNOSIS — F419 Anxiety disorder, unspecified: Secondary | ICD-10-CM | POA: Diagnosis not present

## 2023-11-04 LAB — CBC
HCT: 34.1 % — ABNORMAL LOW (ref 36.0–46.0)
Hemoglobin: 11.5 g/dL — ABNORMAL LOW (ref 12.0–15.0)
MCH: 29.3 pg (ref 26.0–34.0)
MCHC: 33.7 g/dL (ref 30.0–36.0)
MCV: 86.8 fL (ref 80.0–100.0)
Platelets: 241 10*3/uL (ref 150–400)
RBC: 3.93 MIL/uL (ref 3.87–5.11)
RDW: 13.4 % (ref 11.5–15.5)
WBC: 5.6 10*3/uL (ref 4.0–10.5)
nRBC: 0 % (ref 0.0–0.2)

## 2023-11-04 LAB — MAGNESIUM: Magnesium: 1.4 mg/dL — ABNORMAL LOW (ref 1.7–2.4)

## 2023-11-04 LAB — BASIC METABOLIC PANEL
Anion gap: 12 (ref 5–15)
BUN: 7 mg/dL (ref 6–20)
CO2: 23 mmol/L (ref 22–32)
Calcium: 8.7 mg/dL — ABNORMAL LOW (ref 8.9–10.3)
Chloride: 106 mmol/L (ref 98–111)
Creatinine, Ser: 0.75 mg/dL (ref 0.44–1.00)
GFR, Estimated: >60 mL/min (ref >60)
Glucose, Bld: 92 mg/dL (ref 70–99)
Potassium: 2.7 mmol/L — CL (ref 3.5–5.1)
Sodium: 141 mmol/L (ref 135–145)

## 2023-11-04 LAB — HCG, SERUM, QUALITATIVE: Preg, Serum: NEGATIVE

## 2023-11-04 LAB — TROPONIN I (HIGH SENSITIVITY)
Troponin I (High Sensitivity): 5 ng/L (ref <18)
Troponin I (High Sensitivity): 5 ng/L

## 2023-11-04 LAB — RESP PANEL BY RT-PCR (RSV, FLU A&B, COVID)  RVPGX2
Influenza A by PCR: POSITIVE — AB
Influenza B by PCR: NEGATIVE
Resp Syncytial Virus by PCR: NEGATIVE
SARS Coronavirus 2 by RT PCR: NEGATIVE

## 2023-11-04 MED ORDER — SODIUM CHLORIDE 0.9 % IV BOLUS
500.0000 mL | Freq: Once | INTRAVENOUS | Status: AC
  Administered 2023-11-04: 500 mL via INTRAVENOUS

## 2023-11-04 MED ORDER — PROCHLORPERAZINE EDISYLATE 10 MG/2ML IJ SOLN
10.0000 mg | Freq: Once | INTRAMUSCULAR | Status: AC
  Administered 2023-11-04: 10 mg via INTRAVENOUS
  Filled 2023-11-04: qty 2

## 2023-11-04 MED ORDER — IOHEXOL 350 MG/ML SOLN
75.0000 mL | Freq: Once | INTRAVENOUS | Status: AC | PRN
  Administered 2023-11-04: 75 mL via INTRAVENOUS

## 2023-11-04 MED ORDER — ACETAMINOPHEN 325 MG PO TABS
650.0000 mg | ORAL_TABLET | Freq: Four times a day (QID) | ORAL | Status: DC | PRN
  Administered 2023-11-05 – 2023-11-07 (×4): 650 mg via ORAL
  Filled 2023-11-04 (×5): qty 2

## 2023-11-04 MED ORDER — ENOXAPARIN SODIUM 40 MG/0.4ML IJ SOSY
40.0000 mg | PREFILLED_SYRINGE | Freq: Every day | INTRAMUSCULAR | Status: DC
  Administered 2023-11-05 – 2023-11-07 (×3): 40 mg via SUBCUTANEOUS
  Filled 2023-11-04 (×3): qty 0.4

## 2023-11-04 MED ORDER — ACETAMINOPHEN 650 MG RE SUPP: 650.0000 mg | Freq: Four times a day (QID) | RECTAL | Status: DC | PRN

## 2023-11-04 MED ORDER — MAGNESIUM SULFATE 2 GM/50ML IV SOLN
2.0000 g | Freq: Once | INTRAVENOUS | Status: AC
  Administered 2023-11-04: 2 g via INTRAVENOUS
  Filled 2023-11-04: qty 50

## 2023-11-04 MED ORDER — SODIUM CHLORIDE 0.9 % IV BOLUS
1000.0000 mL | Freq: Once | INTRAVENOUS | Status: AC
Start: 2023-11-04 — End: 2023-11-04
  Administered 2023-11-04: 1000 mL via INTRAVENOUS

## 2023-11-04 MED ORDER — ONDANSETRON HCL 4 MG PO TABS: 4.0000 mg | ORAL_TABLET | Freq: Four times a day (QID) | ORAL | Status: DC | PRN

## 2023-11-04 MED ORDER — DIPHENHYDRAMINE HCL 50 MG/ML IJ SOLN
25.0000 mg | Freq: Once | INTRAMUSCULAR | Status: AC
  Administered 2023-11-04: 25 mg via INTRAVENOUS
  Filled 2023-11-04: qty 1

## 2023-11-04 MED ORDER — ONDANSETRON HCL 4 MG/2ML IJ SOLN: 4.0000 mg | Freq: Four times a day (QID) | INTRAMUSCULAR | Status: DC | PRN

## 2023-11-04 MED ORDER — SENNOSIDES-DOCUSATE SODIUM 8.6-50 MG PO TABS: 1.0000 | ORAL_TABLET | Freq: Every evening | ORAL | Status: DC | PRN

## 2023-11-04 MED ORDER — MORPHINE SULFATE (PF) 4 MG/ML IV SOLN
4.0000 mg | Freq: Once | INTRAVENOUS | Status: AC
  Administered 2023-11-04: 4 mg via INTRAVENOUS
  Filled 2023-11-04: qty 1

## 2023-11-04 MED ORDER — POTASSIUM CHLORIDE CRYS ER 20 MEQ PO TBCR
40.0000 meq | EXTENDED_RELEASE_TABLET | Freq: Once | ORAL | Status: AC
  Administered 2023-11-04: 40 meq via ORAL
  Filled 2023-11-04: qty 2

## 2023-11-04 MED ORDER — POTASSIUM CHLORIDE 10 MEQ/100ML IV SOLN
10.0000 meq | INTRAVENOUS | Status: AC
  Administered 2023-11-04 (×2): 10 meq via INTRAVENOUS
  Filled 2023-11-04 (×2): qty 100

## 2023-11-04 MED ORDER — ENOXAPARIN SODIUM 40 MG/0.4ML IJ SOSY: 40.0000 mg | PREFILLED_SYRINGE | INTRAMUSCULAR | Status: DC

## 2023-11-04 MED ORDER — SODIUM CHLORIDE 0.9 % IV BOLUS
1000.0000 mL | Freq: Once | INTRAVENOUS | Status: AC
  Administered 2023-11-04: 1000 mL via INTRAVENOUS

## 2023-11-04 NOTE — H&P (Signed)
History and Physical  SHIRLA HODGKISS UJW:[id number] DOB: [birth year] DOA: 11/04/2023  PCP: Donell Beers, FNP   Chief Complaint: Shortness of breath, chest pain, headache  HPI: Diane [name] is a 43 y.o. female with medical history significant for anxiety, HTN, migraines, hypokalemia, ADD, HLD and right internal carotid artery stenosis s/p stent placement about 3 weeks ago who presents to the ED for evaluation of shortness of breath, chest pain and headache.  Patient reports that she had a left-sided headache on Saturday described as aching, dull and occasionally sharp.  Today, she started having some shortness of breath throughout the day with associated chest pain.  Chest pain is on her right chest and described as a pulled muscle.  She denies any fevers, chills, cough, nausea, vomiting, abdominal pain, vision changes, weakness or numbness.  ED Course: Vitals show tachycardia with SBP in the 120s to 130s but otherwise stable.  Labs are significant for hypokalemia with K+ of 2.7, stable anemia with Hgb of 11.5, negative troponins x 2, positive influenza A PCR.  EKG shows sinus tach with rate of 130 and prolonged QTc of 523.  Repeat CTA head and neck showed a new 3 mm filling defect in the right ICA.  Neurology and neurointerventional radiologist were both consulted and they recommended continuing medical management.  CTA chest PE study was negative for PE but showed mild pleural effusions and mild pericardial effusion.  Patient received IV mag 2 g, IV morphine 4 mg x 2, headache cocktail, IV NS 1.5 L bolus, IV and p.o. potassium. [facility] was consulted for admission  Review of Systems: Please see HPI for pertinent positives and negatives. A complete 10 system review of systems are otherwise negative.  Past Medical History:  Diagnosis Date   ADD (attention deficit disorder)    Anemia    Anxiety    Crohn's disease (HCC)    D-dimer, elevated    Fibroid    Headache(784.0)    otc meds - last  migraine 2009   Heart murmur    as child, never had any problems   History of endometriosis    Hypertension    Leiomyoma 02/09/2015   Low blood potassium    history   Migraine without aura, with intractable migraine, so stated, without mention of status migrainosus 06/19/2013   Pneumonia    x5   PONV (postoperative nausea and vomiting)    Seasonal allergies    Vaginal Pap smear, abnormal    Vasovagal syncope    Past Surgical History:  Procedure Laterality Date   ADENOIDECTOMY  2014   CESAREAN SECTION N/A 04/14/2018   Procedure: PRIMARY CESAREAN SECTION;  Surgeon: Tilda Burrow, MD;  Location: [facility];  Service: Obstetrics;  Laterality: N/A;   DILATATION & CURRETTAGE/HYSTEROSCOPY WITH RESECTOCOPE N/A 06/09/2013   Procedure: DILATATION & CURETTAGE/HYSTEROSCOPY WITH HYSTEROSCOPIC RESECTION OF SUBMUCOSAL FIBROID AND ENDOMETRIAL POLYP;  Surgeon: Serita Kyle, MD;  Location: [facility];  Service: Gynecology;  Laterality: N/A;   IR ANGIO INTRA EXTRACRAN SEL COM CAROTID INNOMINATE BILAT MOD SED  09/30/2023   IR ANGIO INTRA EXTRACRAN SEL INTERNAL CAROTID UNI R MOD SED  10/16/2023   IR ANGIO VERTEBRAL SEL SUBCLAVIAN INNOMINATE UNI L MOD SED  09/30/2023   IR ANGIO VERTEBRAL SEL VERTEBRAL UNI R MOD SED  09/30/2023   IR CT HEAD LTD  10/16/2023   IR INTRA CRAN STENT  10/16/2023   IR RADIOLOGIST EVAL & MGMT  09/30/2023   IR RADIOLOGIST EVAL &  MGMT  10/03/2023   IR RADIOLOGIST EVAL & MGMT  10/31/2023   IR US GUIDE VASC ACCESS RIGHT  09/30/2023   LAPAROSCOPY N/A 06/09/2013   Procedure: LAPAROSCOPY DIAGNOSTIC;  Surgeon: Serita Kyle, MD;  Location: [facility];  Service: Gynecology;  Laterality: N/A;   RADIOLOGY WITH ANESTHESIA N/A 10/16/2023   Procedure: endovascular treatment of the right internal carotid artery tandem intracranial stenosis;  Surgeon: Julieanne Cotton, MD;  Location: [facility];  Service: Radiology;  Laterality: N/A;   ROBOT ASSISTED MYOMECTOMY N/A 02/09/2015    Procedure: MYOMECTOMY,EXCISION OF ENDOMETRIOSIS,PRE-SACRAL NEURECTOMY;  Surgeon: Fermin Schwab, MD;  Location: [facility];  Service: Gynecology;  Laterality: N/A;   ROBOTIC ASSISTED LAPAROSCOPIC LYSIS OF ADHESION N/A 06/09/2013   Procedure: ROBOTIC ASSISTED LAPAROSCOPIC LYSIS OF ADHESION; Resection of Endometriosis and excision of fibroid;  Surgeon: Serita Kyle, MD;  Location: [facility];  Service: Gynecology;  Laterality: N/A;   TONSILLECTOMY  2014   TYMPANOSTOMY TUBE PLACEMENT  2014   Social History:  reports that she has never smoked. She has never used smokeless tobacco. She reports that she does not currently use alcohol. She reports that she does not use drugs.  Allergies  Allergen Reactions   Chlorhexidine Itching    Patient also had mild redness and hives in one location.  Recommended to pre medicate with Benadryl if needed.    Family History  Problem Relation Age of Onset   Heart disease Mother 44       CHF   Crohn's disease Mother    Asthma Mother    Asthma Brother    Heart disease Maternal Aunt    Diabetes Maternal Aunt    Stroke Maternal Aunt    Heart disease Maternal Grandfather    Stroke Maternal Grandfather      Prior to Admission medications   Medication Sig Start Date End Date Taking? Authorizing Provider  acetaminophen (TYLENOL) 325 MG tablet Take 2 tablets (650 mg total) by mouth every 4 (four) hours as needed (for pain scale < 4). Patient not taking: Reported on 10/15/2023 04/17/18   [place] Bing, MD  ALPRAZolam Prudy Feeler) 0.5 MG tablet Take 0.5 mg by mouth at bedtime. 10/10/23   [provider]  amLODipine (NORVASC) 5 MG tablet Take 1 tablet (5 mg total) by mouth daily. 10/14/23   Paseda, Baird Kay, FNP  amphetamine-dextroamphetamine (ADDERALL) 30 MG tablet Take 30 mg by mouth daily. Patient not taking: Reported on 10/14/2023    [provider]  aspirin 81 MG chewable tablet Chew 1 tablet (81 mg total) by mouth daily. 09/13/23   Rancour,  Jeannett Senior, MD  atorvastatin (LIPITOR) 20 MG tablet Take 1 tablet (20 mg total) by mouth daily. 10/15/23 10/14/24  Donell Beers, FNP  cetirizine (ZYRTEC ALLERGY) 10 MG tablet Take 1 tablet (10 mg total) by mouth daily. 10/14/23 04/11/24  Donell Beers, FNP  clopidogrel (PLAVIX) 75 MG tablet Take 1 tablet (75 mg total) by mouth daily. 10/03/23   Bruning, Caryn Bee, PA-C  ELDERBERRY PO Take 2 tablets by mouth daily at 2 am. Gummy    [provider]  gabapentin (NEURONTIN) 100 MG capsule Take 1 capsule (100 mg total) by mouth 3 (three) times daily. Patient not taking: Reported on 10/14/2023 09/28/23   Gareth Eagle, PA-C  lidocaine (LIDODERM) 5 % Place 1 patch onto the skin daily. Remove & Discard patch within 12 hours or as directed by MD Patient not taking: Reported on 10/14/2023 09/28/23   Glyn Ade, MD  ondansetron (ZOFRAN-ODT) 4 MG disintegrating tablet Take 1 tablet (4 mg total) by mouth every 8 (eight) hours as needed for nausea or vomiting. Patient not taking: Reported on 10/14/2023 09/15/23   Kommor, Wyn Forster, MD  Saline 0.65 % SOLN Place 1 spray into the nose daily.    [provider]    Physical Exam: BP (!) 153/92   Pulse (!) 113   Temp 98.7 F (37.1 C) (Oral)   Resp (!) 22   LMP 10/02/2023   SpO2 98%  General: Pleasant, well-appearing middle-age woman laying in bed. No acute distress. HEENT: [place]/AT. Anicteric sclera. Dry mucous membrane. CV: Tachycardia. Regular rhythm. No murmurs, rubs, or gallops. No LE edema Chest wall: Mild tenderness to palpation of the right chest. Pulmonary: Lungs CTAB. Normal effort. No wheezing or rales. Abdominal: Soft, nontender, nondistended. Normal bowel sounds. Extremities: Palpable radial and DP pulses. Normal ROM. Skin: Warm and dry. No obvious rash or lesions. Neuro: A&Ox3. Moves all extremities. Normal sensation to light touch. No focal deficit. Psych: Normal mood and affect          Labs on Admission:  Basic  Metabolic Panel: Recent Labs  Lab 11/04/23 1340 11/04/23 1525  NA 141  --   K 2.7*  --   CL 106  --   CO2 23  --   GLUCOSE 92  --   BUN 7  --   CREATININE 0.75  --   CALCIUM 8.7*  --   MG  --  1.4*   Liver Function Tests: No results for input(s): "AST", "ALT", "ALKPHOS", "BILITOT", "PROT", "ALBUMIN" in the last 168 hours. No results for input(s): "LIPASE", "AMYLASE" in the last 168 hours. No results for input(s): "AMMONIA" in the last 168 hours. CBC: Recent Labs  Lab 11/04/23 1340  WBC 5.6  HGB 11.5*  HCT 34.1*  MCV 86.8  PLT 241   Cardiac Enzymes: No results for input(s): "CKTOTAL", "CKMB", "CKMBINDEX", "TROPONINI" in the last 168 hours. BNP (last 3 results) No results for input(s): "BNP" in the last 8760 hours.  ProBNP (last 3 results) No results for input(s): "PROBNP" in the last 8760 hours.  CBG: No results for input(s): "GLUCAP" in the last 168 hours.  Radiological Exams on Admission: CT Angio Chest PE W and/or Wo Contrast Result Date: 11/04/2023 CLINICAL DATA:  Chest pain and shortness of breath. EXAM: CT ANGIOGRAPHY CHEST WITH CONTRAST TECHNIQUE: Multidetector CT imaging of the chest was performed using the standard protocol during bolus administration of intravenous contrast. Multiplanar CT image reconstructions and MIPs were obtained to evaluate the vascular anatomy. RADIATION DOSE REDUCTION: This exam was performed according to the departmental dose-optimization program which includes automated exposure control, adjustment of the mA and/or kV according to patient size and/or use of iterative reconstruction technique. CONTRAST:  75mL OMNIPAQUE IOHEXOL 350 MG/ML SOLN COMPARISON:  Chest radiographs obtained earlier today and on 09/14/2023. Chest CTA dated 01/06/2022. FINDINGS: Cardiovascular: Normally opacified pulmonary arteries with no pulmonary arterial filling defects seen. Mildly enlarged heart. Small pericardial effusion. Mediastinum/Nodes: Mildly enlarged  bilateral hilar and subcarinal lymph nodes. These include a right hilar lymph node with a short axis diameter of 12 mm on image number 65/5, left hilar node with a short axis diameter of 13 mm on image number 67/5 and subcarinal node with a short axis diameter of 12 mm on image number 75/5. Unremarkable trachea and esophagus. Small hiatal hernia. Lungs/Pleura: Minimal right basilar dependent atelectasis. Minimal right pleural effusion. Clear left lung. Stable small, normal  appearing perifissural lymph node on the left. Upper Abdomen: Unremarkable. Musculoskeletal: Lower cervical spine degenerative changes. Review of the MIP images confirms the above findings. IMPRESSION: 1. No evidence of pulmonary emboli. 2. Minimal right basilar dependent atelectasis. 3. Minimal right pleural effusion. 4. Mild cardiomegaly and small pericardial effusion. 5. Mildly enlarged bilateral hilar and subcarinal lymph nodes, nonspecific and most likely reactive. 6. Small hiatal hernia. Electronically Signed   By: Beckie Salts M.D.   On: 11/04/2023 18:03   CT ANGIO HEAD NECK W WO CM Result Date: 11/04/2023 CLINICAL DATA:  Headache, sudden, severe. Status post angioplasty and stenting of the intracranial right ICA last month. EXAM: CT ANGIOGRAPHY HEAD AND NECK WITH AND WITHOUT CONTRAST TECHNIQUE: Multidetector CT imaging of the head and neck was performed using the standard protocol during bolus administration of intravenous contrast. Multiplanar CT image reconstructions and MIPs were obtained to evaluate the vascular anatomy. Carotid stenosis measurements (when applicable) are obtained utilizing NASCET criteria, using the distal internal carotid diameter as the denominator. RADIATION DOSE REDUCTION: This exam was performed according to the departmental dose-optimization program which includes automated exposure control, adjustment of the mA and/or kV according to patient size and/or use of iterative reconstruction technique. CONTRAST:   75mL OMNIPAQUE IOHEXOL 350 MG/ML SOLN COMPARISON:  CTA head and neck 09/13/2023 FINDINGS: CTA NECK FINDINGS Aortic arch: Standard branching with widely patent arch vessel origins. Right carotid system: Patent without evidence of stenosis or dissection. Left carotid system: Patent without evidence of stenosis or dissection. Vertebral arteries: Patent without evidence of stenosis or dissection. Strongly dominant left vertebral artery. Skeleton: Focally prominent disc degeneration at C5-6. Other neck: No evidence of cervical lymphadenopathy or mass. Upper chest: Clear lung apices. Review of the MIP images confirms the above findings CTA HEAD FINDINGS Anterior circulation: The intracranial left ICA is widely patent. A stent is present in the distal right petrous ICA and appears patent. Distal to the stent, the right cavernous ICA is patent but appears diffusely irregular and moderately to severely narrowed with possible filling defect, and there is a new discrete filling defect measuring 3 mm in length in the right supraclinoid ICA. ACAs and MCAs are patent without evidence of a proximal branch occlusion or significant proximal stenosis. No aneurysm is identified. Posterior circulation: The intracranial vertebral arteries are widely patent to the basilar. Patent PICA and SCA origins are visualized bilaterally. The basilar artery is widely patent. There are patent posterior communicating arteries bilaterally. Both PCAs are patent without evidence of a significant proximal stenosis. No aneurysm is identified. Venous sinuses: Patent. Anatomic variants: Hypoplastic right A1 segment. Review of the MIP images confirms the above findings IMPRESSION: 1. New 3 mm filling defect/possible thrombus in the right supraclinoid ICA. Diffuse irregularity and moderate to severe narrowing of the right cavernous ICA distal to the stent with possible thrombus in this region as well. 2. Widely patent cervical carotid and vertebral arteries.  Electronically Signed   By: Sebastian Ache M.D.   On: 11/04/2023 17:29   CT Head Wo Contrast Result Date: 11/04/2023 CLINICAL DATA:  Sudden severe headache. Stenosis with stent placement. EXAM: CT HEAD WITHOUT CONTRAST TECHNIQUE: Contiguous axial images were obtained from the base of the skull through the vertex without intravenous contrast. RADIATION DOSE REDUCTION: This exam was performed according to the departmental dose-optimization program which includes automated exposure control, adjustment of the mA and/or kV according to patient size and/or use of iterative reconstruction technique. COMPARISON:  09/13/2023 FINDINGS: Brain: There is regional artifact  presumably due to material external to the patient. Allowing for that, the brain has normal appearance without evidence of hemorrhage, stroke, mass, hydrocephalus or extra-axial collection. Right carotid stent noted at the carotid canal region. Vascular: Right carotid stent as above Skull: Normal Sinuses/Orbits: Inflammatory changes of the paranasal sinuses on the left including an air-fluid level in the left sphenoid sinus. This could relate to headache. Other: None IMPRESSION: 1. No acute intracranial finding. Right carotid stent noted at the carotid canal region. 2. Inflammatory changes of the paranasal sinuses on the left including an air-fluid level in the left sphenoid sinus. This could relate to headache. Electronically Signed   By: Paulina Fusi M.D.   On: 11/04/2023 16:49   DG Chest 2 View Result Date: 11/04/2023 CLINICAL DATA:  Chest pain. EXAM: CHEST - 2 VIEW COMPARISON:  09/14/2023. FINDINGS: Bilateral lung fields are clear. Bilateral costophrenic angles are clear. Note is made of elevated right hemidiaphragm. Normal cardio-mediastinal silhouette. No acute osseous abnormalities. The soft tissues are within normal limits. IMPRESSION: No active cardiopulmonary disease. Electronically Signed   By: Jules Schick M.D.   On: 11/04/2023 15:36    Assessment/Plan AMORIE RENTZ is a 43 y.o. female with medical history significant for anxiety, HTN, migraines, hypokalemia, ADD, HLD and right internal carotid artery stenosis s/p stent placement about 3 weeks ago who presents to the ED for evaluation of shortness of breath, chest pain and headache and found to have influenza A infection.  # Influenza A infection Patient presented with 3 days of headache and 1 day of shortness of breath and chest discomfort. RVP positive for influenza A PCR. CXR without any evidence of pneumonia. Patient afebrile with no leukocytosis. Out of the window for Tamiflu to be effective. Patient tachycardic on exam likely due to hypovolemia in the setting of acute flu infection. S/p 1.5 L IV NS -Admit for observation -Give additional 1 L IV NS bolus -Start IV NS 100 cc/h for 10 hours -Droplet precautions  # Headache Patient with history of migraine headache and sinusitis presenting with 3 days of headache likely provoked by acute flu infection. CT head negative. -Tylenol as needed for headache -Will consider migraine cocktail if Tylenol is not effective  # Hypokalemia Patient with history of hypokalemia and found to have K+ of 2.7. Status post IV and oral potassium supplementation in the ED. -Follow-up morning BMP  # Hypomagnesemia Magnesium slightly low at 1.4. Status post IV mag 2 g in the ED. -Follow-up repeat mag  # Prolonged Qtc EKG shows sinus tach and prolonged QTc of 523. This is likely in the setting of hypokalemia and hypomagnesemia. -Continue electrolyte repletion -Avoid QTc prolonging meds -Repeat EKG in the morning  # Chest pain Patient reports right-sided chest pain that started today with some shortness of breath. She described the pain as muscle pulling. Trops negative x 2. EKG without acute ischemic changes. She has mild tenderness to palpation of the chest wall. This is likely MSK-related chest pain. Will consider further workup for  myocarditis if persistent. -Apply lidocaine patch -CTM  # Right ICA stenosis s/p stenting Patient had endovascular treatment of her right ICA stenosis on 1/22.  She reported headache that is mostly on her left side.  CTA head and neck showed possible small thrombus distal to the right ICA stent.  Neurology and neurointerventional radiologist were consulted by ED provider but they did not feel this is contributing to her symptoms.  They recommend continuing medical management. -Continue aspirin and  Plavix  # HTN BP elevated with SBP in the 130s to 150s. -Continue amlodipine  # HLD -Continue atorvastatin  # Anxiety -Continue Xanax   DVT prophylaxis: Lovenox     Code Status: Full Code  Consults called: Neurology, neurointerventional radiologist  Family Communication: No family at bedside  Severity of Illness: The appropriate patient status for this patient is OBSERVATION. Observation status is judged to be reasonable and necessary in order to provide the required intensity of service to ensure the patient's safety. The patient's presenting symptoms, physical exam findings, and initial radiographic and laboratory data in the context of their medical condition is felt to place them at decreased risk for further clinical deterioration. Furthermore, it is anticipated that the patient will be medically stable for discharge from the hospital within 2 midnights of admission.   Level of care: Telemetry Medical   Steffanie Rainwater, MD 11/04/2023, 9:00 PM [facility] Pager: [phone] Isaiah 41:10   If 7PM-7AM, please contact night-coverage [email or web address] Password [email or web address]

## 2023-11-04 NOTE — Telephone Encounter (Signed)
 Chief Complaint: Questions about transporation to ED Symptoms: left shoulder pain Frequency: now Pertinent Negatives: Patient denies n/a Disposition: [x] ED /[] Urgent Care (no appt availability in office) / [] Appointment(In office/virtual)/ []  [facility]/ [] Home Care/ [] Refused Recommended Disposition /[]  Mobile Bus/ []  Follow-up with PCP Additional Notes: Patient called in, she was triaged earlier with a RN and recommended to go to ED for evaluation. Patient was asking if it's okay for her to wait for her son to pick her up in roughly 10 minutes or does she need to call an ambulance? Patient states she is having muscle ache in her left shoulder as well. This RN advised patient if her son can come directly to her and take her to the ER, that would be acceptable, but if it would be longer than an hour that it is advised to call 911. Patient verbalized understanding.     Reason for Disposition  Health Information question, no triage required and triager able to answer question  Answer Assessment - Initial Assessment Questions 1. REASON FOR CALL or QUESTION: "What is your reason for calling today?" or "How can I best help you?" or "What question do you have that I can help answer?"     Patient inquiring if her son is taking 10 minutes to pick her up, if she should call 911. Please see nurse triage notes  Protocols used: Information Only Call - No Triage-A-AH

## 2023-11-04 NOTE — ED Triage Notes (Signed)
 Pt BIB [facility] from home with reports of chest pain and SHOB. PT had recent stent placed in carotid.  HR 142 RR 18 405mg  ASA PTA 250cc Saline

## 2023-11-04 NOTE — ED Provider Notes (Addendum)
 Accepted handoff at shift change from Sarah Smoot,  PA-C. Please see prior provider note for more detail.  Briefly: Patient is 43 y.o. with history of recent right carotid stent placement presenting for headache.  DDX: concern for  carotid stent failure, Stroke, increased ICP, meningitis, CVA, intracranial tumor, venous sinus thrombosis, migraine, cluster headache, hypertension, drug related, tension headache, sinusitis, TMJ, trigeminal neuralgia.   Plan: Follow-up on CTA of the head neck and chest and    Physical Exam   Vitals:   11/04/23 1930 11/04/23 2000  BP: (!) 151/95 (!) 153/92  Pulse: (!) 120 (!) 113  Resp: 17 (!) 22  Temp:    SpO2: 98% 98%    CONSTITUTIONAL:  well-appearing, NAD NEURO:  GCS 15. Speech is goal oriented. No deficits appreciated to CN III-XII; symmetric eyebrow raise, no facial drooping, tongue midline. Patient has equal grip strength bilaterally with 5/5 strength against resistance in all major muscle groups bilaterally. Sensation to light touch intact. Patient moves extremities without ataxia. Normal finger-nose-finger. Did not assess gait.  EYES:  eyes equal and reactive ENT/NECK:  Supple, no stridor  CARDIO:  Tachycardia and regular rhythm, appears well-perfused  PULM:  No respiratory distress, CTAB GI/GU:  non-distended, soft MSK/SPINE:  No gross deformities, no edema, moves all extremities  SKIN:  no rash, atraumatic   *Additional and/or pertinent findings included in MDM below    Procedures  Procedures  ED Course / MDM   Clinical Course as of 11/04/23 1938  Mon Nov 04, 2023  1504 Chest pain and recent carotid stent placed for headaches. Headache is back. Started Saturday.  No FND. HR in 130s. Getting CTA of chest and head and neck.  [JR]    Clinical Course User Index [JR] Janalee Mcmurray, PA-C   Medical Decision Making Amount and/or Complexity of Data Reviewed Labs: ordered. Radiology: ordered.  Risk Prescription drug  management. Decision regarding hospitalization.   On reassessment, headache was marginally improved.  Also had a discussion with the patient about her headache.  She stated that most of the pain was localized in the left temporal region.  Treated with morphine .  Noted that her heart rate was still elevated in the 120s after receiving 1.5 L of fluid.  CTA of the head neck concerning for possible thrombus in the right carotid.  Discussed these findings with Dr. Portia Brittle of neurology.  He stated that since her headache symptoms of her left side it is unlikely that the potential thrombus or within the right carotid is related.  He did advised to consult neuro interventional radiology.  I spoke to Dr. Alvira Josephs who advised that for now no intervention is indicated and to encourage the patient to continue taking her Plavix  and aspirin  and to stay well-hydrated.  CTA of the chest was negative for PE but there was some concern for fluid around the heart along with mild cardiomegaly.  Patient was still endorsing some intermittent left and right sided chest pain along with intermittent shortness of breath and persistently tachycardic in the setting of having the flu.  I was somewhat concerned for myocarditis as well.  EKG and troponins are reassuring thus far.  Attempted to ambulate her and she was very weak and her heart rate spiked to the 140s.  For this reason I admitted her to the hospital service for further evaluation and management.  Dr. Yvonne Hering was receiving attending hospitalist.  Also gave IV mag and IV K for hypomagnesemia and hypokalemia respectively.  Janalee Mcmurray, PA-C 11/04/23 2125    Afton Horse T, DO 11/04/23 2259

## 2023-11-04 NOTE — Telephone Encounter (Signed)
 Chief Complaint: hypertension Symptoms: hypertension, tachycardia, headache, lightheadedness Frequency: today Pertinent Negatives: Patient denies CP, SOB Disposition: [x] ED /[] Urgent Care (no appt availability in office) / [] Appointment(In office/virtual)/ []  [facility]/ [] Home Care/ [] Refused Recommended Disposition /[]  Mobile Bus/ []  Follow-up with PCP Additional Notes: Pt reports BP of 172/102 and a HR of 112 this AM. Pt reports she has missed no doses of amlodipine . Pt also reports 9/10 left-sided headache namely in the temple. Hx of R sided carotid stent placed 1/22. Denies one-sided weakness or paralysis. No slurred speech. Does endorse lightheadedness with standing/position changes. Also endorses runny nose and sneezing since Saturday. Pt rechecked her HR while on the phone with the RN. HR now 136.  Per protocol and pt's history, RN advised pt she has to go to the ED. RN told pt RN would call 911 for transport. Then pt told RN [place]. RN told pt RN would call 911 and have the ambulance meet her at the courthouse. Pt told RN she really did not want an ambulance called and also that she needs to pay this ticket today. Pt told RN she would call a friend and have a friend bring her to the ED instead. Pt was clear that she would follow dispo instruction and go to the ED. RN advised pt that if she can not find a friend or ride, she needs to call 911 or have someone call 911 for her and that she should not drive to the ED because of her lightheadedness, pt verbalized understanding.   Copied from CRM [id number]. Topic: Clinical - Red Word Triage >> Nov 04, 2023 10:12 AM [employer] wrote: Red Word that prompted transfer to Nurse Triage: SEVERE HEADACHE  Patient has running nose, headache since Saturday, BP 172/102  and pulse 112. Light headed when standing. Temperature is 98.4. Reason for Disposition  [1] Systolic  BP  >= 696 OR Diastolic >= 100 AND [2] cardiac (e.g., breathing difficulty, chest pain) or neurologic symptoms (e.g., new-onset blurred or double vision, unsteady gait)  Answer Assessment - Initial Assessment Questions 1. BLOOD PRESSURE: "What is the blood pressure?" "Did you take at least two measurements 5 minutes apart?"     172/102 at 0730 this AM. 1/22 - had a stent placed. 2. ONSET: "When did you take your blood pressure?"     This AM 3. HOW: "How did you take your blood pressure?" (e.g., automatic home BP monitor, visiting nurse)     Automatic cuff 4. HISTORY: "Do you have a history of high blood pressure?"     Yes, takes amlodipine  5. MEDICINES: "Are you taking any medicines for blood pressure?" "Have you missed any doses recently?"     Amlodipine  - no missed doses 6. OTHER SYMPTOMS: "Do you have any symptoms?" (e.g., blurred vision, chest pain, difficulty breathing, headache, weakness)     Lightheadedness with standing ("have to not get up fast"), dissipates once she sits down, headache with 8-9/10 pain @ the L temple, runny nose, sneezing, HR 112 this AM, HR 136 now, no CP, "feel a little winded with my heart rate being that high", no one-sided weakness or paralysis  Protocols used: Blood Pressure - High-A-AH

## 2023-11-04 NOTE — ED Provider Notes (Signed)
[place] EMERGENCY DEPARTMENT AT [facility] Provider Note   CSN: [id number] Arrival date & time: 11/04/23  1250     History  Chief Complaint  Patient presents with   Chest Pain    Diane [name] is a 43 y.o. female.  Patient with history of anxiety, crohn's disease, hypertension, chronic hypokalemia, R ICA stenosis status post stent placement on 10/16/2023 presents today with complaints of headache, chest pain, and shortness of breath. She states that her headache began on Saturday and is severe. States her headache is similar to headaches she had that resulted in her stent placement. She has not had a headache like this since she had the stent placed.  She denies any vision changes.  Notes that she started feeling pain in her chest as well as shortness of breath throughout the day today as well.  Pain is intermittent in nature and each time occurs in a different position in her chest.  She states she does not feel short of breath when lying in bed but does have some increased dyspnea on exertion.  Denies leg pain or leg swelling or recent travel.  She has been compliant with her Plavix. Does also note some sinus congestion, and states she has been sneezing a lot the past few days.  The history is provided by the patient. No language interpreter was used.  Chest Pain Associated symptoms: headache and shortness of breath        Home Medications Prior to Admission medications   Medication Sig Start Date End Date Taking? Authorizing Provider  acetaminophen (TYLENOL) 325 MG tablet Take 2 tablets (650 mg total) by mouth every 4 (four) hours as needed (for pain scale < 4). Patient not taking: Reported on 10/15/2023 04/17/18   Henagar Bing, MD  ALPRAZolam Prudy Feeler) 0.5 MG tablet Take 0.5 mg by mouth at bedtime. 10/10/23   [provider]  amLODipine (NORVASC) 5 MG tablet Take 1 tablet (5 mg total) by mouth daily. 10/14/23   Paseda, Baird Kay, FNP   amphetamine-dextroamphetamine (ADDERALL) 30 MG tablet Take 30 mg by mouth daily. Patient not taking: Reported on 10/14/2023    [provider]  aspirin 81 MG chewable tablet Chew 1 tablet (81 mg total) by mouth daily. 09/13/23   Rancour, Jeannett Senior, MD  atorvastatin (LIPITOR) 20 MG tablet Take 1 tablet (20 mg total) by mouth daily. 10/15/23 10/14/24  Donell Beers, FNP  cetirizine (ZYRTEC ALLERGY) 10 MG tablet Take 1 tablet (10 mg total) by mouth daily. 10/14/23 04/11/24  Donell Beers, FNP  clopidogrel (PLAVIX) 75 MG tablet Take 1 tablet (75 mg total) by mouth daily. 10/03/23   Bruning, Caryn Bee, PA-C  ELDERBERRY PO Take 2 tablets by mouth daily at 2 am. Gummy    [provider]  gabapentin (NEURONTIN) 100 MG capsule Take 1 capsule (100 mg total) by mouth 3 (three) times daily. Patient not taking: Reported on 10/14/2023 09/28/23   Gareth Eagle, PA-C  lidocaine (LIDODERM) 5 % Place 1 patch onto the skin daily. Remove & Discard patch within 12 hours or as directed by MD Patient not taking: Reported on 10/14/2023 09/28/23   Glyn Ade, MD  ondansetron (ZOFRAN-ODT) 4 MG disintegrating tablet Take 1 tablet (4 mg total) by mouth every 8 (eight) hours as needed for nausea or vomiting. Patient not taking: Reported on 10/14/2023 09/15/23   Kommor, Wyn Forster, MD  Saline 0.65 % SOLN Place 1 spray into the nose daily.    [provider]      Allergies    Chlorhexidine    Review of Systems   Review of Systems  Respiratory:  Positive for shortness of breath.   Cardiovascular:  Positive for chest pain.  Neurological:  Positive for headaches.  All other systems reviewed and are negative.   Physical Exam Updated Vital Signs BP (!) 155/96   Pulse (!) 116   Temp 98.9 F (37.2 C) (Oral)   Resp 12   LMP 10/02/2023   SpO2 100%  Physical Exam Vitals and nursing note reviewed.  Constitutional:      General: She is not in acute distress.    Appearance: Normal appearance.  She is normal weight. She is not ill-appearing, toxic-appearing or diaphoretic.  HENT:     Head: Normocephalic and atraumatic.  Neck:     Comments: No meningismus Cardiovascular:     Rate and Rhythm: Normal rate and regular rhythm.     Heart sounds: Normal heart sounds.  Pulmonary:     Effort: Pulmonary effort is normal. No respiratory distress.     Breath sounds: Normal breath sounds.  Abdominal:     Palpations: Abdomen is soft.     Tenderness: There is no abdominal tenderness.  Musculoskeletal:        General: Normal range of motion.     Cervical back: Normal range of motion.     Right lower leg: No tenderness. No edema.     Left lower leg: No tenderness. No edema.  Skin:    General: Skin is warm and dry.  Neurological:     General: No focal deficit present.     Mental Status: She is alert and oriented to person, place, and time.     GCS: GCS eye subscore is 4. GCS verbal subscore is 5. GCS motor subscore is 6.     Sensory: Sensation is intact.     Motor: Motor function is intact.     Coordination: Coordination is intact.     Gait: Gait is intact.     Comments: Alert and oriented to self, place, time and event.    Speech is fluent, clear without dysarthria or dysphasia.    Strength 5/5 in upper/lower extremities   Sensation intact in upper/lower extremities    CN I not tested  CN II grossly intact visual fields bilaterally. Did not visualize posterior eye.  CN III, IV, VI PERRLA and EOMs intact bilaterally  CN V Intact sensation to sharp and light touch to the face  CN VII facial movements symmetric  CN VIII not tested  CN IX, X no uvula deviation, symmetric rise of soft palate  CN XI 5/5 SCM and trapezius strength bilaterally  CN XII Midline tongue protrusion, symmetric L/R movements   Psychiatric:        Mood and Affect: Mood normal.        Behavior: Behavior normal.     ED Results / Procedures / Treatments   Labs (all labs ordered are listed, but only  abnormal results are displayed) Labs Reviewed  BASIC METABOLIC PANEL - Abnormal; Notable for the following components:      Result Value   Potassium 2.7 (*)    Calcium 8.7 (*)    All other components within normal limits  CBC - Abnormal; Notable for the following components:   Hemoglobin 11.5 (*)    HCT 34.1 (*)    All other components within normal limits  RESP PANEL BY RT-PCR (RSV, FLU A&B, COVID)  RVPGX2  HCG, SERUM, QUALITATIVE  MAGNESIUM  TROPONIN I (HIGH SENSITIVITY)  TROPONIN I (HIGH SENSITIVITY)    EKG EKG Interpretation Date/Time:  Monday November 04 2023 13:40:26 EST Ventricular Rate:  130 PR Interval:  95 QRS Duration:  87 QT Interval:  355 QTC Calculation: 523 R Axis:   34  Text Interpretation: Sinus tachycardia Borderline repolarization abnormality Prolonged QT interval Otherwise no significant change Confirmed by Elayne Snare ([id number]) on 11/04/2023 2:53:13 PM  Radiology DG Chest 2 View Result Date: 11/04/2023 CLINICAL DATA:  Chest pain. EXAM: CHEST - 2 VIEW COMPARISON:  09/14/2023. FINDINGS: Bilateral lung fields are clear. Bilateral costophrenic angles are clear. Note is made of elevated right hemidiaphragm. Normal cardio-mediastinal silhouette. No acute osseous abnormalities. The soft tissues are within normal limits. IMPRESSION: No active cardiopulmonary disease. Electronically Signed   By: Jules Schick M.D.   On: 11/04/2023 15:36    Procedures .Critical Care  Performed by: Silva Bandy, PA-C Authorized by: Silva Bandy, PA-C   Critical care provider statement:    Critical care time (minutes):  35   Critical care was necessary to treat or prevent imminent or life-threatening deterioration of the following conditions: hypokalemia requiring IV replacement, persistent tachycardia despite fluids.   Critical care was time spent personally by me on the following activities:  Development of treatment plan with patient or surrogate, evaluation of patient's  response to treatment, examination of patient, obtaining history from patient or surrogate, ordering and review of laboratory studies, ordering and review of radiographic studies, pulse oximetry, re-evaluation of patient's condition and review of old charts     Medications Ordered in ED Medications  potassium chloride 10 mEq in 100 mL IVPB (10 mEq Intravenous New Bag/Given 11/04/23 1530)  prochlorperazine (COMPAZINE) injection 10 mg (10 mg Intravenous Given 11/04/23 1420)  diphenhydrAMINE (BENADRYL) injection 25 mg (25 mg Intravenous Given 11/04/23 1420)  morphine (PF) 4 MG/ML injection 4 mg (4 mg Intravenous Given 11/04/23 1420)  sodium chloride 0.9 % bolus 1,000 mL (0 mLs Intravenous Stopped 11/04/23 1524)  potassium chloride SA (KLOR-CON M) CR tablet 40 mEq (40 mEq Oral Given 11/04/23 1517)    ED Course/ Medical Decision Making/ A&P Clinical Course as of 11/04/23 1542  Mon Nov 04, 2023  1504 Chest pain and recent carotid stent placed for headaches. Headache is back. Started Saturday.  No FND. HR in 130s. Getting CTA of chest and head and neck.  [JR]    Clinical Course User Index [JR] Gareth Eagle, PA-C                                 Medical Decision Making Amount and/or Complexity of Data Reviewed Labs: ordered. Radiology: ordered.  Risk Prescription drug management. Decision regarding hospitalization.   This patient is a 43 y.o. female who presents to the ED for concern of headache, chest pain, shortness of breath, this involves an extensive number of treatment options, and is a complaint that carries with it a high risk of complications and morbidity. The emergent differential diagnosis prior to evaluation includes, but is not limited to,  carotid stent failure, Stroke, increased ICP, meningitis, CVA, intracranial tumor, venous sinus thrombosis, migraine, cluster headache, hypertension, drug related, tension headache, sinusitis, TMJ, trigeminal neuralgia.   Ddx for chest pain:  ACS, pericarditis, myocarditis, aortic dissection, PE, pneumothorax, esophageal rupture, pneumonia, reflux/PUD, biliary disease, pancreatitis, costochondritis, anxiety  Ddx shortness of breath: CHF, pericardial effusion/tamponade,  arrhythmias, ACS, COPD, asthma, bronchitis, pneumonia, pneumothorax, PE, anemia   This is not an exhaustive differential.   Past Medical History / Co-morbidities / Social History:  has a past medical history of ADD (attention deficit disorder), Anemia, Anxiety, Crohn's disease (HCC), D-dimer, elevated, Fibroid, Headache(784.0), Heart murmur, History of endometriosis, Hypertension, Leiomyoma (02/09/2015), Low blood potassium, Migraine without aura, with intractable migraine, so stated, without mention of status migrainosus (06/19/2013), Pneumonia, PONV (postoperative nausea and vomiting), Seasonal allergies, Vaginal Pap smear, abnormal, and Vasovagal syncope.  Additional history: Chart reviewed. Pertinent results include: patient had R ICA stenosis with stent placement on 1/22 with IR Dr. Corliss Skains  Physical Exam: Physical exam performed. The pertinent findings include: alert and oriented and neurologically intact without focal neurologic deficits.  She is notably tachycardic to the 130s.  Speaking in complete sentences without any signs of distress.  Lab Tests: I ordered, and personally interpreted labs.  The pertinent results include: No leukocytosis, hgb 11.5 consistent with baseline. K 2.7. Other labs pending at shift change.   Imaging Studies: I ordered imaging studies including CXR, CTA PE, CTA head and neck. I independently visualized and interpreted imaging which showed   CXR: NAD  I agree with the radiologist interpretation.  Cts are pending at shift change   Cardiac Monitoring:  The patient was maintained on a cardiac monitor.  My attending physician Dr. Theresia Lo viewed and interpreted the cardiac monitored which showed an underlying rhythm of: sinus  tachycardia, prolonged QT. I agree with this interpretation.   Medications: I ordered medication including fluids, compazine, benadryl, morphine, potassium  for headache, hypokalemia. Reevaluation of the patient after these medicines showed that the patient improved. I have reviewed the patients home medicines and have made adjustments as needed.  Disposition:  Patients labs and CT imaging are pending at shift change and will determine dispo.   Care handoff to Riki Sheer, PA-C at shift change.  Please see their note for continued evaluation and dispo.  Final Clinical Impression(s) / ED Diagnoses Final diagnoses:  None    Rx / DC Orders ED Discharge Orders     None         Silva Bandy, PA-C 11/05/23 0721    Rexford Maus, DO 11/05/23 [phone]

## 2023-11-04 NOTE — ED Notes (Signed)
 Patient attempted to ambulate patient. Patient became weak, lightheaded, and dizzy. HR went from 120 to 140 upon standing. Provider notified.

## 2023-11-05 DIAGNOSIS — R519 Headache, unspecified: Secondary | ICD-10-CM

## 2023-11-05 DIAGNOSIS — J101 Influenza due to other identified influenza virus with other respiratory manifestations: Secondary | ICD-10-CM | POA: Diagnosis not present

## 2023-11-05 DIAGNOSIS — R000 Tachycardia, unspecified: Secondary | ICD-10-CM

## 2023-11-05 LAB — MAGNESIUM: Magnesium: 1.8 mg/dL (ref 1.7–2.4)

## 2023-11-05 LAB — BASIC METABOLIC PANEL
Anion gap: 12 (ref 5–15)
BUN: 9 mg/dL (ref 6–20)
CO2: 21 mmol/L — ABNORMAL LOW (ref 22–32)
Calcium: 8 mg/dL — ABNORMAL LOW (ref 8.9–10.3)
Chloride: 105 mmol/L (ref 98–111)
Creatinine, Ser: 0.65 mg/dL (ref 0.44–1.00)
GFR, Estimated: >60 mL/min (ref >60)
Glucose, Bld: 106 mg/dL — ABNORMAL HIGH (ref 70–99)
Potassium: 2.9 mmol/L — ABNORMAL LOW (ref 3.5–5.1)
Sodium: 138 mmol/L (ref 135–145)

## 2023-11-05 LAB — CBC
HCT: 31.5 % — ABNORMAL LOW (ref 36.0–46.0)
Hemoglobin: 10.9 g/dL — ABNORMAL LOW (ref 12.0–15.0)
MCH: 30.4 pg (ref 26.0–34.0)
MCHC: 34.6 g/dL (ref 30.0–36.0)
MCV: 88 fL (ref 80.0–100.0)
Platelets: 202 10*3/uL (ref 150–400)
RBC: 3.58 MIL/uL — ABNORMAL LOW (ref 3.87–5.11)
RDW: 13.7 % (ref 11.5–15.5)
WBC: 4 10*3/uL (ref 4.0–10.5)
nRBC: 0 % (ref 0.0–0.2)

## 2023-11-05 LAB — PHOSPHORUS: Phosphorus: 2.6 mg/dL (ref 2.5–4.6)

## 2023-11-05 LAB — HIV ANTIBODY (ROUTINE TESTING W REFLEX): HIV Screen 4th Generation wRfx: NONREACTIVE

## 2023-11-05 MED ORDER — PROCHLORPERAZINE EDISYLATE 10 MG/2ML IJ SOLN
10.0000 mg | Freq: Once | INTRAMUSCULAR | Status: AC
  Administered 2023-11-05: 10 mg via INTRAVENOUS
  Filled 2023-11-05: qty 2

## 2023-11-05 MED ORDER — ORAL CARE MOUTH RINSE: 15.0000 mL | OROMUCOSAL | Status: DC | PRN

## 2023-11-05 MED ORDER — ASPIRIN 81 MG PO CHEW
81.0000 mg | CHEWABLE_TABLET | Freq: Every day | ORAL | Status: DC
  Administered 2023-11-05 – 2023-11-07 (×3): 81 mg via ORAL
  Filled 2023-11-05 (×3): qty 1

## 2023-11-05 MED ORDER — ALPRAZOLAM 0.5 MG PO TABS
0.5000 mg | ORAL_TABLET | Freq: Every day | ORAL | Status: DC
  Administered 2023-11-05 – 2023-11-06 (×3): 0.5 mg via ORAL
  Filled 2023-11-05 (×2): qty 1
  Filled 2023-11-05: qty 2

## 2023-11-05 MED ORDER — LORATADINE 10 MG PO TABS
10.0000 mg | ORAL_TABLET | Freq: Every day | ORAL | Status: DC
  Administered 2023-11-05 – 2023-11-07 (×3): 10 mg via ORAL
  Filled 2023-11-05 (×3): qty 1

## 2023-11-05 MED ORDER — DM-GUAIFENESIN ER 30-600 MG PO TB12
1.0000 | ORAL_TABLET | Freq: Two times a day (BID) | ORAL | Status: DC
  Administered 2023-11-05 – 2023-11-07 (×4): 1 via ORAL
  Filled 2023-11-05 (×4): qty 1

## 2023-11-05 MED ORDER — SODIUM CHLORIDE 0.9 % IV SOLN: INTRAVENOUS | Status: AC

## 2023-11-05 MED ORDER — KETOROLAC TROMETHAMINE 15 MG/ML IJ SOLN
15.0000 mg | Freq: Once | INTRAMUSCULAR | Status: AC
  Administered 2023-11-05: 15 mg via INTRAVENOUS
  Filled 2023-11-05: qty 1

## 2023-11-05 MED ORDER — MAGNESIUM SULFATE 2 GM/50ML IV SOLN
2.0000 g | Freq: Once | INTRAVENOUS | Status: AC
  Administered 2023-11-05: 2 g via INTRAVENOUS
  Filled 2023-11-05: qty 50

## 2023-11-05 MED ORDER — CLOPIDOGREL BISULFATE 75 MG PO TABS
75.0000 mg | ORAL_TABLET | Freq: Every day | ORAL | Status: DC
  Administered 2023-11-05 – 2023-11-07 (×3): 75 mg via ORAL
  Filled 2023-11-05 (×3): qty 1

## 2023-11-05 MED ORDER — ATORVASTATIN CALCIUM 10 MG PO TABS
20.0000 mg | ORAL_TABLET | Freq: Every day | ORAL | Status: DC
  Administered 2023-11-05 – 2023-11-06 (×3): 20 mg via ORAL
  Filled 2023-11-05 (×3): qty 2

## 2023-11-05 MED ORDER — POTASSIUM CHLORIDE CRYS ER 20 MEQ PO TBCR
40.0000 meq | EXTENDED_RELEASE_TABLET | ORAL | Status: AC
  Administered 2023-11-05 (×2): 40 meq via ORAL
  Filled 2023-11-05 (×3): qty 2

## 2023-11-05 MED ORDER — DIPHENHYDRAMINE HCL 50 MG/ML IJ SOLN
12.5000 mg | Freq: Once | INTRAMUSCULAR | Status: AC
  Administered 2023-11-05: 12.5 mg via INTRAVENOUS
  Filled 2023-11-05: qty 1

## 2023-11-05 MED ORDER — HYDROCOD POLI-CHLORPHE POLI ER 10-8 MG/5ML PO SUER
5.0000 mL | Freq: Two times a day (BID) | ORAL | Status: DC | PRN
  Administered 2023-11-05 – 2023-11-06 (×3): 5 mL via ORAL
  Filled 2023-11-05 (×3): qty 5

## 2023-11-05 MED ORDER — LIDOCAINE 5 % EX PTCH
1.0000 | MEDICATED_PATCH | CUTANEOUS | Status: DC
  Filled 2023-11-05 (×2): qty 1

## 2023-11-05 MED ORDER — AMLODIPINE BESYLATE 5 MG PO TABS
5.0000 mg | ORAL_TABLET | Freq: Every day | ORAL | Status: DC
  Administered 2023-11-05 – 2023-11-07 (×3): 5 mg via ORAL
  Filled 2023-11-05 (×3): qty 1

## 2023-11-05 NOTE — Progress Notes (Addendum)
Patient states she left her cell phone charger with cord and block in room 38 in the ED. This RN called to ask ED staff to check. Selena Batten, RN in ED said she would look for it and send it up when/if found.     Charger found and returned to patient.

## 2023-11-05 NOTE — Plan of Care (Signed)

## 2023-11-05 NOTE — Progress Notes (Signed)
Progress Note   Patient: Diane [name] OZH:[id number] DOB: 12-04-1980 DOA: 11/04/2023     0 DOS: the patient was seen and examined on 11/05/2023   Brief hospital course:   Assessment and Plan:  #Headache H/o migraines Patient with  pulsatile L temple headache that is improved.  -HA cocktail   #Chest pain Resolved. Likely MSK related. Mycoarditis less likely given normal troponin level. Patient did mention that she has some chest discomfort with exertion that has been ongoing for some time. She has no evidence of acute ischemia on her EKG or of an old infarct.  -She will follow up outpatient with her PCP and cardiology for further work up  #Influenza A infection Congestion but afebrile and on RA.  Given flu is mild will treat supportively.   #HTN Improved. Conitnue home amlodipine   #Long standing Hypokalemia and Hypomagnesemia  Replete. Patient states she has had extensive work up for this in the past that has been unrevealing.    #R ICA stenosis s/p stenting  Patient noted to have possible small thrombus distal to R ICA stent. Neurology and neuro IR did not feel this was contributing to her symptoms.  -Continue ASA and plavix    #Prolonged Qtc Will recheck Qtc with EKG in the AM.  Avoid Qtc prolonging medications        Subjective: Mild HA in L temple, improved from admission and responded to medications given. No shortness of breath or chest pain.   Physical Exam: Vitals:   11/05/23 1000 11/05/23 1100 11/05/23 1153 11/05/23 1739  BP: 130/87 (!) 132/92 (!) 143/89 120/89  Pulse: (!) 101 99 (!) 101 97  Resp: 13 13 18 18   Temp:      TempSrc:      SpO2: 98% 99% 100% 100%   Physical Exam  Constitutional: In no distress.  Cardiovascular: Normal rate, regular rhythm. No lower extremity edema  Pulmonary: Non labored breathing on room air, no wheezing or rales.  Abdominal: Soft. Normal bowel sounds. Non distended and non tender Musculoskeletal: Normal range of  motion.     Neurological: Alert and oriented to person, place, and time. Non focal  Skin: Skin is warm and dry.   Data Reviewed:  I have reviewed labs and images.     Latest Ref Rng & Units 11/05/2023    4:05 AM 11/04/2023    1:40 PM 10/18/2023    9:13 AM  BMP  Glucose 70 - 99 mg/dL 629  92  528   BUN 6 - 20 mg/dL 9  7  9    Creatinine 0.44 - 1.00 mg/dL 4.13  2.44  0.10   Sodium 135 - 145 mmol/L 138  141  141   Potassium 3.5 - 5.1 mmol/L 2.9  2.7  2.9   Chloride 98 - 111 mmol/L 105  106  108   CO2 22 - 32 mmol/L 21  23  24    Calcium 8.9 - 10.3 mg/dL 8.0  8.7  8.2       Latest Ref Rng & Units 11/05/2023    4:05 AM 11/04/2023    1:40 PM 10/17/2023    4:08 AM  CBC  WBC 4.0 - 10.5 K/uL 4.0  5.6  6.9   Hemoglobin 12.0 - 15.0 g/dL 27.2  53.6  64.4   Hematocrit 36.0 - 46.0 % 31.5  34.1  29.8   Platelets 150 - 400 K/uL 202  241  234      Family Communication: None  Disposition: Status is: Observation The patient remains OBS appropriate and will d/c before 2 midnights.  Planned Discharge Destination: Home    Time spent: 35 minutes  Author: Marolyn Haller, MD 11/05/2023 7:10 PM  For on call review [email or web address].

## 2023-11-06 ENCOUNTER — Observation Stay (HOSPITAL_COMMUNITY): Payer: BC Managed Care – PPO

## 2023-11-06 DIAGNOSIS — Z7901 Long term (current) use of anticoagulants: Secondary | ICD-10-CM | POA: Diagnosis not present

## 2023-11-06 DIAGNOSIS — Z1152 Encounter for screening for COVID-19: Secondary | ICD-10-CM | POA: Diagnosis not present

## 2023-11-06 DIAGNOSIS — R519 Headache, unspecified: Secondary | ICD-10-CM | POA: Diagnosis not present

## 2023-11-06 DIAGNOSIS — J101 Influenza due to other identified influenza virus with other respiratory manifestations: Secondary | ICD-10-CM | POA: Diagnosis not present

## 2023-11-06 LAB — CBC
HCT: 34.3 % — ABNORMAL LOW (ref 36.0–46.0)
Hemoglobin: 11.7 g/dL — ABNORMAL LOW (ref 12.0–15.0)
MCH: 29.5 pg (ref 26.0–34.0)
MCHC: 34.1 g/dL (ref 30.0–36.0)
MCV: 86.4 fL (ref 80.0–100.0)
Platelets: 197 10*3/uL (ref 150–400)
RBC: 3.97 MIL/uL (ref 3.87–5.11)
RDW: 13.5 % (ref 11.5–15.5)
WBC: 4.6 10*3/uL (ref 4.0–10.5)
nRBC: 0 % (ref 0.0–0.2)

## 2023-11-06 LAB — IRON AND TIBC
Iron: 24 ug/dL — ABNORMAL LOW (ref 28–170)
Saturation Ratios: 7 % — ABNORMAL LOW (ref 10.4–31.8)
TIBC: 328 ug/dL (ref 250–450)
UIBC: 304 ug/dL

## 2023-11-06 LAB — BASIC METABOLIC PANEL
Anion gap: 13 (ref 5–15)
BUN: 8 mg/dL (ref 6–20)
CO2: 23 mmol/L (ref 22–32)
Calcium: 8.3 mg/dL — ABNORMAL LOW (ref 8.9–10.3)
Chloride: 101 mmol/L (ref 98–111)
Creatinine, Ser: 0.68 mg/dL (ref 0.44–1.00)
GFR, Estimated: >60 mL/min (ref >60)
Glucose, Bld: 109 mg/dL — ABNORMAL HIGH (ref 70–99)
Potassium: 3.5 mmol/L (ref 3.5–5.1)
Sodium: 137 mmol/L (ref 135–145)

## 2023-11-06 LAB — FERRITIN: Ferritin: 34 ng/mL (ref 11–307)

## 2023-11-06 LAB — MAGNESIUM: Magnesium: 1.7 mg/dL (ref 1.7–2.4)

## 2023-11-06 MED ORDER — POLYETHYLENE GLYCOL 3350 17 G PO PACK
17.0000 g | PACK | Freq: Every day | ORAL | Status: DC
  Administered 2023-11-06 – 2023-11-07 (×2): 17 g via ORAL
  Filled 2023-11-06 (×2): qty 1

## 2023-11-06 MED ORDER — OSELTAMIVIR PHOSPHATE 75 MG PO CAPS
75.0000 mg | ORAL_CAPSULE | Freq: Every day | ORAL | Status: DC
  Administered 2023-11-06 – 2023-11-07 (×2): 75 mg via ORAL
  Filled 2023-11-06 (×2): qty 1

## 2023-11-06 MED ORDER — IRON SUCROSE 200 MG IVPB - SIMPLE MED
200.0000 mg | Freq: Once | Status: AC
  Administered 2023-11-06: 200 mg via INTRAVENOUS
  Filled 2023-11-06: qty 110

## 2023-11-06 MED ORDER — BENZONATATE 100 MG PO CAPS
200.0000 mg | ORAL_CAPSULE | Freq: Three times a day (TID) | ORAL | Status: DC
  Administered 2023-11-06 – 2023-11-07 (×2): 200 mg via ORAL
  Filled 2023-11-06 (×2): qty 2

## 2023-11-06 NOTE — Plan of Care (Signed)

## 2023-11-06 NOTE — Progress Notes (Signed)
Progress Note   Patient: Diane [name] ZOX:[id number] DOB: [birth year] DOA: 11/04/2023     0 DOS: the patient was seen and examined on 11/06/2023   Brief hospital course:   Assessment and Plan:  #Headache H/o migraines Now with bitemporal headache but notes improvement with APAP.  -Continue APAP -Encourage hydration  #Influenza A infection SOB, Congestion and cough worse this AM. Patient also with worsening malaise. Remains afebrile on room air. CXR with retrocardiac opaicty. She has no leukocytosis and is afebrile. Doubt concomitant bacterial PNA -Given she is having worsening symptoms, will start tamiflu  -Check procalcitonin in the AM  -Continue cough suppressants, add tessalon perles   #Mild normocytic anemia  Stable. Iron/TIBC/Ferritin/ %Sat    Component Value Date/Time   IRON 24 (L) 11/06/2023 0839   IRON 42 01/21/2018 1053   TIBC 328 11/06/2023 0839   TIBC 414 01/21/2018 1053   FERRITIN 34 11/06/2023 0839   FERRITIN 11 (L) 01/21/2018 1053   IRONPCTSAT 7 (L) 11/06/2023 0839   IRONPCTSAT 10 (L) 01/21/2018 1053   Will give IV iron and start patient on oral iron supplementation.   #Chest pain Resolved. Likely MSK related. Mycoarditis less likely given normal troponin level. Patient did mention that she has some chest discomfort with exertion that has been ongoing for some time. She has no evidence of acute ischemia on her EKG or of an old infarct.  -She will follow up outpatient with her PCP and cardiology for further work up  #HTN Stable. Continue home amlodipine   #Long standing Hypokalemia and Hypomagnesemia  Resolved. Patient states she has had extensive work up for this in the past that has been unrevealing.    #R ICA stenosis s/p stenting  Patient noted to have possible small thrombus distal to R ICA stent. Neurology and neuro IR did not feel this was contributing to her symptoms.  -Continue ASA and plavix    #Prolonged Qtc Resolved       Subjective:  HA improved with HA cocktail overnight. But it is back and now bitemporal. She also states that she is feeling short of breath and her cough is worse.    Physical Exam: Vitals:   11/05/23 1153 11/05/23 1739 11/05/23 2032 11/06/23 0909  BP: (!) 143/89 120/89 (!) 131/91 (!) 132/90  Pulse: (!) 101 97 100 (!) 107  Resp: 18 18 18 18   Temp:    98.2 F (36.8 C)  TempSrc:    Axillary  SpO2: 100% 100% 100% 93%   Physical Exam  Constitutional: In no distress.  Cardiovascular: Normal rate, regular rhythm. No lower extremity edema  Pulmonary: Non labored breathing on room air, no wheezing or rales.   Abdominal: Soft. Normal bowel sounds. Non distended and non tender Musculoskeletal: Normal range of motion.     Neurological: Alert and oriented to person, place, and time. Non focal  Skin: Skin is warm and dry.    Data Reviewed:  I have reviewed labs and images.     Latest Ref Rng & Units 11/06/2023    8:39 AM 11/05/2023    4:05 AM 11/04/2023    1:40 PM  BMP  Glucose 70 - 99 mg/dL 811  914  92   BUN 6 - 20 mg/dL 8  9  7    Creatinine 0.44 - 1.00 mg/dL 7.82  9.56  2.13   Sodium 135 - 145 mmol/L 137  138  141   Potassium 3.5 - 5.1 mmol/L 3.5  2.9  2.7  Chloride 98 - 111 mmol/L 101  105  106   CO2 22 - 32 mmol/L 23  21  23    Calcium 8.9 - 10.3 mg/dL 8.3  8.0  8.7       Latest Ref Rng & Units 11/06/2023    8:39 AM 11/05/2023    4:05 AM 11/04/2023    1:40 PM  CBC  WBC 4.0 - 10.5 K/uL 4.6  4.0  5.6   Hemoglobin 12.0 - 15.0 g/dL 16.1  09.6  04.5   Hematocrit 36.0 - 46.0 % 34.3  31.5  34.1   Platelets 150 - 400 K/uL 197  202  241      Family Communication: None  Disposition: Status is: Inpatient  The patient will require care spanning > 2 midnights and should be moved to inpatient because: she had worsening symptoms this morning, dyspnea and malaise leading   Planned Discharge Destination: Home    Time spent: 35 minutes  Author: Marolyn Haller, MD 11/06/2023 11:15 AM  For on  call review [email or web address].

## 2023-11-06 NOTE — Plan of Care (Signed)

## 2023-11-06 NOTE — TOC CM/SW Note (Signed)
Transition of Care [facility]) - Inpatient Brief Assessment   Patient Details  Name: Diane [name] MRN: [id number] Date of Birth: May 15, 1981  Transition of Care [facility]) CM/SW Contact:    Tom-Johnson, Hershal Coria, RN Phone Number: 11/06/2023, 3:26 PM   Clinical Narrative:  Patient presented to the ED with Headache, Chest Pain and Shortness of Breath. Admitted with Influenza A. Currently on Room Air. Patient has hx of Rt ICA Stenosis, s/p Stent placement on 10/16/2023.   Patient noted to have possible small Thrombus distal to Rt ICA Stent.  Continues on ASA and Plavix and Tamiflu.   From home with daughter, has three children. Mother and three siblings supportive. Employed, independent with care and drive self. Has a walker at home. PCP is  Paseda, Baird Kay, FNP And uses [facility] on [address]   No TOC needs or recommendations noted at this time.  Patient not Medically ready for discharge.  CM will continue to follow as patient progresses with care towards discharge.            Transition of Care Asessment: Insurance and Status: Insurance coverage has been reviewed Patient has primary care physician: Yes Home environment has been reviewed: Yes Prior level of function:: Independent Prior/Current Home Services: No current home services Social Drivers of Health Review: SDOH reviewed no interventions necessary Readmission risk has been reviewed: Yes Transition of care needs: no transition of care needs at this time

## 2023-11-06 NOTE — Discharge Instructions (Signed)
Use of face masks and hand hygiene may reduce household transmission. Individuals with i?flue?z? should remain home from work, school, and other public places until at least 24 hours after resolution of fever (without use of antipyretics) and improvement in symptoms.

## 2023-11-07 ENCOUNTER — Observation Stay (HOSPITAL_COMMUNITY): Payer: BC Managed Care – PPO

## 2023-11-07 DIAGNOSIS — J101 Influenza due to other identified influenza virus with other respiratory manifestations: Secondary | ICD-10-CM | POA: Diagnosis not present

## 2023-11-07 DIAGNOSIS — I3139 Other pericardial effusion (noninflammatory): Secondary | ICD-10-CM | POA: Diagnosis not present

## 2023-11-07 LAB — BASIC METABOLIC PANEL
Anion gap: 7 (ref 5–15)
BUN: 6 mg/dL (ref 6–20)
CO2: 24 mmol/L (ref 22–32)
Calcium: 8.4 mg/dL — ABNORMAL LOW (ref 8.9–10.3)
Chloride: 105 mmol/L (ref 98–111)
Creatinine, Ser: 0.77 mg/dL (ref 0.44–1.00)
GFR, Estimated: >60 mL/min (ref >60)
Glucose, Bld: 117 mg/dL — ABNORMAL HIGH (ref 70–99)
Potassium: 3.3 mmol/L — ABNORMAL LOW (ref 3.5–5.1)
Sodium: 136 mmol/L (ref 135–145)

## 2023-11-07 LAB — CBC WITH DIFFERENTIAL/PLATELET
Abs Immature Granulocytes: 0.02 10*3/uL (ref 0.00–0.07)
Basophils Absolute: 0 10*3/uL (ref 0.0–0.1)
Basophils Relative: 0 %
Eosinophils Absolute: 0.2 10*3/uL (ref 0.0–0.5)
Eosinophils Relative: 4 %
HCT: 35.4 % — ABNORMAL LOW (ref 36.0–46.0)
Hemoglobin: 12 g/dL (ref 12.0–15.0)
Immature Granulocytes: 0 %
Lymphocytes Relative: 17 %
Lymphs Abs: 0.8 10*3/uL (ref 0.7–4.0)
MCH: 29.3 pg (ref 26.0–34.0)
MCHC: 33.9 g/dL (ref 30.0–36.0)
MCV: 86.3 fL (ref 80.0–100.0)
Monocytes Absolute: 0.3 10*3/uL (ref 0.1–1.0)
Monocytes Relative: 6 %
Neutro Abs: 3.5 10*3/uL (ref 1.7–7.7)
Neutrophils Relative %: 73 %
Platelets: 222 10*3/uL (ref 150–400)
RBC: 4.1 MIL/uL (ref 3.87–5.11)
RDW: 13.6 % (ref 11.5–15.5)
WBC: 4.9 10*3/uL (ref 4.0–10.5)
nRBC: 0 % (ref 0.0–0.2)

## 2023-11-07 LAB — ECHOCARDIOGRAM LIMITED
AR max vel: 1.65 cm2
AV Area VTI: 1.78 cm2
AV Area mean vel: 1.52 cm2
AV Mean grad: 7 mm[Hg]
AV Peak grad: 12.8 mm[Hg]
Ao pk vel: 1.79 m/s
Calc EF: 62.4 %
S' Lateral: 3.1 cm
Single Plane A2C EF: 68.8 %
Single Plane A4C EF: 54.8 %

## 2023-11-07 LAB — MAGNESIUM: Magnesium: 1.6 mg/dL — ABNORMAL LOW (ref 1.7–2.4)

## 2023-11-07 LAB — PROCALCITONIN: Procalcitonin: <0.1 ng/mL

## 2023-11-07 MED ORDER — POTASSIUM CHLORIDE 20 MEQ PO PACK
40.0000 meq | PACK | Freq: Once | ORAL | Status: DC
  Filled 2023-11-07: qty 2

## 2023-11-07 MED ORDER — FERROUS SULFATE 325 (65 FE) MG PO TABS: 325.0000 mg | ORAL_TABLET | Freq: Every day | ORAL | 0 refills | Status: DC

## 2023-11-07 MED ORDER — DM-GUAIFENESIN ER 30-600 MG PO TB12: 1.0000 | ORAL_TABLET | Freq: Two times a day (BID) | ORAL | Status: AC

## 2023-11-07 MED ORDER — HYDROCOD POLI-CHLORPHE POLI ER 10-8 MG/5ML PO SUER: 5.0000 mL | Freq: Two times a day (BID) | ORAL | 0 refills | Status: AC | PRN

## 2023-11-07 MED ORDER — BENZONATATE 200 MG PO CAPS
200.0000 mg | ORAL_CAPSULE | Freq: Three times a day (TID) | ORAL | 0 refills | Status: DC
Start: 2023-11-07 — End: 2023-11-25

## 2023-11-07 MED ORDER — POTASSIUM CHLORIDE CRYS ER 20 MEQ PO TBCR
40.0000 meq | EXTENDED_RELEASE_TABLET | Freq: Once | ORAL | Status: AC
  Administered 2023-11-07: 40 meq via ORAL
  Filled 2023-11-07: qty 2

## 2023-11-07 MED ORDER — OSELTAMIVIR PHOSPHATE 75 MG PO CAPS: 75.0000 mg | ORAL_CAPSULE | Freq: Two times a day (BID) | ORAL | 0 refills | Status: AC

## 2023-11-07 MED ORDER — FERROUS SULFATE 325 (65 FE) MG PO TABS
325.0000 mg | ORAL_TABLET | Freq: Every day | ORAL | Status: DC
  Administered 2023-11-07: 325 mg via ORAL
  Filled 2023-11-07: qty 1

## 2023-11-07 MED ORDER — POLYETHYLENE GLYCOL 3350 17 G PO PACK: 17.0000 g | PACK | Freq: Every day | ORAL | Status: AC

## 2023-11-07 MED ORDER — MAGNESIUM SULFATE 2 GM/50ML IV SOLN
2.0000 g | Freq: Once | INTRAVENOUS | Status: AC
  Administered 2023-11-07: 2 g via INTRAVENOUS
  Filled 2023-11-07: qty 50

## 2023-11-07 NOTE — TOC Transition Note (Signed)
Transition of Care [facility]) - Discharge Note   Patient Details  Name: Diane [name] MRN: [id number] Date of Birth: 1981-08-07  Transition of Care [facility]) CM/SW Contact:  Tom-Johnson, Hershal Coria, RN Phone Number: 11/07/2023, 1:19 PM   Clinical Narrative:     Patient is scheduled for discharge today.  Outpatient referral, hospital f/u and discharge instructions on AVS. No TOC needs or recommendations noted. Son, [place] to transport at discharge.  No further TOC needs noted.        Final next level of care: Home/Self Care Barriers to Discharge: Barriers Resolved   Patient Goals and CMS Choice Patient states their goals for this hospitalization and ongoing recovery are:: To return home CMS Medicare.gov Compare Post Acute Care list provided to:: Patient Choice offered to / list presented to : NA      Discharge Placement                Patient to be transferred to facility by: Son Name of family member notified: [place]    Discharge Plan and Services Additional resources added to the After Visit Summary for                  DME Arranged: N/A DME Agency: NA       HH Arranged: NA HH Agency: NA        Social Drivers of Health (SDOH) Interventions SDOH Screenings   Food Insecurity: No Food Insecurity (11/05/2023)  Housing: Low Risk  (11/05/2023)  Transportation Needs: No Transportation Needs (11/05/2023)  Utilities: Not At Risk (11/05/2023)  Depression (PHQ2-9): Low Risk  (10/14/2023)  Tobacco Use: Low Risk  (11/04/2023)     Readmission Risk Interventions     No data to display

## 2023-11-07 NOTE — Progress Notes (Signed)
Discharge order received. AVS printed and given to patient. IV and telemetry removed. Patient transported home by family member.

## 2023-11-09 NOTE — Discharge Summary (Signed)
Physician Discharge Summary   Patient: Diane [name] MRN: [id number] DOB: 1981/04/01  Admit date:     11/04/2023  Discharge date: 11/07/2023  Discharge Physician: Marolyn Haller   PCP: Donell Beers, FNP   Recommendations at discharge:    Ensure Sinus tachycardia resolution after illness improved  Iron panel in 2-3 months  Check magnesium and potassium, may require further work up for losses.   Discharge Diagnoses: Principal Problem:   Influenza A Active Problems:   Hypokalemia   Tachycardia   Hypomagnesemia  Resolved Problems:   * No resolved hospital problems. [facility] Course: By problem below   Assessment and Plan: #Headache H/o migraines Patient with history of migraines. Has had variable headache from dull aching bitemporal headache and L temporal headaches throughout hospitalization. Her headaches improved with APAP and headache cocktail. She does not feel these current headaches are migranous in nature. They are likely a result of her influenza infection.   Patient encouraged to maintain adequate hydration and continue tylenol as needed. Given she has variable headaches that are poorly controlled she will schedule an appointment with [facility].    #Influenza A infection Patient had worse symptoms on HD number 2. She had general malaise and cough. She remained afebrile on RA. CXR with retrocardiac opaicty. She ha no leukocytosis and procalcitonin was negative making superimposed bacterial infection less likely. She was continued on tamiflu and given cough suppressant.    #Sinus tachycardia  Patient with sinus tachycardia persistently during hospitalization, up to 120s with no activity. Unclear exact etiology possibly in the setting of her influenza infection. Possible component of anxiety as well. Patient states she sometimes has palpitations. Discussed with patient if she continues to have palpitations as her acute illness resolves she can undergo further work up  for this. Her TTE was without any valvulopathy or RWMA. Suspect this will improve once she is further from this acute event.   #Mild normocytic anemia  Stable. Iron/TIBC/Ferritin/ %Sat Labs (Brief)          Component Value Date/Time    IRON 24 (L) 11/06/2023 0839    IRON 42 01/21/2018 1053    TIBC 328 11/06/2023 0839    TIBC 414 01/21/2018 1053    FERRITIN 34 11/06/2023 0839    FERRITIN 11 (L) 01/21/2018 1053    IRONPCTSAT 7 (L) 11/06/2023 0839    IRONPCTSAT 10 (L) 01/21/2018 1053      Patient noted to have iron deficiency given 200mg  of  Iv iron and started on PO supplementation. This is likely a result of menstruation.    #MSK Chest pain Resolved. Likely MSK related. Mycoarditis less likely given normal troponin level.   Patient did mention that she has some chest discomfort with exertion that has been ongoing for some time. She has no evidence of acute ischemia on her EKG or of an old infarct. She underwent a TTE to further evaluate a pericardial effusion seen on CT that showed a small pericardial effusion but no WMA and normal EF. Doubt patients symptoms  are cardiac in nature, deferred further work up for this to the outpatient setting.    #HTN Improved with resumption of her home medication.      #Long standing Hypokalemia and Hypomagnesemia  Has had extensive work up in the outpatient  per patient. She has no diarrhea or vomiting.. Unclear etiology. Patient was given magnesium and potassium prior to discharge. W     #R ICA stenosis s/p stenting  Patient noted  to have possible small thrombus distal to R ICA stent. Neurology and neuro IR did not feel this was contributing to her headache. They recommended patient continue ASA and plavix. .       Pain control - [place] Controlled Substance Reporting System database was reviewed. and patient was instructed, not to drive, operate heavy machinery, perform activities at heights, swimming or participation in water  activities or provide baby-sitting services while on Pain, Sleep and Anxiety Medications; until their outpatient Physician has advised to do so again. Also recommended to not to take more than prescribed Pain, Sleep and Anxiety Medications.  Consultants: none Procedures performed: none  Disposition: Home Diet recommendation:  Regular diet DISCHARGE MEDICATION: Allergies as of 11/07/2023       Reactions   Almond (diagnostic) Anaphylaxis, Itching, Other (See Comments)   Throat feels like its closing   Chlorhexidine Itching   Patient also had mild redness and hives in one location.  Recommended to pre medicate with Benadryl if needed.        Medication List     STOP taking these medications    amphetamine-dextroamphetamine 30 MG tablet Commonly known as: ADDERALL   ELDERBERRY PO       TAKE these medications    acetaminophen 500 MG tablet Commonly known as: TYLENOL Take 1,000 mg by mouth every 6 (six) hours as needed for mild pain (pain score 1-3) or headache.   ALPRAZolam 0.5 MG tablet Commonly known as: XANAX Take 0.5 mg by mouth at bedtime.   amLODipine 5 MG tablet Commonly known as: NORVASC Take 1 tablet (5 mg total) by mouth daily.   aspirin 81 MG chewable tablet Chew 1 tablet (81 mg total) by mouth daily.   atorvastatin 20 MG tablet Commonly known as: Lipitor Take 1 tablet (20 mg total) by mouth daily. What changed: when to take this   benzonatate 200 MG capsule Commonly known as: TESSALON Take 1 capsule (200 mg total) by mouth 3 (three) times daily.   cetirizine 10 MG tablet Commonly known as: ZyrTEC Allergy Take 1 tablet (10 mg total) by mouth daily. What changed: when to take this   chlorpheniramine-HYDROcodone 10-8 MG/5ML Commonly known as: TUSSIONEX Take 5 mLs by mouth every 12 (twelve) hours as needed for up to 7 days for cough.   clopidogrel 75 MG tablet Commonly known as: Plavix Take 1 tablet (75 mg total) by mouth daily.    dextromethorphan-guaiFENesin 30-600 MG 12hr tablet Commonly known as: MUCINEX DM Take 1 tablet by mouth 2 (two) times daily for 7 days.   ferrous sulfate 325 (65 FE) MG tablet Take 1 tablet (325 mg total) by mouth daily with breakfast.   ondansetron 4 MG disintegrating tablet Commonly known as: ZOFRAN-ODT Take 1 tablet (4 mg total) by mouth every 8 (eight) hours as needed for nausea or vomiting.   oseltamivir 75 MG capsule Commonly known as: TAMIFLU Take 1 capsule (75 mg total) by mouth 2 (two) times daily for 8 doses.   polyethylene glycol 17 g packet Commonly known as: MIRALAX / GLYCOLAX Take 17 g by mouth daily.   Saline 0.65 % Soln Place 1 spray into the nose daily.        Discharge Exam: There were no vitals filed for this visit.  Constitutional: In no distress.  Cardiovascular: Normal rate, regular rhythm. No lower extremity edema  Pulmonary: Non labored breathing on room air, no wheezing or rales. Abdominal: Soft. Normal bowel sounds. Non distended and non tender  Musculoskeletal: Normal range of motion.     Neurological: Alert and oriented to person, place, and time. Non focal  Skin: Skin is warm and dry.    Condition at discharge: good  The results of significant diagnostics from this hospitalization (including imaging, microbiology, ancillary and laboratory) are listed below for reference.   Imaging Studies: ECHOCARDIOGRAM LIMITED Result Date: 11/07/2023    ECHOCARDIOGRAM LIMITED REPORT   Patient Name:   Diane [name] Date of Exam: 11/07/2023 Medical Rec #:  [id number]       Height:       68.0 in Accession #:    [id number]      Weight:       185.0 lb Date of Birth:  [birth year]        BSA:          1.977 m Patient Age:    42 years        BP:           127/79 mmHg Patient Gender: F               HR:           119 bpm. Exam Location:  Inpatient Procedure: Limited Echo, Cardiac Doppler and Limited Color Doppler (Both            Spectral and Color Flow Doppler were  utilized during procedure). Indications:    Pericardial Effusion  History:        Patient has no prior history of Echocardiogram examinations.                 Carotid Disease; Risk Factors:Hypertension.  Sonographer:    Amy Chionchio Referring Phys: [id number] Greyden Besecker IMPRESSIONS  1. Left ventricular ejection fraction, by estimation, is 60 to 65%. The left ventricle has normal function.  2. Right ventricular systolic function is normal. The right ventricular size is normal.  3. A small pericardial effusion is present.  4. The mitral valve is grossly normal. Trivial mitral valve regurgitation.  5. The aortic valve is grossly normal.  6. The inferior vena cava is normal in size with greater than 50% respiratory variability, suggesting right atrial pressure of 3 mmHg. FINDINGS  Left Ventricle: Left ventricular ejection fraction, by estimation, is 60 to 65%. The left ventricle has normal function. There is no left ventricular hypertrophy. Right Ventricle: The right ventricular size is normal. Right ventricular systolic function is normal. Left Atrium: Left atrial size was normal in size. Right Atrium: Right atrial size was normal in size. Pericardium: A small pericardial effusion is present. Mitral Valve: The mitral valve is grossly normal. Trivial mitral valve regurgitation. Tricuspid Valve: The tricuspid valve is normal in structure. Tricuspid valve regurgitation is trivial. Aortic Valve: The aortic valve is grossly normal. Aortic valve mean gradient measures 7.0 mmHg. Aortic valve peak gradient measures 12.8 mmHg. Aortic valve area, by VTI measures 1.78 cm. Pulmonic Valve: The pulmonic valve was not well visualized. Aorta: The aortic root is normal in size and structure. Venous: The inferior vena cava is normal in size with greater than 50% respiratory variability, suggesting right atrial pressure of 3 mmHg. LEFT VENTRICLE PLAX 2D LVIDd:         4.20 cm LVIDs:         3.10 cm LV PW:         1.10 cm LV IVS:         1.20 cm LVOT diam:     2.00 cm LV SV:  37 LV SV Index:   19 LVOT Area:     3.14 cm  LV Volumes (MOD) LV vol d, MOD A2C: 83.4 ml LV vol d, MOD A4C: 98.1 ml LV vol s, MOD A2C: 26.0 ml LV vol s, MOD A4C: 44.3 ml LV SV MOD A2C:     57.4 ml LV SV MOD A4C:     98.1 ml LV SV MOD BP:      58.8 ml RIGHT VENTRICLE          IVC RV Basal diam:  3.60 cm  IVC diam: 1.40 cm TAPSE (M-mode): 1.9 cm LEFT ATRIUM             Index        RIGHT ATRIUM           Index LA Vol (A2C):   63.7 ml 32.22 ml/m  RA Area:     11.70 cm LA Vol (A4C):   29.8 ml 15.07 ml/m  RA Volume:   25.90 ml  13.10 ml/m LA Biplane Vol: 47.2 ml 23.87 ml/m  AORTIC VALVE AV Area (Vmax):    1.65 cm AV Area (Vmean):   1.52 cm AV Area (VTI):     1.78 cm AV Vmax:           179.00 cm/s AV Vmean:          122.000 cm/s AV VTI:            0.210 m AV Peak Grad:      12.8 mmHg AV Mean Grad:      7.0 mmHg LVOT Vmax:         94.00 cm/s LVOT Vmean:        59.100 cm/s LVOT VTI:          0.119 m LVOT/AV VTI ratio: 0.57  AORTA Ao Root diam: 3.00 cm Ao Asc diam:  3.10 cm TRICUSPID VALVE TR Peak grad:   22.7 mmHg TR Vmax:        238.00 cm/s  SHUNTS Systemic VTI:  0.12 m Systemic Diam: 2.00 cm Aditya Sabharwal Electronically signed by Dorthula Nettles Signature Date/Time: 11/07/2023/9:45:13 AM    Final    DG CHEST PORT 1 VIEW Result Date: 11/06/2023 CLINICAL DATA:  Shortness of breath EXAM: PORTABLE CHEST 1 VIEW COMPARISON:  11/04/2023 x-ray and CT FINDINGS: Normal cardiopericardial silhouette. No pneumothorax, edema. There is increasing left retrocardiac opacity. No significant effusion. IMPRESSION: Increasing left retrocardiac opacity. Electronically Signed   By: Karen Kays M.D.   On: 11/06/2023 15:40   CT Angio Chest PE W and/or Wo Contrast Result Date: 11/04/2023 CLINICAL DATA:  Chest pain and shortness of breath. EXAM: CT ANGIOGRAPHY CHEST WITH CONTRAST TECHNIQUE: Multidetector CT imaging of the chest was performed using the standard protocol during bolus  administration of intravenous contrast. Multiplanar CT image reconstructions and MIPs were obtained to evaluate the vascular anatomy. RADIATION DOSE REDUCTION: This exam was performed according to the departmental dose-optimization program which includes automated exposure control, adjustment of the mA and/or kV according to patient size and/or use of iterative reconstruction technique. CONTRAST:  75mL OMNIPAQUE IOHEXOL 350 MG/ML SOLN COMPARISON:  Chest radiographs obtained earlier today and on 09/14/2023. Chest CTA dated 01/06/2022. FINDINGS: Cardiovascular: Normally opacified pulmonary arteries with no pulmonary arterial filling defects seen. Mildly enlarged heart. Small pericardial effusion. Mediastinum/Nodes: Mildly enlarged bilateral hilar and subcarinal lymph nodes. These include a right hilar lymph node with a short axis diameter of 12 mm on image number 65/5, left  hilar node with a short axis diameter of 13 mm on image number 67/5 and subcarinal node with a short axis diameter of 12 mm on image number 75/5. Unremarkable trachea and esophagus. Small hiatal hernia. Lungs/Pleura: Minimal right basilar dependent atelectasis. Minimal right pleural effusion. Clear left lung. Stable small, normal appearing perifissural lymph node on the left. Upper Abdomen: Unremarkable. Musculoskeletal: Lower cervical spine degenerative changes. Review of the MIP images confirms the above findings. IMPRESSION: 1. No evidence of pulmonary emboli. 2. Minimal right basilar dependent atelectasis. 3. Minimal right pleural effusion. 4. Mild cardiomegaly and small pericardial effusion. 5. Mildly enlarged bilateral hilar and subcarinal lymph nodes, nonspecific and most likely reactive. 6. Small hiatal hernia. Electronically Signed   By: Beckie Salts M.D.   On: 11/04/2023 18:03   CT ANGIO HEAD NECK W WO CM Result Date: 11/04/2023 CLINICAL DATA:  Headache, sudden, severe. Status post angioplasty and stenting of the intracranial right ICA  last month. EXAM: CT ANGIOGRAPHY HEAD AND NECK WITH AND WITHOUT CONTRAST TECHNIQUE: Multidetector CT imaging of the head and neck was performed using the standard protocol during bolus administration of intravenous contrast. Multiplanar CT image reconstructions and MIPs were obtained to evaluate the vascular anatomy. Carotid stenosis measurements (when applicable) are obtained utilizing NASCET criteria, using the distal internal carotid diameter as the denominator. RADIATION DOSE REDUCTION: This exam was performed according to the departmental dose-optimization program which includes automated exposure control, adjustment of the mA and/or kV according to patient size and/or use of iterative reconstruction technique. CONTRAST:  75mL OMNIPAQUE IOHEXOL 350 MG/ML SOLN COMPARISON:  CTA head and neck 09/13/2023 FINDINGS: CTA NECK FINDINGS Aortic arch: Standard branching with widely patent arch vessel origins. Right carotid system: Patent without evidence of stenosis or dissection. Left carotid system: Patent without evidence of stenosis or dissection. Vertebral arteries: Patent without evidence of stenosis or dissection. Strongly dominant left vertebral artery. Skeleton: Focally prominent disc degeneration at C5-6. Other neck: No evidence of cervical lymphadenopathy or mass. Upper chest: Clear lung apices. Review of the MIP images confirms the above findings CTA HEAD FINDINGS Anterior circulation: The intracranial left ICA is widely patent. A stent is present in the distal right petrous ICA and appears patent. Distal to the stent, the right cavernous ICA is patent but appears diffusely irregular and moderately to severely narrowed with possible filling defect, and there is a new discrete filling defect measuring 3 mm in length in the right supraclinoid ICA. ACAs and MCAs are patent without evidence of a proximal branch occlusion or significant proximal stenosis. No aneurysm is identified. Posterior circulation: The  intracranial vertebral arteries are widely patent to the basilar. Patent PICA and SCA origins are visualized bilaterally. The basilar artery is widely patent. There are patent posterior communicating arteries bilaterally. Both PCAs are patent without evidence of a significant proximal stenosis. No aneurysm is identified. Venous sinuses: Patent. Anatomic variants: Hypoplastic right A1 segment. Review of the MIP images confirms the above findings IMPRESSION: 1. New 3 mm filling defect/possible thrombus in the right supraclinoid ICA. Diffuse irregularity and moderate to severe narrowing of the right cavernous ICA distal to the stent with possible thrombus in this region as well. 2. Widely patent cervical carotid and vertebral arteries. Electronically Signed   By: Sebastian Ache M.D.   On: 11/04/2023 17:29   CT Head Wo Contrast Result Date: 11/04/2023 CLINICAL DATA:  Sudden severe headache. Stenosis with stent placement. EXAM: CT HEAD WITHOUT CONTRAST TECHNIQUE: Contiguous axial images were obtained from the base  of the skull through the vertex without intravenous contrast. RADIATION DOSE REDUCTION: This exam was performed according to the departmental dose-optimization program which includes automated exposure control, adjustment of the mA and/or kV according to patient size and/or use of iterative reconstruction technique. COMPARISON:  09/13/2023 FINDINGS: Brain: There is regional artifact presumably due to material external to the patient. Allowing for that, the brain has normal appearance without evidence of hemorrhage, stroke, mass, hydrocephalus or extra-axial collection. Right carotid stent noted at the carotid canal region. Vascular: Right carotid stent as above Skull: Normal Sinuses/Orbits: Inflammatory changes of the paranasal sinuses on the left including an air-fluid level in the left sphenoid sinus. This could relate to headache. Other: None IMPRESSION: 1. No acute intracranial finding. Right carotid stent  noted at the carotid canal region. 2. Inflammatory changes of the paranasal sinuses on the left including an air-fluid level in the left sphenoid sinus. This could relate to headache. Electronically Signed   By: Paulina Fusi M.D.   On: 11/04/2023 16:49   DG Chest 2 View Result Date: 11/04/2023 CLINICAL DATA:  Chest pain. EXAM: CHEST - 2 VIEW COMPARISON:  09/14/2023. FINDINGS: Bilateral lung fields are clear. Bilateral costophrenic angles are clear. Note is made of elevated right hemidiaphragm. Normal cardio-mediastinal silhouette. No acute osseous abnormalities. The soft tissues are within normal limits. IMPRESSION: No active cardiopulmonary disease. Electronically Signed   By: Jules Schick M.D.   On: 11/04/2023 15:36   IR Radiologist Eval & Mgmt Result Date: 10/31/2023 EXAM: ESTABLISHED PATIENT OFFICE VISIT CHIEF COMPLAINT: Follow-up visit. Current Pain Level: 1-10 HISTORY OF PRESENT ILLNESS: The patient is a 43 year old lady who underwent an uneventful endovascular revascularization of tandem severe stenosis of the right internal carotid artery intracranially with stent assisted angioplasty on 10/16/2023. The patient had no periprocedural, or immediate 24 hour complications. She was then discharged home under the care of her family. The patient returns today for a 2 week follow-up. Clinically, the patient reports minimal 4/10 occasional left temporal frontal mild headaches which respond to Tylenol. These headaches are not associated with any visual symptoms, photophobia, nausea, vomiting, left-sided paresthesia or weakness, or left side of incoordination or gait abnormalities. No history of facial droop or of speech aberrations. The patient appears to have returned to her preprocedural neurological function. Patient reports no chest pain, shortness of breath or palpitations or pedal edema. She reports no difficulty with breathing, wheezing, or of cough with sputum production. Her appetite is normal.  Weight  is steady. Denies any abdominal pain, constipation, diarrhea or melena. No dysuria, hematuria or polyuria. No recent chills, fever or rigors. Past Medical History: Unchanged. Medications: ALPRAZolam 0.5 MG tablet commonly known as: XANAX take 0.5 mg by mouth at bedtime. amLODipine 5 MG tablet commonly known as: NORVASC take 1 tablet (5 mg total) by mouth daily. amphetamine-dextroamphetamine 30 MG tablet commonly known as: ADDERALL take 30 mg by mouth daily. aspirin 81 MG chewable tablet chew 1 tablet (81 mg total) by mouth daily. atorvastatin 20 MG tablet commonly known as: Lipitor take 1 tablet (20 mg total) by mouth daily. cetirizine 10 MG tablet commonly known as: ZyrTEC Allergy take 1 tablet (10 mg total) by mouth daily. clopidogrel 75 MG tablet commonly known as: Plavix take 1 tablet (75 mg total) by mouth daily. ELDERBERRY PO take 2 tablets by mouth daily at 2 am. Gummy gabapentin 100 MG capsule commonly known as: Neurontin take 1 capsule (100 mg total) by mouth 3 (three) times daily. lidocaine 5 %  commonly known as: Lidoderm place 1 patch onto the skin daily. Remove & Discard patch within 12 hours or as directed by MD ondansetron 4 MG disintegrating tablet commonly known as: ZOFRAN-ODT take 1 tablet (4 mg total) by mouth every 8 (eight) hours as needed for nausea or vomiting. predniSONE 10 MG tablet commonly known as: DELTASONE take 5 tablets (50 mg total) by mouth daily for 3 days, THEN 4 tablets (40 mg total) daily for 3 days, THEN 3 tablets (30 mg total) daily for 3 days, THEN 2 tablets (20 mg total) daily for 3 days, THEN 1 tablet (10 mg total) daily for 3 days, THEN 0.5 tablets (5 mg total) daily for 3 days. Start taking on: October 17, 2023 what changed: See the new instructions. Saline 0.65 % Soln place 1 spray into the nose daily. Allergies: Unchanged. Social History: Patient denies using tobacco, or illicit chemicals. Family History:  No change. REVIEW OF SYSTEMS: Negative unless as mentioned above.  PHYSICAL EXAMINATION: Alert, awake, oriented to time, place, space. Appears in no acute distress. Speech and comprehension intact. Normal eye contact. No gross abnormal lateralizing neurological features evident. Station and gait normal. ASSESSMENT AND PLAN: Patient advised to continue taking her dual antiplatelets for at least 6 months. CT angiogram of the head and neck in June 2025. The patient also advised to maintain adequate hydration. Patient feels physically fit to resume her work. Patient may return to her work as tolerated. Patient also advised to ensure keeping her March 2025 appointment with her primary doctor. Questions were answered to her satisfaction. The patient leaves with good understanding and agreement with the above management plan. Electronically Signed   By: Julieanne Cotton M.D.   On: 10/31/2023 07:59   IR Intra Cran Stent Result Date: 10/18/2023 CLINICAL DATA:  Symptomatic severe high-grade tandem stenosis of the right internal carotid artery intracranially. EXAM: INTRACRANIAL STENT (INCL PTA) COMPARISON:  Diagnostic arteriogram of September 30, 2023. MEDICATIONS: Heparin 4,000 units IV. No antibiotic was administered within 1 hour of the procedure. ANESTHESIA/SEDATION: General anesthesia. CONTRAST:  Omnipaque 300 approximately 80 mL. FLUOROSCOPY TIME:  Fluoroscopy Time: 33 minutes 12 seconds (2580 mGy). COMPLICATIONS: None immediate. TECHNIQUE: Informed written consent was obtained from the patient after a thorough discussion of the procedural risks, benefits and alternatives. All questions were addressed. Maximal Sterile Barrier Technique was utilized including caps, mask, sterile gowns, sterile gloves, sterile drape, hand hygiene and skin antiseptic. A timeout was performed prior to the initiation of the procedure. The right groin was prepped and draped in the usual sterile fashion. Thereafter using modified Seldinger technique, transfemoral access into the right common femoral artery  was obtained without difficulty. Over an 0.035 inch guidewire, an 8 French 25 cm Pinnacle sheath was inserted. Through this, and also over an 0.035 inch guidewire, a 5 [place] JB 1 catheter was advanced to the aortic arch region and selectively positioned in the right common carotid artery. FINDINGS: The right common carotid arteriogram demonstrates the right external carotid artery and its major branches to be widely patent. The right internal carotid artery demonstrates a mildly decreased caliber in its proximal 2/3 with moderately slow ascent of contrast to the cranial skull base. Again demonstrated is the high-grade severe stenosis of the distal horizontal petrous segment, and of the proximal cavernous segment. More distally, the supraclinoid right ICA is patent with evidence of unopacified blood from the posterior circulation opacifying the right middle cerebral artery distribution. ENDOVASCULAR REVASCULARIZATION OF TANDEM HIGH-GRADE STENOSIS OF THE RIGHT  INTERNAL CAROTID ARTERY INTRACRANIALLY The diagnostic catheter in the distal right common carotid artery was then exchanged over an 0.035 inch 300 cm Rosen exchange guidewire for an 80 cm 087 Neuron Max sheath, which was advanced into the proximal 1/3 of the right internal carotid artery. The guidewire was removed. Good aspiration was obtained from the hub of the Neuron Max sheath. A gentle control arteriogram performed through this demonstrated no evidence of spasms, dissections or of intraluminal filling defects. Through this, a 2.5 mm x 9 mm Gateway balloon angioplasty microcatheter which had been prepped with 50% contrast and 50% heparinized saline infusion was then advanced over an 014 inch 200 cm standard micro guidewire with a moderate J configuration to the proximal horizontal petrous segment. Using a torque device, the micro guidewire was then advanced without difficulty through the two areas of severe stenosis, one in the distal horizontal petrous  segment and one in the proximal cavernous segment followed by balloon angioplasties performed at both the sides from proximal to distal. At the more proximal severe stenosis, a 2.5 mm x 9 mm Gateway balloon was inflated to approximately 2.4 mm at the proximal stenosis using micro inflation syringe device via micro tubing for approximately a minute and a half. The balloon was then deflated and retrieved proximally. A control arteriogram performed through the sheath demonstrated significantly improved caliber and flow through the angioplastied segment. The balloon was then advanced to cover the more distal proximal cavernous stenosis. Using a micro inflation syringe device via micro tubing, a 2.5 mm x 9 mm Gateway balloon angioplasty catheter was advanced to the more distal stenosis where it was inflated again using micro inflation syringe device via micro tubing to approximately 2.46 mm where it was maintained for a minute. Following deflation, a control arteriogram performed through the sheath demonstrated significantly improved caliber and flow through the entirety of the right internal carotid artery distally and with improved hemodynamic flow into the right middle cerebral artery distribution. An 014 inch 300 cm Zoom exchange micro guidewire with a moderate J configuration was then advanced through the 2.5 mm x 9 mm Gateway balloon catheter in the supraclinoid right ICA followed by advancement of a 125 cm 055 Apro intermediate catheter which was advanced and positioned in the supraclinoid right ICA. The exchange wire was then advanced to the inferior division of the right MCA in the M2 segment. Over this, a 2.75 mm x 12 mm Onyx Frontier balloon mounted stent delivery apparatus which had been prepped with 50% contrast and 50% heparinized saline infusion, and retrogradely infused with a heparinized saline infusion was advanced using the rapid exchange technique and positioned just to the proximal and distal markers  and were adequate distance from the site of the angioplastied proximal stenosis in the distal petrous ICA. The Apro intermediate catheter was then retrieved to unmask Frontier stent. The stent was then deployed by inflating the balloon using micro inflation syringe device via micro tubing to approximately 2.6 mm where it was maintained for approximately 30 seconds. The balloon was then deflated and retrieved and removed. Control arteriograms were then performed at 15 minutes, and 30 minutes post stent deployment. These continued to demonstrate excellent apposition of the device and stable appearance of the more distal angioplastied segment. Intracranially brisk flow was seen into the right MCA distribution with mild support from the posterior circulation via the posterior communicating artery. The patient was given a total of 3 mg of Integrilin intra-arterially in order to prevent acute platelet  aggregation at the site of the angioplasty, and within the stent. Patient was also given a total of approximately 3 aliquots of 25 mcg of nitroglycerin intra-arterially. A final control arteriogram performed through the sheath in the distal right common carotid artery after removal of the intermediate catheter, and of the exchange micro guidewire continued to demonstrate excellent flow into the revascularized internal carotid artery intracranially on the right side. No intraluminal filling defects, or occlusions were seen in the right MCA branches. The micro sheath was removed. An 8 French Angio-Seal closure device was deployed for hemostasis at the right groin puncture site. Distal pulses remained palpable in both feet unchanged from prior to the procedure. A flat panel CT of the brain demonstrated no evidence of hemorrhagic complications. The patient's general anesthesia was reversed, and patient was extubated. Upon recovery, the patient complained of a mild bifrontal headache but no nausea or vomiting. Her speech was  clear. She had no lateralizing motor or sensory abnormalities. She was then transferred to the neuro ICU for overnight IV heparinization, and control of her blood pressure with IV cleviprex drip. The following morning, the patient was able to handle liquids and solids. She had a minimal headache. She complained of no nausea or vomiting. The right groin appeared soft with significantly decreased tenderness. No evidence of hematoma. Distal pulses remained present. She was started on aspirin 325 mg a day, and Plavix 75 mg a day. IMPRESSION: Status post balloon angioplasty of high-grade stenosis of the proximal cavernous right ICA, and stent assisted angioplasty of the proximal distal petrous segment intracranial ICA using a 2.75 mm x 12 mm Frontier balloon mounted stent. PLAN: Follow-up in 2 weeks post discharge. Patient advised to continue with aspirin 25 mg a day, and Plavix 75 mg a day in addition to her blood pressure and cholesterol lowering medications. Patient advised to maintain adequate hydration. Patient advised not to drive for a couple of weeks. Instructed to avoid stooping, bending or lifting weights above 10 pounds for 2 weeks. Patient expressed understanding and agreement with the above management plan. She was then discharged under the care of her mother. Electronically Signed   By: Julieanne Cotton M.D.   On: 10/18/2023 08:04   IR ANGIO INTRA EXTRACRAN SEL INTERNAL CAROTID UNI R MOD SED Result Date: 10/18/2023 CLINICAL DATA:  Symptomatic severe high-grade tandem stenosis of the right internal carotid artery intracranially. EXAM: INTRACRANIAL STENT (INCL PTA) COMPARISON:  Diagnostic arteriogram of September 30, 2023. MEDICATIONS: Heparin 4,000 units IV. No antibiotic was administered within 1 hour of the procedure. ANESTHESIA/SEDATION: General anesthesia. CONTRAST:  Omnipaque 300 approximately 80 mL. FLUOROSCOPY TIME:  Fluoroscopy Time: 33 minutes 12 seconds (2580 mGy). COMPLICATIONS: None immediate.  TECHNIQUE: Informed written consent was obtained from the patient after a thorough discussion of the procedural risks, benefits and alternatives. All questions were addressed. Maximal Sterile Barrier Technique was utilized including caps, mask, sterile gowns, sterile gloves, sterile drape, hand hygiene and skin antiseptic. A timeout was performed prior to the initiation of the procedure. The right groin was prepped and draped in the usual sterile fashion. Thereafter using modified Seldinger technique, transfemoral access into the right common femoral artery was obtained without difficulty. Over an 0.035 inch guidewire, an 8 French 25 cm Pinnacle sheath was inserted. Through this, and also over an 0.035 inch guidewire, a 5 [place] JB 1 catheter was advanced to the aortic arch region and selectively positioned in the right common carotid artery. FINDINGS: The right common carotid arteriogram demonstrates  the right external carotid artery and its major branches to be widely patent. The right internal carotid artery demonstrates a mildly decreased caliber in its proximal 2/3 with moderately slow ascent of contrast to the cranial skull base. Again demonstrated is the high-grade severe stenosis of the distal horizontal petrous segment, and of the proximal cavernous segment. More distally, the supraclinoid right ICA is patent with evidence of unopacified blood from the posterior circulation opacifying the right middle cerebral artery distribution. ENDOVASCULAR REVASCULARIZATION OF TANDEM HIGH-GRADE STENOSIS OF THE RIGHT INTERNAL CAROTID ARTERY INTRACRANIALLY The diagnostic catheter in the distal right common carotid artery was then exchanged over an 0.035 inch 300 cm Rosen exchange guidewire for an 80 cm 087 Neuron Max sheath, which was advanced into the proximal 1/3 of the right internal carotid artery. The guidewire was removed. Good aspiration was obtained from the hub of the Neuron Max sheath. A gentle control  arteriogram performed through this demonstrated no evidence of spasms, dissections or of intraluminal filling defects. Through this, a 2.5 mm x 9 mm Gateway balloon angioplasty microcatheter which had been prepped with 50% contrast and 50% heparinized saline infusion was then advanced over an 014 inch 200 cm standard micro guidewire with a moderate J configuration to the proximal horizontal petrous segment. Using a torque device, the micro guidewire was then advanced without difficulty through the two areas of severe stenosis, one in the distal horizontal petrous segment and one in the proximal cavernous segment followed by balloon angioplasties performed at both the sides from proximal to distal. At the more proximal severe stenosis, a 2.5 mm x 9 mm Gateway balloon was inflated to approximately 2.4 mm at the proximal stenosis using micro inflation syringe device via micro tubing for approximately a minute and a half. The balloon was then deflated and retrieved proximally. A control arteriogram performed through the sheath demonstrated significantly improved caliber and flow through the angioplastied segment. The balloon was then advanced to cover the more distal proximal cavernous stenosis. Using a micro inflation syringe device via micro tubing, a 2.5 mm x 9 mm Gateway balloon angioplasty catheter was advanced to the more distal stenosis where it was inflated again using micro inflation syringe device via micro tubing to approximately 2.46 mm where it was maintained for a minute. Following deflation, a control arteriogram performed through the sheath demonstrated significantly improved caliber and flow through the entirety of the right internal carotid artery distally and with improved hemodynamic flow into the right middle cerebral artery distribution. An 014 inch 300 cm Zoom exchange micro guidewire with a moderate J configuration was then advanced through the 2.5 mm x 9 mm Gateway balloon catheter in the  supraclinoid right ICA followed by advancement of a 125 cm 055 Apro intermediate catheter which was advanced and positioned in the supraclinoid right ICA. The exchange wire was then advanced to the inferior division of the right MCA in the M2 segment. Over this, a 2.75 mm x 12 mm Onyx Frontier balloon mounted stent delivery apparatus which had been prepped with 50% contrast and 50% heparinized saline infusion, and retrogradely infused with a heparinized saline infusion was advanced using the rapid exchange technique and positioned just to the proximal and distal markers and were adequate distance from the site of the angioplastied proximal stenosis in the distal petrous ICA. The Apro intermediate catheter was then retrieved to unmask Frontier stent. The stent was then deployed by inflating the balloon using micro inflation syringe device via micro tubing to approximately 2.6  mm where it was maintained for approximately 30 seconds. The balloon was then deflated and retrieved and removed. Control arteriograms were then performed at 15 minutes, and 30 minutes post stent deployment. These continued to demonstrate excellent apposition of the device and stable appearance of the more distal angioplastied segment. Intracranially brisk flow was seen into the right MCA distribution with mild support from the posterior circulation via the posterior communicating artery. The patient was given a total of 3 mg of Integrilin intra-arterially in order to prevent acute platelet aggregation at the site of the angioplasty, and within the stent. Patient was also given a total of approximately 3 aliquots of 25 mcg of nitroglycerin intra-arterially. A final control arteriogram performed through the sheath in the distal right common carotid artery after removal of the intermediate catheter, and of the exchange micro guidewire continued to demonstrate excellent flow into the revascularized internal carotid artery intracranially on the  right side. No intraluminal filling defects, or occlusions were seen in the right MCA branches. The micro sheath was removed. An 8 French Angio-Seal closure device was deployed for hemostasis at the right groin puncture site. Distal pulses remained palpable in both feet unchanged from prior to the procedure. A flat panel CT of the brain demonstrated no evidence of hemorrhagic complications. The patient's general anesthesia was reversed, and patient was extubated. Upon recovery, the patient complained of a mild bifrontal headache but no nausea or vomiting. Her speech was clear. She had no lateralizing motor or sensory abnormalities. She was then transferred to the neuro ICU for overnight IV heparinization, and control of her blood pressure with IV cleviprex drip. The following morning, the patient was able to handle liquids and solids. She had a minimal headache. She complained of no nausea or vomiting. The right groin appeared soft with significantly decreased tenderness. No evidence of hematoma. Distal pulses remained present. She was started on aspirin 325 mg a day, and Plavix 75 mg a day. IMPRESSION: Status post balloon angioplasty of high-grade stenosis of the proximal cavernous right ICA, and stent assisted angioplasty of the proximal distal petrous segment intracranial ICA using a 2.75 mm x 12 mm Frontier balloon mounted stent. PLAN: Follow-up in 2 weeks post discharge. Patient advised to continue with aspirin 25 mg a day, and Plavix 75 mg a day in addition to her blood pressure and cholesterol lowering medications. Patient advised to maintain adequate hydration. Patient advised not to drive for a couple of weeks. Instructed to avoid stooping, bending or lifting weights above 10 pounds for 2 weeks. Patient expressed understanding and agreement with the above management plan. She was then discharged under the care of her mother. Electronically Signed   By: Julieanne Cotton M.D.   On: 10/18/2023 08:04   IR  CT Head Ltd Result Date: 10/18/2023 CLINICAL DATA:  Symptomatic severe high-grade tandem stenosis of the right internal carotid artery intracranially. EXAM: INTRACRANIAL STENT (INCL PTA) COMPARISON:  Diagnostic arteriogram of September 30, 2023. MEDICATIONS: Heparin 4,000 units IV. No antibiotic was administered within 1 hour of the procedure. ANESTHESIA/SEDATION: General anesthesia. CONTRAST:  Omnipaque 300 approximately 80 mL. FLUOROSCOPY TIME:  Fluoroscopy Time: 33 minutes 12 seconds (2580 mGy). COMPLICATIONS: None immediate. TECHNIQUE: Informed written consent was obtained from the patient after a thorough discussion of the procedural risks, benefits and alternatives. All questions were addressed. Maximal Sterile Barrier Technique was utilized including caps, mask, sterile gowns, sterile gloves, sterile drape, hand hygiene and skin antiseptic. A timeout was performed prior to the initiation of  the procedure. The right groin was prepped and draped in the usual sterile fashion. Thereafter using modified Seldinger technique, transfemoral access into the right common femoral artery was obtained without difficulty. Over an 0.035 inch guidewire, an 8 French 25 cm Pinnacle sheath was inserted. Through this, and also over an 0.035 inch guidewire, a 5 [place] JB 1 catheter was advanced to the aortic arch region and selectively positioned in the right common carotid artery. FINDINGS: The right common carotid arteriogram demonstrates the right external carotid artery and its major branches to be widely patent. The right internal carotid artery demonstrates a mildly decreased caliber in its proximal 2/3 with moderately slow ascent of contrast to the cranial skull base. Again demonstrated is the high-grade severe stenosis of the distal horizontal petrous segment, and of the proximal cavernous segment. More distally, the supraclinoid right ICA is patent with evidence of unopacified blood from the posterior circulation opacifying  the right middle cerebral artery distribution. ENDOVASCULAR REVASCULARIZATION OF TANDEM HIGH-GRADE STENOSIS OF THE RIGHT INTERNAL CAROTID ARTERY INTRACRANIALLY The diagnostic catheter in the distal right common carotid artery was then exchanged over an 0.035 inch 300 cm Rosen exchange guidewire for an 80 cm 087 Neuron Max sheath, which was advanced into the proximal 1/3 of the right internal carotid artery. The guidewire was removed. Good aspiration was obtained from the hub of the Neuron Max sheath. A gentle control arteriogram performed through this demonstrated no evidence of spasms, dissections or of intraluminal filling defects. Through this, a 2.5 mm x 9 mm Gateway balloon angioplasty microcatheter which had been prepped with 50% contrast and 50% heparinized saline infusion was then advanced over an 014 inch 200 cm standard micro guidewire with a moderate J configuration to the proximal horizontal petrous segment. Using a torque device, the micro guidewire was then advanced without difficulty through the two areas of severe stenosis, one in the distal horizontal petrous segment and one in the proximal cavernous segment followed by balloon angioplasties performed at both the sides from proximal to distal. At the more proximal severe stenosis, a 2.5 mm x 9 mm Gateway balloon was inflated to approximately 2.4 mm at the proximal stenosis using micro inflation syringe device via micro tubing for approximately a minute and a half. The balloon was then deflated and retrieved proximally. A control arteriogram performed through the sheath demonstrated significantly improved caliber and flow through the angioplastied segment. The balloon was then advanced to cover the more distal proximal cavernous stenosis. Using a micro inflation syringe device via micro tubing, a 2.5 mm x 9 mm Gateway balloon angioplasty catheter was advanced to the more distal stenosis where it was inflated again using micro inflation syringe device  via micro tubing to approximately 2.46 mm where it was maintained for a minute. Following deflation, a control arteriogram performed through the sheath demonstrated significantly improved caliber and flow through the entirety of the right internal carotid artery distally and with improved hemodynamic flow into the right middle cerebral artery distribution. An 014 inch 300 cm Zoom exchange micro guidewire with a moderate J configuration was then advanced through the 2.5 mm x 9 mm Gateway balloon catheter in the supraclinoid right ICA followed by advancement of a 125 cm 055 Apro intermediate catheter which was advanced and positioned in the supraclinoid right ICA. The exchange wire was then advanced to the inferior division of the right MCA in the M2 segment. Over this, a 2.75 mm x 12 mm Onyx Frontier balloon mounted stent delivery apparatus which  had been prepped with 50% contrast and 50% heparinized saline infusion, and retrogradely infused with a heparinized saline infusion was advanced using the rapid exchange technique and positioned just to the proximal and distal markers and were adequate distance from the site of the angioplastied proximal stenosis in the distal petrous ICA. The Apro intermediate catheter was then retrieved to unmask Frontier stent. The stent was then deployed by inflating the balloon using micro inflation syringe device via micro tubing to approximately 2.6 mm where it was maintained for approximately 30 seconds. The balloon was then deflated and retrieved and removed. Control arteriograms were then performed at 15 minutes, and 30 minutes post stent deployment. These continued to demonstrate excellent apposition of the device and stable appearance of the more distal angioplastied segment. Intracranially brisk flow was seen into the right MCA distribution with mild support from the posterior circulation via the posterior communicating artery. The patient was given a total of 3 mg of Integrilin  intra-arterially in order to prevent acute platelet aggregation at the site of the angioplasty, and within the stent. Patient was also given a total of approximately 3 aliquots of 25 mcg of nitroglycerin intra-arterially. A final control arteriogram performed through the sheath in the distal right common carotid artery after removal of the intermediate catheter, and of the exchange micro guidewire continued to demonstrate excellent flow into the revascularized internal carotid artery intracranially on the right side. No intraluminal filling defects, or occlusions were seen in the right MCA branches. The micro sheath was removed. An 8 French Angio-Seal closure device was deployed for hemostasis at the right groin puncture site. Distal pulses remained palpable in both feet unchanged from prior to the procedure. A flat panel CT of the brain demonstrated no evidence of hemorrhagic complications. The patient's general anesthesia was reversed, and patient was extubated. Upon recovery, the patient complained of a mild bifrontal headache but no nausea or vomiting. Her speech was clear. She had no lateralizing motor or sensory abnormalities. She was then transferred to the neuro ICU for overnight IV heparinization, and control of her blood pressure with IV cleviprex drip. The following morning, the patient was able to handle liquids and solids. She had a minimal headache. She complained of no nausea or vomiting. The right groin appeared soft with significantly decreased tenderness. No evidence of hematoma. Distal pulses remained present. She was started on aspirin 325 mg a day, and Plavix 75 mg a day. IMPRESSION: Status post balloon angioplasty of high-grade stenosis of the proximal cavernous right ICA, and stent assisted angioplasty of the proximal distal petrous segment intracranial ICA using a 2.75 mm x 12 mm Frontier balloon mounted stent. PLAN: Follow-up in 2 weeks post discharge. Patient advised to continue with  aspirin 25 mg a day, and Plavix 75 mg a day in addition to her blood pressure and cholesterol lowering medications. Patient advised to maintain adequate hydration. Patient advised not to drive for a couple of weeks. Instructed to avoid stooping, bending or lifting weights above 10 pounds for 2 weeks. Patient expressed understanding and agreement with the above management plan. She was then discharged under the care of her mother. Electronically Signed   By: Julieanne Cotton M.D.   On: 10/18/2023 08:04    Microbiology: Results for orders placed or performed during the hospital encounter of 11/04/23  Resp panel by RT-PCR (RSV, Flu A&B, Covid) Anterior Nasal Swab     Status: Abnormal   Collection Time: 11/04/23  2:30 PM   Specimen: Anterior  Nasal Swab  Result Value Ref Range Status   SARS Coronavirus 2 by RT PCR NEGATIVE NEGATIVE Final   Influenza A by PCR POSITIVE (A) NEGATIVE Final   Influenza B by PCR NEGATIVE NEGATIVE Final    Comment: (NOTE) The Xpert Xpress SARS-CoV-2/FLU/RSV plus assay is intended as an aid in the diagnosis of influenza from Nasopharyngeal swab specimens and should not be used as a sole basis for treatment. Nasal washings and aspirates are unacceptable for Xpert Xpress SARS-CoV-2/FLU/RSV testing.  Fact Sheet for Patients: BloggerCourse.com  Fact Sheet for Healthcare Providers: SeriousBroker.it  This test is not yet approved or cleared by the [place] FDA and has been authorized for detection and/or diagnosis of SARS-CoV-2 by FDA under an Emergency Use Authorization (EUA). This EUA will remain in effect (meaning this test can be used) for the duration of the COVID-19 declaration under Section 564(b)(1) of the Act, 21 U.S.C. section 360bbb-3(b)(1), unless the authorization is terminated or revoked.     Resp Syncytial Virus by PCR NEGATIVE NEGATIVE Final    Comment: (NOTE) Fact Sheet for  Patients: BloggerCourse.com  Fact Sheet for Healthcare Providers: SeriousBroker.it  This test is not yet approved or cleared by the [place] FDA and has been authorized for detection and/or diagnosis of SARS-CoV-2 by FDA under an Emergency Use Authorization (EUA). This EUA will remain in effect (meaning this test can be used) for the duration of the COVID-19 declaration under Section 564(b)(1) of the Act, 21 U.S.C. section 360bbb-3(b)(1), unless the authorization is terminated or revoked.  Performed at [facility], [address], [place], [place] [address]     Labs: CBC: Recent Labs  Lab 11/04/23 1340 11/05/23 0405 11/06/23 0839 11/07/23 0330  WBC 5.6 4.0 4.6 4.9  NEUTROABS  --   --   --  3.5  HGB 11.5* 10.9* 11.7* 12.0  HCT 34.1* 31.5* 34.3* 35.4*  MCV 86.8 88.0 86.4 86.3  PLT 241 202 197 222   Basic Metabolic Panel: Recent Labs  Lab 11/04/23 1340 11/04/23 1525 11/05/23 0405 11/06/23 0839 11/07/23 0330  NA 141  --  138 137 136  K 2.7*  --  2.9* 3.5 3.3*  CL 106  --  105 101 105  CO2 23  --  21* 23 24  GLUCOSE 92  --  106* 109* 117*  BUN 7  --  9 8 6   CREATININE 0.75  --  0.65 0.68 0.77  CALCIUM 8.7*  --  8.0* 8.3* 8.4*  MG  --  1.4* 1.8 1.7 1.6*  PHOS  --   --  2.6  --   --    Liver Function Tests: No results for input(s): "AST", "ALT", "ALKPHOS", "BILITOT", "PROT", "ALBUMIN" in the last 168 hours. CBG: No results for input(s): "GLUCAP" in the last 168 hours.  Discharge time spent: greater than 30 minutes.  Signed: Marolyn Haller, MD [facility] 11/09/2023

## 2023-11-11 ENCOUNTER — Telehealth: Payer: Self-pay

## 2023-11-11 ENCOUNTER — Telehealth (INDEPENDENT_AMBULATORY_CARE_PROVIDER_SITE_OTHER): Payer: BC Managed Care – PPO | Admitting: Nurse Practitioner

## 2023-11-11 ENCOUNTER — Ambulatory Visit: Payer: Self-pay | Admitting: Nurse Practitioner

## 2023-11-11 VITALS — BP 129/81 | HR 91 | Temp 97.9°F | Wt 180.0 lb

## 2023-11-11 DIAGNOSIS — J101 Influenza due to other identified influenza virus with other respiratory manifestations: Secondary | ICD-10-CM | POA: Diagnosis not present

## 2023-11-11 DIAGNOSIS — E876 Hypokalemia: Secondary | ICD-10-CM | POA: Diagnosis not present

## 2023-11-11 DIAGNOSIS — G43019 Migraine without aura, intractable, without status migrainosus: Secondary | ICD-10-CM

## 2023-11-11 DIAGNOSIS — I6521 Occlusion and stenosis of right carotid artery: Secondary | ICD-10-CM

## 2023-11-11 DIAGNOSIS — I10 Essential (primary) hypertension: Secondary | ICD-10-CM

## 2023-11-11 DIAGNOSIS — R000 Tachycardia, unspecified: Secondary | ICD-10-CM | POA: Diagnosis not present

## 2023-11-11 DIAGNOSIS — E785 Hyperlipidemia, unspecified: Secondary | ICD-10-CM | POA: Insufficient documentation

## 2023-11-11 DIAGNOSIS — Z09 Encounter for follow-up examination after completed treatment for conditions other than malignant neoplasm: Secondary | ICD-10-CM | POA: Insufficient documentation

## 2023-11-11 NOTE — Assessment & Plan Note (Signed)
 Hospital chart reviewed, including discharge summary Medications reconciled and reviewed with the patient in detail

## 2023-11-11 NOTE — Telephone Encounter (Signed)
No notes on this encounter, 1st attempt to call pt, LVM to return call to office.

## 2023-11-11 NOTE — Assessment & Plan Note (Addendum)
BP Readings from Last 3 Encounters:  11/11/23 129/81  11/07/23 118/80  10/18/23 129/75  Currently well-controlled ,continue amlodipine 5 mg daily

## 2023-11-11 NOTE — Assessment & Plan Note (Signed)
Continue atorvastatin 20 mg daily LDL goal is less than 55 Lipid panel prior to next visit

## 2023-11-11 NOTE — Telephone Encounter (Signed)
Pt is needing the work note to be addended to state she can return to work with no restrictions before she will be allowed to return to work. Pt is requesting it be updated via MyChart so she is able to access it.

## 2023-11-11 NOTE — Assessment & Plan Note (Signed)
Heart rate is 91 today

## 2023-11-11 NOTE — Transitions of Care (Post Inpatient/ED Visit) (Cosign Needed)
11/11/2023  Name: Diane [name] MRN: [id number] DOB: [birth year]  Today's TOC FU Call Status: Today's TOC FU Call Status:: Successful TOC FU Call Completed TOC FU Call Complete Date: 11/11/23 Patient's Name and Date of Birth confirmed.  Transition Care Management Follow-up Telephone Call Date of Discharge: 11/07/23 Discharge Facility: [facility]) Type of Discharge: Emergency Department Reason for ED Visit: Respiratory How have you been since you were released from the hospital?: Better Any questions or concerns?: No  Items Reviewed: Did you receive and understand the discharge instructions provided?: Yes Medications obtained,verified, and reconciled?: Yes (Medications Reviewed) Any new allergies since your discharge?: No Dietary orders reviewed?: NA Do you have support at home?: Yes People in Home: child(ren), dependent  Medications Reviewed Today: Medications Reviewed Today     Reviewed by Renelda Loma, RMA (Registered Medical Assistant) on 11/11/23 at [phone]  Med List Status: <None>   Medication Order Taking? Sig Documenting Provider Last Dose Status Informant  acetaminophen (TYLENOL) 500 MG tablet [id number] Yes Take 1,000 mg by mouth every 6 (six) hours as needed for mild pain (pain score 1-3) or headache. [provider] Taking Active Self, Pharmacy Records  ALPRAZolam Prudy Feeler) 0.5 MG tablet [id number] Yes Take 0.5 mg by mouth at bedtime. [provider] Taking Active Self, Pharmacy Records  amLODipine (NORVASC) 5 MG tablet [id number] Yes Take 1 tablet (5 mg total) by mouth daily. Donell Beers, FNP Taking Active Self, Pharmacy Records  aspirin 81 MG chewable tablet [id number] Yes Chew 1 tablet (81 mg total) by mouth daily. Glynn Octave, MD Taking Active Self, Pharmacy Records           Med Note Nedra Hai, NICOLE   Mon Nov 04, 2023  9:30 PM) Took an additional 4 tablets prior to ambulance arrival  atorvastatin (LIPITOR) 20 MG tablet [id number] Yes Take  1 tablet (20 mg total) by mouth daily.  Patient taking differently: Take 20 mg by mouth at bedtime.   Donell Beers, FNP Taking Active Self, Pharmacy Records           Med Note Nedra Hai, NICOLE   Mon Nov 04, 2023  9:31 PM)    benzonatate (TESSALON) 200 MG capsule [id number] Yes Take 1 capsule (200 mg total) by mouth 3 (three) times daily. Marolyn Haller, MD Taking Active   cetirizine [facility] ALLERGY) 10 MG tablet [id number] Yes Take 1 tablet (10 mg total) by mouth daily.  Patient taking differently: Take 10 mg by mouth at bedtime.   Donell Beers, FNP Taking Active Self, Pharmacy Records  chlorpheniramine-HYDROcodone (TUSSIONEX) 10-8 MG/5ML [id number] Yes Take 5 mLs by mouth every 12 (twelve) hours as needed for up to 7 days for cough. Marolyn Haller, MD Taking Active   clopidogrel (PLAVIX) 75 MG tablet [id number] Yes Take 1 tablet (75 mg total) by mouth daily. Brayton El, PA-C Taking Active Self, Pharmacy Records  dextromethorphan-guaiFENesin [facility] DM) 30-600 MG 12hr tablet [id number] Yes Take 1 tablet by mouth 2 (two) times daily for 7 days. Marolyn Haller, MD Taking Active   ferrous sulfate 325 (65 FE) MG tablet [id number] No Take 1 tablet (325 mg total) by mouth daily with breakfast.  Patient not taking: Reported on 11/11/2023   Marolyn Haller, MD Not Taking Active   ondansetron (ZOFRAN-ODT) 4 MG disintegrating tablet [id number] No Take 1 tablet (4 mg total) by mouth every 8 (eight) hours as needed for nausea or vomiting.  Patient not taking: Reported on 11/11/2023   Glendora Score, MD Not Taking  Active Self, Pharmacy Records           Med Note Nedra Hai, NICOLE   Mon Nov 04, 2023  9:36 PM) Keeps on hand for headaches  oseltamivir (TAMIFLU) 75 MG capsule [id number] Yes Take 1 capsule (75 mg total) by mouth 2 (two) times daily for 8 doses. Marolyn Haller, MD Taking Active   polyethylene glycol [facility] / GLYCOLAX) 17 g packet [id number] Yes Take 17 g by mouth daily. Marolyn Haller, MD Taking Active   Saline 0.65 % SOLN [id number] Yes Place 1 spray into the nose daily. [provider] Taking Active Self, Pharmacy Records            Home Care and Equipment/Supplies: Were Home Health Services Ordered?: NA Any new equipment or medical supplies ordered?: NA  Functional Questionnaire: Do you need assistance with bathing/showering or dressing?: No Do you need assistance with eating?: No Do you have difficulty maintaining continence: No Do you need assistance with getting out of bed/getting out of a chair/moving?: No Do you have difficulty managing or taking your medications?: No  Follow up appointments reviewed: PCP Follow-up appointment confirmed?: Yes Date of PCP follow-up appointment?: 11/11/23 Follow-up Provider: [facility] Follow-up appointment confirmed?: NA Do you need transportation to your follow-up appointment?: No Do you understand care options if your condition(s) worsen?: Yes-patient verbalized understanding    SIGNATURE  Renelda Loma RMA

## 2023-11-11 NOTE — Patient Instructions (Signed)

## 2023-11-11 NOTE — Assessment & Plan Note (Signed)
Potassium and magnesium was replaced in the hospital Will check lab prior to next visit Patient encouraged to increase intake of foods rich in potassium and magnesium

## 2023-11-11 NOTE — Assessment & Plan Note (Signed)
Finishing up with Tamiflu today Ready to go back to work tomorrow Work note provided

## 2023-11-11 NOTE — Assessment & Plan Note (Signed)
Lab Results  Component Value Date   NA 136 11/07/2023   K 3.3 (L) 11/07/2023   CO2 24 11/07/2023   GLUCOSE 117 (H) 11/07/2023   BUN 6 11/07/2023   CREATININE 0.77 11/07/2023   CALCIUM 8.4 (L) 11/07/2023   GFR 128.20 10/22/2011   GFRNONAA >60 11/07/2023  Potassium and magnesium was replaced in the hospital Will check lab prior to next visit Patient encouraged to increase intake of foods rich in potassium and magnesium

## 2023-11-11 NOTE — Progress Notes (Addendum)
 Virtual Visit via Video Note  I connected with Diane [name] @ on 11/11/23 at  1:40 PM EST by telephone and verified that I am speaking with the correct person using two identifiers. I spent 11 minutes in this video  health encounterr  Location: Patient: home Provider: office   I discussed the limitations, risks, security and privacy concerns of performing an evaluation and management service by telephone and the availability of in person appointments. I also discussed with the patient that there may be a patient responsible charge related to this service. The patient expressed understanding and agreed to proceed.   History of Present Illness: Diane [name]  has a past medical history of ADD (attention deficit disorder), Anemia, Anxiety, Crohn's disease (HCC), D-dimer, elevated, Fibroid, Headache(784.0), Heart murmur, History of endometriosis, Hypertension, Leiomyoma (02/09/2015), Low blood potassium, Migraine without aura, with intractable migraine, so stated, without mention of status migrainosus (06/19/2013), Pneumonia, PONV (postoperative nausea and vomiting), Seasonal allergies, Vaginal Pap smear, abnormal, and Vasovagal syncope.  Patient presents for hospital discharge follow-up.  Patient was on admission at the hospital from 11/04/2023 to 11/07/2023 for influenza A hypokalemia, tachycardia, hypomagnesemia.    Influenza A.  Stated that she will take last dose of Tamiflu today, patient states that she feels much better, currently denies fever, chills, chest pain, cough also is much better takes Tessalon as needed and drinking plenty of fluids to stay hydrated.  Headache is also currently controlled and takes Tylenol as needed. she is ready to go back to work tomorrow. Blood pressure today was 129/81/heart rate 91  For hypomagnesia/hypokalemia /mild normocytic anemia she has been trying to eat foods rich in magnesium potassium and iron as she does not like to take medication if she can normalize her  labs with diet   Observations/Objective: Patient alert and oriented x 4 no sign of acute distress noted  Assessment and Plan: Tachycardia Heart rate is 91 today  Stenosis of right carotid artery Continue aspirin 81 mg daily, Plavix 75 mg daily,, atorvastatin 20 mg daily Checking Fasting lipid panel before next visit  Intractable migraine without aura Currently under control Continue Tylenol as needed  Influenza A Finishing up with Tamiflu today Ready to go back to work tomorrow Work note provided  Hypokalemia Lab Results  Component Value Date   NA 136 11/07/2023   K 3.3 (L) 11/07/2023   CO2 24 11/07/2023   GLUCOSE 117 (H) 11/07/2023   BUN 6 11/07/2023   CREATININE 0.77 11/07/2023   CALCIUM 8.4 (L) 11/07/2023   GFR 128.20 10/22/2011   GFRNONAA >60 11/07/2023  Potassium and magnesium was replaced in the hospital Will check lab prior to next visit Patient encouraged to increase intake of foods rich in potassium and magnesium  Hospital discharge follow-up Hospital chart reviewed, including discharge summary Medications reconciled and reviewed with the patient in detail   Dyslipidemia Continue atorvastatin 20 mg daily LDL goal is less than 55 Lipid panel prior to next visit  Hypomagnesemia Potassium and magnesium was replaced in the hospital Will check lab prior to next visit Patient encouraged to increase intake of foods rich in potassium and magnesium  High blood pressure BP Readings from Last 3 Encounters:  11/11/23 129/81  11/07/23 118/80  10/18/23 129/75  Currently well-controlled ,continue amlodipine 5 mg daily   Follow Up Instructions:    I discussed the assessment and treatment plan with the patient. The patient was provided an opportunity to ask questions and all were answered. The patient agreed with  the plan and demonstrated an understanding of the instructions.   The patient was advised to call back or seek an in-person evaluation if the  symptoms worsen or if the condition fails to improve as anticipated.

## 2023-11-11 NOTE — Assessment & Plan Note (Signed)
Currently under control Continue Tylenol as needed

## 2023-11-11 NOTE — Telephone Encounter (Signed)
Copied from CRM [id number]. Topic: Appointments - Scheduling Inquiry for Clinic >> Nov 08, 2023  4:31 PM Fuller Mandril wrote: Reason for CRM: Patient called states she needs a return to work note from Hospital. Discharge just said ok to return to work but did not list whether or not there are restrictions. Patient called Hospital and they said primary care would have to review notes and provide note. Patient should return on Monday 2/17. Can submit note to MyChart. Patient already has scheduled appt for 3/3. Thank You

## 2023-11-11 NOTE — Assessment & Plan Note (Signed)
Continue aspirin 81 mg daily, Plavix 75 mg daily,, atorvastatin 20 mg daily Checking Fasting lipid panel before next visit

## 2023-11-12 ENCOUNTER — Telehealth: Payer: Self-pay

## 2023-11-12 ENCOUNTER — Telehealth: Payer: Self-pay | Admitting: Nurse Practitioner

## 2023-11-12 NOTE — Telephone Encounter (Signed)
Copied from CRM [id number]. Topic: General - Other >> Nov 11, 2023  4:14 PM Shelah Lewandowsky wrote: Reason for CRM: Needs return to work note to show no restrictions, please call  [phone]  Will send pt my chart message. KH

## 2023-11-12 NOTE — Telephone Encounter (Signed)
Letter was mailed. KH

## 2023-11-12 NOTE — Telephone Encounter (Signed)
 Done [facility]

## 2023-11-12 NOTE — Telephone Encounter (Signed)
Copied from CRM [id number]. Topic: MyChart - Other >> Nov 12, 2023  8:16 AM Dollene Primrose wrote: Reason for CRM: PATIENT NEEDS LETTER FROM [place] FAXED ASAP [phone] RETURN TO WORK WITHOUT ANY RESTRICTIONS AS OF 2/18.

## 2023-11-13 NOTE — Telephone Encounter (Signed)
 Done [facility]

## 2023-11-22 ENCOUNTER — Other Ambulatory Visit: Payer: Self-pay

## 2023-11-22 DIAGNOSIS — E785 Hyperlipidemia, unspecified: Secondary | ICD-10-CM

## 2023-11-22 DIAGNOSIS — E876 Hypokalemia: Secondary | ICD-10-CM

## 2023-11-23 LAB — BASIC METABOLIC PANEL
BUN/Creatinine Ratio: 19 (ref 9–23)
BUN: 13 mg/dL (ref 6–24)
CO2: 22 mmol/L (ref 20–29)
Calcium: 8.3 mg/dL — ABNORMAL LOW (ref 8.7–10.2)
Chloride: 107 mmol/L — ABNORMAL HIGH (ref 96–106)
Creatinine, Ser: 0.67 mg/dL (ref 0.57–1.00)
Glucose: 91 mg/dL (ref 70–99)
Potassium: 3.5 mmol/L (ref 3.5–5.2)
Sodium: 143 mmol/L (ref 134–144)
eGFR: 111 mL/min/{1.73_m2} (ref >59)

## 2023-11-23 LAB — LIPID PANEL
Chol/HDL Ratio: 2.2 ratio (ref 0.0–4.4)
Cholesterol, Total: 110 mg/dL (ref 100–199)
HDL: 49 mg/dL (ref >39)
LDL Chol Calc (NIH): 51 mg/dL (ref 0–99)
Triglycerides: 39 mg/dL (ref 0–149)
VLDL Cholesterol Cal: 10 mg/dL (ref 5–40)

## 2023-11-23 LAB — MAGNESIUM: Magnesium: 1.8 mg/dL (ref 1.6–2.3)

## 2023-11-25 ENCOUNTER — Encounter: Payer: Self-pay | Admitting: Nurse Practitioner

## 2023-11-25 ENCOUNTER — Ambulatory Visit (INDEPENDENT_AMBULATORY_CARE_PROVIDER_SITE_OTHER): Payer: Self-pay | Admitting: Nurse Practitioner

## 2023-11-25 VITALS — BP 121/66 | HR 99 | Temp 97.0°F | Wt 195.0 lb

## 2023-11-25 DIAGNOSIS — I6521 Occlusion and stenosis of right carotid artery: Secondary | ICD-10-CM

## 2023-11-25 DIAGNOSIS — I10 Essential (primary) hypertension: Secondary | ICD-10-CM | POA: Diagnosis not present

## 2023-11-25 DIAGNOSIS — E785 Hyperlipidemia, unspecified: Secondary | ICD-10-CM | POA: Diagnosis not present

## 2023-11-25 DIAGNOSIS — J309 Allergic rhinitis, unspecified: Secondary | ICD-10-CM

## 2023-11-25 DIAGNOSIS — R051 Acute cough: Secondary | ICD-10-CM | POA: Diagnosis not present

## 2023-11-25 MED ORDER — BENZONATATE 200 MG PO CAPS: 200.0000 mg | ORAL_CAPSULE | Freq: Three times a day (TID) | ORAL | 0 refills | Status: DC

## 2023-11-25 NOTE — Assessment & Plan Note (Signed)
 Lab Results  Component Value Date   NA 143 11/22/2023   K 3.5 11/22/2023   CO2 22 11/22/2023   GLUCOSE 91 11/22/2023   BUN 13 11/22/2023   CREATININE 0.67 11/22/2023   CALCIUM 8.3 (L) 11/22/2023   GFR 128.20 10/22/2011   EGFR 111 11/22/2023   GFRNONAA >60 11/07/2023  Encouraged to start calcium citrate 600 mg twice daily Has history of Crohn's disease which could be contributing to this  Checking - PTH, Intact and Calcium - VITAMIN D 25 Hydroxy (Vit-D Deficiency, Fractures)

## 2023-11-25 NOTE — Patient Instructions (Addendum)
 Please take calcium citrate 600 mg 2 times daily.  We will check your vitamin D levels as well  1. Acute cough (Primary)  - benzonatate (TESSALON) 200 MG capsule; Take 1 capsule (200 mg total) by mouth 3 (three) times daily.  Dispense: 20 capsule; Refill: 0    It is important that you exercise regularly at least 30 minutes 5 times a week as tolerated  Think about what you will eat, plan ahead. Choose " clean, green, fresh or frozen" over canned, processed or packaged foods which are more sugary, salty and fatty. 70 to 75% of food eaten should be vegetables and fruit. Three meals at set times with snacks allowed between meals, but they must be fruit or vegetables. Aim to eat over a 12 hour period , example 7 am to 7 pm, and STOP after  your last meal of the day. Drink water,generally about 64 ounces per day, no other drink is as healthy. Fruit juice is best enjoyed in a healthy way, by EATING the fruit.  Thanks for choosing [facility] we consider it a privelige to serve you.

## 2023-11-25 NOTE — Assessment & Plan Note (Signed)
 Continue cetirizine 10 mg daily Avoid allergens

## 2023-11-25 NOTE — Assessment & Plan Note (Signed)
 Lab Results  Component Value Date   CHOL 110 11/22/2023   HDL 49 11/22/2023   LDLCALC 51 11/22/2023   LDLDIRECT 135 (H) 10/14/2023   TRIG 39 11/22/2023   CHOLHDL 2.2 11/22/2023  Well-controlled on atorvastatin 20 mg daily Continue current medication

## 2023-11-25 NOTE — Assessment & Plan Note (Addendum)
 Continue aspirin 81 mg daily, Plavix 75 mg daily, atorvastatin 20 mg daily May need referral to vascular surgery but has upcoming appointment with the interventional radiologist.  Will determine if referral to vascular  is needed once she sees the interventional radiologist again

## 2023-11-25 NOTE — Assessment & Plan Note (Signed)
 BP Readings from Last 3 Encounters:  11/25/23 121/66  11/11/23 129/81  11/07/23 118/80   HTN Controlled .  On amlodipine 5 mg daily Continue current medications. No changes in management.

## 2023-11-25 NOTE — Progress Notes (Signed)
 Established Patient Office Visit  Subjective:  Patient ID: Diane [name], female    DOB: 11-Oct-1980  Age: 43 y.o. MRN: [id number]  CC:  Chief Complaint  Patient presents with   Medical Management of Chronic Issues    HPI Diane [name] is a 43 y.o. female  has a past medical history of ADD (attention deficit disorder), Anemia, Anxiety, Crohn's disease (HCC), D-dimer, elevated, Fibroid, Headache(784.0), Heart murmur, History of endometriosis, Hypertension, Leiomyoma (02/09/2015), Low blood potassium, Migraine without aura, with intractable migraine, so stated, without mention of status migrainosus (06/19/2013), Pneumonia, PONV (postoperative nausea and vomiting), Seasonal allergies, Vaginal Pap smear, abnormal, and Vasovagal syncope.  Patient presents for follow-up for her chronic medical conditions  Hypertension.  Currently on amlodipine 5 mg daily patient denies chest pain, shortness of breath, edema  Hyperlipidemia.  On atorvastatin 20 mg daily, no complaints of muscle aches  Stenosis of right carotid artery.  Takes aspirin 81 mg daily, Plavix 75 mg daily, No hematemesis, hematochezia, but reports bruises.  Acute cough.  Patient was recently treated for influenza A, has lingering cough which is sometimes productive, little sneezing.  Has been drinking plenty of fluids to maintain hydration.  She denies fever, chills, chest pain, wheezing     Past Medical History:  Diagnosis Date   ADD (attention deficit disorder)    Anemia    Anxiety    Crohn's disease (HCC)    D-dimer, elevated    Fibroid    Headache(784.0)    otc meds - last migraine 2009   Heart murmur    as child, never had any problems   History of endometriosis    Hypertension    Leiomyoma 02/09/2015   Low blood potassium    history   Migraine without aura, with intractable migraine, so stated, without mention of status migrainosus 06/19/2013   Pneumonia    x5   PONV (postoperative nausea and vomiting)     Seasonal allergies    Vaginal Pap smear, abnormal    Vasovagal syncope     Past Surgical History:  Procedure Laterality Date   ADENOIDECTOMY  2014   CESAREAN SECTION N/A 04/14/2018   Procedure: PRIMARY CESAREAN SECTION;  Surgeon: Tilda Burrow, MD;  Location: [facility];  Service: Obstetrics;  Laterality: N/A;   DILATATION & CURRETTAGE/HYSTEROSCOPY WITH RESECTOCOPE N/A 06/09/2013   Procedure: DILATATION & CURETTAGE/HYSTEROSCOPY WITH HYSTEROSCOPIC RESECTION OF SUBMUCOSAL FIBROID AND ENDOMETRIAL POLYP;  Surgeon: Serita Kyle, MD;  Location: [facility];  Service: Gynecology;  Laterality: N/A;   IR ANGIO INTRA EXTRACRAN SEL COM CAROTID INNOMINATE BILAT MOD SED  09/30/2023   IR ANGIO INTRA EXTRACRAN SEL INTERNAL CAROTID UNI R MOD SED  10/16/2023   IR ANGIO VERTEBRAL SEL SUBCLAVIAN INNOMINATE UNI L MOD SED  09/30/2023   IR ANGIO VERTEBRAL SEL VERTEBRAL UNI R MOD SED  09/30/2023   IR CT HEAD LTD  10/16/2023   IR INTRA CRAN STENT  10/16/2023   IR RADIOLOGIST EVAL & MGMT  09/30/2023   IR RADIOLOGIST EVAL & MGMT  10/03/2023   IR RADIOLOGIST EVAL & MGMT  10/31/2023   IR US GUIDE VASC ACCESS RIGHT  09/30/2023   LAPAROSCOPY N/A 06/09/2013   Procedure: LAPAROSCOPY DIAGNOSTIC;  Surgeon: Serita Kyle, MD;  Location: [facility];  Service: Gynecology;  Laterality: N/A;   RADIOLOGY WITH ANESTHESIA N/A 10/16/2023   Procedure: endovascular treatment of the right internal carotid artery tandem intracranial stenosis;  Surgeon: Julieanne Cotton, MD;  Location: [facility];  Service: Radiology;  Laterality: N/A;   ROBOT ASSISTED MYOMECTOMY N/A 02/09/2015   Procedure: MYOMECTOMY,EXCISION OF ENDOMETRIOSIS,PRE-SACRAL NEURECTOMY;  Surgeon: Fermin Schwab, MD;  Location: [facility];  Service: Gynecology;  Laterality: N/A;   ROBOTIC ASSISTED LAPAROSCOPIC LYSIS OF ADHESION N/A 06/09/2013   Procedure: ROBOTIC ASSISTED LAPAROSCOPIC LYSIS OF ADHESION; Resection of Endometriosis and excision of fibroid;  Surgeon:  Serita Kyle, MD;  Location: [facility];  Service: Gynecology;  Laterality: N/A;   TONSILLECTOMY  2014   TYMPANOSTOMY TUBE PLACEMENT  2014    Family History  Problem Relation Age of Onset   Heart disease Mother 63       CHF   Crohn's disease Mother    Asthma Mother    Asthma Brother    Heart disease Maternal Aunt    Diabetes Maternal Aunt    Stroke Maternal Aunt    Heart disease Maternal Grandfather    Stroke Maternal Grandfather     Social History   Socioeconomic History   Marital status: Divorced    Spouse name: Not on file   Number of children: 2   Years of education: some coll.   Highest education level: Not on file  Occupational History   Not on file  Tobacco Use   Smoking status: Never   Smokeless tobacco: Never  Vaping Use   Vaping status: Never Used  Substance and Sexual Activity   Alcohol use: Not Currently   Drug use: No   Sexual activity: Yes    Birth control/protection: None  Other Topics Concern   Not on file  Social History Narrative   Patient does not drink caffeine.   Patient is right handed.    Social Drivers of Corporate investment banker Strain: Not on file  Food Insecurity: No Food Insecurity (11/05/2023)   Hunger Vital Sign    Worried About Running Out of Food in the Last Year: Never true    Ran Out of Food in the Last Year: Never true  Transportation Needs: No Transportation Needs (11/05/2023)   PRAPARE - Administrator, Civil Service (Medical): No    Lack of Transportation (Non-Medical): No  Physical Activity: Not on file  Stress: Not on file  Social Connections: Not on file  Intimate Partner Violence: Not At Risk (11/05/2023)   Humiliation, Afraid, Rape, and Kick questionnaire    Fear of Current or Ex-Partner: No    Emotionally Abused: No    Physically Abused: No    Sexually Abused: No    Outpatient Medications Prior to Visit  Medication Sig Dispense Refill   acetaminophen (TYLENOL) 500 MG tablet Take 1,000 mg by  mouth every 6 (six) hours as needed for mild pain (pain score 1-3) or headache.     ALPRAZolam (XANAX) 0.5 MG tablet Take 0.5 mg by mouth at bedtime.     amLODipine (NORVASC) 5 MG tablet Take 1 tablet (5 mg total) by mouth daily. 90 tablet 0   aspirin 81 MG chewable tablet Chew 1 tablet (81 mg total) by mouth daily. 30 tablet 0   atorvastatin (LIPITOR) 20 MG tablet Take 1 tablet (20 mg total) by mouth daily. (Patient taking differently: Take 20 mg by mouth at bedtime.) 90 tablet 1   cetirizine (ZYRTEC ALLERGY) 10 MG tablet Take 1 tablet (10 mg total) by mouth daily. (Patient taking differently: Take 10 mg by mouth at bedtime.) 90 tablet 1   clopidogrel (PLAVIX) 75 MG tablet Take 1 tablet (75 mg total) by mouth  daily. 30 tablet 3   polyethylene glycol (MIRALAX / GLYCOLAX) 17 g packet Take 17 g by mouth daily.     Saline 0.65 % SOLN Place 1 spray into the nose daily.     benzonatate (TESSALON) 200 MG capsule Take 1 capsule (200 mg total) by mouth 3 (three) times daily. 20 capsule 0   ferrous sulfate 325 (65 FE) MG tablet Take 1 tablet (325 mg total) by mouth daily with breakfast. (Patient not taking: Reported on 11/25/2023) 90 tablet 0   ondansetron (ZOFRAN-ODT) 4 MG disintegrating tablet Take 1 tablet (4 mg total) by mouth every 8 (eight) hours as needed for nausea or vomiting. (Patient not taking: Reported on 11/11/2023) 20 tablet 0   No facility-administered medications prior to visit.    Allergies  Allergen Reactions   Chlorhexidine Itching    Patient also had mild redness and hives in one location.  Recommended to pre medicate with Benadryl if needed.    ROS Review of Systems  Constitutional:  Negative for appetite change, chills, fatigue and fever.  HENT:  Negative for congestion, postnasal drip, rhinorrhea and sneezing.   Respiratory:  Positive for cough. Negative for shortness of breath and wheezing.   Cardiovascular:  Negative for chest pain, palpitations and leg swelling.   Gastrointestinal:  Negative for abdominal pain, constipation, nausea and vomiting.  Genitourinary:  Negative for difficulty urinating, dysuria, flank pain and frequency.  Musculoskeletal:  Negative for arthralgias, back pain, joint swelling and myalgias.  Skin:  Negative for color change, pallor, rash and wound.  Neurological:  Negative for dizziness, facial asymmetry, weakness, numbness and headaches.  Psychiatric/Behavioral:  Negative for behavioral problems, confusion, self-injury and suicidal ideas.       Objective:    Physical Exam Vitals and nursing note reviewed.  Constitutional:      General: She is not in acute distress.    Appearance: Normal appearance. She is not ill-appearing, toxic-appearing or diaphoretic.  HENT:     Nose: Congestion present.     Mouth/Throat:     Mouth: Mucous membranes are moist.     Pharynx: Oropharynx is clear. No oropharyngeal exudate or posterior oropharyngeal erythema.  Eyes:     General: No scleral icterus.       Right eye: No discharge.        Left eye: No discharge.     Extraocular Movements: Extraocular movements intact.     Conjunctiva/sclera: Conjunctivae normal.  Cardiovascular:     Rate and Rhythm: Normal rate and regular rhythm.     Pulses: Normal pulses.     Heart sounds: Normal heart sounds. No murmur heard.    No friction rub. No gallop.  Pulmonary:     Effort: Pulmonary effort is normal. No respiratory distress.     Breath sounds: Normal breath sounds. No stridor. No wheezing, rhonchi or rales.  Chest:     Chest wall: No tenderness.  Abdominal:     General: There is no distension.     Palpations: Abdomen is soft.     Tenderness: There is no abdominal tenderness. There is no right CVA tenderness, left CVA tenderness or guarding.  Musculoskeletal:        General: No swelling, tenderness, deformity or signs of injury.     Right lower leg: No edema.     Left lower leg: No edema.  Skin:    General: Skin is warm and dry.      Capillary Refill: Capillary refill takes less than 2  seconds.     Coloration: Skin is not jaundiced or pale.     Findings: No bruising, erythema or lesion.  Neurological:     Mental Status: She is alert and oriented to person, place, and time.     Motor: No weakness.     Coordination: Coordination normal.     Gait: Gait normal.  Psychiatric:        Mood and Affect: Mood normal.        Behavior: Behavior normal.        Thought Content: Thought content normal.        Judgment: Judgment normal.     BP 121/66   Pulse 99   Temp (!) 97 F (36.1 C)   Wt 195 lb (88.5 kg)   SpO2 100%   BMI 29.65 kg/m  Wt Readings from Last 3 Encounters:  11/25/23 195 lb (88.5 kg)  11/11/23 180 lb (81.6 kg)  10/16/23 185 lb (83.9 kg)    Lab Results  Component Value Date   TSH 0.86 10/22/2011   Lab Results  Component Value Date   WBC 4.9 11/07/2023   HGB 12.0 11/07/2023   HCT 35.4 (L) 11/07/2023   MCV 86.3 11/07/2023   PLT 222 11/07/2023   Lab Results  Component Value Date   NA 143 11/22/2023   K 3.5 11/22/2023   CO2 22 11/22/2023   GLUCOSE 91 11/22/2023   BUN 13 11/22/2023   CREATININE 0.67 11/22/2023   BILITOT 0.4 09/28/2023   ALKPHOS 82 09/28/2023   AST 15 09/28/2023   ALT 14 09/28/2023   PROT 7.3 09/28/2023   ALBUMIN 3.7 09/28/2023   CALCIUM 8.3 (L) 11/22/2023   ANIONGAP 7 11/07/2023   EGFR 111 11/22/2023   GFR 128.20 10/22/2011   Lab Results  Component Value Date   CHOL 110 11/22/2023   Lab Results  Component Value Date   HDL 49 11/22/2023   Lab Results  Component Value Date   LDLCALC 51 11/22/2023   Lab Results  Component Value Date   TRIG 39 11/22/2023   Lab Results  Component Value Date   CHOLHDL 2.2 11/22/2023   Lab Results  Component Value Date   HGBA1C 5.1 10/14/2023      Assessment & Plan:   Problem List Items Addressed This Visit       Cardiovascular and Mediastinum   High blood pressure   BP Readings from Last 3 Encounters:  11/25/23  121/66  11/11/23 129/81  11/07/23 118/80   HTN Controlled .  On amlodipine 5 mg daily Continue current medications. No changes in management.         Stenosis of right carotid artery   Continue aspirin 81 mg daily, Plavix 75 mg daily, atorvastatin 20 mg daily May need referral to vascular surgery but has upcoming appointment with the interventional radiologist.  Will determine if referral to vascular  is needed once she sees the interventional radiologist again          Respiratory   Allergic rhinitis   Continue cetirizine 10 mg daily Avoid allergens        Other   Dyslipidemia   Lab Results  Component Value Date   CHOL 110 11/22/2023   HDL 49 11/22/2023   LDLCALC 51 11/22/2023   LDLDIRECT 135 (H) 10/14/2023   TRIG 39 11/22/2023   CHOLHDL 2.2 11/22/2023  Well-controlled on atorvastatin 20 mg daily Continue current medication      Hypocalcemia   Lab Results  Component Value Date   NA 143 11/22/2023   K 3.5 11/22/2023   CO2 22 11/22/2023   GLUCOSE 91 11/22/2023   BUN 13 11/22/2023   CREATININE 0.67 11/22/2023   CALCIUM 8.3 (L) 11/22/2023   GFR 128.20 10/22/2011   EGFR 111 11/22/2023   GFRNONAA >60 11/07/2023  Encouraged to start calcium citrate 600 mg twice daily Has history of Crohn's disease which could be contributing to this  Checking - PTH, Intact and Calcium - VITAMIN D 25 Hydroxy (Vit-D Deficiency, Fractures)       Relevant Orders   PTH, Intact and Calcium   VITAMIN D 25 Hydroxy (Vit-D Deficiency, Fractures)   Acute cough - Primary   Relevant Medications   benzonatate (TESSALON) 200 MG capsule    Meds ordered this encounter  Medications   benzonatate (TESSALON) 200 MG capsule    Sig: Take 1 capsule (200 mg total) by mouth 3 (three) times daily.    Dispense:  20 capsule    Refill:  0    Follow-up: Return in about 4 months (around 03/26/2024) for HYPERLIPIDEMIA, CPE.    Donell Beers, FNP

## 2023-11-29 ENCOUNTER — Other Ambulatory Visit: Payer: Self-pay

## 2023-12-13 LAB — HM MAMMOGRAPHY

## 2023-12-23 ENCOUNTER — Ambulatory Visit
Admission: RE | Admit: 2023-12-23 | Discharge: 2023-12-23 | Disposition: A | Discharge status: Home / Self Care | Source: Ambulatory Visit | Attending: Family Medicine | Admitting: Family Medicine

## 2023-12-23 ENCOUNTER — Ambulatory Visit: Payer: Self-pay

## 2023-12-23 ENCOUNTER — Other Ambulatory Visit: Payer: Self-pay | Admitting: Nurse Practitioner

## 2023-12-23 VITALS — BP 132/81 | HR 90 | Temp 99.3°F | Resp 18 | Ht 67.0 in | Wt 195.0 lb

## 2023-12-23 DIAGNOSIS — J302 Other seasonal allergic rhinitis: Secondary | ICD-10-CM

## 2023-12-23 MED ORDER — QNASL 80 MCG/ACT NA AERS: 2.0000 | INHALATION_SPRAY | Freq: Every day | NASAL | 1 refills | Status: DC

## 2023-12-23 NOTE — Discharge Instructions (Addendum)
 I have refilled your nasal spray.  Continue your allergy medication as prescribed.  Follow-up with your PCP or allergist if symptoms do not improve.  Hope you feel better soon!

## 2023-12-23 NOTE — ED Triage Notes (Signed)
 Patient states nasal drainage, sneezing due to pollen.  Takes Cetirizine daily with little relief.

## 2023-12-23 NOTE — Telephone Encounter (Signed)
 Copied from CRM [id number]. Topic: Clinical - Medication Question >> Dec 23, 2023 10:00 AM Elle L wrote: Reason for CRM: The patient has been feeling unwell due to allergies. She has been sneezing, she has itchy eyes, and an itchy throat. The earliest available appointment was too far for the patient. She has been taking Zyrtec but it is not helping. The only thing that helps is the Q-Nasal prescription nasal spray. She states her insurance requires a Prior Authorization for it but she will pay for it out of pocket. Her call back number is [phone].   Preferred Pharmacy: [id number] - [place], [place] - [address] AT [facility] [address], [place] [address] Phone: [phone]  Fax: [phone]  Chief Complaint: Seasonal allergies Symptoms: Sneezing, watery eyes, itchy eyes Frequency: Yesterday Pertinent Negatives: Patient denies relief Disposition: [] ED /[] Urgent Care (no appt availability in office) / [x] Appointment(In office/virtual)/ []  [place] [facility]/ [] Home Care/ [] Refused Recommended Disposition /[] [place] [place] [facility]/ []  Follow-up with PCP Additional Notes: Patient called in to report seasonal allergies. Patient stated she went outside yesterday and her symptoms flared-up. Patient stated that she has not been able to stop sneezing. Patient is also experiencing itchy and watery eyes. Patient takes Zyrtec x 2 everyday. Patient requested a prescription for QNASAL spray. Patient stated this medication has worked for her symptoms in the past. This RN advised patient that she should be seen in office before medication can be prescribed. This RN scheduled patient for tomorrow afternoon with PCP. Patient stated that she would go to UC tonight if she felt like she could not wait until the appointment tomorrow. Patient would still like provider to call in QNASAL today (before being seen in office). This RN advised that I would route  this conversation to the office for their discretion. Patient is requesting a call back, with an update of whether or not the prescription can be sent in.   Reason for Disposition  [1] Taking antihistamines > 2 days AND [2] nasal allergy symptoms interfere with sleep, school, or work  Answer Assessment - Initial Assessment Questions 1. SYMPTOM: "What's the main symptom you're concerned about?" (e.g., runny nose, stuffiness, sneezing, itching)     Sneezing 2. SEVERITY: "How bad is it?" "What does it keep you from doing?" (e.g., sleeping, working)      States she cannot stop sneezing 3. EYES: "Are the eyes also red, watery, and itchy?"      Yes 4. TRIGGER: "What pollen or other allergic substance do you think is causing the symptoms?"      Going outside 5. TREATMENT: "What medicine are you using?" "What medicine worked best in the past?"     Zyrtec (not helping), placed order Allegra OTC,   6. OTHER SYMPTOMS: "Do you have any other symptoms?" (e.g., coughing, difficulty breathing, wheezing)     Sneezing, itchy and watery eyes  Protocols used: Nasal Allergies (Hay Fever)-A-AH

## 2023-12-23 NOTE — ED Provider Notes (Signed)
 [facility]    CSN: [id number] Arrival date & time: 12/23/23  1750      History   Chief Complaint Chief Complaint  Patient presents with   Nasal Congestion    Sesonal allergies - Entered by patient    HPI Diane [name] is a 43 y.o. female presents for allergies.  Patient has a history of seasonal allergies that worsened this time of year.  She takes cetirizine and Allegra.  She normally takes Qnasl spray but ran out and is requesting a refill.  She denies any URI symptoms.  Endorses runny nose/congestion and sneezing.  No other concerns at this time.  HPI  Past Medical History:  Diagnosis Date   ADD (attention deficit disorder)    Anemia    Anxiety    Crohn's disease (HCC)    D-dimer, elevated    Fibroid    Headache(784.0)    otc meds - last migraine 2009   Heart murmur    as child, never had any problems   History of endometriosis    Hypertension    Leiomyoma 02/09/2015   Low blood potassium    history   Migraine without aura, with intractable migraine, so stated, without mention of status migrainosus 06/19/2013   Pneumonia    x5   PONV (postoperative nausea and vomiting)    Seasonal allergies    Vaginal Pap smear, abnormal    Vasovagal syncope     Patient Active Problem List   Diagnosis Date Noted   Hypocalcemia 11/25/2023   Acute cough 11/25/2023   Sinus tachycardia 11/11/2023   Hospital discharge follow-up 11/11/2023   Dyslipidemia 11/11/2023   Tachycardia 11/05/2023   Hypomagnesemia 11/05/2023   Influenza A 11/04/2023   Stenosis of intracranial portions of right internal carotid artery 10/16/2023   Screening for lipid disorders 10/14/2023   High blood pressure 10/14/2023   Allergic rhinitis 10/14/2023   Stenosis of right carotid artery 10/14/2023   Left leg pain 09/28/2023   Cervical dysplasia 11/30/2020   Dysmenorrhea 05/19/2019   H/O myomectomy 01/21/2018   Low back pain radiating to left leg 10/04/2015   Intractable migraine  without aura 06/19/2013   Vertigo 06/13/2013   Headache 06/13/2013   History of endometriosis 06/13/2013   Hypokalemia 05/16/2011   CROHN'S DISEASE-LARGE & SMALL INTESTINE 07/27/2009   GASTROESOPHAGEAL REFLUX DISEASE, MILD 07/12/2008    Past Surgical History:  Procedure Laterality Date   ADENOIDECTOMY  2014   CESAREAN SECTION N/A 04/14/2018   Procedure: PRIMARY CESAREAN SECTION;  Surgeon: Tilda Burrow, MD;  Location: [facility];  Service: Obstetrics;  Laterality: N/A;   DILATATION & CURRETTAGE/HYSTEROSCOPY WITH RESECTOCOPE N/A 06/09/2013   Procedure: DILATATION & CURETTAGE/HYSTEROSCOPY WITH HYSTEROSCOPIC RESECTION OF SUBMUCOSAL FIBROID AND ENDOMETRIAL POLYP;  Surgeon: Serita Kyle, MD;  Location: [facility];  Service: Gynecology;  Laterality: N/A;   IR ANGIO INTRA EXTRACRAN SEL COM CAROTID INNOMINATE BILAT MOD SED  09/30/2023   IR ANGIO INTRA EXTRACRAN SEL INTERNAL CAROTID UNI R MOD SED  10/16/2023   IR ANGIO VERTEBRAL SEL SUBCLAVIAN INNOMINATE UNI L MOD SED  09/30/2023   IR ANGIO VERTEBRAL SEL VERTEBRAL UNI R MOD SED  09/30/2023   IR CT HEAD LTD  10/16/2023   IR INTRA CRAN STENT  10/16/2023   IR RADIOLOGIST EVAL & MGMT  09/30/2023   IR RADIOLOGIST EVAL & MGMT  10/03/2023   IR RADIOLOGIST EVAL & MGMT  10/31/2023   IR US GUIDE VASC ACCESS RIGHT  09/30/2023  LAPAROSCOPY N/A 06/09/2013   Procedure: LAPAROSCOPY DIAGNOSTIC;  Surgeon: Serita Kyle, MD;  Location: [facility];  Service: Gynecology;  Laterality: N/A;   RADIOLOGY WITH ANESTHESIA N/A 10/16/2023   Procedure: endovascular treatment of the right internal carotid artery tandem intracranial stenosis;  Surgeon: Julieanne Cotton, MD;  Location: [facility];  Service: Radiology;  Laterality: N/A;   ROBOT ASSISTED MYOMECTOMY N/A 02/09/2015   Procedure: MYOMECTOMY,EXCISION OF ENDOMETRIOSIS,PRE-SACRAL NEURECTOMY;  Surgeon: Fermin Schwab, MD;  Location: [facility];  Service: Gynecology;  Laterality: N/A;   ROBOTIC ASSISTED  LAPAROSCOPIC LYSIS OF ADHESION N/A 06/09/2013   Procedure: ROBOTIC ASSISTED LAPAROSCOPIC LYSIS OF ADHESION; Resection of Endometriosis and excision of fibroid;  Surgeon: Serita Kyle, MD;  Location: [facility];  Service: Gynecology;  Laterality: N/A;   TONSILLECTOMY  2014   TYMPANOSTOMY TUBE PLACEMENT  2014    OB History     Gravida  3   Para  3   Term  2   Preterm  1   AB      Living  3      SAB      IAB      Ectopic      Multiple  0   Live Births  3            Home Medications    Prior to Admission medications   Medication Sig Start Date End Date Taking? Authorizing Provider  Beclomethasone Dipropionate (QNASL) 80 MCG/ACT AERS Place 2 sprays into the nose daily. 12/23/23  Yes Radford Pax, NP  acetaminophen (TYLENOL) 500 MG tablet Take 1,000 mg by mouth every 6 (six) hours as needed for mild pain (pain score 1-3) or headache.    [provider]  ALPRAZolam Prudy Feeler) 0.5 MG tablet Take 0.5 mg by mouth at bedtime. 10/10/23   [provider]  amLODipine (NORVASC) 5 MG tablet Take 1 tablet (5 mg total) by mouth daily. 10/14/23   Donell Beers, FNP  aspirin 81 MG chewable tablet Chew 1 tablet (81 mg total) by mouth daily. 09/13/23   Rancour, Jeannett Senior, MD  atorvastatin (LIPITOR) 20 MG tablet Take 1 tablet (20 mg total) by mouth daily. Patient taking differently: Take 20 mg by mouth at bedtime. 10/15/23 10/14/24  Donell Beers, FNP  benzonatate (TESSALON) 200 MG capsule Take 1 capsule (200 mg total) by mouth 3 (three) times daily. 11/25/23   Donell Beers, FNP  cetirizine (ZYRTEC ALLERGY) 10 MG tablet Take 1 tablet (10 mg total) by mouth daily. Patient taking differently: Take 10 mg by mouth at bedtime. 10/14/23 04/11/24  Donell Beers, FNP  clopidogrel (PLAVIX) 75 MG tablet Take 1 tablet (75 mg total) by mouth daily. 10/03/23   Brayton El, PA-C  ferrous sulfate 325 (65 FE) MG tablet Take 1 tablet (325 mg total) by mouth daily with  breakfast. Patient not taking: Reported on 11/25/2023 11/07/23   Marolyn Haller, MD  ondansetron (ZOFRAN-ODT) 4 MG disintegrating tablet Take 1 tablet (4 mg total) by mouth every 8 (eight) hours as needed for nausea or vomiting. Patient not taking: Reported on 11/11/2023 09/15/23   Kommor, Wyn Forster, MD  polyethylene glycol (MIRALAX / GLYCOLAX) 17 g packet Take 17 g by mouth daily. 11/08/23   Marolyn Haller, MD  Saline 0.65 % SOLN Place 1 spray into the nose daily.    [provider]    Family History Family History  Problem Relation Age of Onset   Heart disease Mother 93  CHF   Crohn's disease Mother    Asthma Mother    Asthma Brother    Heart disease Maternal Aunt    Diabetes Maternal Aunt    Stroke Maternal Aunt    Heart disease Maternal Grandfather    Stroke Maternal Grandfather     Social History Social History   Tobacco Use   Smoking status: Never   Smokeless tobacco: Never  Vaping Use   Vaping status: Never Used  Substance Use Topics   Alcohol use: Not Currently   Drug use: No     Allergies   Chlorhexidine   Review of Systems Review of Systems  HENT:  Positive for rhinorrhea and sneezing.      Physical Exam Triage Vital Signs ED Triage Vitals  Encounter Vitals Group     BP 12/23/23 1835 132/81     Systolic BP Percentile --      Diastolic BP Percentile --      Pulse Rate 12/23/23 1835 90     Resp 12/23/23 1835 18     Temp 12/23/23 1835 99.3 F (37.4 C)     Temp Source 12/23/23 1835 Oral     SpO2 12/23/23 1835 95 %     Weight 12/23/23 1833 195 lb (88.5 kg)     Height 12/23/23 1833 5\' 7"  (1.702 m)     Head Circumference --      Peak Flow --      Pain Score 12/23/23 1833 0     Pain Loc --      Pain Education --      Exclude from Growth Chart --    No data found.  Updated Vital Signs BP 132/81 (BP Location: Right Arm)   Pulse 90   Temp 99.3 F (37.4 C) (Oral)   Resp 18   Ht 5\' 7"  (1.702 m)   Wt 195 lb (88.5 kg)   LMP  12/22/2023 (Exact Date)   SpO2 95%   BMI 30.54 kg/m   Visual Acuity Right Eye Distance:   Left Eye Distance:   Bilateral Distance:    Right Eye Near:   Left Eye Near:    Bilateral Near:     Physical Exam Vitals and nursing note reviewed.  Constitutional:      General: She is not in acute distress.    Appearance: She is well-developed. She is not ill-appearing.  HENT:     Head: Normocephalic and atraumatic.     Right Ear: Tympanic membrane and ear canal normal.     Left Ear: Tympanic membrane and ear canal normal.     Mouth/Throat:     Mouth: Mucous membranes are moist.     Pharynx: Oropharynx is clear. Uvula midline.     Tonsils: No tonsillar exudate or tonsillar abscesses.  Eyes:     Conjunctiva/sclera: Conjunctivae normal.     Pupils: Pupils are equal, round, and reactive to light.  Cardiovascular:     Rate and Rhythm: Normal rate and regular rhythm.     Heart sounds: Normal heart sounds.  Pulmonary:     Effort: Pulmonary effort is normal.     Breath sounds: Normal breath sounds.  Musculoskeletal:     Cervical back: Normal range of motion and neck supple.  Lymphadenopathy:     Cervical: No cervical adenopathy.  Skin:    General: Skin is warm and dry.  Neurological:     General: No focal deficit present.     Mental Status: She is alert and  oriented to person, place, and time.  Psychiatric:        Mood and Affect: Mood normal.        Behavior: Behavior normal.      UC Treatments / Results  Labs (all labs ordered are listed, but only abnormal results are displayed) Labs Reviewed - No data to display  EKG   Radiology No results found.  Procedures Procedures (including critical care time)  Medications Ordered in UC Medications - No data to display  Initial Impression / Assessment and Plan / UC Course  I have reviewed the triage vital signs and the nursing notes.  Pertinent labs & imaging results that were available during my care of the patient  were reviewed by me and considered in my medical decision making (see chart for details).     Reviewed exam and symptoms with patient.  No red flags.  Refilled patient nasal spray as requested.  Continue allergy medicine as prescribed by her PCP.  Advised PCP or allergist follow-up if symptoms do not improve. Final Clinical Impressions(s) / UC Diagnoses   Final diagnoses:  Seasonal allergic rhinitis, unspecified trigger     Discharge Instructions      I have refilled your nasal spray.  Continue your allergy medication as prescribed.  Follow-up with your PCP or allergist if symptoms do not improve.  Hope you feel better soon!    ED Prescriptions     Medication Sig Dispense Auth. Provider   Beclomethasone Dipropionate (QNASL) 80 MCG/ACT AERS Place 2 sprays into the nose daily. 10.6 g Radford Pax, NP      PDMP not reviewed this encounter.   Radford Pax, NP 12/23/23 [phone]

## 2023-12-24 ENCOUNTER — Ambulatory Visit: Payer: Self-pay | Admitting: Nurse Practitioner

## 2023-12-26 ENCOUNTER — Other Ambulatory Visit: Payer: Self-pay

## 2023-12-27 LAB — PTH, INTACT AND CALCIUM
Calcium: 8.6 mg/dL — ABNORMAL LOW (ref 8.7–10.2)
PTH: 40 pg/mL (ref 15–65)

## 2023-12-27 LAB — VITAMIN D 25 HYDROXY (VIT D DEFICIENCY, FRACTURES): Vit D, 25-Hydroxy: 26 ng/mL — ABNORMAL LOW (ref 30.0–100.0)

## 2024-01-09 ENCOUNTER — Other Ambulatory Visit: Payer: Self-pay | Admitting: Nurse Practitioner

## 2024-01-09 DIAGNOSIS — I10 Essential (primary) hypertension: Secondary | ICD-10-CM

## 2024-01-21 ENCOUNTER — Ambulatory Visit: Payer: Self-pay

## 2024-01-21 NOTE — Telephone Encounter (Addendum)
 Copied from CRM [id number]. Topic: Clinical - Red Word Triage >> Jan 21, 2024  1:54 PM Ethelle Herb L wrote: Red Word that prompted transfer to Nurse Triage: Feels like hemoglobin is dropping, extremely cold and exhausted. Cravings for ice have come back.  Per agent, pt been anemic in the past so knows the symptoms, requesting an order for an infusion   Chief Complaint: suspects anemia returned Symptoms: fatigue, fingertips turning blue, feeling very cold, heavier periods than normal, soaking through a pad easily over weekend, headaches, prior dizziness Frequency: continual Pertinent Negatives: Patient denies shivering/shaking, chills, fever, SOB, chest pain, feeling out of it, weakness, pain/numbness/tingling in fingertips/hands Disposition: [] 911 / [] ED /[] Urgent Care (no appt availability in office) / [] Appointment(In office/virtual)/ []  [facility]/ [] Home Care/ [x] Refused Recommended Disposition /[]  Mobile Bus/ [x]  Follow-up with PCP Additional Notes: Pt reporting that she has hx of anemia, was hospitalized in February with Hgb 10.5, given an infusion that helped her, pt reporting that she thinks her anemia is coming back. Pt reporting that for the past couple months she has been dealing with ongoing exhaustion and feeling unusually cold even in long layered clothing, reporting that her "fingertips get blue when really bad" each day and hands "look like 'are you alive?'" every day with air conditioning in workplace. Pt reporting worsening of symptoms since Saturday when started her period, she is also now on plavix  and so her periods are heavier than they normally are, was soaking through a pad within an hour and experiencing dizziness, and confirms had feeling of almost passing out at one point, over the weekend. Pt confirms no fever, chills, shivering/shaking, numbness/tingling/pain in fingertips, weakness, or changes to heart rate. Pt reporting iron  supplements "don't work,"  requesting PCP put in order for another infusion, "wanna get ahead of" worsening symptoms so not hospitalized again. Advised pt be examined in hospital with symptoms and recent excessive blood loss. Pt refusing ED at this time, advised sending HP message with request for further recommendations and/or appt, advised ED or call back if worsening symptoms. Pt verbalized understanding.  Reason for Disposition  Follows large amount of bleeding (e.g., from vomiting, rectum, vagina  (Exception: Small brief weakness from sight of a small amount blood.)  Answer Assessment - Initial Assessment Questions 1. DESCRIPTION: "Describe how you are feeling." Really tired, yawning all the time     On plavix  so periods are heavier than normally are, so losing more blood than normally would, headaches during periods as well, on period right now, has on long sleeves, with layers and blanket in car, no chills just cold, under blankets I'm okay Fingertips get blue when really bad, if had stayed in [facility] would be blue, every day, hands "look like 'are you alive?'" Hemoglobin 10.5 in February given infusion helped but also sick First 2 days soaking through a pad within an hour, today it's lighter but first 2.5 days, did have some dizziness on Saturday and Sunday, felt like was going to pass out at one point Not noticed changes in heart rate, HR 84 right now Iron  supplements don't work 2. SEVERITY: "How bad is it?"  "Can you stand and walk?"   - MILD (0-3): Feels weak or tired, but does not interfere with work, school or normal activities.   - MODERATE (4-7): Able to stand and walk; weakness interferes with work, school, or normal activities.   - SEVERE (8-10): Unable to stand or walk; unable to do usual activities.  Able to stand and walk 3. ONSET: "When did these symptoms begin?" (e.g., hours, days, weeks, months)     Couple months of being cold, Saturday worsening 4. CAUSE: "What do you think is causing the weakness or  fatigue?" (e.g., not drinking enough fluids, medical problem, trouble sleeping)     anemia  Protocols used: Weakness (Generalized) and Fatigue-A-AH

## 2024-01-27 ENCOUNTER — Ambulatory Visit (INDEPENDENT_AMBULATORY_CARE_PROVIDER_SITE_OTHER): Payer: Self-pay | Admitting: Nurse Practitioner

## 2024-01-27 ENCOUNTER — Encounter: Payer: Self-pay | Admitting: Nurse Practitioner

## 2024-01-27 ENCOUNTER — Other Ambulatory Visit

## 2024-01-27 VITALS — BP 137/76 | HR 75 | Temp 97.0°F | Wt 207.0 lb

## 2024-01-27 DIAGNOSIS — D509 Iron deficiency anemia, unspecified: Secondary | ICD-10-CM | POA: Diagnosis not present

## 2024-01-27 NOTE — Progress Notes (Signed)
 Acute Office Visit  Subjective:     Patient ID: Diane [name], female    DOB: Mar 22, 1981, 43 y.o.   MRN: [id number]  Chief Complaint  Patient presents with   Cough     Ms Neuville has a past medical history of ADD (attention deficit disorder), Anemia, Anxiety, Crohn's disease (HCC), D-dimer, elevated, Fibroid, Headache(784.0), Heart murmur, History of endometriosis, Hypertension, Leiomyoma (02/09/2015), Low blood potassium, Migraine without aura, with intractable migraine, so stated, without mention of status migrainosus (06/19/2013), Pneumonia, PONV (postoperative nausea and vomiting), Seasonal allergies, Vaginal Pap smear, abnormal, and Vasovagal syncope.  Patient presents with complaints of anemia  Anemia.  She had received iron  infusion during her last hospitalization, was prescribed oral iron  pills but she is not taking the medication because it does not work.  Stated that she has noticed heavy menstrual bleeding the first 2 days of her menses, since she started taking Plavix .  She reports chills, fatigue, SOB.  She is requesting for iron  transfusion      Review of Systems  Constitutional:  Positive for chills and fatigue. Negative for appetite change and fever.  HENT:  Negative for congestion, postnasal drip, rhinorrhea and sneezing.   Respiratory:  Negative for cough, shortness of breath and wheezing.   Cardiovascular:  Negative for chest pain, palpitations and leg swelling.  Gastrointestinal:  Negative for abdominal pain, constipation, nausea and vomiting.  Genitourinary:  Negative for difficulty urinating, dysuria, flank pain and frequency.  Musculoskeletal:  Negative for arthralgias, back pain, joint swelling and myalgias.  Skin:  Negative for color change, pallor, rash and wound.  Neurological:  Negative for seizures, facial asymmetry, speech difficulty, weakness and numbness.  Psychiatric/Behavioral:  Negative for behavioral problems, confusion, self-injury and suicidal  ideas.         Objective:    BP 137/76   Pulse 75   Temp (!) 97 F (36.1 C)   Wt 207 lb (93.9 kg)   SpO2 100%   BMI 32.42 kg/m    Physical Exam Vitals and nursing note reviewed.  Constitutional:      General: She is not in acute distress.    Appearance: Normal appearance. She is obese. She is not ill-appearing, toxic-appearing or diaphoretic.  Eyes:     General: No scleral icterus.       Right eye: No discharge.        Left eye: No discharge.     Extraocular Movements: Extraocular movements intact.     Conjunctiva/sclera: Conjunctivae normal.  Cardiovascular:     Rate and Rhythm: Normal rate and regular rhythm.     Pulses: Normal pulses.     Heart sounds: Normal heart sounds. No murmur heard.    No friction rub. No gallop.  Pulmonary:     Effort: Pulmonary effort is normal. No respiratory distress.     Breath sounds: Normal breath sounds. No stridor. No wheezing, rhonchi or rales.  Chest:     Chest wall: No tenderness.  Abdominal:     Palpations: Abdomen is soft.  Musculoskeletal:        General: No swelling, deformity or signs of injury.  Skin:    General: Skin is warm and dry.     Capillary Refill: Capillary refill takes less than 2 seconds.     Coloration: Skin is not jaundiced or pale.     Findings: No bruising, erythema or lesion.  Neurological:     Mental Status: She is alert and oriented to person, place, and  time.     Motor: No weakness.     Coordination: Coordination normal.     Gait: Gait normal.  Psychiatric:        Mood and Affect: Mood normal.        Behavior: Behavior normal.        Thought Content: Thought content normal.        Judgment: Judgment normal.     No results found for any visits on 01/27/24.      Assessment & Plan:   Problem List Items Addressed This Visit       Other   Iron  deficiency anemia - Primary   Lab Results  Component Value Date   WBC 4.9 11/07/2023   HGB 12.0 11/07/2023   HCT 35.4 (L) 11/07/2023   MCV  86.3 11/07/2023   PLT 222 11/07/2023    Lab Results  Component Value Date   IRON  24 (L) 11/06/2023   TIBC 328 11/06/2023   FERRITIN 34 11/06/2023  Checking CBC and iron  panel Will refer patient to hematologist          Relevant Orders   CBC   Iron , TIBC and Ferritin Panel   Ambulatory referral to Hematology / Oncology    No orders of the defined types were placed in this encounter.   No follow-ups on file.  Carmalita Wakefield R Daquon Greenleaf, FNP

## 2024-01-27 NOTE — Patient Instructions (Signed)

## 2024-01-27 NOTE — Assessment & Plan Note (Addendum)
 Lab Results  Component Value Date   WBC 4.9 11/07/2023   HGB 12.0 11/07/2023   HCT 35.4 (L) 11/07/2023   MCV 86.3 11/07/2023   PLT 222 11/07/2023    Lab Results  Component Value Date   IRON  24 (L) 11/06/2023   TIBC 328 11/06/2023   FERRITIN 34 11/06/2023  Checking CBC and iron  panel Will refer patient to hematologist

## 2024-01-27 NOTE — Progress Notes (Deleted)
   Acute Office Visit  Subjective:     Patient ID: Diane [name], female    DOB: 1981/05/18, 43 y.o.   MRN: [id number]  Chief Complaint  Patient presents with   Cough    HPI Patient is in today for need for iron  infusion treatment.   Plavix  intesnifies her  menstrual bleeding, has heavy bleeding the first 2 days , needs iron  infusion. . Had iron  infusion at the hospital in February.  Fatigue, sometimes SOB. Stays cold sometimes        Objective:    BP 137/76   Pulse 75   Temp (!) 97 F (36.1 C)   Wt 207 lb (93.9 kg)   SpO2 100%   BMI 32.42 kg/m  {Vitals History (Optional):23777}  Physical Exam  No results found for any visits on 01/27/24.      Assessment & Plan:   Problem List Items Addressed This Visit   None   No orders of the defined types were placed in this encounter.   No follow-ups on file.  Ariany Kesselman R Teon Hudnall, FNP

## 2024-01-28 LAB — CBC
Hematocrit: 38 % (ref 34.0–46.6)
Hemoglobin: 12.5 g/dL (ref 11.1–15.9)
MCH: 28.5 pg (ref 26.6–33.0)
MCHC: 32.9 g/dL (ref 31.5–35.7)
MCV: 87 fL (ref 79–97)
Platelets: 276 10*3/uL (ref 150–450)
RBC: 4.38 x10E6/uL (ref 3.77–5.28)
RDW: 12.5 % (ref 11.7–15.4)
WBC: 6.9 10*3/uL (ref 3.4–10.8)

## 2024-01-28 LAB — IRON,TIBC AND FERRITIN PANEL
Ferritin: 14 ng/mL — ABNORMAL LOW (ref 15–150)
Iron Saturation: 22 % (ref 15–55)
Iron: 89 ug/dL (ref 27–159)
Total Iron Binding Capacity: 407 ug/dL (ref 250–450)
UIBC: 318 ug/dL (ref 131–425)

## 2024-02-10 ENCOUNTER — Other Ambulatory Visit: Payer: Self-pay | Admitting: Physician Assistant

## 2024-02-10 ENCOUNTER — Telehealth (HOSPITAL_COMMUNITY): Payer: Self-pay

## 2024-02-10 MED ORDER — CLOPIDOGREL BISULFATE 75 MG PO TABS
75.0000 mg | ORAL_TABLET | Freq: Every day | ORAL | 3 refills | Status: DC
Start: 2024-02-10 — End: 2024-02-11

## 2024-02-10 NOTE — Telephone Encounter (Signed)
 Pt called for a refill of her Plavix . Sent a message to our PA to call this in. AB

## 2024-02-11 ENCOUNTER — Other Ambulatory Visit: Payer: Self-pay | Admitting: Radiology

## 2024-02-11 MED ORDER — CLOPIDOGREL BISULFATE 75 MG PO TABS: 75.0000 mg | ORAL_TABLET | Freq: Every day | ORAL | 3 refills | Status: DC

## 2024-02-16 ENCOUNTER — Other Ambulatory Visit: Payer: Self-pay | Admitting: Nurse Practitioner

## 2024-02-16 DIAGNOSIS — J309 Allergic rhinitis, unspecified: Secondary | ICD-10-CM

## 2024-02-29 NOTE — Progress Notes (Unsigned)
 [facility] CONSULT NOTE  Patient Care Team: Paseda, Folashade R, FNP as PCP - General (Nurse Practitioner) Ivery Marking, MD as Consulting Physician (Obstetrics and Gynecology)  ASSESSMENT & PLAN:  Diane [name] is a 43 y.o. female with history of heavy menstrual cycles, and currently on dual antiplatelet for stent being seen for iron  deficiency anemia.  The mechanism of IDA is due to either blood loss or decreased absorptive mechanism or both. Discussed reasons for IDA. She had tolerated IV iron  in the past and reports unable to tolerate oral iron  with constipation. The patient would like to proceed asap. IV feraheme  ordered. She had this before.   Ordering iv iron  to be given at [address]  Assessment & Plan Other iron  deficiency anemia IV iron  feraheme  ordered. CBC, Iron , TIBC, ferritin in about 3 months  Orders Placed This Encounter  Procedures   CBC with Differential (Cancer Center Only)    Standing Status:   Future    Expiration Date:   03/03/2025   Ferritin    Standing Status:   Future    Expiration Date:   03/03/2025   Iron  and Iron  Binding Capacity ([facility] only)    Standing Status:   Future    Expiration Date:   03/03/2025   All questions were answered. The patient knows to call the clinic with any problems, questions or concerns.  Lowanda Ruddy, MD 6/10/20255:05 PM   CHIEF COMPLAINTS/PURPOSE OF CONSULTATION:  Anemia  HISTORY OF PRESENTING ILLNESS:  Diane [name] 43 y.o. female is here because of anemia.  Report she had iron  deficiency her whole life. She had iv iron  during pregnancy with feraheme  without allergic reaction and again this year with iv venofer .  Report she used to have heavy menstrual cycles and had ablation and several procedures. Her bleeding improved. She was placed Plavix  for stent. She is now bleeding 5 days a month and heavy for 3-4 days.  Diane [name] had not noticed any recent bleeding such as melena, hematuria or  hematochezia Her last colonoscopy was 2011. She has Crohn's and reports in remission without any medication.  She has ice craving. Report feeling cold all the time.  No gastric bypass.  MEDICAL HISTORY:  Past Medical History:  Diagnosis Date   ADD (attention deficit disorder)    Anemia    Anxiety    Crohn's disease (HCC)    D-dimer, elevated    Fibroid    Headache(784.0)    otc meds - last migraine 2009   Heart murmur    as child, never had any problems   History of endometriosis    Hypertension    Leiomyoma 02/09/2015   Low blood potassium    history   Migraine without aura, with intractable migraine, so stated, without mention of status migrainosus 06/19/2013   Pneumonia    x5   PONV (postoperative nausea and vomiting)    Seasonal allergies    Vaginal Pap smear, abnormal    Vasovagal syncope     SURGICAL HISTORY: Past Surgical History:  Procedure Laterality Date   ADENOIDECTOMY  2014   CESAREAN SECTION N/A 04/14/2018   Procedure: PRIMARY CESAREAN SECTION;  Surgeon: Albino Hum, MD;  Location: [facility] BIRTHING SUITES;  Service: Obstetrics;  Laterality: N/A;   DILATATION & CURRETTAGE/HYSTEROSCOPY WITH RESECTOCOPE N/A 06/09/2013   Procedure: DILATATION & CURETTAGE/HYSTEROSCOPY WITH HYSTEROSCOPIC RESECTION OF SUBMUCOSAL FIBROID AND ENDOMETRIAL POLYP;  Surgeon: Kandra Orn, MD;  Location: [facility];  Service: Gynecology;  Laterality: N/A;  IR ANGIO INTRA EXTRACRAN SEL COM CAROTID INNOMINATE BILAT MOD SED  09/30/2023   IR ANGIO INTRA EXTRACRAN SEL INTERNAL CAROTID UNI R MOD SED  10/16/2023   IR ANGIO VERTEBRAL SEL SUBCLAVIAN INNOMINATE UNI L MOD SED  09/30/2023   IR ANGIO VERTEBRAL SEL VERTEBRAL UNI R MOD SED  09/30/2023   IR CT HEAD LTD  10/16/2023   IR INTRA CRAN STENT  10/16/2023   IR RADIOLOGIST EVAL & MGMT  09/30/2023   IR RADIOLOGIST EVAL & MGMT  10/03/2023   IR RADIOLOGIST EVAL & MGMT  10/31/2023   IR US  GUIDE VASC ACCESS RIGHT  09/30/2023   LAPAROSCOPY N/A  06/09/2013   Procedure: LAPAROSCOPY DIAGNOSTIC;  Surgeon: Kandra Orn, MD;  Location: [facility];  Service: Gynecology;  Laterality: N/A;   RADIOLOGY WITH ANESTHESIA N/A 10/16/2023   Procedure: endovascular treatment of the right internal carotid artery tandem intracranial stenosis;  Surgeon: Luellen Sages, MD;  Location: [facility] OR;  Service: Radiology;  Laterality: N/A;   ROBOT ASSISTED MYOMECTOMY N/A 02/09/2015   Procedure: MYOMECTOMY,EXCISION OF ENDOMETRIOSIS,PRE-SACRAL NEURECTOMY;  Surgeon: Asa Bjork, MD;  Location: [facility];  Service: Gynecology;  Laterality: N/A;   ROBOTIC ASSISTED LAPAROSCOPIC LYSIS OF ADHESION N/A 06/09/2013   Procedure: ROBOTIC ASSISTED LAPAROSCOPIC LYSIS OF ADHESION; Resection of Endometriosis and excision of fibroid;  Surgeon: Kandra Orn, MD;  Location: [facility];  Service: Gynecology;  Laterality: N/A;   TONSILLECTOMY  2014   TYMPANOSTOMY TUBE PLACEMENT  2014    SOCIAL HISTORY: Social History   Socioeconomic History   Marital status: Divorced    Spouse name: Not on file   Number of children: 2   Years of education: some coll.   Highest education level: Not on file  Occupational History   Not on file  Tobacco Use   Smoking status: Never   Smokeless tobacco: Never  Vaping Use   Vaping status: Never Used  Substance and Sexual Activity   Alcohol use: Not Currently   Drug use: No   Sexual activity: Yes    Birth control/protection: None  Other Topics Concern   Not on file  Social History Narrative   Patient does not drink caffeine.   Patient is right handed.    Social Drivers of Corporate investment banker Strain: Not on file  Food Insecurity: No Food Insecurity (11/05/2023)   Hunger Vital Sign    Worried About Running Out of Food in the Last Year: Never true    Ran Out of Food in the Last Year: Never true  Transportation Needs: No Transportation Needs (11/05/2023)   PRAPARE - Administrator, Civil Service (Medical): No     Lack of Transportation (Non-Medical): No  Physical Activity: Not on file  Stress: Not on file  Social Connections: Not on file  Intimate Partner Violence: Not At Risk (11/05/2023)   Humiliation, Afraid, Rape, and Kick questionnaire    Fear of Current or Ex-Partner: No    Emotionally Abused: No    Physically Abused: No    Sexually Abused: No    FAMILY HISTORY: Family History  Problem Relation Age of Onset   Heart disease Mother 27       CHF   Crohn's disease Mother    Asthma Mother    Asthma Brother    Heart disease Maternal Aunt    Diabetes Maternal Aunt    Stroke Maternal Aunt    Heart disease Maternal Grandfather    Stroke Maternal Grandfather  ALLERGIES:  is allergic to chlorhexidine .  MEDICATIONS:  Current Outpatient Medications  Medication Sig Dispense Refill   OVER THE COUNTER MEDICATION Take 1 tablet by mouth. Calcium  supplement daily--unknown dose     acetaminophen  (TYLENOL ) 500 MG tablet Take 1,000 mg by mouth every 6 (six) hours as needed for mild pain (pain score 1-3) or headache.     ALPRAZolam  (XANAX ) 0.5 MG tablet Take 0.5 mg by mouth at bedtime as needed for anxiety.     amLODipine  (NORVASC ) 5 MG tablet TAKE 1 TABLET (5 MG TOTAL) BY MOUTH DAILY. 90 tablet 0   aspirin  81 MG chewable tablet Chew 1 tablet (81 mg total) by mouth daily. 30 tablet 0   atorvastatin  (LIPITOR) 20 MG tablet Take 1 tablet (20 mg total) by mouth daily. (Patient taking differently: Take 20 mg by mouth at bedtime.) 90 tablet 1   Beclomethasone Dipropionate  (QNASL ) 80 MCG/ACT AERS Place 2 sprays into the nose daily. 10.6 g 1   cetirizine  (ZYRTEC ) 10 MG tablet TAKE 1 TABLET BY MOUTH EVERY DAY 90 tablet 1   clopidogrel  (PLAVIX ) 75 MG tablet Take 1 tablet (75 mg total) by mouth daily. 90 tablet 3   polyethylene glycol (MIRALAX  / GLYCOLAX ) 17 g packet Take 17 g by mouth daily. (Patient taking differently: Take 17 g by mouth daily as needed.)     Saline 0.65 % SOLN Place 1 spray into the  nose daily. (Patient not taking: Reported on 01/27/2024)     No current facility-administered medications for this visit.    REVIEW OF SYSTEMS:   All relevant systems were reviewed with the patient and are negative.  PHYSICAL EXAMINATION:  Vitals:   03/03/24 1446  BP: 135/81  Pulse: 81  Resp: 16  Temp: 97.6 F (36.4 C)  SpO2: 100%   Filed Weights   03/03/24 1446  Weight: 212 lb 6 oz (96.3 kg)    GENERAL: alert, no distress and comfortable SKIN: skin color normal  RADIOGRAPHIC STUDIES: I have personally reviewed the radiological images as listed and agreed with the findings in the report.

## 2024-03-03 ENCOUNTER — Inpatient Hospital Stay

## 2024-03-03 VITALS — BP 135/81 | HR 81 | Temp 97.6°F | Resp 16 | Ht 67.0 in | Wt 212.4 lb

## 2024-03-03 DIAGNOSIS — D508 Other iron deficiency anemias: Secondary | ICD-10-CM | POA: Diagnosis not present

## 2024-03-03 NOTE — Assessment & Plan Note (Addendum)
 IV iron  feraheme  ordered. CBC, Iron , TIBC, ferritin in about 3 months

## 2024-03-11 ENCOUNTER — Telehealth (HOSPITAL_COMMUNITY): Payer: Self-pay

## 2024-03-11 ENCOUNTER — Telehealth: Payer: Self-pay

## 2024-03-11 ENCOUNTER — Other Ambulatory Visit (HOSPITAL_COMMUNITY): Payer: Self-pay | Admitting: Interventional Radiology

## 2024-03-11 DIAGNOSIS — I6521 Occlusion and stenosis of right carotid artery: Secondary | ICD-10-CM

## 2024-03-11 NOTE — Telephone Encounter (Signed)
 Left a voicemail with the scheduled appointment details.

## 2024-03-11 NOTE — Telephone Encounter (Signed)
 Pt called worried about her right sided headaches and also wanting to schedule her cta. Insurance is still pending. I have sent a message to our PA Aimee to discuss with Dr. Alvira Josephs in case she needs to come in for a clinic visit. I will f/u after I hear back from them. AB

## 2024-03-12 ENCOUNTER — Telehealth: Payer: Self-pay

## 2024-03-12 NOTE — Telephone Encounter (Signed)
 The patient left a voicemail informing me that she was confused about the appointments scheduled. I left the patient as voicemail informing the patient that her iron  infusion would be preformed at [facility] and they would call her.

## 2024-03-18 ENCOUNTER — Other Ambulatory Visit

## 2024-03-19 ENCOUNTER — Telehealth: Payer: Self-pay

## 2024-03-19 ENCOUNTER — Ambulatory Visit

## 2024-03-19 NOTE — Telephone Encounter (Signed)
 Dr. Tina, patient will be scheduled as soon as possible.  Auth Submission: APPROVED Site of care: Site of care: [facility] Payer: BCBS commercial Medication & CPT/J Code(s) submitted: Feraheme  (ferumoxytol ) [id number] Diagnosis Code:  Route of submission (phone, fax, portal): fax Phone # Fax # [phone]  Auth type: Buy/Bill PB Units/visits requested: 510mg  x 2 doses Reference number: [id number] Approval from: 03/04/24 to 03/04/25

## 2024-03-20 ENCOUNTER — Encounter (HOSPITAL_COMMUNITY): Payer: Self-pay | Admitting: Interventional Radiology

## 2024-03-20 ENCOUNTER — Ambulatory Visit (INDEPENDENT_AMBULATORY_CARE_PROVIDER_SITE_OTHER)

## 2024-03-20 VITALS — BP 135/82 | HR 71 | Temp 98.2°F | Resp 16 | Ht 67.7 in | Wt 210.4 lb

## 2024-03-20 DIAGNOSIS — D509 Iron deficiency anemia, unspecified: Secondary | ICD-10-CM | POA: Diagnosis not present

## 2024-03-20 DIAGNOSIS — N920 Excessive and frequent menstruation with regular cycle: Secondary | ICD-10-CM | POA: Diagnosis not present

## 2024-03-20 DIAGNOSIS — D500 Iron deficiency anemia secondary to blood loss (chronic): Secondary | ICD-10-CM

## 2024-03-20 MED ORDER — SODIUM CHLORIDE 0.9 % IV SOLN
510.0000 mg | Freq: Once | INTRAVENOUS | Status: AC
  Administered 2024-03-20: 510 mg via INTRAVENOUS
  Filled 2024-03-20: qty 17

## 2024-03-20 NOTE — Progress Notes (Signed)
 Diagnosis: Iron  Deficiency Anemia  Provider:  Praveen Mannam MD  Procedure: IV Infusion  IV Type: Peripheral, IV Location: R Antecubital  Feraheme  (Ferumoxytol ), Dose: 210 mg  Infusion Start Time: 1026  Infusion Stop Time: 1046  Post Infusion IV Care: Observation period completed and Peripheral IV Discontinued  Discharge: Condition: Good, Destination: Home . AVS Declined  Performed by:  Leita FORBES Miles, LPN

## 2024-03-24 ENCOUNTER — Ambulatory Visit: Payer: Self-pay | Admitting: Nurse Practitioner

## 2024-03-24 ENCOUNTER — Ambulatory Visit (INDEPENDENT_AMBULATORY_CARE_PROVIDER_SITE_OTHER): Payer: Self-pay | Admitting: Nurse Practitioner

## 2024-03-24 ENCOUNTER — Encounter: Payer: Self-pay | Admitting: Nurse Practitioner

## 2024-03-24 VITALS — BP 141/72 | HR 74 | Temp 98.0°F

## 2024-03-24 DIAGNOSIS — E559 Vitamin D deficiency, unspecified: Secondary | ICD-10-CM

## 2024-03-24 DIAGNOSIS — I10 Essential (primary) hypertension: Secondary | ICD-10-CM | POA: Diagnosis not present

## 2024-03-24 DIAGNOSIS — M79671 Pain in right foot: Secondary | ICD-10-CM | POA: Diagnosis not present

## 2024-03-24 DIAGNOSIS — E785 Hyperlipidemia, unspecified: Secondary | ICD-10-CM

## 2024-03-24 DIAGNOSIS — D508 Other iron deficiency anemias: Secondary | ICD-10-CM

## 2024-03-24 DIAGNOSIS — E669 Obesity, unspecified: Secondary | ICD-10-CM

## 2024-03-24 DIAGNOSIS — I6521 Occlusion and stenosis of right carotid artery: Secondary | ICD-10-CM

## 2024-03-24 NOTE — Progress Notes (Signed)
 Established Patient Office Visit  Subjective:  Patient ID: Diane [name], female    DOB: 02-Aug-1981  Age: 43 y.o. MRN: [id number]  CC: No chief complaint on file.   HPI Diane [name] is a 43 y.o. female  has a past medical history of ADD (attention deficit disorder), Anemia, Anxiety, Crohn's disease (HCC), D-dimer, elevated, Fibroid, Headache(784.0), Heart murmur, History of endometriosis, Hypertension, Leiomyoma (02/09/2015), Low blood potassium, Migraine without aura, with intractable migraine, so stated, without mention of status migrainosus (06/19/2013), Pneumonia, PONV (postoperative nausea and vomiting), Seasonal allergies, Vaginal Pap smear, abnormal, and Vasovagal syncope.  Patient presents for follow-up for her chronic medical conditions  Hypertension.  Currently on amlodipine  5 mg daily, currently denies chest pain shortness of breath edema.  Stenosis of right carotid artery.  Currently on atorvastatin  20 mg daily, aspirin  81 mg daily, Plavix  75 mg daily.  Has upcoming CT angio head neck.  Sees Dr. Dolphus  Right heel pain.  Patient complains of right heel pain that has been going on for a while, wearing crocs makes her pain feel better.  She denies trauma  Obesity.  Reports that her diet can be daily used to cook at home but not anymore she has recently started to exercise daily, walks 2 miles daily.  Was not able to exercise much prior to getting iron  infusion for anemia due to fatigue       Past Medical History:  Diagnosis Date   ADD (attention deficit disorder)    Anemia    Anxiety    Crohn's disease (HCC)    D-dimer, elevated    Fibroid    Headache(784.0)    otc meds - last migraine 2009   Heart murmur    as child, never had any problems   History of endometriosis    Hypertension    Leiomyoma 02/09/2015   Low blood potassium    history   Migraine without aura, with intractable migraine, so stated, without mention of status migrainosus 06/19/2013    Pneumonia    x5   PONV (postoperative nausea and vomiting)    Seasonal allergies    Vaginal Pap smear, abnormal    Vasovagal syncope     Past Surgical History:  Procedure Laterality Date   ADENOIDECTOMY  2014   CESAREAN SECTION N/A 04/14/2018   Procedure: PRIMARY CESAREAN SECTION;  Surgeon: Edsel Norleen GAILS, MD;  Location: [facility] BIRTHING SUITES;  Service: Obstetrics;  Laterality: N/A;   DILATATION & CURRETTAGE/HYSTEROSCOPY WITH RESECTOCOPE N/A 06/09/2013   Procedure: DILATATION & CURETTAGE/HYSTEROSCOPY WITH HYSTEROSCOPIC RESECTION OF SUBMUCOSAL FIBROID AND ENDOMETRIAL POLYP;  Surgeon: Dickie DELENA Carder, MD;  Location: [facility];  Service: Gynecology;  Laterality: N/A;   IR ANGIO INTRA EXTRACRAN SEL COM CAROTID INNOMINATE BILAT MOD SED  09/30/2023   IR ANGIO INTRA EXTRACRAN SEL INTERNAL CAROTID UNI R MOD SED  10/16/2023   IR ANGIO VERTEBRAL SEL SUBCLAVIAN INNOMINATE UNI L MOD SED  09/30/2023   IR ANGIO VERTEBRAL SEL VERTEBRAL UNI R MOD SED  09/30/2023   IR CT HEAD LTD  10/16/2023   IR INTRA CRAN STENT  10/16/2023   IR RADIOLOGIST EVAL & MGMT  09/30/2023   IR RADIOLOGIST EVAL & MGMT  10/03/2023   IR RADIOLOGIST EVAL & MGMT  10/31/2023   IR US  GUIDE VASC ACCESS RIGHT  09/30/2023   LAPAROSCOPY N/A 06/09/2013   Procedure: LAPAROSCOPY DIAGNOSTIC;  Surgeon: Dickie DELENA Carder, MD;  Location: [facility];  Service: Gynecology;  Laterality: N/A;   RADIOLOGY  WITH ANESTHESIA N/A 10/16/2023   Procedure: endovascular treatment of the right internal carotid artery tandem intracranial stenosis;  Surgeon: Dolphus Carrion, MD;  Location: [facility] OR;  Service: Radiology;  Laterality: N/A;   ROBOT ASSISTED MYOMECTOMY N/A 02/09/2015   Procedure: MYOMECTOMY,EXCISION OF ENDOMETRIOSIS,PRE-SACRAL NEURECTOMY;  Surgeon: Cynthia Loss, MD;  Location: [facility];  Service: Gynecology;  Laterality: N/A;   ROBOTIC ASSISTED LAPAROSCOPIC LYSIS OF ADHESION N/A 06/09/2013   Procedure: ROBOTIC ASSISTED LAPAROSCOPIC LYSIS OF ADHESION;  Resection of Endometriosis and excision of fibroid;  Surgeon: Dickie DELENA Carder, MD;  Location: [facility];  Service: Gynecology;  Laterality: N/A;   TONSILLECTOMY  2014   TYMPANOSTOMY TUBE PLACEMENT  2014    Family History  Problem Relation Age of Onset   Heart disease Mother 75       CHF   Crohn's disease Mother    Asthma Mother    Asthma Brother    Heart disease Maternal Aunt    Diabetes Maternal Aunt    Stroke Maternal Aunt    Heart disease Maternal Grandfather    Stroke Maternal Grandfather     Social History   Socioeconomic History   Marital status: Divorced    Spouse name: Not on file   Number of children: 2   Years of education: some coll.   Highest education level: Not on file  Occupational History   Not on file  Tobacco Use   Smoking status: Never   Smokeless tobacco: Never  Vaping Use   Vaping status: Never Used  Substance and Sexual Activity   Alcohol use: Not Currently   Drug use: No   Sexual activity: Yes    Birth control/protection: None  Other Topics Concern   Not on file  Social History Narrative   Patient does not drink caffeine.   Patient is right handed.    Social Drivers of Corporate investment banker Strain: Not on file  Food Insecurity: No Food Insecurity (11/05/2023)   Hunger Vital Sign    Worried About Running Out of Food in the Last Year: Never true    Ran Out of Food in the Last Year: Never true  Transportation Needs: No Transportation Needs (11/05/2023)   PRAPARE - Administrator, Civil Service (Medical): No    Lack of Transportation (Non-Medical): No  Physical Activity: Not on file  Stress: Not on file  Social Connections: Not on file  Intimate Partner Violence: Not At Risk (11/05/2023)   Humiliation, Afraid, Rape, and Kick questionnaire    Fear of Current or Ex-Partner: No    Emotionally Abused: No    Physically Abused: No    Sexually Abused: No    Outpatient Medications Prior to Visit  Medication Sig Dispense  Refill   acetaminophen  (TYLENOL ) 500 MG tablet Take 1,000 mg by mouth every 6 (six) hours as needed for mild pain (pain score 1-3) or headache.     amLODipine  (NORVASC ) 5 MG tablet TAKE 1 TABLET (5 MG TOTAL) BY MOUTH DAILY. 90 tablet 0   aspirin  81 MG chewable tablet Chew 1 tablet (81 mg total) by mouth daily. 30 tablet 0   atorvastatin  (LIPITOR) 20 MG tablet Take 1 tablet (20 mg total) by mouth daily. 90 tablet 1   cetirizine  (ZYRTEC ) 10 MG tablet TAKE 1 TABLET BY MOUTH EVERY DAY 90 tablet 1   clopidogrel  (PLAVIX ) 75 MG tablet Take 1 tablet (75 mg total) by mouth daily. 90 tablet 3   OVER THE COUNTER MEDICATION Take  1 tablet by mouth. Calcium  supplement daily--unknown dose     polyethylene glycol (MIRALAX  / GLYCOLAX ) 17 g packet Take 17 g by mouth daily.     ALPRAZolam  (XANAX ) 0.5 MG tablet Take 0.5 mg by mouth at bedtime as needed for anxiety. (Patient not taking: Reported on 03/24/2024)     Beclomethasone Dipropionate  (QNASL ) 80 MCG/ACT AERS Place 2 sprays into the nose daily. (Patient not taking: Reported on 03/24/2024) 10.6 g 1   Saline 0.65 % SOLN Place 1 spray into the nose daily. (Patient not taking: Reported on 01/27/2024)     No facility-administered medications prior to visit.    Allergies  Allergen Reactions   Chlorhexidine  Itching    Patient also had mild redness and hives in one location.  Recommended to pre medicate with Benadryl  if needed.    ROS Review of Systems  Constitutional:  Negative for appetite change, chills, fatigue and fever.  HENT:  Negative for congestion, postnasal drip, rhinorrhea and sneezing.   Respiratory:  Negative for cough, shortness of breath and wheezing.   Cardiovascular:  Negative for chest pain, palpitations and leg swelling.  Gastrointestinal:  Negative for abdominal pain, constipation, nausea and vomiting.  Genitourinary:  Negative for difficulty urinating, dysuria, flank pain and frequency.  Musculoskeletal:  Negative for joint swelling and  myalgias.  Skin:  Negative for color change, pallor, rash and wound.  Neurological:  Negative for dizziness, facial asymmetry, weakness, numbness and headaches.  Psychiatric/Behavioral:  Negative for behavioral problems, confusion, self-injury and suicidal ideas.       Objective:    Physical Exam Vitals and nursing note reviewed.  Constitutional:      General: She is not in acute distress.    Appearance: Normal appearance. She is obese. She is not ill-appearing, toxic-appearing or diaphoretic.   Eyes:     General: No scleral icterus.       Right eye: No discharge.        Left eye: No discharge.     Extraocular Movements: Extraocular movements intact.     Conjunctiva/sclera: Conjunctivae normal.    Cardiovascular:     Rate and Rhythm: Normal rate and regular rhythm.     Pulses: Normal pulses.     Heart sounds: Normal heart sounds. No murmur heard.    No friction rub. No gallop.  Pulmonary:     Effort: Pulmonary effort is normal. No respiratory distress.     Breath sounds: Normal breath sounds. No stridor. No wheezing, rhonchi or rales.  Chest:     Chest wall: No tenderness.  Abdominal:     General: There is no distension.     Palpations: Abdomen is soft.     Tenderness: There is no abdominal tenderness. There is no right CVA tenderness, left CVA tenderness or guarding.   Musculoskeletal:        General: No swelling, tenderness, deformity or signs of injury.     Right lower leg: No edema.     Left lower leg: No edema.     Comments: No tenderness on palpation of the right heel but reports soreness with ambulation, skin warm and dry, no redness or swelling noted has palpable pedal pulses   Skin:    General: Skin is warm and dry.     Capillary Refill: Capillary refill takes less than 2 seconds.     Coloration: Skin is not jaundiced or pale.     Findings: No bruising, erythema or lesion.   Neurological:  Mental Status: She is alert and oriented to person, place, and  time.     Motor: No weakness.     Gait: Gait normal.   Psychiatric:        Mood and Affect: Mood normal.        Behavior: Behavior normal.        Thought Content: Thought content normal.        Judgment: Judgment normal.     BP (!) 141/72   Pulse 74   Temp 98 F (36.7 C) (Oral)   SpO2 100%  Wt Readings from Last 3 Encounters:  03/20/24 210 lb 6.4 oz (95.4 kg)  03/03/24 212 lb 6 oz (96.3 kg)  01/27/24 207 lb (93.9 kg)    Lab Results  Component Value Date   TSH 0.86 10/22/2011   Lab Results  Component Value Date   WBC 6.9 01/27/2024   HGB 12.5 01/27/2024   HCT 38.0 01/27/2024   MCV 87 01/27/2024   PLT 276 01/27/2024   Lab Results  Component Value Date   NA 143 11/22/2023   K 3.5 11/22/2023   CO2 22 11/22/2023   GLUCOSE 91 11/22/2023   BUN 13 11/22/2023   CREATININE 0.67 11/22/2023   BILITOT 0.4 09/28/2023   ALKPHOS 82 09/28/2023   AST 15 09/28/2023   ALT 14 09/28/2023   PROT 7.3 09/28/2023   ALBUMIN 3.7 09/28/2023   CALCIUM  8.6 (L) 12/26/2023   ANIONGAP 7 11/07/2023   EGFR 111 11/22/2023   GFR 128.20 10/22/2011   Lab Results  Component Value Date   CHOL 110 11/22/2023   Lab Results  Component Value Date   HDL 49 11/22/2023   Lab Results  Component Value Date   LDLCALC 51 11/22/2023   Lab Results  Component Value Date   TRIG 39 11/22/2023   Lab Results  Component Value Date   CHOLHDL 2.2 11/22/2023   Lab Results  Component Value Date   HGBA1C 5.1 10/14/2023      Assessment & Plan:   Problem List Items Addressed This Visit       Cardiovascular and Mediastinum   High blood pressure - Primary   Blood pressure goal is less than 130/80 Patient chose to continue amlodipine  5 mg daily for now and plans on making lifestyle changes.  She will monitor blood pressure at home keep a log and bring to next office visit DASH diet and commitment to daily physical activity for a minimum of 30 minutes discussed and encouraged, as a part of  hypertension management. The importance of attaining a healthy weight is also discussed. Follow-up in 6 weeks     03/24/2024    3:15 PM 03/24/2024    3:11 PM 03/20/2024   11:19 AM 03/20/2024   10:09 AM 03/03/2024    2:46 PM 01/27/2024    9:28 AM 01/27/2024    9:25 AM  BP/Weight  Systolic BP 141 143 135 137 135 137 136  Diastolic BP 72 92 82 86 81 76 81  Wt. (Lbs)    210.4 212.38  207  BMI    32.28 kg/m2 33.26 kg/m2  32.42 kg/m2           Stenosis of right carotid artery   Continue aspirin  81 mg daily, Plavix  75 mg daily, atorvastatin  20 mg daily Lab Results  Component Value Date   CHOL 110 11/22/2023   HDL 49 11/22/2023   LDLCALC 51 11/22/2023   LDLDIRECT 135 (H) 10/14/2023   TRIG  39 11/22/2023   CHOLHDL 2.2 11/22/2023           Other   Dyslipidemia   Controlled on atorvastatin  20 mg daily Continue current medication      Hypocalcemia   Lab Results  Component Value Date   NA 143 11/22/2023   K 3.5 11/22/2023   CO2 22 11/22/2023   GLUCOSE 91 11/22/2023   BUN 13 11/22/2023   CREATININE 0.67 11/22/2023   CALCIUM  8.6 (L) 12/26/2023   GFR 128.20 10/22/2011   EGFR 111 11/22/2023   GFRNONAA >60 11/07/2023    On calcium  supplement will recheck labs at next visit      Iron  deficiency anemia   Followed by hematology Receiving iron  infusion      Pain of right heel   -Encouraged to take Tylenol  as needed, wear comfortable shoes, use soft shoe inserts,  application of heat PRN  -Lose weight Consider referral to podiatry if no improvement      Obesity (BMI 30-39.9)   Wt Readings from Last 3 Encounters:  03/20/24 210 lb 6.4 oz (95.4 kg)  03/03/24 212 lb 6 oz (96.3 kg)  01/27/24 207 lb (93.9 kg)  BMI 32.28  Patient counseled on low-carb diet Encouraged to engage in regular moderate to vigorous exercises at least 150 minutes weekly as tolerated Benefits of healthy weights discussed      Vitamin D  deficiency   Last vitamin D  Lab Results  Component Value Date    VD25OH 26.0 (L) 12/26/2023  Recommended vitamin D  1000 units daily but not taking the medication       No orders of the defined types were placed in this encounter.   Follow-up: Return in about 6 weeks (around 05/05/2024) for HTN.    Rolfe Hartsell R Keashia Haskins, FNP

## 2024-03-24 NOTE — Assessment & Plan Note (Addendum)
-  Encouraged to take Tylenol  as needed, wear comfortable shoes, use soft shoe inserts,  application of heat PRN  -Lose weight Consider referral to podiatry if no improvement

## 2024-03-24 NOTE — Assessment & Plan Note (Signed)
 Controlled on atorvastatin  20 mg daily Continue current medication

## 2024-03-24 NOTE — Assessment & Plan Note (Addendum)
 Lab Results  Component Value Date   NA 143 11/22/2023   K 3.5 11/22/2023   CO2 22 11/22/2023   GLUCOSE 91 11/22/2023   BUN 13 11/22/2023   CREATININE 0.67 11/22/2023   CALCIUM  8.6 (L) 12/26/2023   GFR 128.20 10/22/2011   EGFR 111 11/22/2023   GFRNONAA >60 11/07/2023    On calcium  supplement will recheck labs at next visit

## 2024-03-24 NOTE — Assessment & Plan Note (Signed)
 Followed by hematology Receiving iron  infusion

## 2024-03-24 NOTE — Assessment & Plan Note (Signed)
 Continue aspirin  81 mg daily, Plavix  75 mg daily, atorvastatin  20 mg daily Lab Results  Component Value Date   CHOL 110 11/22/2023   HDL 49 11/22/2023   LDLCALC 51 11/22/2023   LDLDIRECT 135 (H) 10/14/2023   TRIG 39 11/22/2023   CHOLHDL 2.2 11/22/2023

## 2024-03-24 NOTE — Assessment & Plan Note (Signed)
 Last vitamin D  Lab Results  Component Value Date   VD25OH 26.0 (L) 12/26/2023  Recommended vitamin D  1000 units daily but not taking the medication

## 2024-03-24 NOTE — Patient Instructions (Signed)
Around 3 times per week, check your blood pressure 2 times per day. once in the morning and once in the evening. The readings should be at least one minute apart. Write down these values and bring them to your next nurse visit/appointment.  When you check your BP, make sure you have been doing something calm/relaxing 5 minutes prior to checking. Both feet should be flat on the floor and you should be sitting. Use your left arm and make sure it is in a relaxed position (on a table), and that the cuff is at the approximate level/height of your heart.    It is important that you exercise regularly at least 30 minutes 5 times a week as tolerated  Think about what you will eat, plan ahead. Choose " clean, green, fresh or frozen" over canned, processed or packaged foods which are more sugary, salty and fatty. 70 to 75% of food eaten should be vegetables and fruit. Three meals at set times with snacks allowed between meals, but they must be fruit or vegetables. Aim to eat over a 12 hour period , example 7 am to 7 pm, and STOP after  your last meal of the day. Drink water,generally about 64 ounces per day, no other drink is as healthy. Fruit juice is best enjoyed in a healthy way, by EATING the fruit.  Thanks for choosing [facility] we consider it a privelige to serve you.

## 2024-03-24 NOTE — Assessment & Plan Note (Addendum)
 Blood pressure goal is less than 130/80 Patient chose to continue amlodipine  5 mg daily for now and plans on making lifestyle changes.  She will monitor blood pressure at home keep a log and bring to next office visit DASH diet and commitment to daily physical activity for a minimum of 30 minutes discussed and encouraged, as a part of hypertension management. The importance of attaining a healthy weight is also discussed. Follow-up in 6 weeks     03/24/2024    3:15 PM 03/24/2024    3:11 PM 03/20/2024   11:19 AM 03/20/2024   10:09 AM 03/03/2024    2:46 PM 01/27/2024    9:28 AM 01/27/2024    9:25 AM  BP/Weight  Systolic BP 141 143 135 137 135 137 136  Diastolic BP 72 92 82 86 81 76 81  Wt. (Lbs)    210.4 212.38  207  BMI    32.28 kg/m2 33.26 kg/m2  32.42 kg/m2

## 2024-03-24 NOTE — Assessment & Plan Note (Addendum)
 Wt Readings from Last 3 Encounters:  03/20/24 210 lb 6.4 oz (95.4 kg)  03/03/24 212 lb 6 oz (96.3 kg)  01/27/24 207 lb (93.9 kg)  BMI 32.28  Patient counseled on low-carb diet Encouraged to engage in regular moderate to vigorous exercises at least 150 minutes weekly as tolerated Benefits of healthy weights discussed

## 2024-03-30 ENCOUNTER — Ambulatory Visit

## 2024-03-30 ENCOUNTER — Ambulatory Visit: Payer: Self-pay | Admitting: Nurse Practitioner

## 2024-03-30 VITALS — BP 133/83 | HR 80 | Temp 98.6°F | Resp 16 | Ht 67.75 in | Wt 213.4 lb

## 2024-03-30 DIAGNOSIS — D509 Iron deficiency anemia, unspecified: Secondary | ICD-10-CM

## 2024-03-30 DIAGNOSIS — D500 Iron deficiency anemia secondary to blood loss (chronic): Secondary | ICD-10-CM

## 2024-03-30 DIAGNOSIS — N920 Excessive and frequent menstruation with regular cycle: Secondary | ICD-10-CM | POA: Diagnosis not present

## 2024-03-30 MED ORDER — SODIUM CHLORIDE 0.9 % IV SOLN
510.0000 mg | Freq: Once | INTRAVENOUS | Status: AC
  Administered 2024-03-30: 510 mg via INTRAVENOUS
  Filled 2024-03-30: qty 17

## 2024-03-30 NOTE — Progress Notes (Signed)
 Diagnosis: Iron  Deficiency Anemia  Provider:  Praveen Mannam MD  Procedure: IV Infusion  IV Type: Peripheral, IV Location: R Antecubital  Feraheme  (Ferumoxytol ), Dose: 510 mg  Infusion Start Time: 1510  Infusion Stop Time: 1528  Post Infusion IV Care: Observation period completed and Peripheral IV Discontinued  Discharge: Condition: Good, Destination: Home . AVS Declined  Performed by:  Leita FORBES Miles, LPN

## 2024-04-02 ENCOUNTER — Ambulatory Visit (HOSPITAL_COMMUNITY)
Admission: RE | Admit: 2024-04-02 | Discharge: 2024-04-02 | Disposition: A | Discharge status: Home / Self Care | Source: Ambulatory Visit | Attending: Interventional Radiology | Admitting: Interventional Radiology

## 2024-04-02 DIAGNOSIS — I6521 Occlusion and stenosis of right carotid artery: Secondary | ICD-10-CM | POA: Diagnosis present

## 2024-04-02 MED ORDER — IOHEXOL 350 MG/ML SOLN
75.0000 mL | Freq: Once | INTRAVENOUS | Status: AC | PRN
  Administered 2024-04-02: 75 mL via INTRAVENOUS

## 2024-04-02 MED ORDER — SODIUM CHLORIDE (PF) 0.9 % IJ SOLN
INTRAMUSCULAR | Status: AC
  Filled 2024-04-02: qty 50

## 2024-04-05 ENCOUNTER — Other Ambulatory Visit: Payer: Self-pay | Admitting: Nurse Practitioner

## 2024-04-05 DIAGNOSIS — I10 Essential (primary) hypertension: Secondary | ICD-10-CM

## 2024-04-05 DIAGNOSIS — E785 Hyperlipidemia, unspecified: Secondary | ICD-10-CM

## 2024-04-05 DIAGNOSIS — I6521 Occlusion and stenosis of right carotid artery: Secondary | ICD-10-CM

## 2024-04-06 ENCOUNTER — Other Ambulatory Visit (HOSPITAL_COMMUNITY): Payer: Self-pay | Admitting: Interventional Radiology

## 2024-04-06 DIAGNOSIS — I6521 Occlusion and stenosis of right carotid artery: Secondary | ICD-10-CM

## 2024-04-10 ENCOUNTER — Ambulatory Visit (HOSPITAL_COMMUNITY)
Admission: RE | Admit: 2024-04-10 | Discharge: 2024-04-10 | Disposition: A | Discharge status: Home / Self Care | Source: Ambulatory Visit | Attending: Interventional Radiology | Admitting: Interventional Radiology

## 2024-04-10 DIAGNOSIS — I6521 Occlusion and stenosis of right carotid artery: Secondary | ICD-10-CM

## 2024-04-13 ENCOUNTER — Other Ambulatory Visit (HOSPITAL_COMMUNITY): Payer: Self-pay | Admitting: Interventional Radiology

## 2024-04-13 ENCOUNTER — Other Ambulatory Visit (HOSPITAL_COMMUNITY): Payer: Self-pay

## 2024-04-13 ENCOUNTER — Other Ambulatory Visit (HOSPITAL_COMMUNITY)
Admission: RE | Admit: 2024-04-13 | Discharge: 2024-04-13 | Disposition: A | Discharge status: Home / Self Care | Attending: Interventional Radiology | Admitting: Interventional Radiology

## 2024-04-13 DIAGNOSIS — I771 Stricture of artery: Secondary | ICD-10-CM

## 2024-04-13 HISTORY — PX: IR RADIOLOGIST EVAL & MGMT: IMG5224

## 2024-04-13 LAB — PLATELET INHIBITION P2Y12: Platelet Function  P2Y12: 154 [PRU] — ABNORMAL LOW (ref 182–335)

## 2024-04-13 NOTE — H&P (Deleted)
   The note originally documented on this encounter has been moved the the encounter in which it belongs.

## 2024-04-13 NOTE — H&P (Signed)
 Chief Complaint: S/p revascularization of symptomatic high grade stenosis of proximal cavernous right ICA and stent assisted angioplasty of distal petrous segment 10/16/23 Dr. Dolphus with new right sided headaches and dizziness - diagnostic catheter arteriogram with intent to treat   Supervising Physician: Dolphus Carrion  Patient Status: [name] - Out-pt  History of Present Illness: [name] is a 43 y.o. female with history of fibroids, heart murmur, anemia, chron's disease, anxiety, hypertension, and ADD.  Pt is s/p revascularization of symptomatic high grade stenosis of proximal cavernous right ICA and stent assisted angioplasty of distal petrous segment 10/16/23 with Dr. Dolphus.  Pt began having symptoms of dizziness and right sided headaches which prompted her to call.  Dr. Dolphus recommended CT Angio Head and Neck which was performed 04/10/24 revealing faint contrast enhancement just distal to the previously positioned stent in the distal petrous segment, questioning the intra stent stenosis persists worsening atherosclerotic disease distal to this. Following further discussions, in order to ascertain adequately the status of the right ICA in the treated region, a formal diagnostic catheter arteriogram with intent to treat was recommended. Pt is in agreement to move forward with diagnostic catheter arteriogram with intent to treat and scheduled for 04/15/24.     Patient is Full Code  Past Medical History:  Diagnosis Date   ADD (attention deficit disorder)    Anemia    Anxiety    Crohn's disease (HCC)    D-dimer, elevated    Fibroid    Headache(784.0)    otc meds - last migraine 2009   Heart murmur    as child, never had any problems   History of endometriosis    Hypertension    Leiomyoma 02/09/2015   Low blood potassium    history   Migraine without aura, with intractable migraine, so stated, without mention of status migrainosus 06/19/2013   Pneumonia    x5    PONV (postoperative nausea and vomiting)    Seasonal allergies    Vaginal Pap smear, abnormal    Vasovagal syncope     Past Surgical History:  Procedure Laterality Date   ADENOIDECTOMY  2014   CESAREAN SECTION N/A 04/14/2018   Procedure: PRIMARY CESAREAN SECTION;  Surgeon: Edsel Norleen GAILS, MD;  Location: [facility] BIRTHING SUITES;  Service: Obstetrics;  Laterality: N/A;   DILATATION & CURRETTAGE/HYSTEROSCOPY WITH RESECTOCOPE N/A 06/09/2013   Procedure: DILATATION & CURETTAGE/HYSTEROSCOPY WITH HYSTEROSCOPIC RESECTION OF SUBMUCOSAL FIBROID AND ENDOMETRIAL POLYP;  Surgeon: Dickie DELENA Carder, MD;  Location: [facility];  Service: Gynecology;  Laterality: N/A;   IR ANGIO INTRA EXTRACRAN SEL COM CAROTID INNOMINATE BILAT MOD SED  09/30/2023   IR ANGIO INTRA EXTRACRAN SEL INTERNAL CAROTID UNI R MOD SED  10/16/2023   IR ANGIO VERTEBRAL SEL SUBCLAVIAN INNOMINATE UNI L MOD SED  09/30/2023   IR ANGIO VERTEBRAL SEL VERTEBRAL UNI R MOD SED  09/30/2023   IR CT HEAD LTD  10/16/2023   IR INTRA CRAN STENT  10/16/2023   IR RADIOLOGIST EVAL & MGMT  09/30/2023   IR RADIOLOGIST EVAL & MGMT  10/03/2023   IR RADIOLOGIST EVAL & MGMT  10/31/2023   IR RADIOLOGIST EVAL & MGMT  04/13/2024   IR US  GUIDE VASC ACCESS RIGHT  09/30/2023   LAPAROSCOPY N/A 06/09/2013   Procedure: LAPAROSCOPY DIAGNOSTIC;  Surgeon: Dickie DELENA Carder, MD;  Location: [facility];  Service: Gynecology;  Laterality: N/A;   RADIOLOGY WITH ANESTHESIA N/A 10/16/2023   Procedure: endovascular treatment of the right internal  carotid artery tandem intracranial stenosis;  Surgeon: Dolphus Carrion, MD;  Location: [facility] OR;  Service: Radiology;  Laterality: N/A;   ROBOT ASSISTED MYOMECTOMY N/A 02/09/2015   Procedure: MYOMECTOMY,EXCISION OF ENDOMETRIOSIS,PRE-SACRAL NEURECTOMY;  Surgeon: Cynthia Loss, MD;  Location: [facility];  Service: Gynecology;  Laterality: N/A;   ROBOTIC ASSISTED LAPAROSCOPIC LYSIS OF ADHESION N/A 06/09/2013   Procedure: ROBOTIC ASSISTED  LAPAROSCOPIC LYSIS OF ADHESION; Resection of Endometriosis and excision of fibroid;  Surgeon: Dickie DELENA Carder, MD;  Location: [facility];  Service: Gynecology;  Laterality: N/A;   TONSILLECTOMY  2014   TYMPANOSTOMY TUBE PLACEMENT  2014    Allergies: Chlorhexidine   Medications: Prior to Admission medications   Medication Sig Start Date End Date Taking? Authorizing Provider  acetaminophen  (TYLENOL ) 500 MG tablet Take 1,000 mg by mouth every 6 (six) hours as needed for mild pain (pain score 1-3) or headache.    [provider]  ALPRAZolam  (XANAX ) 0.5 MG tablet Take 0.5 mg by mouth at bedtime as needed for anxiety. Patient not taking: Reported on 03/24/2024 10/10/23   [provider]  amLODipine  (NORVASC ) 5 MG tablet TAKE 1 TABLET (5 MG TOTAL) BY MOUTH DAILY. 04/06/24   Paseda, Folashade R, FNP  aspirin  81 MG chewable tablet Chew 1 tablet (81 mg total) by mouth daily. 09/13/23   Rancour, Garnette, MD  atorvastatin  (LIPITOR) 20 MG tablet TAKE 1 TABLET BY MOUTH EVERY DAY 04/06/24   Paseda, Folashade R, FNP  Beclomethasone Dipropionate  (QNASL ) 80 MCG/ACT AERS Place 2 sprays into the nose daily. Patient not taking: Reported on 03/24/2024 12/23/23   Mayer, Jodi R, NP  cetirizine  (ZYRTEC ) 10 MG tablet TAKE 1 TABLET BY MOUTH EVERY DAY 02/18/24   Paseda, Folashade R, FNP  clopidogrel  (PLAVIX ) 75 MG tablet Take 1 tablet (75 mg total) by mouth daily. 02/11/24   Pommier, Wyatt H, PA-C  OVER THE COUNTER MEDICATION Take 1 tablet by mouth. Calcium  supplement daily--unknown dose    [provider]  polyethylene glycol (MIRALAX  / GLYCOLAX ) 17 g packet Take 17 g by mouth daily. 11/08/23   Franchot Novel, MD  Saline 0.65 % SOLN Place 1 spray into the nose daily. Patient not taking: Reported on 01/27/2024    [provider]     Family History  Problem Relation Age of Onset   Heart disease Mother 86       CHF   Crohn's disease Mother    Asthma Mother    Asthma Brother    Heart  disease Maternal Aunt    Diabetes Maternal Aunt    Stroke Maternal Aunt    Heart disease Maternal Grandfather    Stroke Maternal Grandfather     Social History   Socioeconomic History   Marital status: Divorced    Spouse name: Not on file   Number of children: 2   Years of education: some coll.   Highest education level: Not on file  Occupational History   Not on file  Tobacco Use   Smoking status: Never   Smokeless tobacco: Never  Vaping Use   Vaping status: Never Used  Substance and Sexual Activity   Alcohol use: Yes    Comment: occas   Drug use: No   Sexual activity: Yes    Birth control/protection: None  Other Topics Concern   Not on file  Social History Narrative   Patient does not drink caffeine.   Patient is right handed.    Social Drivers of Corporate investment banker Strain: Not  on file  Food Insecurity: No Food Insecurity (11/05/2023)   Hunger Vital Sign    Worried About Running Out of Food in the Last Year: Never true    Ran Out of Food in the Last Year: Never true  Transportation Needs: No Transportation Needs (11/05/2023)   PRAPARE - Administrator, Civil Service (Medical): No    Lack of Transportation (Non-Medical): No  Physical Activity: Not on file  Stress: Not on file  Social Connections: Not on file     Review of Systems: A 12 point ROS discussed and pertinent positives are indicated in the HPI above.  All other systems are negative.  Review of Systems  Constitutional:  Negative for chills, fatigue and fever.  Respiratory:  Negative for cough, shortness of breath and wheezing.   Gastrointestinal:  Negative for diarrhea, nausea and vomiting.  Neurological:  Negative for dizziness and headaches.  Psychiatric/Behavioral:  Negative for agitation, behavioral problems and confusion.     Vital Signs: LMP 04/12/2024   Advance Care Plan: The advanced care place/surrogate decision maker was discussed at the time of visit and the  patient did not wish to discuss or was not able to name a surrogate decision maker or provide an advance care plan.  Physical Exam Constitutional:      Appearance: She is well-developed.  HENT:     Head: Atraumatic.     Mouth/Throat:     Mouth: Mucous membranes are moist.  Cardiovascular:     Rate and Rhythm: Normal rate and regular rhythm.     Heart sounds: No murmur heard. Pulmonary:     Effort: Pulmonary effort is normal.     Breath sounds: Normal breath sounds.  Abdominal:     General: Bowel sounds are normal.     Palpations: Abdomen is soft.  Musculoskeletal:        General: Normal range of motion.  Skin:    General: Skin is warm.  Neurological:     Mental Status: She is alert and oriented to person, place, and time.  Psychiatric:        Mood and Affect: Mood normal.        Behavior: Behavior normal.     Imaging: IR Radiologist Eval & Mgmt Result Date: 04/13/2024 EXAM: ESTABLISHED PATIENT OFFICE VISIT CHIEF COMPLAINT: 43 year old right handed lady who presents in follow-up to discuss the findings of her most recent CT angiogram of the head and neck. Current Pain Level: 1-10 HISTORY OF PRESENT ILLNESS: The patient underwent uneventful endovascular revascularization of symptomatic high-grade stenosis of the proximal cavernous right ICA and stent assisted angioplasty of the distal petrous segment on 10/16/2023. There were no periprocedural complications. The patient had a follow-up CT angiogram of the head and neck on 11/04/2023 which revealed a 3 mm filling defect questionable thrombus in the right supraclinoid ICA with questionable stenosis of the cavernous ICA just distal to the stent. Her most recent CT angiogram of 04/02/2024 suggests occlusion or near occlusion of the cavernous segment of the right internal carotid artery as per the radiology report. Clinically, the patient reports episodic sudden transient sharp pain in the right retro-orbital and supraorbital regions. During  this time the patient reports no changes in her vision or double vision. She reports this at a frequency of 1-2 per day. Additionally, the patient reports a right-sided orbital frontal and frontal headache which she has 5-6 per week. She reports no neurological features of blindness, double vision or amaurosis fugax, or of  left-sided numbness, tingling, weakness or incoordination. She reports no recent symptoms of loss of consciousness, or of seizure-like activity. These headaches are relieved significantly with Tylenol . She remains functional in terms of her daily routine activities including work. Denies any chest pain, shortness of breath, palpitations or pedal edema. Breathing is clear without wheezing or coughing. Appetite is normal. Weight is steady. No abdominal pain, constipation, diarrhea or melena. No abnormal urinary symptoms. No recent chills, fever or rigors. Diagnosis * : Date . * : ADD (attention deficit disorder) * : . * : Anemia * : . * : Anxiety * : . * : Crohn's disease (HCC) * : . * : D-dimer, elevated * : . * : Fibroid * : . * : Headache(784.0) * : * : otc meds - last migraine 2009 . * : Heart murmur * : * : as child, never had any problems . * : History of endometriosis * : . * : Hypertension * : . * : Leiomyoma * : 02/09/2015 . * : Low blood potassium * : * : history . * : Migraine without aura, with intractable migraine, so stated, without mention of status migrainosus * : 06/19/2013 . * : Pneumonia * : * : x5 . * : PONV (postoperative nausea and vomiting) * : . * : Seasonal allergies * : . * : Vaginal Pap smear, abnormal * : . * : Vasovagal syncope * : : Past Surgical History: Procedure * : Laterality * : Date . * : ADENOIDECTOMY * : * : 2014 . * : CESAREAN SECTION * : N/A * : 04/14/2018 * : Procedure: PRIMARY CESAREAN SECTION; Surgeon: Edsel Norleen GAILS, MD; Location: [facility]IRTHING SU[facility]; Service: Obstetrics; Laterality: N/A; . * : DILATATION & CURRETTAGE/HYSTEROSCOPY WITH RESECTOCOPE * : N/A  * : 06/09/2013 * : Procedure: DILATATION & CURETTAGE/HYSTEROSCOPY WITH HYSTEROSCOPIC RESECTION OF SUBMUCOSAL FIBROID AND ENDOMETRIAL POLYP; Surgeon: Dickie DELENA Carder, MD; Location: [facility]; Service: Gynecology; Laterality: N/A; . * : IR ANGIO INTRA EXTRACRAN SEL COM CAROTID INNOMINATE BILAT MOD SED * : * : 09/30/2023 . * : IR ANGIO VERTEBRAL SEL SUBCLAVIAN INNOMINATE UNI L MOD SED * : * : 09/30/2023 . * : IR ANGIO VERTEBRAL SEL VERTEBRAL UNI R MOD SED * : * : 09/30/2023 . * : IR RADIOLOGIST EVAL & MGMT * : * : 09/30/2023 . * : IR RADIOLOGIST EVAL & MGMT * : * : 10/03/2023 . * : IR US  GUIDE VASC ACCESS RIGHT * : * : 09/30/2023 . * : LAPAROSCOPY * : N/A * : 06/09/2013 * : Procedure: LAPAROSCOPY DIAGNOSTIC; Surgeon: Dickie DELENA Carder, MD; Location: [facility]; Service: Gynecology; Laterality: N/A; . * : ROBOT ASSISTED MYOMECTOMY * : N/A * : 02/09/2015 * : Procedure: MYOMECTOMY,EXCISION OF ENDOMETRIOSIS,PRE-SACRAL NEURECTOMY; Surgeon: Cynthia Loss, MD; Location: [facility]; Service: Gynecology; Laterality: N/A; . * : ROBOTIC ASSISTED LAPAROSCOPIC LYSIS OF ADHESION * : N/A * : 06/09/2013 * : Procedure: ROBOTIC ASSISTED LAPAROSCOPIC LYSIS OF ADHESION; Resection of Endometriosis and excision of fibroid; Surgeon: Dickie DELENA Carder, MD; Location: [facility]; Service: Gynecology; Laterality: N/A; . * : TONSILLECTOMY * : * : 2014 . * : TYMPANOSTOMY TUBE PLACEMENT * : * : 2014 Allergies: Patient has no known allergies. Medications: Prior to Admission medications Medication * : Sig * : Start Date * : End Date * : Taking? * : Authorizing Provider acetaminophen  (TYLENOL ) 325 MG tablet * : Take 2 tablets (650 mg total) by mouth every 4 (  four) hours as needed (for pain scale < 4).Patient not taking: Reported on 10/15/2023 * : 04/17/18 * : * : * : Izell Harari, MD ALPRAZolam  (XANAX ) 0.5 MG tablet * : Take 0.5 mg by mouth at bedtime. * : 10/10/23 * : * : * : [provider] amLODipine  (NORVASC ) 5 MG tablet * : Take 1 tablet  (5 mg total) by mouth daily. * : 10/14/23 * : * : * : Paseda, Folashade R, FNP amphetamine-dextroamphetamine (ADDERALL) 30 MG tablet * : Take 30 mg by mouth daily.Patient not taking: Reported on 10/14/2023 * : * : * : * : [provider] aspirin  81 MG chewable tablet * : Chew 1 tablet (81 mg total) by mouth daily. * : 09/13/23 * : * : * BETHA Carita Senior, MD atorvastatin  (LIPITOR) 20 MG tablet * : Take 1 tablet (20 mg total) by mouth daily. * : 10/15/23 * : 10/14/24 * : * : Paseda, Folashade R, FNP cetirizine  (ZYRTEC  ALLERGY) 10 MG tablet * : Take 1 tablet (10 mg total) by mouth daily. * : 10/14/23 * : 04/11/24 * : * : Paseda, Folashade R, FNP clopidogrel  (PLAVIX ) 75 MG tablet * : Take 1 tablet (75 mg total) by mouth daily. * : 10/03/23 * : * : * : Bruning, Kevin, PA-C ELDERBERRY PO * : Take 2 tablets by mouth daily at 2 am. Gummy * : * : * : * : [provider] gabapentin  (NEURONTIN ) 100 MG capsule * : Take 1 capsule (100 mg total) by mouth 3 (three) times daily.Patient not taking: Reported on 10/14/2023 * : 09/28/23 * : * : * : Lang Norleen POUR, PA-C lidocaine  (LIDODERM ) 5 % * : Place 1 patch onto the skin daily. Remove & Discard patch within 12 hours or as directed by MDPatient not taking: Reported on 10/14/2023 * : 09/28/23 * : * : * : Jerral Meth, MD ondansetron  (ZOFRAN -ODT) 4 MG disintegrating tablet * : Take 1 tablet (4 mg total) by mouth every 8 (eight) hours as needed for nausea or vomiting.Patient not taking: Reported on 10/14/2023 * : 09/15/23 * : * : * : Kommor, Madison, MD predniSONE  (DELTASONE ) 10 MG tablet * : Take 5 tablets (50 mg total) by mouth daily for 3 days, THEN 4 tablets (40 mg total) daily for 3 days, THEN 3 tablets (30 mg total) daily for 3 days, THEN 2 tablets (20 mg total) daily for 3 days, THEN 1 tablet (10 mg total) daily for 3 days, THEN 0.5 tablets (5 mg total) daily for 3 days.Patient not taking: Reported on 10/15/2023 * : 09/28/23 * : 10/16/23 * : * : Jerral Meth, MD  Saline 0.65 % SOLN * : Place 1 spray into the nose daily. * : * : * : * : [provider] Family History Problem * : Relation * : Age of Onset . * : Heart disease * : Mother * : 24 * :     CHF . * : Crohn's disease * : Mother * : . * : Asthma * : Mother * : . * : Asthma * : Brother * : . * : Heart disease * : Maternal Aunt * : . * : Diabetes * : Maternal Aunt * : . * : Stroke * : Maternal Aunt * : . * : Heart disease * : Maternal Grandfather * : . * : Stroke * : Maternal Grandfather * : Socioeconomic History . * :  Marital status: * : Divorced * : * : Spouse name: * : Not on file . * : Number of children: * : 2 . * : Years of education: * : some coll. . * : Highest education level: * : Not on file Occupational History . * : Not on file Tobacco Use . * : Smoking status: * : Never . * : Smokeless tobacco: * : Never Vaping Use . * : Vaping status: * : Never Used Substance and Sexual Activity . * : Alcohol use: * : Not Currently . * : Drug use: * : No . * : Sexual activity: * : Yes * : * : Birth control/protection: * : None Other Topics * : Concern . * : Not on file Social History Narrative * : Patient does not drink caffeine. * : Patient is right handed. Food Insecurity: Not on file Transportation Needs: Not on file Physical Activity: Not on file Stress: Not on file Social Connections: Not on file Review of Systems: A 12 point ROS discussed and pertinent positives are indicated in the HPI above. All other systems are negative. Review of Systems Constitutional: Negative for activity change, fatigue and fever. HENT: Negative for sneezing, sore throat and tinnitus. Eyes: Negative for visual disturbance. Respiratory: Negative for cough and shortness of breath. Cardiovascular: Negative for chest pain. Gastrointestinal: Negative for abdominal pain, diarrhea, nausea and vomiting. Musculoskeletal: Negative for back pain and gait problem. Neurological: Negative for dizziness, tremors, seizures, syncope, facial  asymmetry, speech difficulty, weakness, light-headedness, numbness and headaches. Psychiatric/Behavioral: Negative for behavioral problems and confusion. Social History: No change. Family History:  No change. REVIEW OF SYSTEMS: Negative unless as mentioned above. PHYSICAL EXAMINATION: Alert, awake, oriented to time, place, space. Speech and comprehension intact. No abnormal lateralizing neurological features. Station and gait normal. ASSESSMENT AND PLAN: Most recent CT angiogram of the head and neck were reviewed with the patient. Brought to her attention was the faint contrast enhancement just distal to the previously positioned stent in the distal petrous segment. Question was the intra stent stenosis persists worsening atherosclerotic disease distal to this. Following further discussions, in order to ascertain adequately the status of the right ICA in the treated region, a formal diagnostic catheter arteriogram would be appropriate. The patient is familiar with this procedure. Should a high-grade stenosis be discovered, endovascular treatment with balloon angioplasty would be the viable option. The patient is aware of the risks of the procedure of 1-2% chance of new ischemic stroke, renal dysfunction related to contrast media, infection, bleeding, hematoma at the access site but not limited to these. The patient would like to proceed with a diagnostic catheter arteriogram with intent to treat under anesthesia. Hopefully this can be scheduled as soon as possible. Should the patient develop stroke-like symptoms, patient advised to call 911. Patient also advised to continue all her medications, and maintain adequate hydration. She leaves with good understanding and agreement with the above management plan. Electronically Signed   By: Thyra Nash M.D.   On: 04/13/2024 08:58   CT ANGIO HEAD NECK W WO CM Result Date: 04/02/2024 CLINICAL DATA:  Follow-up carotid thrombus EXAM: CT ANGIOGRAPHY HEAD AND NECK WITH  AND WITHOUT CONTRAST TECHNIQUE: Multidetector CT imaging of the head and neck was performed using the standard protocol during bolus administration of intravenous contrast. Multiplanar CT image reconstructions and MIPs were obtained to evaluate the vascular anatomy. Carotid stenosis measurements (when applicable) are obtained utilizing NASCET criteria, using the distal internal carotid diameter  as the denominator. RADIATION DOSE REDUCTION: This exam was performed according to the departmental dose-optimization program which includes automated exposure control, adjustment of the mA and/or kV according to patient size and/or use of iterative reconstruction technique. CONTRAST:  75mL OMNIPAQUE  IOHEXOL  350 MG/ML SOLN COMPARISON:  November 04, 2023 FINDINGS: CT Head: There is no hemorrhage. No acute ischemic changes. No mass lesion. The ventricles are normal. Skull/sinuses/orbits: No significant abnormality. CTA NECK: CTA NECK Aortic arch: No proximal vessel stenosis. Right carotid: The carotid bifurcation is normal. The cervical internal carotid artery is relatively small. Left carotid: Normal Right vertebral: Normal Left vertebral: Normal Soft tissues: No significant abnormality Other comments: None CTA HEAD: CTA HEAD Right anterior circulation: There is a nearly occlusive stenosis of the cavernous segment of the right internal carotid artery. The thrombus noted in the carotid terminus on the prior study has resolved. The a 1 segment is small. The anterior cerebral artery and middle cerebral artery are patent without proximal branch occlusion Left anterior circulation: The internal carotid artery is patent without significant stenosis. The anterior and middle cerebral arteries are patent without significant stenosis or proximal branch occlusion. No aneurysm. Both anterior cerebral arteries arise predominantly from the left side. Posterior circulation: Both vertebral arteries are patent. There is no significant basilar  stenosis. Both posterior cerebral arteries are patent without significant stenosis or proximal branch occlusion. No aneurysm. IMPRESSION: 1. No extracranial carotid artery stenosis 2. Occlusion or near occlusion within the cavernous segment of the right internal carotid artery 3. The thrombus noted in the right carotid terminus on the prior study has resolved Electronically Signed   By: Nancyann Burns M.D.   On: 04/02/2024 12:31    Labs:  CBC: Recent Labs    11/06/23 0839 11/07/23 0330 01/27/24 1509 04/15/24 0627  WBC 4.6 4.9 6.9 7.5  HGB 11.7* 12.0 12.5 13.0  HCT 34.3* 35.4* 38.0 38.8  PLT 197 222 276 241    COAGS: Recent Labs    09/30/23 0808 10/16/23 0720 10/16/23 1107 04/15/24 0627  INR 1.0 1.0 1.2 1.0    BMP: Recent Labs    11/05/23 0405 11/06/23 0839 11/07/23 0330 11/22/23 0828 12/26/23 0843 04/15/24 0627  NA 138 137 136 143  --  139  K 2.9* 3.5 3.3* 3.5  --  3.1*  CL 105 101 105 107*  --  102  CO2 21* 23 24 22   --  27  GLUCOSE 106* 109* 117* 91  --  99  BUN 9 8 6 13   --  9  CALCIUM  8.0* 8.3* 8.4* 8.3* 8.6* 8.9  CREATININE 0.65 0.68 0.77 0.67  --  0.72  GFRNONAA >60 >60 >60  --   --  >60    LIVER FUNCTION TESTS: Recent Labs    09/13/23 0215 09/28/23 0846  BILITOT 0.6 0.4  AST 14* 15  ALT 15 14  ALKPHOS 80 82  PROT 6.9 7.3  ALBUMIN 3.4* 3.7    TUMOR MARKERS: No results for input(s): AFPTM, CEA, CA199, CHROMGRNA in the last 8760 hours.  Assessment and Plan:  Pt is s/p revascularization of symptomatic high grade stenosis of proximal cavernous right ICA and stent assisted angioplasty of distal petrous segment 10/16/23 Dr. Dolphus with new right sided headaches and dizziness scheduled for diagnostic catheter arteriogram with intent to treat 04/15/24.    Risks and benefits of diagnostic catheter arteriogram with intent to treat were discussed with the patient including, but not limited to bleeding, infection, vascular injury or contrast  induced renal failure.  This interventional procedure involves the use of X-rays and because of the nature of the planned procedure, it is possible that we will have prolonged use of X-ray fluoroscopy.  Potential radiation risks to you include (but are not limited to) the following: - A slightly elevated risk for cancer  several years later in life. This risk is typically less than 0.5% percent. This risk is low in comparison to the normal incidence of human cancer, which is 33% for women and 50% for men according to the [employer] Society. - Radiation induced injury can include skin redness, resembling a rash, tissue breakdown / ulcers and hair loss (which can be temporary or permanent).   The likelihood of either of these occurring depends on the difficulty of the procedure and whether you are sensitive to radiation due to previous procedures, disease, or genetic conditions.   IF your procedure requires a prolonged use of radiation, you will be notified and given written instructions for further action.  It is your responsibility to monitor the irradiated area for the 2 weeks following the procedure and to notify your physician if you are concerned that you have suffered a radiation induced injury.    All of the patient's questions were answered, patient is agreeable to proceed.  Consent signed and in chart.  Thank you for allowing our service to participate in RAYNESHA TIEDT 's care.  Electronically Signed: Lavanda JAYSON Jurist, PA-C   04/15/2024, 7:38 AM    I spent a total of    25 Minutes in face to face in clinical consultation, greater than 50% of which was counseling/coordinating care for diagnostic catheter arteriogram with intent to treat.

## 2024-04-14 ENCOUNTER — Other Ambulatory Visit: Payer: Self-pay | Admitting: Student

## 2024-04-14 ENCOUNTER — Encounter (HOSPITAL_COMMUNITY): Payer: Self-pay | Admitting: Interventional Radiology

## 2024-04-14 ENCOUNTER — Other Ambulatory Visit: Payer: Self-pay

## 2024-04-14 DIAGNOSIS — I6521 Occlusion and stenosis of right carotid artery: Secondary | ICD-10-CM

## 2024-04-14 NOTE — Progress Notes (Signed)
 Anesthesia Chart Review: Diane [name]  Case: [id number] Date/Time: 04/15/24 0815   Procedure: RADIOLOGY WITH ANESTHESIA - Diagnostic angiogram w/intent to treat   Anesthesia type: General   Diagnosis: Stenosis of artery (HCC) [I77.1]   Pre-op diagnosis: stenosis   [facility]   Surgeons: Dolphus Carrion, MD       DISCUSSION: Patient is a 43 year old female scheduled for the above procedure.  History of severe high-grade tandem stenosis of the right internal carotid artery intracranially. S/p balloon angioplasty of high-grade stenosis of the proximal cavernous right ICA, and stent assisted angioplasty of the proximal distal petrous segment intracranial ICA using a 2.75 mm x 12 mm Frontier balloon mounted stent on 10/16/23. Follow-up CTA on 11/04/23 revealed a 3 mm filling defect with questionable thrombus and stenosis of cavernous ICD just distal to the stent. Most recent CTA on 04/02/24 suggested occlusion or near occlusion of the cavernous segment of the right internal carotid artery. She had follow-up with Dr. Dolphus to discussion options with plan to proceed with a diagnostic catheter arteriogram with intent to treat under anesthesia.   Other history includes never smoker, post-operative N/V, HTN, Crohn's disease, childhood murmur (mild MR 01/20/08), migraines, ADD, leiomyoma (s/p robot assisted myomectomy (02/09/15).   She is on ASA and Plavix .   Anesthesia team to evaluate on the day of procedure.   VS:  Wt Readings from Last 3 Encounters:  03/30/24 96.8 kg  03/20/24 95.4 kg  03/03/24 96.3 kg   BP Readings from Last 3 Encounters:  03/30/24 133/83  03/24/24 (!) 141/72  03/20/24 135/82   Pulse Readings from Last 3 Encounters:  03/30/24 80  03/24/24 74  03/20/24 71     PROVIDERS: Paseda, Folashade R, FNP is PCP    LABS: For day of surgery as indicated. Most recent results include: Lab Results  Component Value Date   WBC 6.9 01/27/2024    HGB 12.5 01/27/2024   HCT 38.0 01/27/2024   PLT 276 01/27/2024   GLUCOSE 91 11/22/2023   CHOL 110 11/22/2023   TRIG 39 11/22/2023   HDL 49 11/22/2023   LDLDIRECT 135 (H) 10/14/2023   LDLCALC 51 11/22/2023   ALT 14 09/28/2023   AST 15 09/28/2023   NA 143 11/22/2023   K 3.5 11/22/2023   CL 107 (H) 11/22/2023   CREATININE 0.67 11/22/2023   BUN 13 11/22/2023   CO2 22 11/22/2023   TSH 0.86 10/22/2011   INR 1.2 10/16/2023   HGBA1C 5.1 10/14/2023   P2y12 154 on 04/13/24.    IMAGES: CT Head & CTA Head/Neck 04/02/24: IMPRESSION: 1. No extracranial carotid artery stenosis 2. Occlusion or near occlusion within the cavernous segment of the right internal carotid artery 3. The thrombus noted in the right carotid terminus on the prior study has resolved   EKG: 11/06/23: Sinus tachycardia at 112 bpm Nonspecific ST and T wave abnormality Abnormal ECG When compared with ECG of 04-Nov-2023 13:40, QT has shortened (QT 340 ms, QTcB 464 ms) Confirmed by End, Christopher [phone]) on 11/06/2023 12:40:16 PM   CV: Echo (Limited) 11/07/23 (during during admission for influenza A with ST): IMPRESSIONS   1. Left ventricular ejection fraction, by estimation, is 60 to 65%. The  left ventricle has normal function.   2. Right ventricular systolic function is normal. The right ventricular  size is normal.   3. A small pericardial effusion is present.   4. The mitral valve is grossly normal. Trivial mitral valve  regurgitation.   5. The aortic valve is grossly normal.   6. The inferior vena cava is normal in size with greater than 50%  respiratory variability, suggesting right atrial pressure of 3 mmHg.  - Comparison 01/20/08: LVEF 55-60^, no RWMA, mild MR, minimal pericardial effusion  Past Medical History:  Diagnosis Date   ADD (attention deficit disorder)    Anemia    Anxiety    Crohn's disease (HCC)    D-dimer, elevated    Fibroid    Headache(784.0)    otc meds - last migraine 2009    Heart murmur    as child, never had any problems   History of endometriosis    Hypertension    Leiomyoma 02/09/2015   Low blood potassium    history   Migraine without aura, with intractable migraine, so stated, without mention of status migrainosus 06/19/2013   Pneumonia    x5   PONV (postoperative nausea and vomiting)    Seasonal allergies    Vaginal Pap smear, abnormal    Vasovagal syncope     Past Surgical History:  Procedure Laterality Date   ADENOIDECTOMY  2014   CESAREAN SECTION N/A 04/14/2018   Procedure: PRIMARY CESAREAN SECTION;  Surgeon: Edsel Norleen GAILS, MD;  Location: [facility] BIRTHING SUITES;  Service: Obstetrics;  Laterality: N/A;   DILATATION & CURRETTAGE/HYSTEROSCOPY WITH RESECTOCOPE N/A 06/09/2013   Procedure: DILATATION & CURETTAGE/HYSTEROSCOPY WITH HYSTEROSCOPIC RESECTION OF SUBMUCOSAL FIBROID AND ENDOMETRIAL POLYP;  Surgeon: Dickie DELENA Carder, MD;  Location: [facility] ORS;  Service: Gynecology;  Laterality: N/A;   IR ANGIO INTRA EXTRACRAN SEL COM CAROTID INNOMINATE BILAT MOD SED  09/30/2023   IR ANGIO INTRA EXTRACRAN SEL INTERNAL CAROTID UNI R MOD SED  10/16/2023   IR ANGIO VERTEBRAL SEL SUBCLAVIAN INNOMINATE UNI L MOD SED  09/30/2023   IR ANGIO VERTEBRAL SEL VERTEBRAL UNI R MOD SED  09/30/2023   IR CT HEAD LTD  10/16/2023   IR INTRA CRAN STENT  10/16/2023   IR RADIOLOGIST EVAL & MGMT  09/30/2023   IR RADIOLOGIST EVAL & MGMT  10/03/2023   IR RADIOLOGIST EVAL & MGMT  10/31/2023   IR RADIOLOGIST EVAL & MGMT  04/13/2024   IR US  GUIDE VASC ACCESS RIGHT  09/30/2023   LAPAROSCOPY N/A 06/09/2013   Procedure: LAPAROSCOPY DIAGNOSTIC;  Surgeon: Dickie DELENA Carder, MD;  Location: [facility] ORS;  Service: Gynecology;  Laterality: N/A;   RADIOLOGY WITH ANESTHESIA N/A 10/16/2023   Procedure: endovascular treatment of the right internal carotid artery tandem intracranial stenosis;  Surgeon: Dolphus Carrion, MD;  Location: [facility] OR;  Service: Radiology;  Laterality: N/A;   ROBOT ASSISTED  MYOMECTOMY N/A 02/09/2015   Procedure: MYOMECTOMY,EXCISION OF ENDOMETRIOSIS,PRE-SACRAL NEURECTOMY;  Surgeon: Cynthia Loss, MD;  Location: [facility] ORS;  Service: Gynecology;  Laterality: N/A;   ROBOTIC ASSISTED LAPAROSCOPIC LYSIS OF ADHESION N/A 06/09/2013   Procedure: ROBOTIC ASSISTED LAPAROSCOPIC LYSIS OF ADHESION; Resection of Endometriosis and excision of fibroid;  Surgeon: Dickie DELENA Carder, MD;  Location: [facility] ORS;  Service: Gynecology;  Laterality: N/A;   TONSILLECTOMY  2014   TYMPANOSTOMY TUBE PLACEMENT  2014    MEDICATIONS: No current facility-administered medications for this encounter.    acetaminophen  (TYLENOL ) 500 MG tablet   ALPRAZolam  (XANAX ) 0.5 MG tablet   amLODipine  (NORVASC ) 5 MG tablet   aspirin  81 MG chewable tablet   atorvastatin  (LIPITOR) 20 MG tablet   Beclomethasone Dipropionate  (QNASL ) 80 MCG/ACT AERS   cetirizine  (ZYRTEC ) 10 MG tablet   clopidogrel  (PLAVIX ) 75 MG tablet  OVER THE COUNTER MEDICATION   polyethylene glycol (MIRALAX  / GLYCOLAX ) 17 g packet   Saline 0.65 % SOLN    Khup Sapia, PA-C Surgical Short Stay/Anesthesiology [facility] Phone [phone] [facility] Phone [phone] 04/14/2024 1:30 PM

## 2024-04-14 NOTE — Progress Notes (Signed)
 SDW call  Patient was given pre-op instructions over the phone. Patient verbalized understanding of instructions provided.     PCP - Folashade Paseda, FNP Cardiologist -  Pulmonary:    PPM/ICD - denies Device Orders - na Rep Notified - na   Chest x-ray - 11/06/2023 EKG -  11/06/2023 Stress Test - ECHO - 11/06/2023 Cardiac Cath -   Sleep Study/sleep apnea/CPAP: denies  Non-diabetic  Blood Thinner Instructions: Plavix , continue Aspirin  Instructions: ASA, continue   ERAS Protcol - NPO   Anesthesia review: Yes. HTN, heart murmur,  carotid artery stenosis   Patient denies shortness of breath, fever, cough and chest pain over the phone call  Your procedure is scheduled on Wednesday April 15, 2024  Report to [facility] Main Entrance A at   0600 A.M., then check in with the Admitting office.  Call this number if you have problems the morning of surgery:  [phone]   If you have any questions prior to your surgery date call [phone]: Open Monday-Friday 8am-4pm If you experience any cold or flu symptoms such as cough, fever, chills, shortness of breath, etc. between now and your scheduled surgery, please notify us  at the above number    Remember:  Do not eat or drink after midnight the night before your surgery  Take these medicines the morning of surgery with A SIP OF WATER:  Amlodipine , zyrtec , ASA, plavix   As needed: Tylenol , xanax   As of today, STOP taking any Aleve , Naproxen , Ibuprofen , Motrin , Advil , Goody's, BC's, all herbal medications, fish oil, and all vitamins.

## 2024-04-14 NOTE — Anesthesia Preprocedure Evaluation (Addendum)
 Anesthesia Evaluation  Patient identified by MRN, date of birth, ID band Patient awake    Reviewed: Allergy & Precautions, NPO status , Patient's Chart, lab work & pertinent test results  History of Anesthesia Complications (+) PONV and history of anesthetic complications  Airway Mallampati: III  TM Distance: >3 FB Neck ROM: Full   Comment: Previous grade I view with MAC 3, easy mask Dental  (+) Dental Advisory Given,    Pulmonary neg pulmonary ROS   Pulmonary exam normal breath sounds clear to auscultation       Cardiovascular hypertension (amlodipine ), Pt. on medications (-) angina (-) Past MI, (-) Cardiac Stents and (-) CABG (-) dysrhythmias + Valvular Problems/Murmurs  Rhythm:Regular Rate:Normal  HLD, carotid artery stenosis   TTE 11/07/2023: IMPRESSIONS    1. Left ventricular ejection fraction, by estimation, is 60 to 65%. The  left ventricle has normal function.   2. Right ventricular systolic function is normal. The right ventricular  size is normal.   3. A small pericardial effusion is present.   4. The mitral valve is grossly normal. Trivial mitral valve  regurgitation.   5. The aortic valve is grossly normal.   6. The inferior vena cava is normal in size with greater than 50%  respiratory variability, suggesting right atrial pressure of 3 mmHg.     Neuro/Psych  Headaches, neg Seizures PSYCHIATRIC DISORDERS (ADD) Anxiety        GI/Hepatic Neg liver ROS,GERD  ,,Crohn's disease   Endo/Other  negative endocrine ROS    Renal/GU negative Renal ROS     Musculoskeletal   Abdominal  (+) + obese  Peds  Hematology negative hematology ROS (+) Lab Results      Component                Value               Date                      WBC                      6.9                 01/27/2024                HGB                      12.5                01/27/2024                HCT                      38.0                 01/27/2024                MCV                      87                  01/27/2024                PLT                      276  01/27/2024              Anesthesia Other Findings S/p revascularization of symptomatic high grade stenosis of proximal cavernous right ICA and stent assisted angioplasty of distal petrous segment 10/16/23 Dr. Dolphus with new right sided headaches and dizziness  Last Plavix : this morning  Reproductive/Obstetrics Fibroid                               Anesthesia Physical Anesthesia Plan  ASA: 3  Anesthesia Plan: General   Post-op Pain Management: Tylenol  PO (pre-op)*   Induction: Intravenous  PONV Risk Score and Plan: 4 or greater and Ondansetron , Dexamethasone , Midazolam  and Treatment may vary due to age or medical condition  Airway Management Planned: Oral ETT  Additional Equipment: Arterial line  Intra-op Plan:   Post-operative Plan: Extubation in OR  Informed Consent: I have reviewed the patients History and Physical, chart, labs and discussed the procedure including the risks, benefits and alternatives for the proposed anesthesia with the patient or authorized representative who has indicated his/her understanding and acceptance.     Dental advisory given  Plan Discussed with: CRNA and Anesthesiologist  Anesthesia Plan Comments: (PAT note written 04/14/2024 by Allison Zelenak, PA-C.  Risks of general anesthesia discussed including, but not limited to, sore throat, hoarse voice, chipped/damaged teeth, injury to vocal cords, nausea and vomiting, allergic reactions, lung infection, heart attack, stroke, and death. All questions answered.    )         Anesthesia Quick Evaluation

## 2024-04-15 ENCOUNTER — Ambulatory Visit (HOSPITAL_COMMUNITY): Payer: Self-pay | Admitting: Certified Registered"

## 2024-04-15 ENCOUNTER — Encounter (HOSPITAL_COMMUNITY)
Admission: AD | Disposition: A | Discharge status: Home / Self Care | Payer: Self-pay | Source: Home / Self Care | Attending: Interventional Radiology

## 2024-04-15 ENCOUNTER — Ambulatory Visit (HOSPITAL_COMMUNITY)
Admission: RE | Admit: 2024-04-15 | Discharge: 2024-04-15 | Disposition: A | Discharge status: Home / Self Care | Source: Ambulatory Visit | Attending: Interventional Radiology | Admitting: Interventional Radiology

## 2024-04-15 ENCOUNTER — Observation Stay (HOSPITAL_COMMUNITY)
Admission: AD | Admit: 2024-04-15 | Discharge: 2024-04-17 | Disposition: A | Discharge status: Home / Self Care | Attending: Interventional Radiology | Admitting: Interventional Radiology

## 2024-04-15 ENCOUNTER — Inpatient Hospital Stay (HOSPITAL_COMMUNITY): Payer: Self-pay | Admitting: Certified Registered"

## 2024-04-15 ENCOUNTER — Encounter (HOSPITAL_COMMUNITY): Payer: Self-pay | Admitting: Interventional Radiology

## 2024-04-15 DIAGNOSIS — Z79899 Other long term (current) drug therapy: Secondary | ICD-10-CM | POA: Diagnosis not present

## 2024-04-15 DIAGNOSIS — I10 Essential (primary) hypertension: Secondary | ICD-10-CM | POA: Insufficient documentation

## 2024-04-15 DIAGNOSIS — Z7982 Long term (current) use of aspirin: Secondary | ICD-10-CM | POA: Diagnosis not present

## 2024-04-15 DIAGNOSIS — I6503 Occlusion and stenosis of bilateral vertebral arteries: Secondary | ICD-10-CM | POA: Insufficient documentation

## 2024-04-15 DIAGNOSIS — K5090 Crohn's disease, unspecified, without complications: Secondary | ICD-10-CM | POA: Diagnosis not present

## 2024-04-15 DIAGNOSIS — I6529 Occlusion and stenosis of unspecified carotid artery: Secondary | ICD-10-CM | POA: Diagnosis present

## 2024-04-15 DIAGNOSIS — I771 Stricture of artery: Principal | ICD-10-CM

## 2024-04-15 DIAGNOSIS — Z7902 Long term (current) use of antithrombotics/antiplatelets: Secondary | ICD-10-CM | POA: Diagnosis not present

## 2024-04-15 DIAGNOSIS — R2689 Other abnormalities of gait and mobility: Secondary | ICD-10-CM | POA: Diagnosis not present

## 2024-04-15 DIAGNOSIS — F419 Anxiety disorder, unspecified: Secondary | ICD-10-CM | POA: Diagnosis not present

## 2024-04-15 DIAGNOSIS — I6521 Occlusion and stenosis of right carotid artery: Principal | ICD-10-CM | POA: Insufficient documentation

## 2024-04-15 DIAGNOSIS — R42 Dizziness and giddiness: Secondary | ICD-10-CM | POA: Diagnosis present

## 2024-04-15 HISTORY — PX: IR ANGIO INTRA EXTRACRAN SEL COM CAROTID INNOMINATE BILAT MOD SED: IMG5360

## 2024-04-15 HISTORY — PX: IR ANGIO VERTEBRAL SEL VERTEBRAL BILAT MOD SED: IMG5369

## 2024-04-15 HISTORY — PX: IR US GUIDE VASC ACCESS RIGHT: IMG2390

## 2024-04-15 HISTORY — PX: RADIOLOGY WITH ANESTHESIA: SHX6223

## 2024-04-15 LAB — BASIC METABOLIC PANEL WITH GFR
Anion gap: 10 (ref 5–15)
BUN: 9 mg/dL (ref 6–20)
CO2: 27 mmol/L (ref 22–32)
Calcium: 8.9 mg/dL (ref 8.9–10.3)
Chloride: 102 mmol/L (ref 98–111)
Creatinine, Ser: 0.72 mg/dL (ref 0.44–1.00)
GFR, Estimated: >60 mL/min (ref >60)
Glucose, Bld: 99 mg/dL (ref 70–99)
Potassium: 3.1 mmol/L — ABNORMAL LOW (ref 3.5–5.1)
Sodium: 139 mmol/L (ref 135–145)

## 2024-04-15 LAB — CBC WITH DIFFERENTIAL/PLATELET
Abs Immature Granulocytes: 0.03 K/uL (ref 0.00–0.07)
Basophils Absolute: 0 K/uL (ref 0.0–0.1)
Basophils Relative: 1 %
Eosinophils Absolute: 0.2 K/uL (ref 0.0–0.5)
Eosinophils Relative: 2 %
HCT: 38.8 % (ref 36.0–46.0)
Hemoglobin: 13 g/dL (ref 12.0–15.0)
Immature Granulocytes: 0 %
Lymphocytes Relative: 23 %
Lymphs Abs: 1.8 K/uL (ref 0.7–4.0)
MCH: 29.4 pg (ref 26.0–34.0)
MCHC: 33.5 g/dL (ref 30.0–36.0)
MCV: 87.8 fL (ref 80.0–100.0)
Monocytes Absolute: 0.4 K/uL (ref 0.1–1.0)
Monocytes Relative: 5 %
Neutro Abs: 5.1 K/uL (ref 1.7–7.7)
Neutrophils Relative %: 69 %
Platelets: 241 K/uL (ref 150–400)
RBC: 4.42 MIL/uL (ref 3.87–5.11)
RDW: 14.6 % (ref 11.5–15.5)
WBC: 7.5 K/uL (ref 4.0–10.5)
nRBC: 0 % (ref 0.0–0.2)

## 2024-04-15 LAB — PROTIME-INR
INR: 1 (ref 0.8–1.2)
Prothrombin Time: 13.3 s (ref 11.4–15.2)

## 2024-04-15 LAB — POCT PREGNANCY, URINE: Preg Test, Ur: NEGATIVE

## 2024-04-15 SURGERY — RADIOLOGY WITH ANESTHESIA
Anesthesia: General

## 2024-04-15 MED ORDER — NIMODIPINE 30 MG PO CAPS: 0.0000 mg | ORAL_CAPSULE | ORAL | Status: DC

## 2024-04-15 MED ORDER — DEXAMETHASONE SODIUM PHOSPHATE 10 MG/ML IJ SOLN
INTRAMUSCULAR | Status: DC | PRN
  Administered 2024-04-15: 10 mg via INTRAVENOUS

## 2024-04-15 MED ORDER — PHENYLEPHRINE 80 MCG/ML (10ML) SYRINGE FOR IV PUSH (FOR BLOOD PRESSURE SUPPORT)
PREFILLED_SYRINGE | INTRAVENOUS | Status: DC | PRN
  Administered 2024-04-15: 40 ug via INTRAVENOUS

## 2024-04-15 MED ORDER — SODIUM CHLORIDE 0.9 % IV SOLN: INTRAVENOUS | Status: DC

## 2024-04-15 MED ORDER — CLOPIDOGREL BISULFATE 75 MG PO TABS
75.0000 mg | ORAL_TABLET | Freq: Every day | ORAL | Status: DC
  Administered 2024-04-17: 75 mg
  Filled 2024-04-15: qty 1

## 2024-04-15 MED ORDER — CLEVIDIPINE BUTYRATE 0.5 MG/ML IV EMUL: 0.0000 mg/h | INTRAVENOUS | Status: DC

## 2024-04-15 MED ORDER — ASPIRIN 81 MG PO CHEW
81.0000 mg | CHEWABLE_TABLET | Freq: Every day | ORAL | Status: DC
  Administered 2024-04-15 – 2024-04-17 (×3): 81 mg via ORAL
  Filled 2024-04-15 (×3): qty 1

## 2024-04-15 MED ORDER — MIDAZOLAM HCL 2 MG/2ML IJ SOLN
INTRAMUSCULAR | Status: DC | PRN
  Administered 2024-04-15: 2 mg via INTRAVENOUS

## 2024-04-15 MED ORDER — FENTANYL CITRATE (PF) 250 MCG/5ML IJ SOLN
INTRAMUSCULAR | Status: DC | PRN
  Administered 2024-04-15: 100 ug via INTRAVENOUS

## 2024-04-15 MED ORDER — ROCURONIUM BROMIDE 10 MG/ML (PF) SYRINGE
PREFILLED_SYRINGE | INTRAVENOUS | Status: DC | PRN
  Administered 2024-04-15: 60 mg via INTRAVENOUS
  Administered 2024-04-15: 20 mg via INTRAVENOUS
  Administered 2024-04-15: 30 mg via INTRAVENOUS

## 2024-04-15 MED ORDER — FENTANYL CITRATE (PF) 100 MCG/2ML IJ SOLN: 25.0000 ug | INTRAMUSCULAR | Status: DC | PRN

## 2024-04-15 MED ORDER — IOHEXOL 300 MG/ML  SOLN
50.0000 mL | Freq: Once | INTRAMUSCULAR | Status: AC | PRN
  Administered 2024-04-15: 25 mL via INTRA_ARTERIAL

## 2024-04-15 MED ORDER — LABETALOL HCL 5 MG/ML IV SOLN
INTRAVENOUS | Status: DC | PRN
  Administered 2024-04-15 (×2): 5 mg via INTRAVENOUS

## 2024-04-15 MED ORDER — ORAL CARE MOUTH RINSE
15.0000 mL | Freq: Once | OROMUCOSAL | Status: AC
  Administered 2024-04-15: 15 mL via OROMUCOSAL

## 2024-04-15 MED ORDER — AMISULPRIDE (ANTIEMETIC) 5 MG/2ML IV SOLN: 10.0000 mg | Freq: Once | INTRAVENOUS | Status: DC | PRN

## 2024-04-15 MED ORDER — SUGAMMADEX SODIUM 200 MG/2ML IV SOLN
INTRAVENOUS | Status: DC | PRN
  Administered 2024-04-15: 400 mg via INTRAVENOUS

## 2024-04-15 MED ORDER — ACETAMINOPHEN 500 MG PO TABS
1000.0000 mg | ORAL_TABLET | Freq: Once | ORAL | Status: AC
  Administered 2024-04-15: 1000 mg via ORAL
  Filled 2024-04-15: qty 2

## 2024-04-15 MED ORDER — OXYCODONE HCL 5 MG/5ML PO SOLN: 5.0000 mg | Freq: Once | ORAL | Status: DC | PRN

## 2024-04-15 MED ORDER — SCOPOLAMINE 1 MG/3DAYS TD PT72
1.0000 | MEDICATED_PATCH | TRANSDERMAL | Status: DC
  Administered 2024-04-15: 1.5 mg via TRANSDERMAL
  Filled 2024-04-15: qty 1

## 2024-04-15 MED ORDER — LIDOCAINE HCL 1 % IJ SOLN
INTRAMUSCULAR | Status: AC
Start: 2024-04-15 — End: 2024-04-15
  Filled 2024-04-15: qty 20

## 2024-04-15 MED ORDER — ACETAMINOPHEN 650 MG RE SUPP: 650.0000 mg | RECTAL | Status: DC | PRN

## 2024-04-15 MED ORDER — PROPOFOL 10 MG/ML IV BOLUS
INTRAVENOUS | Status: DC | PRN
  Administered 2024-04-15: 200 mg via INTRAVENOUS

## 2024-04-15 MED ORDER — TRAMADOL HCL 50 MG PO TABS
50.0000 mg | ORAL_TABLET | Freq: Once | ORAL | Status: AC
  Administered 2024-04-15: 50 mg via ORAL
  Filled 2024-04-15: qty 1

## 2024-04-15 MED ORDER — CLOPIDOGREL BISULFATE 75 MG PO TABS
75.0000 mg | ORAL_TABLET | Freq: Every day | ORAL | Status: DC
  Administered 2024-04-15 – 2024-04-16 (×2): 75 mg via ORAL
  Filled 2024-04-15 (×3): qty 1

## 2024-04-15 MED ORDER — OXYCODONE HCL 5 MG PO TABS: 5.0000 mg | ORAL_TABLET | Freq: Once | ORAL | Status: DC | PRN

## 2024-04-15 MED ORDER — ASPIRIN 81 MG PO CHEW
81.0000 mg | CHEWABLE_TABLET | Freq: Every day | ORAL | Status: DC
  Filled 2024-04-15: qty 1

## 2024-04-15 MED ORDER — MIDAZOLAM HCL 2 MG/2ML IJ SOLN
INTRAMUSCULAR | Status: AC
  Filled 2024-04-15: qty 2

## 2024-04-15 MED ORDER — HEPARIN SODIUM (PORCINE) 1000 UNIT/ML IJ SOLN
INTRAMUSCULAR | Status: DC | PRN
  Administered 2024-04-15: 3000 [IU] via INTRAVENOUS

## 2024-04-15 MED ORDER — ACETAMINOPHEN 160 MG/5ML PO SOLN: 650.0000 mg | ORAL | Status: DC | PRN

## 2024-04-15 MED ORDER — PHENYLEPHRINE HCL-NACL 20-0.9 MG/250ML-% IV SOLN
INTRAVENOUS | Status: DC | PRN
  Administered 2024-04-15: 30 ug/min via INTRAVENOUS

## 2024-04-15 MED ORDER — CHLORHEXIDINE GLUCONATE 0.12 % MT SOLN: 15.0000 mL | Freq: Once | OROMUCOSAL | Status: DC

## 2024-04-15 MED ORDER — ONDANSETRON HCL 4 MG/2ML IJ SOLN
INTRAMUSCULAR | Status: DC | PRN
  Administered 2024-04-15: 4 mg via INTRAVENOUS

## 2024-04-15 MED ORDER — IOHEXOL 300 MG/ML  SOLN
150.0000 mL | Freq: Once | INTRAMUSCULAR | Status: AC | PRN
  Administered 2024-04-15: 50 mL via INTRA_ARTERIAL

## 2024-04-15 MED ORDER — ALPRAZOLAM 0.5 MG PO TABS
0.5000 mg | ORAL_TABLET | Freq: Every day | ORAL | Status: DC
  Administered 2024-04-15 – 2024-04-16 (×2): 0.5 mg via ORAL
  Filled 2024-04-15 (×2): qty 1

## 2024-04-15 MED ORDER — LIDOCAINE 2% (20 MG/ML) 5 ML SYRINGE
INTRAMUSCULAR | Status: DC | PRN
  Administered 2024-04-15: 60 mg via INTRAVENOUS

## 2024-04-15 MED ORDER — CEFAZOLIN SODIUM-DEXTROSE 2-4 GM/100ML-% IV SOLN
2.0000 g | INTRAVENOUS | Status: AC
  Administered 2024-04-15: 2 g via INTRAVENOUS
  Filled 2024-04-15: qty 100

## 2024-04-15 MED ORDER — ACETAMINOPHEN 325 MG PO TABS
650.0000 mg | ORAL_TABLET | ORAL | Status: DC | PRN
  Administered 2024-04-15 (×2): 650 mg via ORAL
  Filled 2024-04-15 (×2): qty 2

## 2024-04-15 MED ORDER — FENTANYL CITRATE (PF) 100 MCG/2ML IJ SOLN
INTRAMUSCULAR | Status: AC
  Filled 2024-04-15: qty 2

## 2024-04-15 NOTE — Anesthesia Procedure Notes (Addendum)
 Procedure Name: Intubation Date/Time: 04/15/2024 8:43 AM  Performed by: Mathew Bernardino RAMAN, RNPre-anesthesia Checklist: Patient identified, Emergency Drugs available, Suction available and Patient being monitored Patient Re-evaluated:Patient Re-evaluated prior to induction Oxygen Delivery Method: Circle System Utilized Preoxygenation: Pre-oxygenation with 100% oxygen Induction Type: IV induction Ventilation: Mask ventilation without difficulty Laryngoscope Size: Mac and 4 Grade View: Grade II Tube type: Oral Tube size: 7.0 mm Number of attempts: 1 Airway Equipment and Method: Stylet Placement Confirmation: ETT inserted through vocal cords under direct vision, positive ETCO2 and breath sounds checked- equal and bilateral Secured at: 22 cm Tube secured with: Tape Dental Injury: Teeth and Oropharynx as per pre-operative assessment

## 2024-04-15 NOTE — Procedures (Signed)
 INR.  Status post four-vessel cerebral arteriogram.  Right CFA approach.  Findings  1.  Fully patent previously positioned stent in the RT ICA petrous segment.  2.  Partial retrograde reconstitution of the right MCA via the posterior communicating artery from the vertebral arteries.  3.  Approximately 50 to 60% stenosis the right vertebral artery in the V2 horizontal  segment associated with mild fusiform dilatation.  Finding suspicious of fibromuscular dysplasia.  4.  Diffuse fusiform dilatation/ectasia of the dominant left vertebral artery in its entirety .   8 French Angio-Seal closure device deployed at the right groin puncture site.  Distal pulses intact.    Patient extubated.  Denies headaches or nausea or vomiting.     Follows commands appropriately. Moves all 4s equally.    GORMAN Nash MD.

## 2024-04-15 NOTE — Anesthesia Procedure Notes (Signed)
 Arterial Line Insertion Start/End7/23/2025 8:40 AM, 04/15/2024 8:43 AM Performed by: Peggye Delon Brunswick, MD, Zyona Pettaway, Lonni PARAS, CRNA, CRNA  Patient location: OR. Preanesthetic checklist: patient identified, IV checked, site marked, risks and benefits discussed, surgical consent, monitors and equipment checked, pre-op evaluation, timeout performed and anesthesia consent Patient sedated Left, radial was placed Catheter size: 20 G Hand hygiene performed  and Seldinger technique used Allen's test indicative of satisfactory collateral circulation Attempts: 1 Procedure performed without using ultrasound guided technique. Post-procedure: No Biopatch -- CHG Allergy. Post procedure assessment: normal  Patient tolerated the procedure well with no immediate complications. Additional procedure comments: No biopatch used due to chlorhexidine  allergy.

## 2024-04-15 NOTE — Sedation Documentation (Signed)
 Patient moved to table by staff , secured, hooked to monitors, and now under the care of anesthesia. Please see charting in vitals per CRNA.

## 2024-04-15 NOTE — Anesthesia Postprocedure Evaluation (Signed)
 Anesthesia Post Note  Patient: Diane [name]  Procedure(s) Performed: RADIOLOGY WITH ANESTHESIA IR US  GUIDE VASC ACCESS RIGHT IR ANGIO INTRA EXTRACRAN SEL COM CAROTID INNOMINATE BILAT MOD SED IR ANGIO VERTEBRAL SEL VERTEBRAL BILAT MOD SED     Patient location during evaluation: PACU Anesthesia Type: General Level of consciousness: awake Pain management: pain level controlled Vital Signs Assessment: post-procedure vital signs reviewed and stable Respiratory status: spontaneous breathing, nonlabored ventilation and respiratory function stable Cardiovascular status: blood pressure returned to baseline and stable Postop Assessment: no apparent nausea or vomiting Anesthetic complications: no   No notable events documented.  Last Vitals:  Vitals:   04/15/24 1130 04/15/24 1200  BP: 126/81 124/84  Pulse: 68 79  Resp: 16 13  Temp: 36.9 C   SpO2: 98% 99%    Last Pain:  Vitals:   04/15/24 1303  TempSrc:   PainSc: 9                  Delon Aisha Arch

## 2024-04-15 NOTE — Progress Notes (Signed)
 Orthopedic Tech Progress Note Patient Details:  Diane [name] October 21, 1980 [id number]  Ortho Devices Type of Ortho Device: Knee Immobilizer Ortho Device/Splint Location: RLE Ortho Device/Splint Interventions: Application   Post Interventions Patient Tolerated: Well  Massie BRAVO Diane [name] 04/15/2024, 5:17 PM

## 2024-04-15 NOTE — Sedation Documentation (Signed)
 Patient transported to recovery area via stretcher with CRNA.

## 2024-04-15 NOTE — Transfer of Care (Signed)
 Immediate Anesthesia Transfer of Care Note  Patient: Diane [name]  Procedure(s) Performed: RADIOLOGY WITH ANESTHESIA IR US  GUIDE VASC ACCESS RIGHT IR ANGIO INTRA EXTRACRAN SEL COM CAROTID INNOMINATE BILAT MOD SED IR ANGIO VERTEBRAL SEL VERTEBRAL BILAT MOD SED  Patient Location: PACU  Anesthesia Type:General  Level of Consciousness: drowsy and patient cooperative  Airway & Oxygen Therapy: Patient Spontanous Breathing  Post-op Assessment: Report given to RN and Post -op Vital signs reviewed and stable  Post vital signs: Reviewed and stable  Last Vitals:  Vitals Value Taken Time  BP 158/95 04/15/24 10:15  Temp    Pulse 85 04/15/24 10:18  Resp 14 04/15/24 10:18  SpO2 100 % 04/15/24 10:18  Vitals shown include unfiled device data.  Last Pain:  Vitals:   04/15/24 0726  TempSrc:   PainSc: 0-No pain         Complications: No notable events documented.

## 2024-04-15 NOTE — Progress Notes (Signed)
 Interventional Radiology Brief Note:  Patient assessed this afternoon alongside Dr. Dolphus.  She is lying flat. Complains of a light headache for which she took Tylenol  without relief.  Groin site assessed and found intact.  Dressing clean and dry.  Distal pulses palpable.   Patient to remain overnight for groin/activity precautions.   Will order one time dose of Tramadol  for headache.  Dr. Dolphus has encouraged fluids.   Liliahna Cudd, MS RD PA-C 4:12 PM

## 2024-04-15 NOTE — Sedation Documentation (Signed)
 Bedside report given to RN. Femoral site assessed - Level 0, no hematoma, dressing is clean, dry, and intact. Pulses also assessed bilaterally.

## 2024-04-16 ENCOUNTER — Encounter (HOSPITAL_COMMUNITY): Payer: Self-pay | Admitting: Interventional Radiology

## 2024-04-16 DIAGNOSIS — I6521 Occlusion and stenosis of right carotid artery: Secondary | ICD-10-CM | POA: Diagnosis not present

## 2024-04-16 LAB — CBC
HCT: 34.5 % — ABNORMAL LOW (ref 36.0–46.0)
Hemoglobin: 11.7 g/dL — ABNORMAL LOW (ref 12.0–15.0)
MCH: 29.6 pg (ref 26.0–34.0)
MCHC: 33.9 g/dL (ref 30.0–36.0)
MCV: 87.3 fL (ref 80.0–100.0)
Platelets: 213 K/uL (ref 150–400)
RBC: 3.95 MIL/uL (ref 3.87–5.11)
RDW: 14.8 % (ref 11.5–15.5)
WBC: 6.6 K/uL (ref 4.0–10.5)
nRBC: 0 % (ref 0.0–0.2)

## 2024-04-16 LAB — BASIC METABOLIC PANEL WITH GFR
Anion gap: 3 — ABNORMAL LOW (ref 5–15)
BUN: 9 mg/dL (ref 6–20)
CO2: 28 mmol/L (ref 22–32)
Calcium: 7.7 mg/dL — ABNORMAL LOW (ref 8.9–10.3)
Chloride: 111 mmol/L (ref 98–111)
Creatinine, Ser: 0.81 mg/dL (ref 0.44–1.00)
GFR, Estimated: >60 mL/min (ref >60)
Glucose, Bld: 93 mg/dL (ref 70–99)
Potassium: 2.9 mmol/L — ABNORMAL LOW (ref 3.5–5.1)
Sodium: 142 mmol/L (ref 135–145)

## 2024-04-16 MED ORDER — AMLODIPINE BESYLATE 5 MG PO TABS
5.0000 mg | ORAL_TABLET | Freq: Every day | ORAL | Status: AC
  Administered 2024-04-16: 5 mg via ORAL
  Filled 2024-04-16: qty 1

## 2024-04-16 MED ORDER — POTASSIUM CHLORIDE CRYS ER 20 MEQ PO TBCR
20.0000 meq | EXTENDED_RELEASE_TABLET | ORAL | Status: AC
  Administered 2024-04-16 (×2): 20 meq via ORAL
  Filled 2024-04-16 (×2): qty 1

## 2024-04-16 MED ORDER — SODIUM CHLORIDE 0.9 % IV BOLUS
250.0000 mL | Freq: Once | INTRAVENOUS | Status: AC
  Administered 2024-04-16: 250 mL via INTRAVENOUS

## 2024-04-16 MED ORDER — ATORVASTATIN CALCIUM 10 MG PO TABS
20.0000 mg | ORAL_TABLET | Freq: Every day | ORAL | Status: DC
  Administered 2024-04-16 – 2024-04-17 (×2): 20 mg via ORAL
  Filled 2024-04-16 (×2): qty 2

## 2024-04-16 NOTE — Discharge Summary (Signed)
 [name] ID: Diane [name] MRN: [id number] DOB/AGE: 01-Nov-1980 43 y.o.  Admit date: 04/15/2024 Discharge date: 04/16/2024  Supervising Physician: Dolphus Carrion  [name] Status: [facility] - In-pt  Admission Diagnoses: Right sided headaches, dizziness, carotid stenosis.  Discharge Diagnoses:  Principal Problem:   Carotid stenosis   Discharged Condition: good  Hospital Course: Diane [name] has as history of symptomatic high grade stenosis of Diane proximal cavernous right ICA with stent assisted angioplasty 10/15/22 by Dr. Dolphus. She reported new right sided headaches and dizziness at recent follow up and decision was made to proceed with diagnostic cerebral angiogram with possible intervention.   [name] presented as an outpatient for planned procedure on 04/15/24. Diagnostic cerebral angiogram was performed via right common femoral artery without complication. Angiogram notable for fully patent right ICA stent and approximately 50-60% stenosis of Diane right vertebral artery in Diane V2 horizontal segment associated with mild fusiform dilatation, suspicious for fibromuscular dysplasia. No intervention was required and [name] was admitted overnight for observation.  Overnight course uneventful per [name] and RN. Diane [name] reports ongoing headache which is unchanged from presentation, she denies any n/v, vision changes. She had one episode of light headedness with Tramadol  administration last night, however this has resolved. No issues with eating/drinking. Discussed post discharge care instructions and follow up with [name] who states understanding and is ready to go home.   Plan: - Discontinue foley, [name] must urinate post removal prior to discharge - Ambulate prior to discharge. - Continue ASA 81 mg every day + Plavix  75 mg every day - Maintain BP control. - Follow femoral site care discharge instructions; no bending/stooping/lifting for 2 weeks, limit use of stairs when possible, hold  pressure over right CFA when ambulating up stairs.  - May drive self 72 hours post procedure. - May return to work with restrictions on 04/19/24 (work note in discharge summary). - Keep follow up with Dr. Dolphus (scheduled for 04/22/24). - CTA head/neck in 6 months. Follow up with PCP/neurologist after resulted. - Maintain follow up with all other providers, please call [phone] with questions or concerns for Dr. Dolphus.  Consults: None  Significant Diagnostic Studies: IR Radiologist Eval & Mgmt Result Date: 04/13/2024 EXAM: ESTABLISHED [name] OFFICE VISIT CHIEF COMPLAINT: 43 year old right handed lady who presents in follow-up to discuss Diane findings of her most recent CT angiogram of Diane head and neck. Current Pain Level: 1-10 HISTORY OF PRESENT ILLNESS: Diane [name] underwent uneventful endovascular revascularization of symptomatic high-grade stenosis of Diane proximal cavernous right ICA and stent assisted angioplasty of Diane distal petrous segment on 10/16/2023. There were no periprocedural complications. Diane [name] had a follow-up CT angiogram of Diane head and neck on 11/04/2023 which revealed a 3 mm filling defect questionable thrombus in Diane right supraclinoid ICA with questionable stenosis of Diane cavernous ICA just distal to Diane stent. Her most recent CT angiogram of 04/02/2024 suggests occlusion or near occlusion of Diane cavernous segment of Diane right internal carotid artery as per Diane radiology report. Clinically, Diane [name] reports episodic sudden transient sharp pain in Diane right retro-orbital and supraorbital regions. During this time Diane [name] reports no changes in her vision or double vision. She reports this at a frequency of 1-2 per day. Additionally, Diane [name] reports a right-sided orbital frontal and frontal headache which she has 5-6 per week. She reports no neurological features of blindness, double vision or amaurosis fugax, or of left-sided numbness, tingling, weakness or  incoordination. She reports no recent symptoms of loss of  consciousness, or of seizure-like activity. These headaches are relieved significantly with Tylenol . She remains functional in terms of her daily routine activities including work. Denies any chest pain, shortness of breath, palpitations or pedal edema. Breathing is clear without wheezing or coughing. Appetite is normal. Weight is steady. No abdominal pain, constipation, diarrhea or melena. No abnormal urinary symptoms. No recent chills, fever or rigors. Diagnosis * : Date . * : ADD (attention deficit disorder) * : . * : Anemia * : . * : Anxiety * : . * : Crohn's disease (HCC) * : . * : D-dimer, elevated * : . * : Fibroid * : . * : Headache(784.0) * : * : otc meds - last migraine 2009 . * : Heart murmur * : * : as child, never had any problems . * : History of endometriosis * : . * : Hypertension * : . * : Leiomyoma * : 02/09/2015 . * : Low blood potassium * : * : history . * : Migraine without aura, with intractable migraine, so stated, without mention of status migrainosus * : 06/19/2013 . * : Pneumonia * : * : x5 . * : PONV (postoperative nausea and vomiting) * : . * : Seasonal allergies * : . * : Vaginal Pap smear, abnormal * : . * : Vasovagal syncope * : : Past Surgical History: Procedure * : Laterality * : Date . * : ADENOIDECTOMY * : * : 2014 . * : CESAREAN SECTION * : N/A * : 04/14/2018 * : Procedure: PRIMARY CESAREAN SECTION; Surgeon: Edsel Norleen GAILS, MD; Location: [facility]IR[facility]ING SU[facility]; Service: Obstetrics; Laterality: N/A; . * : DILATATION & CURRETTAGE/HYSTEROSCOPY WITH RESECTOCOPE * : N/A * : 06/09/2013 * : Procedure: DILATATION & CURETTAGE/HYSTEROSCOPY WITH HYSTEROSCOPIC RESECTION OF SUBMUCOSAL FIBROID AND ENDOMETRIAL POLYP; Surgeon: Dickie DELENA Carder, MD; Location: [facility]; Service: Gynecology; Laterality: N/A; . * : IR ANGIO INTRA EXTRACRAN SEL COM CAROTID INNOMINATE BILAT MOD SED * : * : 09/30/2023 . * : IR ANGIO VERTEBRAL SEL SUBCLAVIAN  INNOMINATE UNI L MOD SED * : * : 09/30/2023 . * : IR ANGIO VERTEBRAL SEL VERTEBRAL UNI R MOD SED * : * : 09/30/2023 . * : IR RADIOLOGIST EVAL & MGMT * : * : 09/30/2023 . * : IR RADIOLOGIST EVAL & MGMT * : * : 10/03/2023 . * : IR US  GUIDE VASC ACCESS RIGHT * : * : 09/30/2023 . * : LAPAROSCOPY * : N/A * : 06/09/2013 * : Procedure: LAPAROSCOPY DIAGNOSTIC; Surgeon: Dickie DELENA Carder, MD; Location: [facility]; Service: Gynecology; Laterality: N/A; . * : ROBOT ASSISTED MYOMECTOMY * : N/A * : 02/09/2015 * : Procedure: MYOMECTOMY,EXCISION OF ENDOMETRIOSIS,PRE-SACRAL NEURECTOMY; Surgeon: Cynthia Loss, MD; Location: [facility]; Service: Gynecology; Laterality: N/A; . * : ROBOTIC ASSISTED LAPAROSCOPIC LYSIS OF ADHESION * : N/A * : 06/09/2013 * : Procedure: ROBOTIC ASSISTED LAPAROSCOPIC LYSIS OF ADHESION; Resection of Endometriosis and excision of fibroid; Surgeon: Dickie DELENA Carder, MD; Location: [facility]; Service: Gynecology; Laterality: N/A; . * : TONSILLECTOMY * : * : 2014 . * : TYMPANOSTOMY TUBE PLACEMENT * : * : 2014 Allergies: [name] has no known allergies. Medications: Prior to Admission medications Medication * : Sig * : Start Date * : End Date * : Taking? * : Authorizing Provider acetaminophen  (TYLENOL ) 325 MG tablet * : Take 2 tablets (650 mg total) by mouth every 4 (four) hours as needed (for pain scale < 4).[name] not taking: Reported on 10/15/2023 * :  04/17/18 * : * : * BETHA Izell Harari, MD ALPRAZolam  (XANAX ) 0.5 MG tablet * : Take 0.5 mg by mouth at bedtime. * : 10/10/23 * : * : * : [provider] amLODipine  (NORVASC ) 5 MG tablet * : Take 1 tablet (5 mg total) by mouth daily. * : 10/14/23 * : * : * : Paseda, Folashade R, FNP amphetamine-dextroamphetamine (ADDERALL) 30 MG tablet * : Take 30 mg by mouth daily.[name] not taking: Reported on 10/14/2023 * : * : * : * : [provider] aspirin  81 MG chewable tablet * : Chew 1 tablet (81 mg total) by mouth daily. * : 09/13/23 * : * : * BETHA Carita Senior, MD atorvastatin  (LIPITOR) 20 MG tablet * : Take 1 tablet (20 mg total) by mouth daily. * : 10/15/23 * : 10/14/24 * : * : Paseda, Folashade R, FNP cetirizine  (ZYRTEC  ALLERGY) 10 MG tablet * : Take 1 tablet (10 mg total) by mouth daily. * : 10/14/23 * : 04/11/24 * : * : Paseda, Folashade R, FNP clopidogrel  (PLAVIX ) 75 MG tablet * : Take 1 tablet (75 mg total) by mouth daily. * : 10/03/23 * : * : * : Bruning, Kevin, PA-C ELDERBERRY PO * : Take 2 tablets by mouth daily at 2 am. Gummy * : * : * : * : [provider] gabapentin  (NEURONTIN ) 100 MG capsule * : Take 1 capsule (100 mg total) by mouth 3 (three) times daily.[name] not taking: Reported on 10/14/2023 * : 09/28/23 * : * : * : Lang Norleen POUR, PA-C lidocaine  (LIDODERM ) 5 % * : Place 1 patch onto Diane skin daily. Remove & Discard patch within 12 hours or as directed by MDPatient not taking: Reported on 10/14/2023 * : 09/28/23 * : * : * : Jerral Meth, MD ondansetron  (ZOFRAN -ODT) 4 MG disintegrating tablet * : Take 1 tablet (4 mg total) by mouth every 8 (eight) hours as needed for nausea or vomiting.[name] not taking: Reported on 10/14/2023 * : 09/15/23 * : * : * : Kommor, Madison, MD predniSONE  (DELTASONE ) 10 MG tablet * : Take 5 tablets (50 mg total) by mouth daily for 3 days, THEN 4 tablets (40 mg total) daily for 3 days, THEN 3 tablets (30 mg total) daily for 3 days, THEN 2 tablets (20 mg total) daily for 3 days, THEN 1 tablet (10 mg total) daily for 3 days, THEN 0.5 tablets (5 mg total) daily for 3 days.[name] not taking: Reported on 10/15/2023 * : 09/28/23 * : 10/16/23 * : * : Jerral Meth, MD Saline 0.65 % SOLN * : Place 1 spray into Diane nose daily. * : * : * : * : [provider] Family History Problem * : Relation * : Age of Onset . * : Heart disease * : Mother * : 33 * :     CHF . * : Crohn's disease * : Mother * : . * : Asthma * : Mother * : . * : Asthma * : Brother * : . * : Heart disease * : Maternal Aunt * : . * : Diabetes * :  Maternal Aunt * : . * : Stroke * : Maternal Aunt * : . * : Heart disease * : Maternal Grandfather * : . * : Stroke * : Maternal Grandfather * : Socioeconomic History . * : Marital status: * : Divorced * : * : Spouse name: * : Not on  file . * : Number of children: * : 2 . * : Years of education: * : some coll. . * : Highest education level: * : Not on file Occupational History . * : Not on file Tobacco Use . * : Smoking status: * : Never . * : Smokeless tobacco: * : Never Vaping Use . * : Vaping status: * : Never Used Substance and Sexual Activity . * : Alcohol use: * : Not Currently . * : Drug use: * : No . * : Sexual activity: * : Yes * : * : Birth control/protection: * : None Other Topics * : Concern . * : Not on file Social History Narrative * : [name] does not drink caffeine. * : [name] is right handed. Food Insecurity: Not on file Transportation Needs: Not on file Physical Activity: Not on file Stress: Not on file Social Connections: Not on file Review of Systems: A 12 point ROS discussed and pertinent positives are indicated in Diane HPI above. All other systems are negative. Review of Systems Constitutional: Negative for activity change, fatigue and fever. HENT: Negative for sneezing, sore throat and tinnitus. Eyes: Negative for visual disturbance. Respiratory: Negative for cough and shortness of breath. Cardiovascular: Negative for chest pain. Gastrointestinal: Negative for abdominal pain, diarrhea, nausea and vomiting. Musculoskeletal: Negative for back pain and gait problem. Neurological: Negative for dizziness, tremors, seizures, syncope, facial asymmetry, speech difficulty, weakness, light-headedness, numbness and headaches. Psychiatric/Behavioral: Negative for behavioral problems and confusion. Social History: No change. Family History:  No change. REVIEW OF SYSTEMS: Negative unless as mentioned above. PHYSICAL EXAMINATION: Alert, awake, oriented to time, place, space. Speech and comprehension intact.  No abnormal lateralizing neurological features. Station and gait normal. ASSESSMENT AND PLAN: Most recent CT angiogram of Diane head and neck were reviewed with Diane [name]. Brought to her attention was Diane faint contrast enhancement just distal to Diane previously positioned stent in Diane distal petrous segment. Question was Diane intra stent stenosis persists worsening atherosclerotic disease distal to this. Following further discussions, in order to ascertain adequately Diane status of Diane right ICA in Diane treated region, a formal diagnostic catheter arteriogram would be appropriate. Diane [name] is familiar with this procedure. Should a high-grade stenosis be discovered, endovascular treatment with balloon angioplasty would be Diane viable option. Diane [name] is aware of Diane risks of Diane procedure of 1-2% chance of new ischemic stroke, renal dysfunction related to contrast media, infection, bleeding, hematoma at Diane access site but not limited to these. Diane [name] would like to proceed with a diagnostic catheter arteriogram with intent to treat under anesthesia. Hopefully this can be scheduled as soon as possible. Should Diane [name] develop stroke-like symptoms, [name] advised to call 911. [name] also advised to continue all her medications, and maintain adequate hydration. She leaves with good understanding and agreement with Diane above management plan. Electronically Signed   By: Thyra Nash M.D.   On: 04/13/2024 08:58   CT ANGIO HEAD NECK W WO CM Result Date: 04/02/2024 CLINICAL DATA:  Follow-up carotid thrombus EXAM: CT ANGIOGRAPHY HEAD AND NECK WITH AND WITHOUT CONTRAST TECHNIQUE: Multidetector CT imaging of Diane head and neck was performed using Diane standard protocol during bolus administration of intravenous contrast. Multiplanar CT image reconstructions and MIPs were obtained to evaluate Diane vascular anatomy. Carotid stenosis measurements (when applicable) are obtained utilizing NASCET criteria, using Diane  distal internal carotid diameter as Diane denominator. RADIATION DOSE REDUCTION: This exam was performed according to Diane departmental dose-optimization  program which includes automated exposure control, adjustment of the mA and/or kV according to [name] size and/or use of iterative reconstruction technique. CONTRAST:  75mL OMNIPAQUE  IOHEXOL  350 MG/ML SOLN COMPARISON:  November 04, 2023 FINDINGS: CT Head: There is no hemorrhage. No acute ischemic changes. No mass lesion. Diane ventricles are normal. Skull/sinuses/orbits: No significant abnormality. CTA NECK: CTA NECK Aortic arch: No proximal vessel stenosis. Right carotid: Diane carotid bifurcation is normal. Diane cervical internal carotid artery is relatively small. Left carotid: Normal Right vertebral: Normal Left vertebral: Normal Soft tissues: No significant abnormality Other comments: None CTA HEAD: CTA HEAD Right anterior circulation: There is a nearly occlusive stenosis of Diane cavernous segment of Diane right internal carotid artery. Diane thrombus noted in Diane carotid terminus on Diane prior study has resolved. Diane a 1 segment is small. Diane anterior cerebral artery and middle cerebral artery are patent without proximal branch occlusion Left anterior circulation: Diane internal carotid artery is patent without significant stenosis. Diane anterior and middle cerebral arteries are patent without significant stenosis or proximal branch occlusion. No aneurysm. Both anterior cerebral arteries arise predominantly from Diane left side. Posterior circulation: Both vertebral arteries are patent. There is no significant basilar stenosis. Both posterior cerebral arteries are patent without significant stenosis or proximal branch occlusion. No aneurysm. IMPRESSION: 1. No extracranial carotid artery stenosis 2. Occlusion or near occlusion within Diane cavernous segment of Diane right internal carotid artery 3. Diane thrombus noted in Diane right carotid terminus on Diane prior study has resolved  Electronically Signed   By: Nancyann Burns M.D.   On: 04/02/2024 12:31    Treatments: Cerebral angiogram without intervention, IVF  Discharge Exam: Blood pressure 130/75, pulse (!) 59, temperature 98.5 F (36.9 C), temperature source Oral, resp. rate 19, height 5' 7.75 (1.721 m), weight 211 lb (95.7 kg), last menstrual period 04/12/2024, SpO2 97%. Physical Exam Vitals and nursing note reviewed.  Constitutional:      General: She is not in acute distress. HENT:     Head: Normocephalic.  Cardiovascular:     Rate and Rhythm: Normal rate.     Comments: (+) Right CFA puncture site clean, dry, soft, mildly tender to palpation. Mild bruising. No erythema, edema or significant bruising.  Pulmonary:     Effort: Pulmonary effort is normal.  Abdominal:     Palpations: Abdomen is soft.  Skin:    General: Skin is warm and dry.  Neurological:     Mental Status: She is alert.   Alert, awake, and oriented x 4 Speech and comprehension in tact PERRL bilaterally EOMs without nystagmus or subjective diplopia. Visual fields grossly intact No facial asymmetry. Tongue midline Motor power 5/5 all 4 extremities Fine motor and coordination in tact Distal pulses intact  Disposition:    Allergies as of 04/16/2024       Reactions   Chlorhexidine  Itching   [name] also had mild redness and hives in one location.  Recommended to pre medicate with Benadryl  if needed.        Medication List     TAKE these medications    acetaminophen  500 MG tablet Commonly known as: TYLENOL  Take 1,000 mg by mouth every 6 (six) hours as needed for mild pain (pain score 1-3) or headache.   ALPRAZolam  0.5 MG tablet Commonly known as: XANAX  Take 0.5 mg by mouth at bedtime as needed for anxiety.   ALPRAZolam  0.5 MG tablet Commonly known as: XANAX  Take 0.5 mg by mouth at bedtime as needed for anxiety.  amLODipine  5 MG tablet Commonly known as: NORVASC  TAKE 1 TABLET (5 MG TOTAL) BY MOUTH DAILY.   aspirin   81 MG chewable tablet Chew 1 tablet (81 mg total) by mouth daily.   atorvastatin  20 MG tablet Commonly known as: LIPITOR TAKE 1 TABLET BY MOUTH EVERY DAY What changed: when to take this   cetirizine  10 MG tablet Commonly known as: ZYRTEC  TAKE 1 TABLET BY MOUTH EVERY DAY   clopidogrel  75 MG tablet Commonly known as: Plavix  Take 1 tablet (75 mg total) by mouth daily.   ELDERBERRY PO Take 2 tablets by mouth daily.   OVER Diane COUNTER MEDICATION Take 1 tablet by mouth. Calcium  supplement daily--unknown dose   polyethylene glycol 17 g packet Commonly known as: MIRALAX  / GLYCOLAX  Take 17 g by mouth daily.   Qnasl  80 MCG/ACT Aers Generic drug: Beclomethasone Dipropionate  Place 2 sprays into Diane nose daily. What changed:  when to take this reasons to take this          Electronically Signed: Clotilda DELENA Hesselbach, PA-C 04/16/2024, 8:48 AM   I have spent Greater Than 30 Minutes discharging Diane [name].

## 2024-04-16 NOTE — Progress Notes (Signed)
 Patient with persistent light headedness which worsens with standing as well as headache. She reports feeling like she might pass out with standing and is nervous to ambulate. She states this is similar to her experience last time she had anesthesia and is wondering if that could be the cause. She denies n/v, vision changes - has been tolerating PO intake. Foley was removed and she feels like she could urinate but is nervous to ambulate to the bathroom.  CBC ordered earlier today for light headedness with no concerns. No bleeding from arterial puncture site. Vital signs have remained stable both while laying and standing.  Plan: - Overnight observation/further workup. - BMP, history of low K+ which required PO supplementation. If K+ low plan to give 20 mEq K+ PO for replacement. - Remove scopolamine  patch given common side effect is dizziness/lightheadedness and patient has been wearing patch since 7/23 0730. - PT evaluation 7/25 AM. - Encourage PO fluids. - Ambulate with assistance to bathroom if possible. - Ordered Atorvastatin  20 mg per home medication list.  Clotilda Hesselbach, PA-C

## 2024-04-16 NOTE — TOC CM/SW Note (Signed)
.  Transition of Care [facility]) - Inpatient Brief Assessment   Patient Details  Name: Diane [name] MRN: [id number] Date of Birth: [birth year]  Transition of Care [facility] CM/SW Contact:    Justina Delcia Czar, RN Phone Number: [phone] 04/16/2024, 9:57 AM   Clinical Narrative:   Reviewed chart and no discharge need identified.    Transition of Care Asessment: Insurance and Status: Insurance coverage has been reviewed Patient has primary care physician: Yes     Prior/Current Home Services: No current home services Social Drivers of Health Review: SDOH reviewed no interventions necessary Readmission risk has been reviewed: No Transition of care needs: no transition of care needs at this time

## 2024-04-16 NOTE — Discharge Instructions (Addendum)
 Femoral Site Care This sheet gives you information about how to care for yourself after your procedure. Your health care provider may also give you more specific instructions. If you have problems or questions, contact your health care provider. What can I expect after the procedure? After the procedure, it is common to have: Bruising that usually fades within 1-2 weeks. Tenderness at the site. Follow these instructions at home: Wound care Follow instructions from your health care provider about how to take care of your insertion site. Make sure you: Wash your hands with soap and water before you change your bandage (dressing). If soap and water are not available, use hand sanitizer. Change your dressing as directed- pressure dressing removed 24 hours post-procedure (and switch for bandaid), bandaid removed 72 hours post-procedure Do not take baths, swim, or use a hot tub for 7 days post-procedure. You may shower 48 hours after the procedure or as told by your health care provider. Gently wash the site with plain soap and water. Pat the area dry with a clean towel. Do not rub the site. This may cause bleeding. Check your site every day for signs of infection. Check for: Redness, swelling, or pain. Fluid or blood. Warmth. Pus or a bad smell. Activity Do not stoop, bend, or lift anything that is heavier than 10 lb (4.5 kg) for 2 weeks post-procedure. Do not drive self for 72 hours. Contact a health care provider if you have: A fever or chills. You have redness, swelling, or pain around your insertion site. Get help right away if: The catheter insertion area swells very fast. You pass out. You suddenly start to sweat or your skin gets clammy. The catheter insertion area is bleeding, and the bleeding does not stop when you hold steady pressure on the area. The area near or just beyond the catheter insertion site becomes pale, cool, tingly, or numb. These symptoms may represent a serious  problem that is an emergency. Do not wait to see if the symptoms will go away. Get medical help right away. Call your local emergency services (911 in the [place] Do not drive yourself to the hospital.  This information is not intended to replace advice given to you by your health care provider. Make sure you discuss any questions you have with your health care provider. Document Revised: 09/23/2017 Document Reviewed: 09/23/2017 Elsevier Patient Education  2020 [employer].

## 2024-04-17 DIAGNOSIS — I6521 Occlusion and stenosis of right carotid artery: Secondary | ICD-10-CM | POA: Diagnosis not present

## 2024-04-17 LAB — BASIC METABOLIC PANEL WITH GFR
Anion gap: 7 (ref 5–15)
BUN: 10 mg/dL (ref 6–20)
CO2: 27 mmol/L (ref 22–32)
Calcium: 7.8 mg/dL — ABNORMAL LOW (ref 8.9–10.3)
Chloride: 109 mmol/L (ref 98–111)
Creatinine, Ser: 0.71 mg/dL (ref 0.44–1.00)
GFR, Estimated: >60 mL/min (ref >60)
Glucose, Bld: 86 mg/dL (ref 70–99)
Potassium: 3.2 mmol/L — ABNORMAL LOW (ref 3.5–5.1)
Sodium: 143 mmol/L (ref 135–145)

## 2024-04-17 MED ORDER — AMLODIPINE BESYLATE 5 MG PO TABS
5.0000 mg | ORAL_TABLET | Freq: Every day | ORAL | Status: DC
  Administered 2024-04-17: 5 mg via ORAL
  Filled 2024-04-17: qty 1

## 2024-04-17 NOTE — Progress Notes (Signed)
 Patient given discharge instructions. Patient given education on femoral incision site care and dressing changes. Patient's personal items accounted for and returned to patient. IV removed without issue. Patient left unit in wheelchair. Departed campus in friend's personal vehicle.

## 2024-04-17 NOTE — Discharge Summary (Signed)
 Patient ID: Diane [name] MRN: [id number] DOB/AGE: 1980/12/19 43 y.o.  Admit date: 04/15/2024 Discharge date: 04/17/2024  Supervising Physician: Dolphus Carrion  Patient Status: [facility] - In-pt  Admission Diagnoses: Right sided headaches, dizziness, carotid stenosis.  Discharge Diagnoses:  Principal Problem:   Carotid stenosis   Discharged Condition: good  Hospital Course: Diane [name] has as history of symptomatic high grade stenosis of the proximal cavernous right ICA with stent assisted angioplasty 10/15/22 by Dr. Dolphus. Diane [name] reported new right sided headaches and dizziness at recent follow up and decision was made to proceed with diagnostic cerebral angiogram with possible intervention.   Patient presented as an outpatient for planned procedure on 04/15/24. Diagnostic cerebral angiogram was performed via right common femoral artery without complication. Angiogram notable for fully patent right ICA stent and approximately 50-60% stenosis of the right vertebral artery in the V2 horizontal segment associated with mild fusiform dilatation, suspicious for fibromuscular dysplasia. No intervention was required and patient was admitted overnight for observation.  Overnight course uneventful per patient and RN. Diane [name] reports ongoing headache which is unchanged from presentation, Diane [name] denies any n/v, vision changes. Diane [name] was kept an additional night for dizziness; no concerns for further PT needs after PT eval this AM. No issues with eating/drinking. Discussed post discharge care instructions and follow up with patient who states understanding and is ready to go home.   Plan: - Discontinue foley, patient has urinated spontaneously since removal - Ambulate prior to discharge - performed with PT this AM.  - Continue ASA 81 mg every day + Plavix  75 mg every day - Maintain BP control. - Follow femoral site care discharge instructions; no bending/stooping/lifting for 2 weeks, limit use of stairs  when possible, hold pressure over right CFA when ambulating up stairs.  - May drive self 72 hours post procedure. - May return to work with restrictions on 04/19/24 (work note in discharge summary). - Keep follow up with Dr. Dolphus (scheduled for 04/22/24). - CTA head/neck in 6 months. Follow up with PCP/neurologist after resulted. - Maintain follow up with all other providers, please call [phone] with questions or concerns for Dr. Dolphus.  Consults: None  Significant Diagnostic Studies: IR US  Guide Vasc Access Right Result Date: 04/17/2024 CLINICAL DATA:  Previous history of right internal carotid artery distal petrous stent assisted angioplasty for symptomatic atherosclerotic disease. Accompanying significant stenosis of the proximal cavernous segment noted requiring balloon angioplasty. Most recent CTA suggestive of stenosis within the proximal left internal carotid artery distal to the stent. EXAM: BILATERAL COMMON CAROTID AND INNOMINATE ANGIOGRAPHY COMPARISON:  Previous arteriogram of October 16, 2023. MEDICATIONS: Heparin  3,000 units IV. No antibiotic was administered within 1 hour of the procedure. ANESTHESIA/SEDATION: General anesthesia. CONTRAST:  Omnipaque  300 approximately 70 mL. FLUOROSCOPY TIME:  Fluoroscopy Time: 9 minutes 30 seconds (598 mGy). COMPLICATIONS: None immediate. TECHNIQUE: Informed written consent was obtained from the patient after a thorough discussion of the procedural risks, benefits and alternatives. All questions were addressed. Maximal Sterile Barrier Technique was utilized including caps, mask, sterile gowns, sterile gloves, sterile drape, hand hygiene and skin antiseptic. A timeout was performed prior to the initiation of the procedure. The right groin was prepped and draped in the usual sterile fashion. Thereafter using modified Seldinger technique, transfemoral access into the right common femoral artery was obtained without difficulty. Over an 0.035 inch  guidewire, an 8 French 25 cm Pinnacle sheath was inserted. Through this, and also over an 0.035 inch guidewire, a 5 [place] JB  1 catheter was advanced to the aortic arch region and selectively positioned in the right common carotid artery, the right vertebral artery, the left common carotid artery and the left vertebral artery. FINDINGS: The innominate arteriogram demonstrates the innominate artery, the proximal right subclavian artery and the proximal right common carotid artery to be widely patent. The right common carotid arteriogram demonstrates the right external carotid artery and its major branches to be widely patent. The right internal carotid artery at the bulb to the cranial skull base demonstrates wide patency with a U-shaped configuration of the junction of the middle and the distal one thirds. Distal to this, the previously positioned stent in the distal petrous segment demonstrates wide patency without evidence of intra stent stenosis. Distal to this there is a smooth stenosis of the proximal cavernous left ICA measuring approximately 35% to 40%. No evidence of intraluminal filling defects noted. Distal to this the distal cavernous right ICA demonstrates mild stenosis. The supraclinoid right ICA is widely patent. The right middle cerebral artery opacifies into the capillary and venous phases. Unopacified blood is seen in the middle cerebral artery distribution from the posterior circulation from the ipsilateral vertebral artery via the posterior communicating artery. The left common carotid arteriogram demonstrates the left external carotid artery and its major branches to be widely patent. The left internal carotid artery demonstrates mild stenosis in its proximal 1/3. Moderate tortuosity is evident in the mid third of the cervical ICA. Distal to this the vessel assumes normal caliber. The petrous, the cavernous and the supraclinoid left ICA are widely patent. Left posterior communicating artery is  seen opacifying the left posterior cerebral artery distribution. The left middle cerebral artery and the left anterior cerebral artery opacify into the capillary and venous phases. Brisk cross-filling via the anterior communicating artery of the right anterior cerebral artery A2 segment and distally is noted. The dominant left vertebral artery origin is patent. The artery demonstrates diffuse fusiform dilatation to the level of the V3 segment with an occasional mild focal area of stenosis. The left vertebrobasilar junction and the left posterior-inferior cerebellar artery demonstrate wide patency. The basilar artery, the posterior cerebral arteries, the superior cerebellar arteries and the anterior-inferior cerebellar arteries opacify into the capillary and venous phases. Moderate stenosis of the right transverse sinus sigmoid sinus junction is noted. No delay of a cortical venous drainage is seen, however. Retrograde opacification of the right middle cerebral artery distribution via the right posterior communicating artery is seen. The right vertebral artery origin is widely patent. The vessel opacifies to the cranial skull base. There is 50-60% stenosis of the V2 segment of the right vertebral artery on the AP projection. This is associated with mild distal fusiform dilatation. Distal to this the vessel assumes normal caliber. The opacified portions of the basilar artery, the posterior cerebral arteries, the superior cerebellar arteries and of the anterior-inferior cerebellar arteries is seen into the capillary and venous phases. Unopacified blood is seen in the basilar artery from the contralateral vertebral artery. IMPRESSION: Patency of the previously positioned right ICA distal petrous stent without evidence of neointimal hyperplasia. Approximately 35-40% stenosis of the proximal cavernous right ICA secondary to a smooth atherosclerotic plaque. No evidence of plaque irregularity noted. Approximately 50% to 60%  stenosis of the non dominant right vertebral artery in the V2 segment. Finding could represent focal moderate fibromuscular dysplastic changes versus less likely focal dissection. Diffuse fusiform dilatation of the dominant left vertebral artery to the level of V3 with occasional mild  stenosis. This may represent a vessel off of the induced by chronic hypertension versus coexistent fibromuscular dysplasia. The patient's general anesthesia was reversed, and patient was extubated. Upon recovery, patient exhibited no new neurological changes. An 8 French Angio-Seal closure device was deployed for hemostasis at the right groin puncture site. Distal pulses remained present unchanged from prior to the procedure. Diane [name] overnight stay and observation was unremarkable. The following morning, the patient was assessed to be neurologically intact and discharged with specific instructions. Right groin appeared soft though mildly tender. No evidence of ecchymosis or hematoma noted. Pulses remained present unchanged. Diane [name] was then discharged home under the care of Diane [name] family. Patient advised to maintain adequate hydration. PLAN: Follow-up in the clinic in approximately 14 days. Patient advised to continue Diane [name] aspirin  81 mg a day, Plavix  75 mg a day and Diane [name] other prescribed medications. Patient expressed understanding and agreement with the above management plan. Electronically Signed   By: Thyra Nash M.D.   On: 04/17/2024 08:46   IR ANGIO INTRA EXTRACRAN SEL COM CAROTID INNOMINATE BILAT MOD SED Result Date: 04/17/2024 CLINICAL DATA:  Previous history of right internal carotid artery distal petrous stent assisted angioplasty for symptomatic atherosclerotic disease. Accompanying significant stenosis of the proximal cavernous segment noted requiring balloon angioplasty. Most recent CTA suggestive of stenosis within the proximal left internal carotid artery distal to the stent. EXAM: BILATERAL COMMON CAROTID AND INNOMINATE  ANGIOGRAPHY COMPARISON:  Previous arteriogram of October 16, 2023. MEDICATIONS: Heparin  3,000 units IV. No antibiotic was administered within 1 hour of the procedure. ANESTHESIA/SEDATION: General anesthesia. CONTRAST:  Omnipaque  300 approximately 70 mL. FLUOROSCOPY TIME:  Fluoroscopy Time: 9 minutes 30 seconds (598 mGy). COMPLICATIONS: None immediate. TECHNIQUE: Informed written consent was obtained from the patient after a thorough discussion of the procedural risks, benefits and alternatives. All questions were addressed. Maximal Sterile Barrier Technique was utilized including caps, mask, sterile gowns, sterile gloves, sterile drape, hand hygiene and skin antiseptic. A timeout was performed prior to the initiation of the procedure. The right groin was prepped and draped in the usual sterile fashion. Thereafter using modified Seldinger technique, transfemoral access into the right common femoral artery was obtained without difficulty. Over an 0.035 inch guidewire, an 8 French 25 cm Pinnacle sheath was inserted. Through this, and also over an 0.035 inch guidewire, a 5 [place] JB 1 catheter was advanced to the aortic arch region and selectively positioned in the right common carotid artery, the right vertebral artery, the left common carotid artery and the left vertebral artery. FINDINGS: The innominate arteriogram demonstrates the innominate artery, the proximal right subclavian artery and the proximal right common carotid artery to be widely patent. The right common carotid arteriogram demonstrates the right external carotid artery and its major branches to be widely patent. The right internal carotid artery at the bulb to the cranial skull base demonstrates wide patency with a U-shaped configuration of the junction of the middle and the distal one thirds. Distal to this, the previously positioned stent in the distal petrous segment demonstrates wide patency without evidence of intra stent stenosis. Distal to this  there is a smooth stenosis of the proximal cavernous left ICA measuring approximately 35% to 40%. No evidence of intraluminal filling defects noted. Distal to this the distal cavernous right ICA demonstrates mild stenosis. The supraclinoid right ICA is widely patent. The right middle cerebral artery opacifies into the capillary and venous phases. Unopacified blood is seen in the middle cerebral artery distribution from the posterior circulation  from the ipsilateral vertebral artery via the posterior communicating artery. The left common carotid arteriogram demonstrates the left external carotid artery and its major branches to be widely patent. The left internal carotid artery demonstrates mild stenosis in its proximal 1/3. Moderate tortuosity is evident in the mid third of the cervical ICA. Distal to this the vessel assumes normal caliber. The petrous, the cavernous and the supraclinoid left ICA are widely patent. Left posterior communicating artery is seen opacifying the left posterior cerebral artery distribution. The left middle cerebral artery and the left anterior cerebral artery opacify into the capillary and venous phases. Brisk cross-filling via the anterior communicating artery of the right anterior cerebral artery A2 segment and distally is noted. The dominant left vertebral artery origin is patent. The artery demonstrates diffuse fusiform dilatation to the level of the V3 segment with an occasional mild focal area of stenosis. The left vertebrobasilar junction and the left posterior-inferior cerebellar artery demonstrate wide patency. The basilar artery, the posterior cerebral arteries, the superior cerebellar arteries and the anterior-inferior cerebellar arteries opacify into the capillary and venous phases. Moderate stenosis of the right transverse sinus sigmoid sinus junction is noted. No delay of a cortical venous drainage is seen, however. Retrograde opacification of the right middle cerebral artery  distribution via the right posterior communicating artery is seen. The right vertebral artery origin is widely patent. The vessel opacifies to the cranial skull base. There is 50-60% stenosis of the V2 segment of the right vertebral artery on the AP projection. This is associated with mild distal fusiform dilatation. Distal to this the vessel assumes normal caliber. The opacified portions of the basilar artery, the posterior cerebral arteries, the superior cerebellar arteries and of the anterior-inferior cerebellar arteries is seen into the capillary and venous phases. Unopacified blood is seen in the basilar artery from the contralateral vertebral artery. IMPRESSION: Patency of the previously positioned right ICA distal petrous stent without evidence of neointimal hyperplasia. Approximately 35-40% stenosis of the proximal cavernous right ICA secondary to a smooth atherosclerotic plaque. No evidence of plaque irregularity noted. Approximately 50% to 60% stenosis of the non dominant right vertebral artery in the V2 segment. Finding could represent focal moderate fibromuscular dysplastic changes versus less likely focal dissection. Diffuse fusiform dilatation of the dominant left vertebral artery to the level of V3 with occasional mild stenosis. This may represent a vessel off of the induced by chronic hypertension versus coexistent fibromuscular dysplasia. The patient's general anesthesia was reversed, and patient was extubated. Upon recovery, patient exhibited no new neurological changes. An 8 French Angio-Seal closure device was deployed for hemostasis at the right groin puncture site. Distal pulses remained present unchanged from prior to the procedure. Diane [name] overnight stay and observation was unremarkable. The following morning, the patient was assessed to be neurologically intact and discharged with specific instructions. Right groin appeared soft though mildly tender. No evidence of ecchymosis or hematoma noted.  Pulses remained present unchanged. Diane [name] was then discharged home under the care of Diane [name] family. Patient advised to maintain adequate hydration. PLAN: Follow-up in the clinic in approximately 14 days. Patient advised to continue Diane [name] aspirin  81 mg a day, Plavix  75 mg a day and Diane [name] other prescribed medications. Patient expressed understanding and agreement with the above management plan. Electronically Signed   By: Thyra Nash M.D.   On: 04/17/2024 08:46   IR ANGIO VERTEBRAL SEL VERTEBRAL BILAT MOD SED Result Date: 04/17/2024 CLINICAL DATA:  Previous history of right internal carotid artery distal petrous  stent assisted angioplasty for symptomatic atherosclerotic disease. Accompanying significant stenosis of the proximal cavernous segment noted requiring balloon angioplasty. Most recent CTA suggestive of stenosis within the proximal left internal carotid artery distal to the stent. EXAM: BILATERAL COMMON CAROTID AND INNOMINATE ANGIOGRAPHY COMPARISON:  Previous arteriogram of October 16, 2023. MEDICATIONS: Heparin  3,000 units IV. No antibiotic was administered within 1 hour of the procedure. ANESTHESIA/SEDATION: General anesthesia. CONTRAST:  Omnipaque  300 approximately 70 mL. FLUOROSCOPY TIME:  Fluoroscopy Time: 9 minutes 30 seconds (598 mGy). COMPLICATIONS: None immediate. TECHNIQUE: Informed written consent was obtained from the patient after a thorough discussion of the procedural risks, benefits and alternatives. All questions were addressed. Maximal Sterile Barrier Technique was utilized including caps, mask, sterile gowns, sterile gloves, sterile drape, hand hygiene and skin antiseptic. A timeout was performed prior to the initiation of the procedure. The right groin was prepped and draped in the usual sterile fashion. Thereafter using modified Seldinger technique, transfemoral access into the right common femoral artery was obtained without difficulty. Over an 0.035 inch guidewire, an 8 French 25 cm  Pinnacle sheath was inserted. Through this, and also over an 0.035 inch guidewire, a 5 [place] JB 1 catheter was advanced to the aortic arch region and selectively positioned in the right common carotid artery, the right vertebral artery, the left common carotid artery and the left vertebral artery. FINDINGS: The innominate arteriogram demonstrates the innominate artery, the proximal right subclavian artery and the proximal right common carotid artery to be widely patent. The right common carotid arteriogram demonstrates the right external carotid artery and its major branches to be widely patent. The right internal carotid artery at the bulb to the cranial skull base demonstrates wide patency with a U-shaped configuration of the junction of the middle and the distal one thirds. Distal to this, the previously positioned stent in the distal petrous segment demonstrates wide patency without evidence of intra stent stenosis. Distal to this there is a smooth stenosis of the proximal cavernous left ICA measuring approximately 35% to 40%. No evidence of intraluminal filling defects noted. Distal to this the distal cavernous right ICA demonstrates mild stenosis. The supraclinoid right ICA is widely patent. The right middle cerebral artery opacifies into the capillary and venous phases. Unopacified blood is seen in the middle cerebral artery distribution from the posterior circulation from the ipsilateral vertebral artery via the posterior communicating artery. The left common carotid arteriogram demonstrates the left external carotid artery and its major branches to be widely patent. The left internal carotid artery demonstrates mild stenosis in its proximal 1/3. Moderate tortuosity is evident in the mid third of the cervical ICA. Distal to this the vessel assumes normal caliber. The petrous, the cavernous and the supraclinoid left ICA are widely patent. Left posterior communicating artery is seen opacifying the left  posterior cerebral artery distribution. The left middle cerebral artery and the left anterior cerebral artery opacify into the capillary and venous phases. Brisk cross-filling via the anterior communicating artery of the right anterior cerebral artery A2 segment and distally is noted. The dominant left vertebral artery origin is patent. The artery demonstrates diffuse fusiform dilatation to the level of the V3 segment with an occasional mild focal area of stenosis. The left vertebrobasilar junction and the left posterior-inferior cerebellar artery demonstrate wide patency. The basilar artery, the posterior cerebral arteries, the superior cerebellar arteries and the anterior-inferior cerebellar arteries opacify into the capillary and venous phases. Moderate stenosis of the right transverse sinus sigmoid sinus junction is noted.  No delay of a cortical venous drainage is seen, however. Retrograde opacification of the right middle cerebral artery distribution via the right posterior communicating artery is seen. The right vertebral artery origin is widely patent. The vessel opacifies to the cranial skull base. There is 50-60% stenosis of the V2 segment of the right vertebral artery on the AP projection. This is associated with mild distal fusiform dilatation. Distal to this the vessel assumes normal caliber. The opacified portions of the basilar artery, the posterior cerebral arteries, the superior cerebellar arteries and of the anterior-inferior cerebellar arteries is seen into the capillary and venous phases. Unopacified blood is seen in the basilar artery from the contralateral vertebral artery. IMPRESSION: Patency of the previously positioned right ICA distal petrous stent without evidence of neointimal hyperplasia. Approximately 35-40% stenosis of the proximal cavernous right ICA secondary to a smooth atherosclerotic plaque. No evidence of plaque irregularity noted. Approximately 50% to 60% stenosis of the non  dominant right vertebral artery in the V2 segment. Finding could represent focal moderate fibromuscular dysplastic changes versus less likely focal dissection. Diffuse fusiform dilatation of the dominant left vertebral artery to the level of V3 with occasional mild stenosis. This may represent a vessel off of the induced by chronic hypertension versus coexistent fibromuscular dysplasia. The patient's general anesthesia was reversed, and patient was extubated. Upon recovery, patient exhibited no new neurological changes. An 8 French Angio-Seal closure device was deployed for hemostasis at the right groin puncture site. Distal pulses remained present unchanged from prior to the procedure. Diane [name] overnight stay and observation was unremarkable. The following morning, the patient was assessed to be neurologically intact and discharged with specific instructions. Right groin appeared soft though mildly tender. No evidence of ecchymosis or hematoma noted. Pulses remained present unchanged. Diane [name] was then discharged home under the care of Diane [name] family. Patient advised to maintain adequate hydration. PLAN: Follow-up in the clinic in approximately 14 days. Patient advised to continue Diane [name] aspirin  81 mg a day, Plavix  75 mg a day and Diane [name] other prescribed medications. Patient expressed understanding and agreement with the above management plan. Electronically Signed   By: Thyra Nash M.D.   On: 04/17/2024 08:46   IR Radiologist Eval & Mgmt Result Date: 04/13/2024 EXAM: ESTABLISHED PATIENT OFFICE VISIT CHIEF COMPLAINT: 43 year old right handed lady who presents in follow-up to discuss the findings of Diane [name] most recent CT angiogram of the head and neck. Current Pain Level: 1-10 HISTORY OF PRESENT ILLNESS: The patient underwent uneventful endovascular revascularization of symptomatic high-grade stenosis of the proximal cavernous right ICA and stent assisted angioplasty of the distal petrous segment on 10/16/2023. There were no  periprocedural complications. The patient had a follow-up CT angiogram of the head and neck on 11/04/2023 which revealed a 3 mm filling defect questionable thrombus in the right supraclinoid ICA with questionable stenosis of the cavernous ICA just distal to the stent. Diane [name] most recent CT angiogram of 04/02/2024 suggests occlusion or near occlusion of the cavernous segment of the right internal carotid artery as per the radiology report. Clinically, the patient reports episodic sudden transient sharp pain in the right retro-orbital and supraorbital regions. During this time the patient reports no changes in Diane [name] vision or double vision. Diane [name] reports this at a frequency of 1-2 per day. Additionally, the patient reports a right-sided orbital frontal and frontal headache which Diane [name] has 5-6 per week. Diane [name] reports no neurological features of blindness, double vision or amaurosis fugax, or of left-sided numbness, tingling, weakness or incoordination. Diane [name]  reports no recent symptoms of loss of consciousness, or of seizure-like activity. These headaches are relieved significantly with Tylenol . Diane [name] remains functional in terms of Diane [name] daily routine activities including work. Denies any chest pain, shortness of breath, palpitations or pedal edema. Breathing is clear without wheezing or coughing. Appetite is normal. Weight is steady. No abdominal pain, constipation, diarrhea or melena. No abnormal urinary symptoms. No recent chills, fever or rigors. Diagnosis * : Date . * : ADD (attention deficit disorder) * : . * : Anemia * : . * : Anxiety * : . * : Crohn's disease (HCC) * : . * : D-dimer, elevated * : . * : Fibroid * : . * : Headache(784.0) * : * : otc meds - last migraine 2009 . * : Heart murmur * : * : as child, never had any problems . * : History of endometriosis * : . * : Hypertension * : . * : Leiomyoma * : 02/09/2015 . * : Low blood potassium * : * : history . * : Migraine without aura, with intractable migraine, so stated,  without mention of status migrainosus * : 06/19/2013 . * : Pneumonia * : * : x5 . * : PONV (postoperative nausea and vomiting) * : . * : Seasonal allergies * : . * : Vaginal Pap smear, abnormal * : . * : Vasovagal syncope * : : Past Surgical History: Procedure * : Laterality * : Date . * : ADENOIDECTOMY * : * : 2014 . * : CESAREAN SECTION * : N/A * : 04/14/2018 * : Procedure: PRIMARY CESAREAN SECTION; Surgeon: Edsel Norleen GAILS, MD; Location: [facility]; Service: Obstetrics; Laterality: N/A; . * : DILATATION & CURRETTAGE/HYSTEROSCOPY WITH RESECTOCOPE * : N/A * : 06/09/2013 * : Procedure: DILATATION & CURETTAGE/HYSTEROSCOPY WITH HYSTEROSCOPIC RESECTION OF SUBMUCOSAL FIBROID AND ENDOMETRIAL POLYP; Surgeon: Dickie DELENA Carder, MD; Location: [facility]; Service: Gynecology; Laterality: N/A; . * : IR ANGIO INTRA EXTRACRAN SEL COM CAROTID INNOMINATE BILAT MOD SED * : * : 09/30/2023 . * : IR ANGIO VERTEBRAL SEL SUBCLAVIAN INNOMINATE UNI L MOD SED * : * : 09/30/2023 . * : IR ANGIO VERTEBRAL SEL VERTEBRAL UNI R MOD SED * : * : 09/30/2023 . * : IR RADIOLOGIST EVAL & MGMT * : * : 09/30/2023 . * : IR RADIOLOGIST EVAL & MGMT * : * : 10/03/2023 . * : IR US  GUIDE VASC ACCESS RIGHT * : * : 09/30/2023 . * : LAPAROSCOPY * : N/A * : 06/09/2013 * : Procedure: LAPAROSCOPY DIAGNOSTIC; Surgeon: Dickie DELENA Carder, MD; Location: [facility]; Service: Gynecology; Laterality: N/A; . * : ROBOT ASSISTED MYOMECTOMY * : N/A * : 02/09/2015 * : Procedure: MYOMECTOMY,EXCISION OF ENDOMETRIOSIS,PRE-SACRAL NEURECTOMY; Surgeon: Cynthia Loss, MD; Location: [facility]; Service: Gynecology; Laterality: N/A; . * : ROBOTIC ASSISTED LAPAROSCOPIC LYSIS OF ADHESION * : N/A * : 06/09/2013 * : Procedure: ROBOTIC ASSISTED LAPAROSCOPIC LYSIS OF ADHESION; Resection of Endometriosis and excision of fibroid; Surgeon: Dickie DELENA Carder, MD; Location: [facility]; Service: Gynecology; Laterality: N/A; . * : TONSILLECTOMY * : * : 2014 . * : TYMPANOSTOMY TUBE PLACEMENT *  : * : 2014 Allergies: Patient has no known allergies. Medications: Prior to Admission medications Medication * : Sig * : Start Date * : End Date * : Taking? * : Authorizing Provider acetaminophen  (TYLENOL ) 325 MG tablet * : Take 2 tablets (650 mg total) by mouth every 4 (four) hours as needed (for pain  scale < 4).Patient not taking: Reported on 10/15/2023 * : 04/17/18 * : * : * : Izell Harari, MD ALPRAZolam  (XANAX ) 0.5 MG tablet * : Take 0.5 mg by mouth at bedtime. * : 10/10/23 * : * : * : [provider] amLODipine  (NORVASC ) 5 MG tablet * : Take 1 tablet (5 mg total) by mouth daily. * : 10/14/23 * : * : * : Paseda, Folashade R, FNP amphetamine-dextroamphetamine (ADDERALL) 30 MG tablet * : Take 30 mg by mouth daily.Patient not taking: Reported on 10/14/2023 * : * : * : * : [provider] aspirin  81 MG chewable tablet * : Chew 1 tablet (81 mg total) by mouth daily. * : 09/13/23 * : * : * BETHA Carita Senior, MD atorvastatin  (LIPITOR) 20 MG tablet * : Take 1 tablet (20 mg total) by mouth daily. * : 10/15/23 * : 10/14/24 * : * : Paseda, Folashade R, FNP cetirizine  (ZYRTEC  ALLERGY) 10 MG tablet * : Take 1 tablet (10 mg total) by mouth daily. * : 10/14/23 * : 04/11/24 * : * : Paseda, Folashade R, FNP clopidogrel  (PLAVIX ) 75 MG tablet * : Take 1 tablet (75 mg total) by mouth daily. * : 10/03/23 * : * : * : Bruning, Kevin, PA-C ELDERBERRY PO * : Take 2 tablets by mouth daily at 2 am. Gummy * : * : * : * : [provider] gabapentin  (NEURONTIN ) 100 MG capsule * : Take 1 capsule (100 mg total) by mouth 3 (three) times daily.Patient not taking: Reported on 10/14/2023 * : 09/28/23 * : * : * : Lang Norleen POUR, PA-C lidocaine  (LIDODERM ) 5 % * : Place 1 patch onto the skin daily. Remove & Discard patch within 12 hours or as directed by MDPatient not taking: Reported on 10/14/2023 * : 09/28/23 * : * : * : Jerral Meth, MD ondansetron  (ZOFRAN -ODT) 4 MG disintegrating tablet * : Take 1 tablet (4 mg total) by  mouth every 8 (eight) hours as needed for nausea or vomiting.Patient not taking: Reported on 10/14/2023 * : 09/15/23 * : * : * : Kommor, Madison, MD predniSONE  (DELTASONE ) 10 MG tablet * : Take 5 tablets (50 mg total) by mouth daily for 3 days, THEN 4 tablets (40 mg total) daily for 3 days, THEN 3 tablets (30 mg total) daily for 3 days, THEN 2 tablets (20 mg total) daily for 3 days, THEN 1 tablet (10 mg total) daily for 3 days, THEN 0.5 tablets (5 mg total) daily for 3 days.Patient not taking: Reported on 10/15/2023 * : 09/28/23 * : 10/16/23 * : * : Jerral Meth, MD Saline 0.65 % SOLN * : Place 1 spray into the nose daily. * : * : * : * : [provider] Family History Problem * : Relation * : Age of Onset . * : Heart disease * : Mother * : 40 * :     CHF . * : Crohn's disease * : Mother * : . * : Asthma * : Mother * : . * : Asthma * : Brother * : . * : Heart disease * : Maternal Aunt * : . * : Diabetes * : Maternal Aunt * : . * : Stroke * : Maternal Aunt * : . * : Heart disease * : Maternal Grandfather * : . * : Stroke * : Maternal Grandfather * : Socioeconomic History . * : Marital status: * : Divorced * : * :  Spouse name: * : Not on file . * : Number of children: * : 2 . * : Years of education: * : some coll. . * : Highest education level: * : Not on file Occupational History . * : Not on file Tobacco Use . * : Smoking status: * : Never . * : Smokeless tobacco: * : Never Vaping Use . * : Vaping status: * : Never Used Substance and Sexual Activity . * : Alcohol use: * : Not Currently . * : Drug use: * : No . * : Sexual activity: * : Yes * : * : Birth control/protection: * : None Other Topics * : Concern . * : Not on file Social History Narrative * : Patient does not drink caffeine. * : Patient is right handed. Food Insecurity: Not on file Transportation Needs: Not on file Physical Activity: Not on file Stress: Not on file Social Connections: Not on file Review of Systems: A 12 point ROS discussed and  pertinent positives are indicated in the HPI above. All other systems are negative. Review of Systems Constitutional: Negative for activity change, fatigue and fever. HENT: Negative for sneezing, sore throat and tinnitus. Eyes: Negative for visual disturbance. Respiratory: Negative for cough and shortness of breath. Cardiovascular: Negative for chest pain. Gastrointestinal: Negative for abdominal pain, diarrhea, nausea and vomiting. Musculoskeletal: Negative for back pain and gait problem. Neurological: Negative for dizziness, tremors, seizures, syncope, facial asymmetry, speech difficulty, weakness, light-headedness, numbness and headaches. Psychiatric/Behavioral: Negative for behavioral problems and confusion. Social History: No change. Family History:  No change. REVIEW OF SYSTEMS: Negative unless as mentioned above. PHYSICAL EXAMINATION: Alert, awake, oriented to time, place, space. Speech and comprehension intact. No abnormal lateralizing neurological features. Station and gait normal. ASSESSMENT AND PLAN: Most recent CT angiogram of the head and neck were reviewed with the patient. Brought to Diane [name] attention was the faint contrast enhancement just distal to the previously positioned stent in the distal petrous segment. Question was the intra stent stenosis persists worsening atherosclerotic disease distal to this. Following further discussions, in order to ascertain adequately the status of the right ICA in the treated region, a formal diagnostic catheter arteriogram would be appropriate. The patient is familiar with this procedure. Should a high-grade stenosis be discovered, endovascular treatment with balloon angioplasty would be the viable option. The patient is aware of the risks of the procedure of 1-2% chance of new ischemic stroke, renal dysfunction related to contrast media, infection, bleeding, hematoma at the access site but not limited to these. The patient would like to proceed with a diagnostic  catheter arteriogram with intent to treat under anesthesia. Hopefully this can be scheduled as soon as possible. Should the patient develop stroke-like symptoms, patient advised to call [phone]. Patient also advised to continue all Diane [name] medications, and maintain adequate hydration. Diane [name] leaves with good understanding and agreement with the above management plan. Electronically Signed   By: Thyra Nash M.D.   On: 04/13/2024 08:58   CT ANGIO HEAD NECK W WO CM Result Date: 04/02/2024 CLINICAL DATA:  Follow-up carotid thrombus EXAM: CT ANGIOGRAPHY HEAD AND NECK WITH AND WITHOUT CONTRAST TECHNIQUE: Multidetector CT imaging of the head and neck was performed using the standard protocol during bolus administration of intravenous contrast. Multiplanar CT image reconstructions and MIPs were obtained to evaluate the vascular anatomy. Carotid stenosis measurements (when applicable) are obtained utilizing NASCET criteria, using the distal internal carotid diameter as the denominator. RADIATION DOSE REDUCTION: This exam was  performed according to the departmental dose-optimization program which includes automated exposure control, adjustment of the mA and/or kV according to patient size and/or use of iterative reconstruction technique. CONTRAST:  75mL OMNIPAQUE  IOHEXOL  350 MG/ML SOLN COMPARISON:  November 04, 2023 FINDINGS: CT Head: There is no hemorrhage. No acute ischemic changes. No mass lesion. The ventricles are normal. Skull/sinuses/orbits: No significant abnormality. CTA NECK: CTA NECK Aortic arch: No proximal vessel stenosis. Right carotid: The carotid bifurcation is normal. The cervical internal carotid artery is relatively small. Left carotid: Normal Right vertebral: Normal Left vertebral: Normal Soft tissues: No significant abnormality Other comments: None CTA HEAD: CTA HEAD Right anterior circulation: There is a nearly occlusive stenosis of the cavernous segment of the right internal carotid artery. The thrombus  noted in the carotid terminus on the prior study has resolved. The a 1 segment is small. The anterior cerebral artery and middle cerebral artery are patent without proximal branch occlusion Left anterior circulation: The internal carotid artery is patent without significant stenosis. The anterior and middle cerebral arteries are patent without significant stenosis or proximal branch occlusion. No aneurysm. Both anterior cerebral arteries arise predominantly from the left side. Posterior circulation: Both vertebral arteries are patent. There is no significant basilar stenosis. Both posterior cerebral arteries are patent without significant stenosis or proximal branch occlusion. No aneurysm. IMPRESSION: 1. No extracranial carotid artery stenosis 2. Occlusion or near occlusion within the cavernous segment of the right internal carotid artery 3. The thrombus noted in the right carotid terminus on the prior study has resolved Electronically Signed   By: Nancyann Burns M.D.   On: 04/02/2024 12:31    Treatments: Cerebral angiogram without intervention, IVF  Discharge Exam: Blood pressure 132/84, pulse 73, temperature 98.4 F (36.9 C), temperature source Oral, resp. rate 16, height 5' 7.75 (1.721 m), weight 211 lb (95.7 kg), last menstrual period 04/12/2024, SpO2 100%. Physical Exam Vitals and nursing note reviewed.  Constitutional:      General: Diane [name] is not in acute distress. HENT:     Head: Normocephalic.     Mouth/Throat:     Mouth: Mucous membranes are moist.     Pharynx: Oropharynx is clear.  Cardiovascular:     Rate and Rhythm: Normal rate.     Comments: (+) Right CFA puncture site clean, dry, soft, mildly tender to palpation. Mild bruising. No erythema, edema or significant bruising.  Pulmonary:     Effort: Pulmonary effort is normal.  Skin:    General: Skin is warm and dry.  Neurological:     Mental Status: Diane [name] is alert and oriented to person, place, and time.  Psychiatric:        Mood and  Affect: Mood normal.        Behavior: Behavior normal.        Thought Content: Thought content normal.        Judgment: Judgment normal.     Disposition:    Allergies as of 04/17/2024       Reactions   Chlorhexidine  Itching   Patient also had mild redness and hives in one location.  Recommended to pre medicate with Benadryl  if needed.        Medication List     TAKE these medications    acetaminophen  500 MG tablet Commonly known as: TYLENOL  Take 1,000 mg by mouth every 6 (six) hours as needed for mild pain (pain score 1-3) or headache.   ALPRAZolam  0.5 MG tablet Commonly known as: XANAX  Take 0.5 mg  by mouth at bedtime as needed for anxiety. What changed: Another medication with the same name was removed. Continue taking this medication, and follow the directions you see here.   amLODipine  5 MG tablet Commonly known as: NORVASC  TAKE 1 TABLET (5 MG TOTAL) BY MOUTH DAILY.   aspirin  81 MG chewable tablet Chew 1 tablet (81 mg total) by mouth daily.   atorvastatin  20 MG tablet Commonly known as: LIPITOR TAKE 1 TABLET BY MOUTH EVERY DAY What changed: when to take this   cetirizine  10 MG tablet Commonly known as: ZYRTEC  TAKE 1 TABLET BY MOUTH EVERY DAY   clopidogrel  75 MG tablet Commonly known as: Plavix  Take 1 tablet (75 mg total) by mouth daily.   ELDERBERRY PO Take 2 tablets by mouth daily.   OVER THE COUNTER MEDICATION Take 1 tablet by mouth. Calcium  supplement daily--unknown dose   polyethylene glycol 17 g packet Commonly known as: MIRALAX  / GLYCOLAX  Take 17 g by mouth daily.   Qnasl  80 MCG/ACT Aers Generic drug: Beclomethasone Dipropionate  Place 2 sprays into the nose daily. What changed:  when to take this reasons to take this         Follow-up Information     Dolphus Carrion, MD Follow up.   Specialties: Interventional Radiology, Radiology Why: Please call [phone] with any questions or concerns before your next appointment. Contact  information: [address] [place] [address] [phone]                  Electronically Signed: Laymon Coast, NP 04/17/2024, 10:32 AM   I have spent Greater Than 30 Minutes discharging Diane [name].

## 2024-04-17 NOTE — Evaluation (Signed)
 Physical Therapy Evaluation and d/c  Patient Details Name: Diane [name] MRN: [id number] DOB: 1980/09/26 Today's Date: 04/17/2024  History of Present Illness  43 yo female s/p 4 vessel cerebral arteriogram given headaches and dizziness 04/17/24. Angiogram notable for fully patent right ICA stent and approximately 50-60% stenosis of the right vertebral artery in the V2 horizontal segment associated with mild fusiform dilatation, suspicious for fibromuscular dysplasia; no intervention. PMH includes R common carotid arteriogarm with balloon and stent assisted angioplasty 10/16/2023, ADD, anxiety, crohn's disease, PNA, history of vasovagal syncope.  Clinical Impression   Pt presents with mild groin pain s/p arteriogram, intermittent dizziness with mobility, and decreased activity tolerance vs baseline. Pt tolerated short distance hallway gait with and without RW, RW makes pt feel more secure. Pt with swimmyheadedness intermittently during session, is not exacerbated by anything in particular and VSS (see previous documentation). Pt navigated stairs, demonstrating ability to go upstairs at her sister's house upon d/c. Pt understands groin precautions and precautions related to dizziness. Appropriate to d/c from PT standpoint, anticipate good progress post-acutely.          If plan is discharge home, recommend the following:     Can travel by private vehicle        Equipment Recommendations None recommended by PT  Recommendations for Other Services       Functional Status Assessment Patient has had a recent decline in their functional status and demonstrates the ability to make significant improvements in function in a reasonable and predictable amount of time.     Precautions / Restrictions Precautions Precautions: Fall Restrictions Weight Bearing Restrictions Per Provider Order: No      Mobility  Bed Mobility Overal bed mobility: Needs Assistance Bed Mobility: Supine to Sit, Sit to  Supine     Supine to sit: Supervision Sit to supine: Supervision   General bed mobility comments: increased time, suspect due to pt guarding given anxiety about dizziness    Transfers Overall transfer level: Needs assistance Equipment used: None Transfers: Sit to/from Stand, Bed to chair/wheelchair/BSC Sit to Stand: Supervision Stand pivot transfers: Supervision         General transfer comment: for safety, stand pivot to/from [facility] with set up and supervision for safety    Ambulation/Gait Ambulation/Gait assistance: Supervision Gait Distance (Feet): 70 Feet Assistive device: None, Rolling walker (2 wheels) Gait Pattern/deviations: Step-through pattern, Decreased stride length, Trunk flexed Gait velocity: decr     General Gait Details: slowed, guarded, x1 period of stopping due to swimmy-headedness. use of RW after first20 ft for pt comfort and balance assist as needed  Stairs Stairs: Yes Stairs assistance: Contact guard assist Stair Management: One rail Right, Sideways, Step to pattern Number of Stairs: 5 General stair comments: cues for holding pressure on groin site, single UE use on railing, descending with RLE leading to keep RLE relatively straight. close guard for safety  Wheelchair Mobility     Tilt Bed    Modified Rankin (Stroke Patients Only)       Balance Overall balance assessment: Needs assistance Sitting-balance support: No upper extremity supported, Feet supported Sitting balance-Leahy Scale: Good     Standing balance support: No upper extremity supported, During functional activity Standing balance-Leahy Scale: Good Standing balance comment: can ambulate without AD, uses AD for balance and confidence                             Pertinent Vitals/Pain Pain Assessment  Pain Assessment: Faces Faces Pain Scale: Hurts little more Pain Location: R groin Pain Descriptors / Indicators: Discomfort, Operative site guarding,  Grimacing Pain Intervention(s): Limited activity within patient's tolerance, Monitored during session, Repositioned    Home Living Family/patient expects to be discharged to:: Private residence Living Arrangements: Children Available Help at Discharge: Family Type of Home: Apartment Home Access: Stairs to enter Entrance Stairs-Rails: Doctor, general practice of Steps: flight   Home Layout: One level Home Equipment: Agricultural consultant (2 wheels);BSC/3in1      Prior Function Prior Level of Function : Independent/Modified Independent                     Extremity/Trunk Assessment   Upper Extremity Assessment Upper Extremity Assessment: Overall WFL for tasks assessed    Lower Extremity Assessment Lower Extremity Assessment: Overall WFL for tasks assessed    Cervical / Trunk Assessment Cervical / Trunk Assessment: Normal  Communication   Communication Communication: No apparent difficulties    Cognition Arousal: Alert Behavior During Therapy: WFL for tasks assessed/performed   PT - Cognitive impairments: No apparent impairments                         Following commands: Intact       Cueing Cueing Techniques: Verbal cues     General Comments General comments (skin integrity, edema, etc.): orthostatic vitals negative for orthostatic hypotension. PT educated pt on importance of sitting down if feeling dizzy and not progressing gait. Pt is nervous it will happen on stairs, PT encouraged pt's sister to assist her on stairs and if she feels dizzy to sit down on stairs until it passes pt expresses understanding    Exercises     Assessment/Plan    PT Assessment Patient does not need any further PT services  PT Problem List         PT Treatment Interventions      PT Goals (Current goals can be found in the Care Plan section)  Acute Rehab PT Goals Patient Stated Goal: home PT Goal Formulation: With patient Time For Goal Achievement:  04/17/24 Potential to Achieve Goals: Good    Frequency       Co-evaluation               AM-PAC PT 6 Clicks Mobility  Outcome Measure Help needed turning from your back to your side while in a flat bed without using bedrails?: None Help needed moving from lying on your back to sitting on the side of a flat bed without using bedrails?: None Help needed moving to and from a bed to a chair (including a wheelchair)?: None Help needed standing up from a chair using your arms (e.g., wheelchair or bedside chair)?: A Little Help needed to walk in hospital room?: A Little Help needed climbing 3-5 steps with a railing? : A Little 6 Click Score: 21    End of Session   Activity Tolerance: Patient tolerated treatment well Patient left: in bed;with call Amend/phone within reach Nurse Communication: Mobility status PT Visit Diagnosis: Other abnormalities of gait and mobility (R26.89)    Time: 0911-0950 PT Time Calculation (min) (ACUTE ONLY): 39 min   Charges:   PT Evaluation $PT Eval Low Complexity: 1 Low PT Treatments $Gait Training: 8-22 mins $Therapeutic Activity: 8-22 mins PT General Charges $$ ACUTE PT VISIT: 1 Visit         Basilio Meadow S, PT DPT Acute Rehabilitation Services Secure Chat Preferred  Office [phone]   Diane [name] 04/17/2024, 10:09 AM

## 2024-04-17 NOTE — Progress Notes (Signed)
   04/17/24 0900  Orthostatic Lying   BP- Lying 142/88  Pulse- Lying 85  Orthostatic Sitting  BP- Sitting 144/89  Pulse- Sitting 76  Orthostatic Standing at 0 minutes  BP- Standing at 0 minutes 143/87  Pulse- Standing at 0 minutes 87  Orthostatic Standing at 3 minutes  BP- Standing at 3 minutes (!) 134/95  Pulse- Standing at 3 minutes 93   Connee Ikner S, PT DPT Acute Arts development officer Preferred  Office [phone]

## 2024-04-22 ENCOUNTER — Other Ambulatory Visit (HOSPITAL_COMMUNITY): Payer: Self-pay | Admitting: Radiology

## 2024-04-22 ENCOUNTER — Ambulatory Visit (HOSPITAL_COMMUNITY)
Admission: RE | Admit: 2024-04-22 | Discharge: 2024-04-22 | Disposition: A | Discharge status: Home / Self Care | Source: Ambulatory Visit | Attending: Interventional Radiology | Admitting: Interventional Radiology

## 2024-04-22 DIAGNOSIS — I771 Stricture of artery: Secondary | ICD-10-CM

## 2024-04-22 MED ORDER — CLOPIDOGREL BISULFATE 75 MG PO TABS: 75.0000 mg | ORAL_TABLET | Freq: Every day | ORAL | 4 refills | Status: AC

## 2024-04-24 HISTORY — PX: IR RADIOLOGIST EVAL & MGMT: IMG5224

## 2024-04-28 ENCOUNTER — Encounter (HOSPITAL_COMMUNITY): Payer: Self-pay

## 2024-04-28 ENCOUNTER — Encounter: Payer: Self-pay | Admitting: Student

## 2024-05-04 ENCOUNTER — Ambulatory Visit: Payer: Self-pay | Admitting: Nurse Practitioner

## 2024-05-05 ENCOUNTER — Encounter: Payer: Self-pay | Admitting: Nurse Practitioner

## 2024-05-05 ENCOUNTER — Ambulatory Visit (INDEPENDENT_AMBULATORY_CARE_PROVIDER_SITE_OTHER): Payer: Self-pay | Admitting: Nurse Practitioner

## 2024-05-05 VITALS — BP 133/76 | HR 78 | Temp 97.5°F | Wt 216.0 lb

## 2024-05-05 DIAGNOSIS — R519 Headache, unspecified: Secondary | ICD-10-CM

## 2024-05-05 DIAGNOSIS — I6521 Occlusion and stenosis of right carotid artery: Secondary | ICD-10-CM

## 2024-05-05 DIAGNOSIS — I10 Essential (primary) hypertension: Secondary | ICD-10-CM

## 2024-05-05 DIAGNOSIS — E669 Obesity, unspecified: Secondary | ICD-10-CM | POA: Diagnosis not present

## 2024-05-05 DIAGNOSIS — E785 Hyperlipidemia, unspecified: Secondary | ICD-10-CM | POA: Diagnosis not present

## 2024-05-05 DIAGNOSIS — E876 Hypokalemia: Secondary | ICD-10-CM

## 2024-05-05 NOTE — Assessment & Plan Note (Signed)
 Wt Readings from Last 3 Encounters:  05/05/24 216 lb (98 kg)  04/15/24 211 lb (95.7 kg)  03/30/24 213 lb 6.4 oz (96.8 kg)   Body mass index is 33.09 kg/m.   Obesity with no recent weight loss. Recent surgery impacted physical activity. - Encourage moderate to vigorous exercise for 30 minutes, five days a week. - Advise dietary modifications focusing on more protein and carbohydrates, reducing processed foods.

## 2024-05-05 NOTE — Assessment & Plan Note (Signed)
 Headache has since resolved Will refer to neurology if she continues to have headaches

## 2024-05-05 NOTE — Assessment & Plan Note (Signed)
 Lab Results  Component Value Date   NA 143 04/17/2024   K 3.2 (L) 04/17/2024   CO2 27 04/17/2024   GLUCOSE 86 04/17/2024   BUN 10 04/17/2024   CREATININE 0.71 04/17/2024   CALCIUM  7.8 (L) 04/17/2024   GFR 128.20 10/22/2011   EGFR 111 11/22/2023   GFRNONAA >60 04/17/2024    Hypokalemia with recent low potassium levels post-hospitalization. - Order repeat labs to check potassium levels. - Encourage continued consumption of potassium-rich foods.

## 2024-05-05 NOTE — Assessment & Plan Note (Signed)
 Lab Results  Component Value Date   NA 143 04/17/2024   K 3.2 (L) 04/17/2024   CO2 27 04/17/2024   GLUCOSE 86 04/17/2024   BUN 10 04/17/2024   CREATININE 0.71 04/17/2024   CALCIUM  7.8 (L) 04/17/2024   GFR 128.20 10/22/2011   EGFR 111 11/22/2023   GFRNONAA >60 04/17/2024   Hypocalcemia noted with recent drop in calcium  levels post-hospitalization. - Continue calcium  supplementation. - Order repeat labs to check calcium  levels.

## 2024-05-05 NOTE — Patient Instructions (Signed)

## 2024-05-05 NOTE — Assessment & Plan Note (Signed)
 Continue Crestor 20 mg daily Rechecking lipid panel Lab Results  Component Value Date   CHOL 110 11/22/2023   HDL 49 11/22/2023   LDLCALC 51 11/22/2023   LDLDIRECT 135 (H) 10/14/2023   TRIG 39 11/22/2023   CHOLHDL 2.2 11/22/2023

## 2024-05-05 NOTE — Assessment & Plan Note (Signed)
 BP Readings from Last 3 Encounters:  05/05/24 133/76  04/17/24 127/73  03/30/24 133/83   Hypertension well-controlled with home readings below 130/80 mmHg. Recent elevated blood pressure attributed to irritation. - Continue amlodipine  5 mg oral daily. - Continue lifestyle modifications including low-sodium diet and weight management. - Encourage moderate to vigorous exercise for 30 minutes, five days a week.

## 2024-05-05 NOTE — Assessment & Plan Note (Addendum)
 recent Angiogram notable for fully patent right ICA stent and approximately 50-60% stenosis of the right vertebral artery in the V2 horizontal segment associated with mild fusiform dilatation, suspicious for fibromuscular dysplasia. No intervention was required and patient was admitted overnight for observation.   Carotid artery stenosis currently managed with medical treatment. Recent imaging showed soft plaque and improvement compared to January. - Refer to vascular for further evaluation and management.  States that Dr. Lorean is leaving [place] - Continue aspirin  81 mg daily and Plavix  75 mg daily, atorvastatin  20 mg daily.

## 2024-05-05 NOTE — Progress Notes (Signed)
 Established Patient Office Visit  Subjective:  Patient ID: Diane [name], female    DOB: 03-Aug-1981  Age: 43 y.o. MRN: [id number]  CC:  Chief Complaint  Patient presents with   Hypertension    HPI   Discussed the use of AI scribe software for clinical note transcription with the patient, who gave verbal consent to proceed.  History of Present Illness Diane [name] is a 43 year old female  has a past medical history of ADD (attention deficit disorder), Anemia, Anxiety, Crohn's disease (HCC), D-dimer, elevated, Fibroid, Headache(784.0), Heart murmur, History of endometriosis, Hypertension, Leiomyoma (02/09/2015), Low blood potassium, Migraine without aura, with intractable migraine, so stated, without mention of status migrainosus (06/19/2013), Pneumonia, PONV (postoperative nausea and vomiting), Seasonal allergies, Vaginal Pap smear, abnormal, and Vasovagal syncope.  Patient  presents for follow-up for her chronic medical conditions   She underwent a CT scan on July 10th, which led to a surgery scheduled on July 23rd for her carotid stenosis, previously noted to be 95% occluded in December. The plaque in her artery was described as soft, and she recalls the doctor was impressed with the artery compared to what it looked like in January.  She continues on Plavix  75 mg daily and aspirin  81 mg daily  Her blood pressure has been stable with readings below 130/80 mmHg over the past three weeks, although she noted a spike to 145/69 mmHg on the morning of the visit, which she attributes to being 'severely irritated'. She continues to take amlodipine , baby aspirin , Plavix , and cholesterol medication. She has discontinued Xanax , which was previously taken as needed.  Takes amlodipine  5 mg daily  She reports low potassium levels noted during her recent hospital stay. She is addressing this by consuming potassium-rich foods like cantaloupe and spinach, and drinks Body Armor beverages for  additional potassium. Her calcium  levels also dropped during her hospital stay as she was not given supplements there. She resumed her calcium  intake upon returning home.  She experiences headaches, which she associates with elevated blood pressure. No current headaches, but acknowledges their recurrence when her blood pressure was elevated. No muscle cramps or spasms currently.  She has been inactive due to her recent surgery and recovery, having only returned to work last week. She acknowledges the need to improve her physical activity and dietary habits, particularly reducing processed foods.      Past Medical History:  Diagnosis Date   ADD (attention deficit disorder)    Anemia    Anxiety    Crohn's disease (HCC)    D-dimer, elevated    Fibroid    Headache(784.0)    otc meds - last migraine 2009   Heart murmur    as child, never had any problems   History of endometriosis    Hypertension    Leiomyoma 02/09/2015   Low blood potassium    history   Migraine without aura, with intractable migraine, so stated, without mention of status migrainosus 06/19/2013   Pneumonia    x5   PONV (postoperative nausea and vomiting)    Seasonal allergies    Vaginal Pap smear, abnormal    Vasovagal syncope     Past Surgical History:  Procedure Laterality Date   ADENOIDECTOMY  2014   CESAREAN SECTION N/A 04/14/2018   Procedure: PRIMARY CESAREAN SECTION;  Surgeon: Edsel Norleen GAILS, MD;  Location: [facility];  Service: Obstetrics;  Laterality: N/A;   DILATATION & CURRETTAGE/HYSTEROSCOPY WITH RESECTOCOPE N/A 06/09/2013   Procedure:  DILATATION & CURETTAGE/HYSTEROSCOPY WITH HYSTEROSCOPIC RESECTION OF SUBMUCOSAL FIBROID AND ENDOMETRIAL POLYP;  Surgeon: Dickie DELENA Carder, MD;  Location: [facility] OR[facility];  Service: Gynecology;  Laterality: N/A;   IR ANGIO INTRA EXTRACRAN SEL COM CAROTID INNOMINATE BILAT MOD SED  09/30/2023   IR ANGIO INTRA EXTRACRAN SEL COM CAROTID INNOMINATE BILAT MOD SED   04/15/2024   IR ANGIO INTRA EXTRACRAN SEL INTERNAL CAROTID UNI R MOD SED  10/16/2023   IR ANGIO VERTEBRAL SEL SUBCLAVIAN INNOMINATE UNI L MOD SED  09/30/2023   IR ANGIO VERTEBRAL SEL VERTEBRAL BILAT MOD SED  04/15/2024   IR ANGIO VERTEBRAL SEL VERTEBRAL UNI R MOD SED  09/30/2023   IR CT HEAD LTD  10/16/2023   IR INTRA CRAN STENT  10/16/2023   IR RADIOLOGIST EVAL & MGMT  09/30/2023   IR RADIOLOGIST EVAL & MGMT  10/03/2023   IR RADIOLOGIST EVAL & MGMT  10/31/2023   IR RADIOLOGIST EVAL & MGMT  04/13/2024   IR RADIOLOGIST EVAL & MGMT  04/24/2024   IR US  GUIDE VASC ACCESS RIGHT  09/30/2023   IR US  GUIDE VASC ACCESS RIGHT  04/15/2024   LAPAROSCOPY N/A 06/09/2013   Procedure: LAPAROSCOPY DIAGNOSTIC;  Surgeon: Dickie DELENA Carder, MD;  Location: [facility];  Service: Gynecology;  Laterality: N/A;   RADIOLOGY WITH ANESTHESIA N/A 10/16/2023   Procedure: endovascular treatment of the right internal carotid artery tandem intracranial stenosis;  Surgeon: Dolphus Carrion, MD;  Location: [facility] OR;  Service: Radiology;  Laterality: N/A;   RADIOLOGY WITH ANESTHESIA N/A 04/15/2024   Procedure: RADIOLOGY WITH ANESTHESIA;  Surgeon: Dolphus Carrion, MD;  Location: [facility] OR;  Service: Radiology;  Laterality: N/A;  Diagnostic angiogram w/intent to treat   ROBOT ASSISTED MYOMECTOMY N/A 02/09/2015   Procedure: MYOMECTOMY,EXCISION OF ENDOMETRIOSIS,PRE-SACRAL NEURECTOMY;  Surgeon: Cynthia Loss, MD;  Location: [facility];  Service: Gynecology;  Laterality: N/A;   ROBOTIC ASSISTED LAPAROSCOPIC LYSIS OF ADHESION N/A 06/09/2013   Procedure: ROBOTIC ASSISTED LAPAROSCOPIC LYSIS OF ADHESION; Resection of Endometriosis and excision of fibroid;  Surgeon: Dickie DELENA Carder, MD;  Location: [facility];  Service: Gynecology;  Laterality: N/A;   TONSILLECTOMY  2014   TYMPANOSTOMY TUBE PLACEMENT  2014    Family History  Problem Relation Age of Onset   Heart disease Mother 54       CHF   Crohn's disease Mother    Asthma Mother    Asthma  Brother    Heart disease Maternal Aunt    Diabetes Maternal Aunt    Stroke Maternal Aunt    Heart disease Maternal Grandfather    Stroke Maternal Grandfather     Social History   Socioeconomic History   Marital status: Divorced    Spouse name: Not on file   Number of children: 2   Years of education: some coll.   Highest education level: Not on file  Occupational History   Not on file  Tobacco Use   Smoking status: Never   Smokeless tobacco: Never  Vaping Use   Vaping status: Never Used  Substance and Sexual Activity   Alcohol use: Yes    Comment: occas   Drug use: No   Sexual activity: Yes    Birth control/protection: None  Other Topics Concern   Not on file  Social History Narrative   Patient does not drink caffeine.   Patient is right handed.    Social Drivers of Corporate investment banker Strain: Not on file  Food Insecurity: No Food Insecurity (04/15/2024)  Hunger Vital Sign    Worried About Running Out of Food in the Last Year: Never true    Ran Out of Food in the Last Year: Never true  Transportation Needs: No Transportation Needs (04/15/2024)   PRAPARE - Administrator, Civil Service (Medical): No    Lack of Transportation (Non-Medical): No  Physical Activity: Not on file  Stress: Not on file  Social Connections: Unknown (04/15/2024)   Social Connection and Isolation Panel    Frequency of Communication with Friends and Family: Three times a week    Frequency of Social Gatherings with Friends and Family: Three times a week    Attends Religious Services: More than 4 times per year    Active Member of Clubs or Organizations: No    Attends Banker Meetings: Never    Marital Status: Patient unable to answer  Intimate Partner Violence: Not At Risk (04/15/2024)   Humiliation, Afraid, Rape, and Kick questionnaire    Fear of Current or Ex-Partner: No    Emotionally Abused: No    Physically Abused: No    Sexually Abused: No     Outpatient Medications Prior to Visit  Medication Sig Dispense Refill   amLODipine  (NORVASC ) 5 MG tablet TAKE 1 TABLET (5 MG TOTAL) BY MOUTH DAILY. 90 tablet 0   aspirin  81 MG chewable tablet Chew 1 tablet (81 mg total) by mouth daily. 30 tablet 0   atorvastatin  (LIPITOR) 20 MG tablet TAKE 1 TABLET BY MOUTH EVERY DAY 90 tablet 1   cetirizine  (ZYRTEC ) 10 MG tablet TAKE 1 TABLET BY MOUTH EVERY DAY 90 tablet 1   clopidogrel  (PLAVIX ) 75 MG tablet Take 1 tablet (75 mg total) by mouth daily. 90 tablet 4   polyethylene glycol (MIRALAX  / GLYCOLAX ) 17 g packet Take 17 g by mouth daily.     acetaminophen  (TYLENOL ) 500 MG tablet Take 1,000 mg by mouth every 6 (six) hours as needed for mild pain (pain score 1-3) or headache.     ALPRAZolam  (XANAX ) 0.5 MG tablet Take 0.5 mg by mouth at bedtime as needed for anxiety. (Patient not taking: Reported on 05/05/2024)     Beclomethasone Dipropionate  (QNASL ) 80 MCG/ACT AERS Place 2 sprays into the nose daily. (Patient taking differently: Place 2 sprays into the nose daily as needed (for allergies).) 10.6 g 1   ELDERBERRY PO Take 2 tablets by mouth daily.     OVER THE COUNTER MEDICATION Take 1 tablet by mouth. Calcium  supplement daily--unknown dose     No facility-administered medications prior to visit.    Allergies  Allergen Reactions   Chlorhexidine  Itching    Patient also had mild redness and hives in one location.  Recommended to pre medicate with Benadryl  if needed.    ROS Review of Systems  Constitutional:  Negative for appetite change, chills, fatigue and fever.  HENT:  Negative for congestion, postnasal drip, rhinorrhea and sneezing.   Respiratory:  Negative for cough, shortness of breath and wheezing.   Cardiovascular:  Negative for chest pain, palpitations and leg swelling.  Gastrointestinal:  Negative for abdominal pain, constipation, nausea and vomiting.  Genitourinary:  Negative for difficulty urinating, dysuria, flank pain and frequency.   Musculoskeletal:  Negative for arthralgias, back pain, joint swelling and myalgias.  Skin:  Negative for color change, pallor, rash and wound.  Neurological:  Negative for dizziness, facial asymmetry, weakness, numbness and headaches.  Psychiatric/Behavioral:  Negative for behavioral problems, confusion, self-injury and suicidal ideas.  Objective:    Physical Exam Vitals and nursing note reviewed.  Constitutional:      General: She is not in acute distress.    Appearance: Normal appearance. She is obese. She is not ill-appearing, toxic-appearing or diaphoretic.  Eyes:     General: No scleral icterus.       Right eye: No discharge.        Left eye: No discharge.     Extraocular Movements: Extraocular movements intact.     Conjunctiva/sclera: Conjunctivae normal.  Cardiovascular:     Rate and Rhythm: Normal rate and regular rhythm.     Pulses: Normal pulses.     Heart sounds: Normal heart sounds. No murmur heard.    No friction rub. No gallop.  Pulmonary:     Effort: Pulmonary effort is normal. No respiratory distress.     Breath sounds: Normal breath sounds. No stridor. No wheezing, rhonchi or rales.  Chest:     Chest wall: No tenderness.  Abdominal:     General: There is no distension.     Palpations: Abdomen is soft.     Tenderness: There is no abdominal tenderness. There is no right CVA tenderness, left CVA tenderness or guarding.  Musculoskeletal:        General: No swelling, tenderness, deformity or signs of injury.     Right lower leg: No edema.     Left lower leg: No edema.  Skin:    General: Skin is warm and dry.     Capillary Refill: Capillary refill takes less than 2 seconds.     Coloration: Skin is not jaundiced or pale.     Findings: No bruising, erythema or lesion.  Neurological:     Mental Status: She is alert and oriented to person, place, and time.     Motor: No weakness.     Gait: Gait normal.  Psychiatric:        Mood and Affect: Mood normal.         Behavior: Behavior normal.        Thought Content: Thought content normal.        Judgment: Judgment normal.     BP 133/76   Pulse 78   Temp (!) 97.5 F (36.4 C)   Wt 216 lb (98 kg)   LMP 04/12/2024   SpO2 100%   BMI 33.09 kg/m  Wt Readings from Last 3 Encounters:  05/05/24 216 lb (98 kg)  04/15/24 211 lb (95.7 kg)  03/30/24 213 lb 6.4 oz (96.8 kg)    Lab Results  Component Value Date   TSH 0.86 10/22/2011   Lab Results  Component Value Date   WBC 6.6 04/16/2024   HGB 11.7 (L) 04/16/2024   HCT 34.5 (L) 04/16/2024   MCV 87.3 04/16/2024   PLT 213 04/16/2024   Lab Results  Component Value Date   NA 143 04/17/2024   K 3.2 (L) 04/17/2024   CO2 27 04/17/2024   GLUCOSE 86 04/17/2024   BUN 10 04/17/2024   CREATININE 0.71 04/17/2024   BILITOT 0.4 09/28/2023   ALKPHOS 82 09/28/2023   AST 15 09/28/2023   ALT 14 09/28/2023   PROT 7.3 09/28/2023   ALBUMIN 3.7 09/28/2023   CALCIUM  7.8 (L) 04/17/2024   ANIONGAP 7 04/17/2024   EGFR 111 11/22/2023   GFR 128.20 10/22/2011   Lab Results  Component Value Date   CHOL 110 11/22/2023   Lab Results  Component Value Date   HDL 49 11/22/2023  Lab Results  Component Value Date   LDLCALC 51 11/22/2023   Lab Results  Component Value Date   TRIG 39 11/22/2023   Lab Results  Component Value Date   CHOLHDL 2.2 11/22/2023   Lab Results  Component Value Date   HGBA1C 5.1 10/14/2023      Assessment & Plan:   Assessment and Plan Assessment & Plan           Problem List Items Addressed This Visit       Cardiovascular and Mediastinum   High blood pressure - Primary   BP Readings from Last 3 Encounters:  05/05/24 133/76  04/17/24 127/73  03/30/24 133/83   Hypertension well-controlled with home readings below 130/80 mmHg. Recent elevated blood pressure attributed to irritation. - Continue amlodipine  5 mg oral daily. - Continue lifestyle modifications including low-sodium diet and weight  management. - Encourage moderate to vigorous exercise for 30 minutes, five days a week.      Relevant Orders   Basic Metabolic Panel   Lipid panel   Stenosis of right carotid artery    recent Angiogram notable for fully patent right ICA stent and approximately 50-60% stenosis of the right vertebral artery in the V2 horizontal segment associated with mild fusiform dilatation, suspicious for fibromuscular dysplasia. No intervention was required and patient was admitted overnight for observation.   Carotid artery stenosis currently managed with medical treatment. Recent imaging showed soft plaque and improvement compared to January. - Refer to vascular for further evaluation and management.  States that Dr. Lorean is leaving [place] - Continue aspirin  81 mg daily and Plavix  75 mg daily, atorvastatin  20 mg daily.        Relevant Orders   Ambulatory referral to Vascular Surgery     Other   Hypokalemia   Lab Results  Component Value Date   NA 143 04/17/2024   K 3.2 (L) 04/17/2024   CO2 27 04/17/2024   GLUCOSE 86 04/17/2024   BUN 10 04/17/2024   CREATININE 0.71 04/17/2024   CALCIUM  7.8 (L) 04/17/2024   GFR 128.20 10/22/2011   EGFR 111 11/22/2023   GFRNONAA >60 04/17/2024    Hypokalemia with recent low potassium levels post-hospitalization. - Order repeat labs to check potassium levels. - Encourage continued consumption of potassium-rich foods.      Headache   Headache has since resolved Will refer to neurology if she continues to have headaches      Dyslipidemia   Continue Crestor 20 mg daily Rechecking lipid panel Lab Results  Component Value Date   CHOL 110 11/22/2023   HDL 49 11/22/2023   LDLCALC 51 11/22/2023   LDLDIRECT 135 (H) 10/14/2023   TRIG 39 11/22/2023   CHOLHDL 2.2 11/22/2023         Hypocalcemia   Lab Results  Component Value Date   NA 143 04/17/2024   K 3.2 (L) 04/17/2024   CO2 27 04/17/2024   GLUCOSE 86 04/17/2024   BUN 10 04/17/2024    CREATININE 0.71 04/17/2024   CALCIUM  7.8 (L) 04/17/2024   GFR 128.20 10/22/2011   EGFR 111 11/22/2023   GFRNONAA >60 04/17/2024   Hypocalcemia noted with recent drop in calcium  levels post-hospitalization. - Continue calcium  supplementation. - Order repeat labs to check calcium  levels.      Obesity (BMI 30-39.9)   Wt Readings from Last 3 Encounters:  05/05/24 216 lb (98 kg)  04/15/24 211 lb (95.7 kg)  03/30/24 213 lb 6.4 oz (96.8 kg)  Body mass index is 33.09 kg/m.   Obesity with no recent weight loss. Recent surgery impacted physical activity. - Encourage moderate to vigorous exercise for 30 minutes, five days a week. - Advise dietary modifications focusing on more protein and carbohydrates, reducing processed foods.       No orders of the defined types were placed in this encounter.   Follow-up: Return in about 4 months (around 09/04/2024) for FASTING LABS THIS WEEK.    Shakiera Edelson R Kalani Baray, FNP

## 2024-05-11 ENCOUNTER — Other Ambulatory Visit: Payer: Self-pay

## 2024-05-11 DIAGNOSIS — I10 Essential (primary) hypertension: Secondary | ICD-10-CM

## 2024-05-12 ENCOUNTER — Other Ambulatory Visit: Payer: Self-pay | Admitting: Nurse Practitioner

## 2024-05-12 ENCOUNTER — Ambulatory Visit: Payer: Self-pay | Admitting: Nurse Practitioner

## 2024-05-12 DIAGNOSIS — E785 Hyperlipidemia, unspecified: Secondary | ICD-10-CM

## 2024-05-12 DIAGNOSIS — I6521 Occlusion and stenosis of right carotid artery: Secondary | ICD-10-CM

## 2024-05-12 LAB — BASIC METABOLIC PANEL WITH GFR
BUN/Creatinine Ratio: 19 (ref 9–23)
BUN: 14 mg/dL (ref 6–24)
CO2: 21 mmol/L (ref 20–29)
Calcium: 8.9 mg/dL (ref 8.7–10.2)
Chloride: 106 mmol/L (ref 96–106)
Creatinine, Ser: 0.72 mg/dL (ref 0.57–1.00)
Glucose: 84 mg/dL (ref 70–99)
Potassium: 3.6 mmol/L (ref 3.5–5.2)
Sodium: 142 mmol/L (ref 134–144)
eGFR: 106 mL/min/1.73 (ref >59)

## 2024-05-12 LAB — LIPID PANEL
Chol/HDL Ratio: 2.3 ratio (ref 0.0–4.4)
Cholesterol, Total: 117 mg/dL (ref 100–199)
HDL: 50 mg/dL (ref >39)
LDL Chol Calc (NIH): 57 mg/dL (ref 0–99)
Triglycerides: 36 mg/dL (ref 0–149)
VLDL Cholesterol Cal: 10 mg/dL (ref 5–40)

## 2024-05-12 MED ORDER — ATORVASTATIN CALCIUM 40 MG PO TABS: 40.0000 mg | ORAL_TABLET | Freq: Every day | ORAL | 1 refills | Status: DC

## 2024-05-27 ENCOUNTER — Other Ambulatory Visit

## 2024-05-27 ENCOUNTER — Inpatient Hospital Stay

## 2024-05-27 DIAGNOSIS — N946 Dysmenorrhea, unspecified: Secondary | ICD-10-CM | POA: Diagnosis not present

## 2024-05-27 DIAGNOSIS — D500 Iron deficiency anemia secondary to blood loss (chronic): Secondary | ICD-10-CM | POA: Diagnosis present

## 2024-05-27 DIAGNOSIS — D508 Other iron deficiency anemias: Secondary | ICD-10-CM

## 2024-05-27 LAB — CBC WITH DIFFERENTIAL (CANCER CENTER ONLY)
Abs Immature Granulocytes: 0.02 K/uL (ref 0.00–0.07)
Basophils Absolute: 0 K/uL (ref 0.0–0.1)
Basophils Relative: 0 %
Eosinophils Absolute: 0.2 K/uL (ref 0.0–0.5)
Eosinophils Relative: 3 %
HCT: 38.4 % (ref 36.0–46.0)
Hemoglobin: 13.6 g/dL (ref 12.0–15.0)
Immature Granulocytes: 0 %
Lymphocytes Relative: 22 %
Lymphs Abs: 1.3 K/uL (ref 0.7–4.0)
MCH: 30.7 pg (ref 26.0–34.0)
MCHC: 35.4 g/dL (ref 30.0–36.0)
MCV: 86.7 fL (ref 80.0–100.0)
Monocytes Absolute: 0.4 K/uL (ref 0.1–1.0)
Monocytes Relative: 6 %
Neutro Abs: 4.3 K/uL (ref 1.7–7.7)
Neutrophils Relative %: 69 %
Platelet Count: 227 K/uL (ref 150–400)
RBC: 4.43 MIL/uL (ref 3.87–5.11)
RDW: 13.4 % (ref 11.5–15.5)
WBC Count: 6.1 K/uL (ref 4.0–10.5)
nRBC: 0 % (ref 0.0–0.2)

## 2024-05-27 LAB — IRON AND IRON BINDING CAPACITY (CC-WL,HP ONLY)
Iron: 79 ug/dL (ref 28–170)
Saturation Ratios: 26 % (ref 10.4–31.8)
TIBC: 300 ug/dL (ref 250–450)
UIBC: 221 ug/dL (ref 148–442)

## 2024-05-27 LAB — FERRITIN: Ferritin: 129 ng/mL (ref 11–307)

## 2024-05-28 ENCOUNTER — Ambulatory Visit: Payer: Self-pay

## 2024-05-28 ENCOUNTER — Ambulatory Visit

## 2024-05-28 ENCOUNTER — Inpatient Hospital Stay (HOSPITAL_BASED_OUTPATIENT_CLINIC_OR_DEPARTMENT_OTHER)

## 2024-05-28 VITALS — BP 130/81 | HR 72 | Temp 97.7°F | Resp 20 | Wt 220.2 lb

## 2024-05-28 DIAGNOSIS — D500 Iron deficiency anemia secondary to blood loss (chronic): Secondary | ICD-10-CM

## 2024-05-28 DIAGNOSIS — N946 Dysmenorrhea, unspecified: Secondary | ICD-10-CM | POA: Diagnosis not present

## 2024-05-28 NOTE — Progress Notes (Signed)
 [facility] OFFICE PROGRESS NOTE  Patient Care Team: Paseda, Folashade R, FNP as PCP - General (Nurse Practitioner) Rutherford Gain, MD as Consulting Physician (Obstetrics and Gynecology)  Diane [name] is a 43 y.o. female with history of heavy menstrual cycles, and currently on dual antiplatelet for stent being seen for iron  deficiency anemia.   She had tolerated IV iron  in the past and reports unable to tolerate oral iron  with constipation.  She received Feraheme  on 6/27 and 03/30/2024.  Ferritin went from 14 to 129.  Hemoglobin is now normal.  Given her profound iron  deficiency and persistent heavy menstrual bleeding, she would like to check her hemoglobin and labs as able. Assessment & Plan Iron  deficiency anemia due to chronic blood loss Follow-up with ferritin and iron  panel labs Follow-up with me in about 3 months Call earlier if worsening symptoms or bleeding. Dysmenorrhea Patient is scheduled for repeat iron , ferritin and CBC next month  Orders Placed This Encounter  Procedures   CBC with Differential (Cancer Center Only)    Standing Status:   Future    Expiration Date:   05/28/2025   Ferritin    Standing Status:   Future    Expiration Date:   05/28/2025   Iron  and Iron  Binding Capacity ([facility] only)    Standing Status:   Future    Expiration Date:   05/28/2025   CBC with Differential (Cancer Center Only)    Standing Status:   Future    Expiration Date:   05/28/2025   Ferritin    Standing Status:   Future    Expiration Date:   05/28/2025   Iron  and Iron  Binding Capacity ([facility] only)    Standing Status:   Future    Expiration Date:   05/28/2025   CBC with Differential (Cancer Center Only)    Standing Status:   Future    Expiration Date:   05/28/2025   Ferritin    Standing Status:   Future    Expiration Date:   05/28/2025   Iron  and Iron  Binding Capacity ([facility] only)    Standing Status:   Future    Expiration Date:   05/28/2025     Pauletta JAYSON Chihuahua,  MD  INTERVAL HISTORY: Patient returns for follow-up.  She continued to have heavy menstrual cycles.  She is on Plavix  and aspirin  for her stent.  She is worried about recurrent strokes.  No other bleeding.   Anemia history Previously she was on Plavix  due to stents.  Reported bleeding about 5 days a month and heavy for 3 to 4 days.  Previously having heavy menstrual cycle and ablation improved the bleeding.  Charlcie had not noticed any recent bleeding such as melena, hematuria or hematochezia Her last colonoscopy was 2011. She has Crohn's and reports in remission without any medication.  PHYSICAL EXAMINATION:  Vitals:   05/28/24 1355  BP: 130/81  Pulse: 72  Resp: 20  Temp: 97.7 F (36.5 C)  SpO2: 100%   Filed Weights   05/28/24 1355  Weight: 220 lb 3.2 oz (99.9 kg)    GENERAL: alert, no distress and comfortable SKIN: skin color normal EYES: normal, conjunctiva normal   Relevant data reviewed during this visit included labs.

## 2024-06-02 NOTE — Assessment & Plan Note (Addendum)
 Patient is scheduled for repeat iron , ferritin and CBC next month

## 2024-06-02 NOTE — Assessment & Plan Note (Signed)
 Follow-up with ferritin and iron  panel labs Follow-up with me in about 3 months Call earlier if worsening symptoms or bleeding.

## 2024-06-26 ENCOUNTER — Inpatient Hospital Stay

## 2024-06-26 DIAGNOSIS — D500 Iron deficiency anemia secondary to blood loss (chronic): Secondary | ICD-10-CM

## 2024-06-26 DIAGNOSIS — D509 Iron deficiency anemia, unspecified: Secondary | ICD-10-CM | POA: Insufficient documentation

## 2024-06-26 LAB — CBC WITH DIFFERENTIAL (CANCER CENTER ONLY)
Abs Immature Granulocytes: 0.02 K/uL (ref 0.00–0.07)
Basophils Absolute: 0 K/uL (ref 0.0–0.1)
Basophils Relative: 0 %
Eosinophils Absolute: 0.2 K/uL (ref 0.0–0.5)
Eosinophils Relative: 3 %
HCT: 36.9 % (ref 36.0–46.0)
Hemoglobin: 13.2 g/dL (ref 12.0–15.0)
Immature Granulocytes: 0 %
Lymphocytes Relative: 25 %
Lymphs Abs: 1.8 K/uL (ref 0.7–4.0)
MCH: 31.1 pg (ref 26.0–34.0)
MCHC: 35.8 g/dL (ref 30.0–36.0)
MCV: 87 fL (ref 80.0–100.0)
Monocytes Absolute: 0.4 K/uL (ref 0.1–1.0)
Monocytes Relative: 5 %
Neutro Abs: 4.6 K/uL (ref 1.7–7.7)
Neutrophils Relative %: 67 %
Platelet Count: 237 K/uL (ref 150–400)
RBC: 4.24 MIL/uL (ref 3.87–5.11)
RDW: 13.1 % (ref 11.5–15.5)
WBC Count: 7 K/uL (ref 4.0–10.5)
nRBC: 0 % (ref 0.0–0.2)

## 2024-06-26 LAB — FERRITIN: Ferritin: 110 ng/mL (ref 11–307)

## 2024-06-29 ENCOUNTER — Ambulatory Visit: Payer: Self-pay

## 2024-06-29 LAB — IRON AND IRON BINDING CAPACITY (CC-WL,HP ONLY)
Iron: 72 ug/dL (ref 28–170)
Saturation Ratios: 22 % (ref 10.4–31.8)
TIBC: 335 ug/dL (ref 250–450)
UIBC: 263 ug/dL (ref 148–442)

## 2024-06-29 NOTE — Telephone Encounter (Signed)
 Notified of message below

## 2024-06-29 NOTE — Telephone Encounter (Signed)
-----   Message from Pauletta JAYSON Chihuahua sent at 06/29/2024 12:42 PM EDT ----- Hi [place] Mosco. Please let her know hemoglobin and Iron  parameters overall are stable. Thanks.  ----- Message ----- From: Rebecka, Lab In [place] Sent: 06/26/2024   4:24 PM EDT To: Pauletta JAYSON Chihuahua, MD

## 2024-07-09 ENCOUNTER — Other Ambulatory Visit: Payer: Self-pay | Admitting: Nurse Practitioner

## 2024-07-09 DIAGNOSIS — I10 Essential (primary) hypertension: Secondary | ICD-10-CM

## 2024-07-31 ENCOUNTER — Inpatient Hospital Stay

## 2024-07-31 DIAGNOSIS — D509 Iron deficiency anemia, unspecified: Secondary | ICD-10-CM | POA: Insufficient documentation

## 2024-08-05 ENCOUNTER — Telehealth: Payer: Self-pay

## 2024-08-05 NOTE — Telephone Encounter (Signed)
 Patient called to discuss her labs. Arland Legions BSN RN

## 2024-08-06 ENCOUNTER — Telehealth: Payer: Self-pay

## 2024-08-06 NOTE — Telephone Encounter (Signed)
 Scheduled patient for a lab appointment for tomorrow. Called and left a voicemail with the time

## 2024-08-07 ENCOUNTER — Inpatient Hospital Stay

## 2024-08-07 DIAGNOSIS — D509 Iron deficiency anemia, unspecified: Secondary | ICD-10-CM | POA: Diagnosis present

## 2024-08-07 DIAGNOSIS — D500 Iron deficiency anemia secondary to blood loss (chronic): Secondary | ICD-10-CM

## 2024-08-07 LAB — CBC WITH DIFFERENTIAL (CANCER CENTER ONLY)
Abs Immature Granulocytes: 0.01 K/uL (ref 0.00–0.07)
Basophils Absolute: 0 K/uL (ref 0.0–0.1)
Basophils Relative: 0 %
Eosinophils Absolute: 0.1 K/uL (ref 0.0–0.5)
Eosinophils Relative: 1 %
HCT: 37.7 % (ref 36.0–46.0)
Hemoglobin: 13.4 g/dL (ref 12.0–15.0)
Immature Granulocytes: 0 %
Lymphocytes Relative: 25 %
Lymphs Abs: 1.9 K/uL (ref 0.7–4.0)
MCH: 31.2 pg (ref 26.0–34.0)
MCHC: 35.5 g/dL (ref 30.0–36.0)
MCV: 87.7 fL (ref 80.0–100.0)
Monocytes Absolute: 0.3 K/uL (ref 0.1–1.0)
Monocytes Relative: 4 %
Neutro Abs: 5.2 K/uL (ref 1.7–7.7)
Neutrophils Relative %: 70 %
Platelet Count: 252 K/uL (ref 150–400)
RBC: 4.3 MIL/uL (ref 3.87–5.11)
RDW: 12.2 % (ref 11.5–15.5)
WBC Count: 7.5 K/uL (ref 4.0–10.5)
nRBC: 0 % (ref 0.0–0.2)

## 2024-08-07 LAB — IRON AND IRON BINDING CAPACITY (CC-WL,HP ONLY)
Iron: 69 ug/dL (ref 28–170)
Saturation Ratios: 20 % (ref 10.4–31.8)
TIBC: 344 ug/dL (ref 250–450)
UIBC: 276 ug/dL

## 2024-08-07 LAB — FERRITIN: Ferritin: 122 ng/mL (ref 11–307)

## 2024-08-10 NOTE — Telephone Encounter (Signed)
-----   Message from Pauletta JAYSON Chihuahua sent at 08/10/2024  2:25 PM EST ----- Please let her know good news, no iron  deficiency anemia.  Thank you.  If she would like to postpone follow-up for 6 -8 weeks, that is fine. ----- Message ----- From: Rebecka, Lab In [place] Sent: 08/07/2024   4:24 PM EST To: Pauletta JAYSON Chihuahua, MD

## 2024-08-10 NOTE — Telephone Encounter (Signed)
 LM with note below. Requested return call to discuss appt preference

## 2024-08-17 ENCOUNTER — Telehealth: Payer: Self-pay

## 2024-08-17 NOTE — Telephone Encounter (Signed)
 Rescheduled patient's appt on 12/5 to 12/9. Called and spoke with the patient, she is aware.

## 2024-08-24 ENCOUNTER — Other Ambulatory Visit: Payer: Self-pay | Admitting: Nurse Practitioner

## 2024-08-24 DIAGNOSIS — J309 Allergic rhinitis, unspecified: Secondary | ICD-10-CM

## 2024-08-27 ENCOUNTER — Inpatient Hospital Stay

## 2024-08-28 ENCOUNTER — Inpatient Hospital Stay

## 2024-08-28 DIAGNOSIS — D509 Iron deficiency anemia, unspecified: Secondary | ICD-10-CM | POA: Diagnosis present

## 2024-08-28 DIAGNOSIS — D500 Iron deficiency anemia secondary to blood loss (chronic): Secondary | ICD-10-CM

## 2024-08-28 LAB — CBC WITH DIFFERENTIAL (CANCER CENTER ONLY)
Abs Immature Granulocytes: 0.04 K/uL (ref 0.00–0.07)
Basophils Absolute: 0 K/uL (ref 0.0–0.1)
Basophils Relative: 0 %
Eosinophils Absolute: 0.2 K/uL (ref 0.0–0.5)
Eosinophils Relative: 3 %
HCT: 38.9 % (ref 36.0–46.0)
Hemoglobin: 13.5 g/dL (ref 12.0–15.0)
Immature Granulocytes: 1 %
Lymphocytes Relative: 23 %
Lymphs Abs: 1.8 K/uL (ref 0.7–4.0)
MCH: 30.8 pg (ref 26.0–34.0)
MCHC: 34.7 g/dL (ref 30.0–36.0)
MCV: 88.6 fL (ref 80.0–100.0)
Monocytes Absolute: 0.4 K/uL (ref 0.1–1.0)
Monocytes Relative: 6 %
Neutro Abs: 5.4 K/uL (ref 1.7–7.7)
Neutrophils Relative %: 67 %
Platelet Count: 256 K/uL (ref 150–400)
RBC: 4.39 MIL/uL (ref 3.87–5.11)
RDW: 12.2 % (ref 11.5–15.5)
WBC Count: 7.9 K/uL (ref 4.0–10.5)
nRBC: 0 % (ref 0.0–0.2)

## 2024-08-28 LAB — IRON AND IRON BINDING CAPACITY (CC-WL,HP ONLY)
Iron: 58 ug/dL (ref 28–170)
Saturation Ratios: 17 % (ref 10.4–31.8)
TIBC: 336 ug/dL (ref 250–450)
UIBC: 278 ug/dL

## 2024-08-28 LAB — FERRITIN: Ferritin: 111 ng/mL (ref 11–307)

## 2024-08-31 NOTE — Progress Notes (Unsigned)
 [facility] OFFICE PROGRESS NOTE  Patient Care Team: Paseda, Folashade R, FNP as PCP - General (Nurse Practitioner) Rutherford Gain, MD as Consulting Physician (Obstetrics and Gynecology)  Monnie is a 43 y.o. female with history of heavy menstrual cycles, endometriosis, h/o myomectomy and currently on dual antiplatelet for stent being seen for iron  deficiency anemia.    She had tolerated IV iron  in the past and reports unable to tolerate oral iron  with constipation.  She received Feraheme  on 6/27 and 03/30/2024.  Ferritin went from 14 to 129.  Hemoglobin is now normal. Seeing Ob once a year.  Ferritin level and hemoglobin are stable.  Repeat in February, earlier if needed.  Assessment & Plan Iron  deficiency anemia due to chronic blood loss Repeat lab in about after 2 cycles in Feb Schedule lab on 2/27 and see me on 3/2  Orders Placed This Encounter  Procedures   CBC with Differential (Cancer Center Only)    Standing Status:   Future    Expiration Date:   09/01/2025   Iron  and Iron  Binding Capacity ([facility] only)    Standing Status:   Future    Expiration Date:   09/01/2025   Ferritin    Standing Status:   Future    Expiration Date:   09/01/2025     Pauletta JAYSON Chihuahua, MD  INTERVAL HISTORY: Patient returns for follow-up.  Report energy is down when after a cycle of bleeding. They are still heavy. She goes through a box of pads a months with multiple changes a day. No other bleeding. On dual antiplatelet.  She has ice craving.   Past Surgical History:  Procedure Laterality Date   ADENOIDECTOMY  2014   CESAREAN SECTION N/A 04/14/2018   Procedure: PRIMARY CESAREAN SECTION;  Surgeon: Edsel Norleen GAILS, MD;  Location: [facility] BIRTHING SUITES;  Service: Obstetrics;  Laterality: N/A;   DILATATION & CURRETTAGE/HYSTEROSCOPY WITH RESECTOCOPE N/A 06/09/2013   Procedure: DILATATION & CURETTAGE/HYSTEROSCOPY WITH HYSTEROSCOPIC RESECTION OF SUBMUCOSAL FIBROID AND ENDOMETRIAL POLYP;   Surgeon: Gain DELENA Rutherford, MD;  Location: [facility];  Service: Gynecology;  Laterality: N/A;   IR ANGIO INTRA EXTRACRAN SEL COM CAROTID INNOMINATE BILAT MOD SED  09/30/2023   IR ANGIO INTRA EXTRACRAN SEL COM CAROTID INNOMINATE BILAT MOD SED  04/15/2024   IR ANGIO INTRA EXTRACRAN SEL INTERNAL CAROTID UNI R MOD SED  10/16/2023   IR ANGIO VERTEBRAL SEL SUBCLAVIAN INNOMINATE UNI L MOD SED  09/30/2023   IR ANGIO VERTEBRAL SEL VERTEBRAL BILAT MOD SED  04/15/2024   IR ANGIO VERTEBRAL SEL VERTEBRAL UNI R MOD SED  09/30/2023   IR CT HEAD LTD  10/16/2023   IR INTRA CRAN STENT  10/16/2023   IR RADIOLOGIST EVAL & MGMT  09/30/2023   IR RADIOLOGIST EVAL & MGMT  10/03/2023   IR RADIOLOGIST EVAL & MGMT  10/31/2023   IR RADIOLOGIST EVAL & MGMT  04/13/2024   IR RADIOLOGIST EVAL & MGMT  04/24/2024   IR US  GUIDE VASC ACCESS RIGHT  09/30/2023   IR US  GUIDE VASC ACCESS RIGHT  04/15/2024   LAPAROSCOPY N/A 06/09/2013   Procedure: LAPAROSCOPY DIAGNOSTIC;  Surgeon: Gain DELENA Rutherford, MD;  Location: [facility];  Service: Gynecology;  Laterality: N/A;   RADIOLOGY WITH ANESTHESIA N/A 10/16/2023   Procedure: endovascular treatment of the right internal carotid artery tandem intracranial stenosis;  Surgeon: Dolphus Carrion, MD;  Location: [facility] OR;  Service: Radiology;  Laterality: N/A;   RADIOLOGY WITH ANESTHESIA N/A 04/15/2024   Procedure: RADIOLOGY  WITH ANESTHESIA;  Surgeon: Dolphus Carrion, MD;  Location: [facility] OR;  Service: Radiology;  Laterality: N/A;  Diagnostic angiogram w/intent to treat   ROBOT ASSISTED MYOMECTOMY N/A 02/09/2015   Procedure: MYOMECTOMY,EXCISION OF ENDOMETRIOSIS,PRE-SACRAL NEURECTOMY;  Surgeon: Cynthia Loss, MD;  Location: [facility];  Service: Gynecology;  Laterality: N/A;   ROBOTIC ASSISTED LAPAROSCOPIC LYSIS OF ADHESION N/A 06/09/2013   Procedure: ROBOTIC ASSISTED LAPAROSCOPIC LYSIS OF ADHESION; Resection of Endometriosis and excision of fibroid;  Surgeon: Dickie DELENA Carder, MD;  Location: [facility];   Service: Gynecology;  Laterality: N/A;   TONSILLECTOMY  2014   TYMPANOSTOMY TUBE PLACEMENT  2014     Vitals:   09/01/24 1201  BP: 134/86  Pulse: 70  Resp: 18  Temp: (!) 97 F (36.1 C)  SpO2: 100%   Filed Weights   09/01/24 1201  Weight: 224 lb (101.6 kg)   No acute distress  Relevant data reviewed during this visit included labs.  New labs ordered.

## 2024-09-01 ENCOUNTER — Inpatient Hospital Stay

## 2024-09-01 VITALS — BP 134/86 | HR 70 | Temp 97.0°F | Resp 18 | Wt 224.0 lb

## 2024-09-01 DIAGNOSIS — D500 Iron deficiency anemia secondary to blood loss (chronic): Secondary | ICD-10-CM

## 2024-09-01 DIAGNOSIS — D509 Iron deficiency anemia, unspecified: Secondary | ICD-10-CM | POA: Diagnosis not present

## 2024-09-01 NOTE — Assessment & Plan Note (Signed)
 Repeat lab in about after 2 cycles in Feb Schedule lab on 2/27 and see me on 3/2

## 2024-09-04 ENCOUNTER — Ambulatory Visit: Payer: Self-pay | Admitting: Nurse Practitioner

## 2024-09-14 ENCOUNTER — Other Ambulatory Visit: Payer: Self-pay

## 2024-09-14 DIAGNOSIS — I6529 Occlusion and stenosis of unspecified carotid artery: Secondary | ICD-10-CM

## 2024-09-15 ENCOUNTER — Ambulatory Visit (INDEPENDENT_AMBULATORY_CARE_PROVIDER_SITE_OTHER): Payer: Self-pay | Admitting: Internal Medicine

## 2024-09-15 ENCOUNTER — Other Ambulatory Visit: Payer: Self-pay

## 2024-09-15 ENCOUNTER — Encounter: Payer: Self-pay | Admitting: Internal Medicine

## 2024-09-15 VITALS — BP 136/82 | HR 78 | Temp 98.0°F | Ht 67.0 in | Wt 225.0 lb

## 2024-09-15 DIAGNOSIS — T7819XA Other adverse food reactions, not elsewhere classified, initial encounter: Secondary | ICD-10-CM

## 2024-09-15 DIAGNOSIS — T50Z95A Adverse effect of other vaccines and biological substances, initial encounter: Secondary | ICD-10-CM

## 2024-09-15 DIAGNOSIS — Z888 Allergy status to other drugs, medicaments and biological substances status: Secondary | ICD-10-CM

## 2024-09-15 DIAGNOSIS — J3089 Other allergic rhinitis: Secondary | ICD-10-CM | POA: Diagnosis not present

## 2024-09-15 DIAGNOSIS — Z887 Allergy status to serum and vaccine status: Secondary | ICD-10-CM

## 2024-09-15 MED ORDER — EPINEPHRINE 0.3 MG/0.3ML IJ SOAJ: 0.3000 mg | INTRAMUSCULAR | 1 refills | Status: AC | PRN

## 2024-09-15 NOTE — Progress Notes (Signed)
 "  NEW PATIENT  Date of Service/Encounter:  09/15/2024  Consult requested by: Paseda, Folashade R, FNP   Subjective:   Diane [name] (DOB: Aug 16, 1981) is a 43 y.o. female who presents to the clinic on 09/15/2024 with a chief complaint of Food Allergy, Seasonal Allergy, and Establish Care .    History obtained from: chart review and patient.   Rhinitis:  Started since childhood- middle school.   Symptoms include: nasal congestion, rhinorrhea, post nasal drainage, sneezing, watery eyes, and itchy eyes  Occurs year-round with seasonal flares in Fall  Potential triggers: pollens, cats   Treatments tried:  Qnasl  PRN  Zyrtec  daily   Previous allergy testing: yes; 2010 History of sinus surgery: T&A, ear tubes  Nonallergic triggers: none   Concern for Food Allergy:  Foods of concern: almonds, apples, cherry; occurs with fresh foods   History of reaction: itchy, scratchy throat  Previous allergy testing no   Drug Allergy:  Used chlorhexidine , cleaned with it and 2 minutes after had hives all over.  IV benadryl  helped it.    Flu Reactions: Notes large swelling/redness after flu shot with residual scarring.  Denies any other symptoms.     Reviewed:  09/01/2024: followed by Onc for IDA, prevoiusly on IV iron .   12/23/2023: seen by urgent care for congestion, runny nose, sneezing. Discussed allergic rhinitis.  Rx Qnasl .  Past Medical History: Past Medical History:  Diagnosis Date   ADD (attention deficit disorder)    Anemia    Anxiety    Crohn's disease (HCC)    D-dimer, elevated    Fibroid    Headache(784.0)    otc meds - last migraine 2009   Heart murmur    as child, never had any problems   History of endometriosis    Hypertension    Leiomyoma 02/09/2015   Low blood potassium    history   Migraine without aura, with intractable migraine, so stated, without mention of status migrainosus 06/19/2013   Pneumonia    x5   PONV (postoperative nausea and vomiting)     Seasonal allergies    Vaginal Pap smear, abnormal    Vasovagal syncope    Past Surgical History: Past Surgical History:  Procedure Laterality Date   ADENOIDECTOMY  2014   CESAREAN SECTION N/A 04/14/2018   Procedure: PRIMARY CESAREAN SECTION;  Surgeon: Edsel Norleen GAILS, MD;  Location: [facility] BIRTHING SUITES;  Service: Obstetrics;  Laterality: N/A;   DILATATION & CURRETTAGE/HYSTEROSCOPY WITH RESECTOCOPE N/A 06/09/2013   Procedure: DILATATION & CURETTAGE/HYSTEROSCOPY WITH HYSTEROSCOPIC RESECTION OF SUBMUCOSAL FIBROID AND ENDOMETRIAL POLYP;  Surgeon: Dickie DELENA Carder, MD;  Location: [facility];  Service: Gynecology;  Laterality: N/A;   IR ANGIO INTRA EXTRACRAN SEL COM CAROTID INNOMINATE BILAT MOD SED  09/30/2023   IR ANGIO INTRA EXTRACRAN SEL COM CAROTID INNOMINATE BILAT MOD SED  04/15/2024   IR ANGIO INTRA EXTRACRAN SEL INTERNAL CAROTID UNI R MOD SED  10/16/2023   IR ANGIO VERTEBRAL SEL SUBCLAVIAN INNOMINATE UNI L MOD SED  09/30/2023   IR ANGIO VERTEBRAL SEL VERTEBRAL BILAT MOD SED  04/15/2024   IR ANGIO VERTEBRAL SEL VERTEBRAL UNI R MOD SED  09/30/2023   IR CT HEAD LTD  10/16/2023   IR INTRA CRAN STENT  10/16/2023   IR RADIOLOGIST EVAL & MGMT  09/30/2023   IR RADIOLOGIST EVAL & MGMT  10/03/2023   IR RADIOLOGIST EVAL & MGMT  10/31/2023   IR RADIOLOGIST EVAL & MGMT  04/13/2024   IR RADIOLOGIST EVAL &  MGMT  04/24/2024   IR US  GUIDE VASC ACCESS RIGHT  09/30/2023   IR US  GUIDE VASC ACCESS RIGHT  04/15/2024   LAPAROSCOPY N/A 06/09/2013   Procedure: LAPAROSCOPY DIAGNOSTIC;  Surgeon: Dickie DELENA Carder, MD;  Location: [facility];  Service: Gynecology;  Laterality: N/A;   RADIOLOGY WITH ANESTHESIA N/A 10/16/2023   Procedure: endovascular treatment of the right internal carotid artery tandem intracranial stenosis;  Surgeon: Dolphus Carrion, MD;  Location: [facility] OR;  Service: Radiology;  Laterality: N/A;   RADIOLOGY WITH ANESTHESIA N/A 04/15/2024   Procedure: RADIOLOGY WITH ANESTHESIA;  Surgeon: Dolphus Carrion,  MD;  Location: [facility] OR;  Service: Radiology;  Laterality: N/A;  Diagnostic angiogram w/intent to treat   ROBOT ASSISTED MYOMECTOMY N/A 02/09/2015   Procedure: MYOMECTOMY,EXCISION OF ENDOMETRIOSIS,PRE-SACRAL NEURECTOMY;  Surgeon: Cynthia Loss, MD;  Location: [facility];  Service: Gynecology;  Laterality: N/A;   ROBOTIC ASSISTED LAPAROSCOPIC LYSIS OF ADHESION N/A 06/09/2013   Procedure: ROBOTIC ASSISTED LAPAROSCOPIC LYSIS OF ADHESION; Resection of Endometriosis and excision of fibroid;  Surgeon: Dickie DELENA Carder, MD;  Location: [facility];  Service: Gynecology;  Laterality: N/A;   TONSILLECTOMY  2014   TYMPANOSTOMY TUBE PLACEMENT  2014    Family History: Family History  Problem Relation Age of Onset   Heart disease Mother 20       CHF   Crohn's disease Mother    Asthma Mother    Asthma Brother    Heart disease Maternal Aunt    Diabetes Maternal Aunt    Stroke Maternal Aunt    Heart disease Maternal Grandfather    Stroke Maternal Grandfather     Social History:  Flooring in bedroom: wood Pets: none Tobacco use/exposure: none Job: interior and spatial designer   Medication List:  Allergies as of 09/15/2024       Reactions   Chlorhexidine  Hives, Itching   Patient also had mild redness and hives in one location.  Recommended to pre medicate with Benadryl  if needed.        Medication List        Accurate as of September 15, 2024  1:18 PM. If you have any questions, ask your nurse or doctor.          acetaminophen  500 MG tablet Commonly known as: TYLENOL  Take 1,000 mg by mouth every 6 (six) hours as needed for mild pain (pain score 1-3) or headache.   ALPRAZolam  0.5 MG tablet Commonly known as: XANAX  Take 0.5 mg by mouth at bedtime as needed for anxiety.   amLODipine  5 MG tablet Commonly known as: NORVASC  TAKE 1 TABLET (5 MG TOTAL) BY MOUTH DAILY.   aspirin  81 MG chewable tablet Chew 1 tablet (81 mg total) by mouth daily.   atorvastatin  40 MG tablet Commonly known as:  LIPITOR Take 1 tablet (40 mg total) by mouth daily.   cetirizine  10 MG tablet Commonly known as: ZYRTEC  TAKE 1 TABLET BY MOUTH EVERY DAY   clopidogrel  75 MG tablet Commonly known as: Plavix  Take 1 tablet (75 mg total) by mouth daily.   ELDERBERRY PO Take 2 tablets by mouth daily.   OVER THE COUNTER MEDICATION Take 1 tablet by mouth. Calcium  supplement daily--unknown dose   polyethylene glycol 17 g packet Commonly known as: MIRALAX  / GLYCOLAX  Take 17 g by mouth daily. What changed:  when to take this reasons to take this   Qnasl  80 MCG/ACT Aers Generic drug: Beclomethasone Dipropionate  Place 2 sprays into the nose daily. What changed:  when to take this reasons  to take this         REVIEW OF SYSTEMS: Pertinent positives and negatives discussed in HPI.   Objective:   Physical Exam: BP 136/82 (BP Location: Right Arm, Patient Position: Sitting, Cuff Size: Large)   Pulse 78   Temp 98 F (36.7 C) (Temporal)   Ht 5' 7 (1.702 m)   SpO2 100%   BMI 35.08 kg/m  Body mass index is 35.08 kg/m. GEN: alert, well developed HEENT: clear conjunctiva, nose with + mild inferior turbinate hypertrophy, pink nasal mucosa, +clear rhinorrhea, + cobblestoning HEART: regular rate and rhythm, no murmur LUNGS: clear to auscultation bilaterally, no coughing, unlabored respiration ABDOMEN: soft, non distended  SKIN: no rashes or lesions   Assessment:   1. Other allergic rhinitis   2. Pollen-food allergy, initial encounter   3. Allergy to chlorhexidine    4. Adverse effect of vaccine, initial encounter     Plan/Recommendations:  Other Allergic Rhinitis: - Due to turbinate hypertrophy, seasonal flare ups, PFAS and unresponsive to over the counter meds, will perform skin testing to identify aeroallergen triggers.   - Use nasal saline rinses before nose sprays such as with Neilmed Sinus Rinse.  Use distilled water.   - Use Qnasl  2 sprays each nostril daily. Aim upward and  outward. - Use Zyrtec  10 mg daily.   Oral Allergy Syndrome: - These symptoms are typically not life-threatening and are because of a cross reaction between a pollen you are allergic to, and to a protein in specific foods (such as fresh fruits, vegetables, and nuts). - If you can eat these things and tolerate the symptoms, it is fine to continue to do so.  If not, you may avoid these fresh fruits and vegetables.   - Heating these foods, buying them canned, and peeling these foods should allow them to be consumed without symptoms or with less symptoms. - Patients typically report itching and/or mild swelling of the mouth and throat immediately following ingestion of certain uncooked fruits (including nuts) or raw vegetables.  - Only a very small number of affected individuals experience systemic allergic reactions, such as anaphylaxis which occurs with true food allergies.   - for SKIN only reaction (itching), okay to take Zyrtec  10 mg every 12 hours as needed - for SKIN + ANY additional symptoms, OR IF concern for LIFE THREATENING reaction = Epipen  Autoinjector EpiPen  0.3 mg. - If using Epinephrine  autoinjector, call 911 or go to the ER.   Chlorhexidine  Reaction - Use alcoholic povidone-iodine solution instead.  Local Reaction to Flu Vaccine - Likely arthus type reaction.  Can use Zyrtec  10mg  twice daily for 1-2 weeks and ice the area after.     Hold all anti-histamines (Xyzal, Allegra , Zyrtec , Claritin , Benadryl , Pepcid ) 3 days prior to next visit.  Follow up: skin testing 12/30 at 830 1-55, apples, cherry, almonds, no ID  Arleta Blanch, MD [facility]        "

## 2024-09-15 NOTE — Patient Instructions (Addendum)
 Other Allergic Rhinitis: - Use nasal saline rinses before nose sprays such as with Neilmed Sinus Rinse.  Use distilled water.   - Use Qnasl  2 sprays each nostril daily. Aim upward and outward. - Use Zyrtec  10 mg daily.   Oral Allergy Syndrome: - These symptoms are typically not life-threatening and are because of a cross reaction between a pollen you are allergic to, and to a protein in specific foods (such as fresh fruits, vegetables, and nuts). - If you can eat these things and tolerate the symptoms, it is fine to continue to do so.  If not, you may avoid these fresh fruits and vegetables.   - Heating these foods, buying them canned, and peeling these foods should allow them to be consumed without symptoms or with less symptoms. - Patients typically report itching and/or mild swelling of the mouth and throat immediately following ingestion of certain uncooked fruits (including nuts) or raw vegetables.  - Only a very small number of affected individuals experience systemic allergic reactions, such as anaphylaxis which occurs with true food allergies.   - for SKIN only reaction (itching), okay to take Zyrtec  10 mg every 12 hours as needed - for SKIN + ANY additional symptoms, OR IF concern for LIFE THREATENING reaction = Epipen  Autoinjector EpiPen  0.3 mg. - If using Epinephrine  autoinjector, call 911 or go to the ER.   Chlorhexidine  Reaction - Use alcohol based povidone-iodine solution instead.  Local Reaction to Flu Vaccine - Likely arthus type reaction.  Can use Zyrtec  10mg  twice daily for 1-2 weeks and ice the area after.     Hold all anti-histamines (Xyzal, Allegra , Zyrtec , Claritin , Benadryl , Pepcid ) 3 days prior to next visit.  Follow up: skin testing 12/30 at 830 1-55, apples, cherry, almonds

## 2024-09-21 ENCOUNTER — Ambulatory Visit (INDEPENDENT_AMBULATORY_CARE_PROVIDER_SITE_OTHER): Payer: Self-pay | Admitting: Nurse Practitioner

## 2024-09-21 ENCOUNTER — Encounter: Payer: Self-pay | Admitting: Nurse Practitioner

## 2024-09-21 VITALS — BP 116/77 | HR 77 | Wt 225.0 lb

## 2024-09-21 DIAGNOSIS — K5900 Constipation, unspecified: Secondary | ICD-10-CM

## 2024-09-21 DIAGNOSIS — M79671 Pain in right foot: Secondary | ICD-10-CM | POA: Insufficient documentation

## 2024-09-21 DIAGNOSIS — M79672 Pain in left foot: Secondary | ICD-10-CM

## 2024-09-21 DIAGNOSIS — D500 Iron deficiency anemia secondary to blood loss (chronic): Secondary | ICD-10-CM

## 2024-09-21 DIAGNOSIS — I10 Essential (primary) hypertension: Secondary | ICD-10-CM

## 2024-09-21 DIAGNOSIS — E669 Obesity, unspecified: Secondary | ICD-10-CM

## 2024-09-21 DIAGNOSIS — N920 Excessive and frequent menstruation with regular cycle: Secondary | ICD-10-CM | POA: Diagnosis not present

## 2024-09-21 DIAGNOSIS — E785 Hyperlipidemia, unspecified: Secondary | ICD-10-CM

## 2024-09-21 DIAGNOSIS — I6521 Occlusion and stenosis of right carotid artery: Secondary | ICD-10-CM

## 2024-09-21 MED ORDER — AMLODIPINE BESYLATE 5 MG PO TABS: 5.0000 mg | ORAL_TABLET | Freq: Every day | ORAL | 2 refills | Status: AC

## 2024-09-21 MED ORDER — ATORVASTATIN CALCIUM 40 MG PO TABS: 40.0000 mg | ORAL_TABLET | Freq: Every day | ORAL | 1 refills | Status: AC

## 2024-09-21 NOTE — Assessment & Plan Note (Signed)
 Takes MiraLAX  as needed, magnesium  daily Continue current medication

## 2024-09-21 NOTE — Assessment & Plan Note (Signed)
 Continue with magnesium  supplements Checking labs .

## 2024-09-21 NOTE — Assessment & Plan Note (Signed)
 Symptoms worsened by weight gain. Relief with supportive footwear and exercises. - Continue using supportive footwear. - Continue physical therapy exercises.

## 2024-09-21 NOTE — Progress Notes (Signed)
 "  Established Patient Office Visit  Subjective:  Patient ID: Diane [name], female    DOB: 10-06-1980  Age: 43 y.o. MRN: [id number]  CC:  Chief Complaint  Patient presents with   Hypertension    HPI   Discussed the use of AI scribe software for clinical note transcription with the patient, who gave verbal consent to proceed.  History of Present Illness Diane [name] is a 43 year old female  has a past medical history of ADD (attention deficit disorder), Anemia, Anxiety, Crohn's disease (HCC), D-dimer, elevated, Fibroid, Headache(784.0), Heart murmur, History of endometriosis, Hypertension, Leiomyoma (02/09/2015), Low blood potassium, Migraine without aura, with intractable migraine, so stated, without mention of status migrainosus (06/19/2013), Pneumonia, PONV (postoperative nausea and vomiting), Seasonal allergies, Vaginal Pap smear, abnormal, and Vasovagal syncope.   who presents for blood pressure management and medication review.  She monitors her blood pressure at home, noting occasional elevations attributed to dietary choices. She manages these episodes by increasing water intake and adjusting her diet, which usually normalizes her blood pressure by the next day. Her home readings are generally within target range, with a recent reading of 115/68 mmHg. She takes amlodipine  5 mg daily, usually in the morning, but sometimes forgets and takes it later in the day.  She reports making dietary modifications and increasing physical activity with the goal of regulating her blood pressure without medication by 2026. She aims to regulate her blood pressure without medication by 2026. She notes that her activity levels tend to decrease during colder months.  She has a history of weight gain, which she associates with worsening plantar fasciitis symptoms. Weight loss previously alleviated her foot pain. She wears Crocs for comfort and uses a tennis ball for foot exercises, which have  been beneficial. She expresses a desire to lose weight due to concerns about worsening joint pain.  She takes atorvastatin  40 mg daily, aspirin  81 mg, and Plavix  75 mg daily. She is concerned about Plavix  causing heavy menstrual bleeding, similar to when she had fibroids, which were removed in 2016.  She experiences occasional constipation, managed with magnesium  supplements and Miralax , especially after consuming soda. She primarily drinks water and occasionally lemonade. She has made dietary changes, eliminating cheese and pork, and limits red meat intake.  .     Assessment & Plan       Takes calcium  and magnesium  daily Past Medical History:  Diagnosis Date   ADD (attention deficit disorder)    Anemia    Anxiety    Crohn's disease (HCC)    D-dimer, elevated    Fibroid    Headache(784.0)    otc meds - last migraine 2009   Heart murmur    as child, never had any problems   History of endometriosis    Hypertension    Leiomyoma 02/09/2015   Low blood potassium    history   Migraine without aura, with intractable migraine, so stated, without mention of status migrainosus 06/19/2013   Pneumonia    x5   PONV (postoperative nausea and vomiting)    Seasonal allergies    Vaginal Pap smear, abnormal    Vasovagal syncope     Past Surgical History:  Procedure Laterality Date   ADENOIDECTOMY  2014   CESAREAN SECTION N/A 04/14/2018   Procedure: PRIMARY CESAREAN SECTION;  Surgeon: Edsel Norleen GAILS, MD;  Location: [facility];  Service: Obstetrics;  Laterality: N/A;   DILATATION & CURRETTAGE/HYSTEROSCOPY WITH RESECTOCOPE N/A 06/09/2013  Procedure: DILATATION & CURETTAGE/HYSTEROSCOPY WITH HYSTEROSCOPIC RESECTION OF SUBMUCOSAL FIBROID AND ENDOMETRIAL POLYP;  Surgeon: Dickie DELENA Carder, MD;  Location: [facility];  Service: Gynecology;  Laterality: N/A;   IR ANGIO INTRA EXTRACRAN SEL COM CAROTID INNOMINATE BILAT MOD SED  09/30/2023   IR ANGIO INTRA EXTRACRAN SEL COM CAROTID  INNOMINATE BILAT MOD SED  04/15/2024   IR ANGIO INTRA EXTRACRAN SEL INTERNAL CAROTID UNI R MOD SED  10/16/2023   IR ANGIO VERTEBRAL SEL SUBCLAVIAN INNOMINATE UNI L MOD SED  09/30/2023   IR ANGIO VERTEBRAL SEL VERTEBRAL BILAT MOD SED  04/15/2024   IR ANGIO VERTEBRAL SEL VERTEBRAL UNI R MOD SED  09/30/2023   IR CT HEAD LTD  10/16/2023   IR INTRA CRAN STENT  10/16/2023   IR RADIOLOGIST EVAL & MGMT  09/30/2023   IR RADIOLOGIST EVAL & MGMT  10/03/2023   IR RADIOLOGIST EVAL & MGMT  10/31/2023   IR RADIOLOGIST EVAL & MGMT  04/13/2024   IR RADIOLOGIST EVAL & MGMT  04/24/2024   IR US  GUIDE VASC ACCESS RIGHT  09/30/2023   IR US  GUIDE VASC ACCESS RIGHT  04/15/2024   LAPAROSCOPY N/A 06/09/2013   Procedure: LAPAROSCOPY DIAGNOSTIC;  Surgeon: Dickie DELENA Carder, MD;  Location: [facility];  Service: Gynecology;  Laterality: N/A;   RADIOLOGY WITH ANESTHESIA N/A 10/16/2023   Procedure: endovascular treatment of the right internal carotid artery tandem intracranial stenosis;  Surgeon: Dolphus Carrion, MD;  Location: [facility] OR;  Service: Radiology;  Laterality: N/A;   RADIOLOGY WITH ANESTHESIA N/A 04/15/2024   Procedure: RADIOLOGY WITH ANESTHESIA;  Surgeon: Dolphus Carrion, MD;  Location: [facility] OR;  Service: Radiology;  Laterality: N/A;  Diagnostic angiogram w/intent to treat   ROBOT ASSISTED MYOMECTOMY N/A 02/09/2015   Procedure: MYOMECTOMY,EXCISION OF ENDOMETRIOSIS,PRE-SACRAL NEURECTOMY;  Surgeon: Cynthia Loss, MD;  Location: [facility];  Service: Gynecology;  Laterality: N/A;   ROBOTIC ASSISTED LAPAROSCOPIC LYSIS OF ADHESION N/A 06/09/2013   Procedure: ROBOTIC ASSISTED LAPAROSCOPIC LYSIS OF ADHESION; Resection of Endometriosis and excision of fibroid;  Surgeon: Dickie DELENA Carder, MD;  Location: [facility];  Service: Gynecology;  Laterality: N/A;   TONSILLECTOMY  2014   TYMPANOSTOMY TUBE PLACEMENT  2014    Family History  Problem Relation Age of Onset   Heart disease Mother 31       CHF   Crohn's disease Mother     Asthma Mother    Asthma Brother    Heart disease Maternal Aunt    Diabetes Maternal Aunt    Stroke Maternal Aunt    Heart disease Maternal Grandfather    Stroke Maternal Grandfather     Social History   Socioeconomic History   Marital status: Divorced    Spouse name: Not on file   Number of children: 2   Years of education: some coll.   Highest education level: Not on file  Occupational History   Not on file  Tobacco Use   Smoking status: Never   Smokeless tobacco: Never  Vaping Use   Vaping status: Never Used  Substance and Sexual Activity   Alcohol use: Yes    Comment: occas   Drug use: No   Sexual activity: Yes    Birth control/protection: None  Other Topics Concern   Not on file  Social History Narrative   Patient does not drink caffeine.   Patient is right handed.    Social Drivers of Health   Tobacco Use: Low Risk (09/21/2024)   Patient History    Smoking Tobacco  Use: Never    Smokeless Tobacco Use: Never    Passive Exposure: Not on file  Financial Resource Strain: Not on file  Food Insecurity: No Food Insecurity (09/21/2024)   Epic    Worried About Programme Researcher, Broadcasting/film/video in the Last Year: Never true    Ran Out of Food in the Last Year: Never true  Transportation Needs: No Transportation Needs (09/21/2024)   Epic    Lack of Transportation (Medical): No    Lack of Transportation (Non-Medical): No  Physical Activity: Not on file  Stress: Not on file  Social Connections: Unknown (04/15/2024)   Social Connection and Isolation Panel    Frequency of Communication with Friends and Family: Three times a week    Frequency of Social Gatherings with Friends and Family: Three times a week    Attends Religious Services: More than 4 times per year    Active Member of Clubs or Organizations: No    Attends Banker Meetings: Never    Marital Status: Patient unable to answer  Intimate Partner Violence: Not At Risk (09/21/2024)   Epic    Fear of Current or  Ex-Partner: No    Emotionally Abused: No    Physically Abused: No    Sexually Abused: No  Depression (PHQ2-9): Low Risk (09/21/2024)   Depression (PHQ2-9)    PHQ-2 Score: 0  Alcohol Screen: Not on file  Housing: Low Risk (09/21/2024)   Epic    Unable to Pay for Housing in the Last Year: No    Number of Times Moved in the Last Year: 0    Homeless in the Last Year: No  Utilities: Not At Risk (09/21/2024)   Epic    Threatened with loss of utilities: No  Health Literacy: Not on file    Outpatient Medications Prior to Visit  Medication Sig Dispense Refill   acetaminophen  (TYLENOL ) 500 MG tablet Take 1,000 mg by mouth every 6 (six) hours as needed for mild pain (pain score 1-3) or headache.     ALPRAZolam  (XANAX ) 0.5 MG tablet Take 0.5 mg by mouth at bedtime as needed for anxiety.     aspirin  81 MG chewable tablet Chew 1 tablet (81 mg total) by mouth daily. 30 tablet 0   Beclomethasone Dipropionate  (QNASL ) 80 MCG/ACT AERS Place 2 sprays into the nose daily. (Patient taking differently: Place 2 sprays into the nose daily as needed (for allergies).) 10.6 g 1   cetirizine  (ZYRTEC ) 10 MG tablet TAKE 1 TABLET BY MOUTH EVERY DAY 90 tablet 1   clopidogrel  (PLAVIX ) 75 MG tablet Take 1 tablet (75 mg total) by mouth daily. 90 tablet 4   ELDERBERRY PO Take 2 tablets by mouth daily.     EPINEPHrine  (EPIPEN  2-PAK) 0.3 mg/0.3 mL IJ SOAJ injection Inject 0.3 mg into the muscle as needed for anaphylaxis. 2 each 1   OVER THE COUNTER MEDICATION Take 1 tablet by mouth. Calcium  supplement daily--unknown dose     polyethylene glycol (MIRALAX  / GLYCOLAX ) 17 g packet Take 17 g by mouth daily. (Patient taking differently: Take 17 g by mouth as needed.)     amLODipine  (NORVASC ) 5 MG tablet TAKE 1 TABLET (5 MG TOTAL) BY MOUTH DAILY. 90 tablet 0   atorvastatin  (LIPITOR) 40 MG tablet Take 1 tablet (40 mg total) by mouth daily. 90 tablet 1   No facility-administered medications prior to visit.     Allergies[1]  ROS Review of Systems  Constitutional:  Negative for appetite change, chills,  fatigue and fever.  HENT:  Negative for congestion, postnasal drip, rhinorrhea and sneezing.   Respiratory:  Negative for cough, shortness of breath and wheezing.   Cardiovascular:  Negative for chest pain, palpitations and leg swelling.  Gastrointestinal:  Negative for abdominal pain, constipation, nausea and vomiting.  Genitourinary:  Negative for difficulty urinating, dysuria, flank pain and frequency.  Musculoskeletal:  Negative for arthralgias, back pain, joint swelling and myalgias.  Skin:  Negative for color change, pallor, rash and wound.  Neurological:  Negative for dizziness, facial asymmetry, weakness, numbness and headaches.  Psychiatric/Behavioral:  Negative for behavioral problems, confusion, self-injury and suicidal ideas.       Objective:    Physical Exam Vitals and nursing note reviewed.  Constitutional:      General: She is not in acute distress.    Appearance: Normal appearance. She is obese. She is not ill-appearing, toxic-appearing or diaphoretic.  Eyes:     General: No scleral icterus.       Right eye: No discharge.        Left eye: No discharge.     Extraocular Movements: Extraocular movements intact.     Conjunctiva/sclera: Conjunctivae normal.  Cardiovascular:     Rate and Rhythm: Normal rate and regular rhythm.     Pulses: Normal pulses.     Heart sounds: Normal heart sounds. No murmur heard.    No friction rub. No gallop.  Pulmonary:     Effort: Pulmonary effort is normal. No respiratory distress.     Breath sounds: Normal breath sounds. No stridor. No wheezing, rhonchi or rales.  Chest:     Chest wall: No tenderness.  Abdominal:     General: There is no distension.     Palpations: Abdomen is soft.     Tenderness: There is no abdominal tenderness. There is no right CVA tenderness, left CVA tenderness or guarding.  Musculoskeletal:        General: No  swelling, tenderness, deformity or signs of injury.     Right lower leg: No edema.     Left lower leg: No edema.  Skin:    General: Skin is warm and dry.     Capillary Refill: Capillary refill takes less than 2 seconds.     Coloration: Skin is not jaundiced or pale.     Findings: No bruising, erythema or lesion.  Neurological:     Mental Status: She is alert and oriented to person, place, and time.     Motor: No weakness.     Coordination: Coordination normal.     Gait: Gait normal.  Psychiatric:        Mood and Affect: Mood normal.        Behavior: Behavior normal.        Thought Content: Thought content normal.        Judgment: Judgment normal.     BP 116/77   Pulse 77   Wt 225 lb (102.1 kg)   SpO2 99%   BMI 35.24 kg/m  Wt Readings from Last 3 Encounters:  09/21/24 225 lb (102.1 kg)  09/15/24 225 lb (102.1 kg)  09/01/24 224 lb (101.6 kg)    Lab Results  Component Value Date   TSH 0.86 10/22/2011   Lab Results  Component Value Date   WBC 7.9 08/28/2024   HGB 13.5 08/28/2024   HCT 38.9 08/28/2024   MCV 88.6 08/28/2024   PLT 256 08/28/2024   Lab Results  Component Value Date   NA 142  05/11/2024   K 3.6 05/11/2024   CO2 21 05/11/2024   GLUCOSE 84 05/11/2024   BUN 14 05/11/2024   CREATININE 0.72 05/11/2024   BILITOT 0.4 09/28/2023   ALKPHOS 82 09/28/2023   AST 15 09/28/2023   ALT 14 09/28/2023   PROT 7.3 09/28/2023   ALBUMIN 3.7 09/28/2023   CALCIUM  8.9 05/11/2024   ANIONGAP 7 04/17/2024   EGFR 106 05/11/2024   GFR 128.20 10/22/2011   Lab Results  Component Value Date   CHOL 117 05/11/2024   Lab Results  Component Value Date   HDL 50 05/11/2024   Lab Results  Component Value Date   LDLCALC 57 05/11/2024   Lab Results  Component Value Date   TRIG 36 05/11/2024   Lab Results  Component Value Date   CHOLHDL 2.3 05/11/2024   Lab Results  Component Value Date   HGBA1C 5.1 10/14/2023      Assessment & Plan:   Problem List Items  Addressed This Visit       Cardiovascular and Mediastinum   High blood pressure   BP Readings from Last 3 Encounters:  09/21/24 116/77  09/15/24 136/82  09/01/24 134/86   HTN Controlled .  On amlodipine  5 mg daily Continue current medications. No changes in management. Discussed DASH diet and dietary sodium restrictions Continue to increase dietary efforts and exercise.         Relevant Medications   amLODipine  (NORVASC ) 5 MG tablet   atorvastatin  (LIPITOR) 40 MG tablet   Other Relevant Orders   Basic Metabolic Panel   Magnesium    Stenosis of right carotid artery   Continue Plavix  75 mg daily, aspirin  81 mg daily, atorvastatin  40 mg daily Keep upcoming appointment with vascular      Relevant Medications   amLODipine  (NORVASC ) 5 MG tablet   atorvastatin  (LIPITOR) 40 MG tablet   Other Relevant Orders   Lipid panel     Other   Constipation   Takes MiraLAX  as needed, magnesium  daily Continue current medication      Menorrhagia   She has been experiencing heavy menses ,has previous fibroid surgery She plans to schedule an appointment with OB/GYN for next year, she is interested in hysterectomy if necessary            Hypomagnesemia   Continue with magnesium  supplements Checking labs .      Dyslipidemia   Relevant Medications   atorvastatin  (LIPITOR) 40 MG tablet   Other Relevant Orders   Lipid panel   Iron  deficiency anemia   Followed by hematology start On iron  infusion      Obesity (BMI 30-39.9) - Primary   Wt Readings from Last 3 Encounters:  09/21/24 225 lb (102.1 kg)  09/15/24 225 lb (102.1 kg)  09/01/24 224 lb (101.6 kg)   Body mass index is 35.24 kg/m.  Counseled on low-carb diet Encouraged regular moderate to vigorous exercise at least 150 minutes weekly as tolerated Benefits of healthy weights discussed      Foot pain, bilateral   Symptoms worsened by weight gain. Relief with supportive footwear and exercises. - Continue using  supportive footwear. - Continue physical therapy exercises.       Meds ordered this encounter  Medications   amLODipine  (NORVASC ) 5 MG tablet    Sig: Take 1 tablet (5 mg total) by mouth daily.    Dispense:  90 tablet    Refill:  2   atorvastatin  (LIPITOR) 40 MG tablet    Sig: Take  1 tablet (40 mg total) by mouth daily.    Dispense:  90 tablet    Refill:  1    Follow-up: Return in about 4 months (around 01/20/2025) for CPE.    Broxton Broady R Kamiah Fite, FNP     [1]  Allergies Allergen Reactions   Chlorhexidine  Hives and Itching    Patient also had mild redness and hives in one location.  Recommended to pre medicate with Benadryl  if needed.   "

## 2024-09-21 NOTE — Assessment & Plan Note (Addendum)
 BP Readings from Last 3 Encounters:  09/21/24 116/77  09/15/24 136/82  09/01/24 134/86   HTN Controlled .  On amlodipine  5 mg daily Continue current medications. No changes in management. Discussed DASH diet and dietary sodium restrictions Continue to increase dietary efforts and exercise.

## 2024-09-21 NOTE — Assessment & Plan Note (Signed)
 Continue Plavix  75 mg daily, aspirin  81 mg daily, atorvastatin  40 mg daily Keep upcoming appointment with vascular

## 2024-09-21 NOTE — Assessment & Plan Note (Signed)
 She has been experiencing heavy menses ,has previous fibroid surgery She plans to schedule an appointment with OB/GYN for next year, she is interested in hysterectomy if necessary

## 2024-09-21 NOTE — Assessment & Plan Note (Signed)
 Followed by hematology start On iron  infusion

## 2024-09-21 NOTE — Patient Instructions (Signed)

## 2024-09-21 NOTE — Assessment & Plan Note (Addendum)
 Wt Readings from Last 3 Encounters:  09/21/24 225 lb (102.1 kg)  09/15/24 225 lb (102.1 kg)  09/01/24 224 lb (101.6 kg)   Body mass index is 35.24 kg/m.  Counseled on low-carb diet Encouraged regular moderate to vigorous exercise at least 150 minutes weekly as tolerated Benefits of healthy weights discussed

## 2024-09-22 ENCOUNTER — Ambulatory Visit: Admitting: Internal Medicine

## 2024-09-22 DIAGNOSIS — T7819XD Other adverse food reactions, not elsewhere classified, subsequent encounter: Secondary | ICD-10-CM | POA: Diagnosis not present

## 2024-09-22 DIAGNOSIS — J3089 Other allergic rhinitis: Secondary | ICD-10-CM | POA: Diagnosis not present

## 2024-09-22 DIAGNOSIS — J301 Allergic rhinitis due to pollen: Secondary | ICD-10-CM | POA: Diagnosis not present

## 2024-09-22 DIAGNOSIS — J3081 Allergic rhinitis due to animal (cat) (dog) hair and dander: Secondary | ICD-10-CM

## 2024-09-22 MED ORDER — QNASL 80 MCG/ACT NA AERS: 2.0000 | INHALATION_SPRAY | Freq: Every day | NASAL | 5 refills | Status: AC

## 2024-09-22 MED ORDER — CETIRIZINE HCL 10 MG PO TABS: 10.0000 mg | ORAL_TABLET | Freq: Every day | ORAL | 1 refills | Status: AC

## 2024-09-22 MED ORDER — AZELASTINE HCL 0.1 % NA SOLN: 2.0000 | Freq: Two times a day (BID) | NASAL | 5 refills | Status: AC | PRN

## 2024-09-22 NOTE — Progress Notes (Signed)
 "  FOLLOW UP Date of Service/Encounter:  09/22/2024   Subjective:  Diane [name] (DOB: Jan 10, 1981) is a 43 y.o. female who [facility] for follow up for skin testing.   History obtained from: chart review and patient.  Anti histamines held.   Past Medical History: Past Medical History:  Diagnosis Date   ADD (attention deficit disorder)    Anemia    Anxiety    Crohn's disease (HCC)    D-dimer, elevated    Fibroid    Headache(784.0)    otc meds - last migraine 2009   Heart murmur    as child, never had any problems   History of endometriosis    Hypertension    Leiomyoma 02/09/2015   Low blood potassium    history   Migraine without aura, with intractable migraine, so stated, without mention of status migrainosus 06/19/2013   Pneumonia    x5   PONV (postoperative nausea and vomiting)    Seasonal allergies    Vaginal Pap smear, abnormal    Vasovagal syncope     Objective:  There were no vitals taken for this visit. There is no height or weight on file to calculate BMI. Physical Exam: GEN: alert, well developed HEENT: clear conjunctiva, MMM LUNGS: unlabored respiration   Skin Testing:  Skin prick testing was placed, which includes aeroallergens/foods, histamine control, and saline control.  Verbal consent was obtained prior to placing test.  Patient tolerated procedure well. Zyrtec  given for itching.  Allergy testing results were read and interpreted by myself, documented by clinical staff. Adequate positive and negative control.  Positive results to:  Results discussed with patient/family.  Airborne Adult Perc - 09/22/24 0857     Time Antigen Placed 0851    Allergen Manufacturer Jestine    Location Back    Number of Test 55    1. Control-Buffer 50% Glycerol Negative    2. Control-Histamine 3+    3. Bahia 3+    4. Bermuda 3+    5. Johnson 3+    6. [place]  Blue 3+    7. Meadow Fescue 3+    8. Perennial Rye 3+     9. Timothy 3+    10. Ragweed Mix 2+    11. Cocklebur Negative    12. Plantain,  English 2+    13. Baccharis Negative    14. Dog Fennel Negative    15. Russian Thistle 3+    16. Lamb's Quarters Negative    17. Sheep Sorrell Negative    18. Rough Pigweed 3+    19. Marsh Elder, Rough Negative    20. Mugwort, Common Negative    21. Box, Elder Negative    22. Cedar, red Negative    23. Sweet Gum 3+    24. Pecan Pollen 3+    25. Pine Mix 3+    26. Walnut, Black Pollen 2+    27. Red Mulberry Negative    28. Ash Mix 3+    29. Birch Mix 3+    30. Beech American 3+    31. Cottonwood, Eastern 2+    32. Hickory, White 3+    33. Maple Mix 3+    34. Oak, Eastern Mix 3+    35. Sycamore Eastern 2+    36. Alternaria Alternata Negative    37. Cladosporium Herbarum Negative    38. Aspergillus Mix Negative    39. Penicillium Mix Negative    40. Bipolaris  Sorokiniana (Helminthosporium) Negative    41. Drechslera Spicifera (Curvularia) Negative    42. Mucor Plumbeus Negative    43. Fusarium Moniliforme Negative    44. Aureobasidium Pullulans (pullulara) Negative    45. Rhizopus Oryzae Negative    46. Botrytis Cinera Negative    47. Epicoccum Nigrum Negative    48. Phoma Betae Negative    49. Dust Mite Mix 3+    50. Cat Hair 10,000 BAU/ml 3+    51.  Dog Epithelia Negative    52. Mixed Feathers Negative    53. Horse Epithelia Negative    54. Cockroach, German Negative    55. Tobacco Leaf Negative          Food Adult Perc - 09/22/24 0800     Time Antigen Placed 9148    Allergen Manufacturer Jestine    Location Back    Number of allergen test 3    12. Almond Negative    58. Apple Negative    62. Cherry Negative           Assessment:   1. Seasonal allergic rhinitis due to pollen   2. Allergic rhinitis due to animal hair or dander   3. Allergic rhinitis due to dust mite   4. Pollen-food allergy, subsequent encounter     Plan/Recommendations:   Allergic Rhinitis: - Due  to turbinate hypertrophy, seasonal flare ups, PFAS and unresponsive to over the counter meds, will perform skin testing to identify aeroallergen triggers.   - Positive skin test 08/2024: trees, grasses, weeds, dust mites, cats  - Avoidance measures discussed. - Use nasal saline rinses before nose sprays such as with Neilmed Sinus Rinse.  Use distilled water.   - Use Qnasl  2 sprays each nostril daily. Aim upward and outward. Has tried and failed Flonase .   - Use Azelastine 2 sprays each nostril twice daily as needed for runny nose, drainage, sneezing, congestion. Aim upward and outward. - Use Zyrtec  10 mg daily.  - Consider allergy shots as long term control of your symptoms by teaching your immune system to be more tolerant of your allergy triggers   Oral Allergy Syndrome- Fruits/Nuts:  - SPT 08/2024: negative to almond, apple, cherry.  - These symptoms are typically not life-threatening and are because of a cross reaction between a pollen you are allergic to, and to a protein in specific foods (such as fresh fruits, vegetables, and nuts). - If you can eat these things and tolerate the symptoms, it is fine to continue to do so.  If not, you may avoid these fresh fruits and vegetables.   - Heating these foods, buying them canned, and peeling these foods should allow them to be consumed without symptoms or with less symptoms. - Patients typically report itching and/or mild swelling of the mouth and throat immediately following ingestion of certain uncooked fruits (including nuts) or raw vegetables.  - Only a very small number of affected individuals experience systemic allergic reactions, such as anaphylaxis which occurs with true food allergies.   - for SKIN only reaction (itching), okay to take Zyrtec  10 mg every 12 hours as needed - for SKIN + ANY additional symptoms, OR IF concern for LIFE THREATENING reaction = Epipen  Autoinjector EpiPen  0.3 mg. - If using Epinephrine  autoinjector, call 911 or  go to the ER.    Chlorhexidine  Reaction - Use alcoholic povidone-iodine solution instead.   Local Reaction to Flu Vaccine - Likely arthus type reaction.  Can use Zyrtec  10mg  twice daily  for 1-2 weeks and ice the area after.        Return in about 3 months (around 12/21/2024).  Arleta Blanch, MD [facility]       "

## 2024-09-22 NOTE — Patient Instructions (Addendum)
 Allergic Rhinitis:  - Positive skin test 08/2024: trees, grasses, weeds, dust mites, cats  - Use nasal saline rinses before nose sprays such as with Neilmed Sinus Rinse.  Use distilled water.   - Use Qnasl  2 sprays each nostril daily. Aim upward and outward. - Use Azelastine 2 sprays each nostril twice daily as needed for runny nose, drainage, sneezing, congestion. Aim upward and outward. - Use Zyrtec  10 mg daily.  - Consider allergy shots as long term control of your symptoms by teaching your immune system to be more tolerant of your allergy triggers   Oral Allergy Syndrome: - These symptoms are typically not life-threatening and are because of a cross reaction between a pollen you are allergic to, and to a protein in specific foods (such as fresh fruits, vegetables, and nuts). - If you can eat these things and tolerate the symptoms, it is fine to continue to do so.  If not, you may avoid these fresh fruits and vegetables.   - Heating these foods, buying them canned, and peeling these foods should allow them to be consumed without symptoms or with less symptoms. - Patients typically report itching and/or mild swelling of the mouth and throat immediately following ingestion of certain uncooked fruits (including nuts) or raw vegetables.  - Only a very small number of affected individuals experience systemic allergic reactions, such as anaphylaxis which occurs with true food allergies.   - for SKIN only reaction (itching), okay to take Zyrtec  10 mg every 12 hours as needed - for SKIN + ANY additional symptoms, OR IF concern for LIFE THREATENING reaction = Epipen  Autoinjector EpiPen  0.3 mg. - If using Epinephrine  autoinjector, call 911 or go to the ER.    Chlorhexidine  Reaction - Use alcoholic povidone-iodine solution instead.   Local Reaction to Flu Vaccine - Likely arthus type reaction.  Can use Zyrtec  10mg  twice daily for 1-2 weeks and ice the area after.      ALLERGEN AVOIDANCE  MEASURES   Dust Mites Use central air conditioning and heat; and change the filter monthly.  Pleated filters work better than mesh filters.  Electrostatic filters may also be used; wash the filter monthly.  Window air conditioners may be used, but do not clean the air as well as a central air conditioner.  Change or wash the filter monthly. Keep windows closed.  Do not use attic fans.   Encase the mattress, box springs and pillows with zippered, dust proof covers. Wash the bed linens in hot water weekly.   Remove carpet, especially from the bedroom. Remove stuffed animals, throw pillows, dust ruffles, heavy drapes and other items that collect dust from the bedroom. Do not use a humidifier.   Use wood, vinyl or leather furniture instead of cloth furniture in the bedroom. Keep the indoor humidity at 30 - 40%.    Pollen Avoidance Pollen levels are highest during the mid-day and afternoon.  Consider this when planning outdoor activities. Avoid being outside when the grass is being mowed, or wear a mask if the pollen-allergic person must be the one to mow the grass. Keep the windows closed to keep pollen outside of the home. Use an air conditioner to filter the air. Take a shower, wash hair, and change clothing after working or playing outdoors during pollen season. Pet Dander Keep the pet out of your bedroom and restrict it to only a few rooms. Be advised that keeping the pet in only one room will not limit the allergens to  that room. Dont pet, hug or kiss the pet; if you do, wash your hands with soap and water. High-efficiency particulate air (HEPA) cleaners run continuously in a bedroom or living room can reduce allergen levels over time. Regular use of a high-efficiency vacuum cleaner or a central vacuum can reduce allergen levels. Giving your pet a bath at least once a week can reduce airborne allergen.

## 2024-10-08 NOTE — Progress Notes (Signed)
 "  Patient ID: Diane [name], female   DOB: December 17, 1980, 44 y.o.   MRN: [id number]  Reason for Consult: New Patient (Initial Visit)   Referred by Paseda, Folashade R, FNP  Subjective:     HPI Diane [name] is a 44 y.o. female presenting for evaluation of cerebrovascular disease.  She has been followed by IR for the past year and was underwent stenting of the petrous portion of her ICA and now has had multiple diagnostic catheterizations to evaluate stenosis in the cavernous portion of the ICA.  Throughout although she has not had any stroke or strokelike symptoms.  Specifically she denies any one-sided weakness, numbness, amaurosis or speech issues.  Cervical ICAs and vertebrals are patent without any significant flow-limiting stenosis. She explains that the original stenosis that was stented by Dr. Dolphus was found on workup when she was having severe headaches.  She also explained that she has symptoms of finger and hand discoloration and numbness in the cold.  Essentially happens every time she is outside in the cold but improves with warmth.  Past Medical History:  Diagnosis Date   ADD (attention deficit disorder)    Anemia    Anxiety    Crohn's disease (HCC)    D-dimer, elevated    Fibroid    Headache(784.0)    otc meds - last migraine 2009   Heart murmur    as child, never had any problems   History of endometriosis    Hypertension    Leiomyoma 02/09/2015   Low blood potassium    history   Migraine without aura, with intractable migraine, so stated, without mention of status migrainosus 06/19/2013   Pneumonia    x5   PONV (postoperative nausea and vomiting)    Seasonal allergies    Vaginal Pap smear, abnormal    Vasovagal syncope    Family History  Problem Relation Age of Onset   Heart disease Mother 49       CHF   Crohn's disease Mother    Asthma Mother    Asthma Brother    Heart disease Maternal Aunt    Diabetes Maternal Aunt    Stroke Maternal Aunt     Heart disease Maternal Grandfather    Stroke Maternal Grandfather    Past Surgical History:  Procedure Laterality Date   ADENOIDECTOMY  2014   CESAREAN SECTION N/A 04/14/2018   Procedure: PRIMARY CESAREAN SECTION;  Surgeon: Edsel Norleen GAILS, MD;  Location: [facility];  Service: Obstetrics;  Laterality: N/A;   DILATATION & CURRETTAGE/HYSTEROSCOPY WITH RESECTOCOPE N/A 06/09/2013   Procedure: DILATATION & CURETTAGE/HYSTEROSCOPY WITH HYSTEROSCOPIC RESECTION OF SUBMUCOSAL FIBROID AND ENDOMETRIAL POLYP;  Surgeon: Dickie DELENA Carder, MD;  Location: [facility];  Service: Gynecology;  Laterality: N/A;   IR ANGIO INTRA EXTRACRAN SEL COM CAROTID INNOMINATE BILAT MOD SED  09/30/2023   IR ANGIO INTRA EXTRACRAN SEL COM CAROTID INNOMINATE BILAT MOD SED  04/15/2024   IR ANGIO INTRA EXTRACRAN SEL INTERNAL CAROTID UNI R MOD SED  10/16/2023   IR ANGIO VERTEBRAL SEL SUBCLAVIAN INNOMINATE UNI L MOD SED  09/30/2023   IR ANGIO VERTEBRAL SEL VERTEBRAL BILAT MOD SED  04/15/2024   IR ANGIO VERTEBRAL SEL VERTEBRAL UNI R MOD SED  09/30/2023   IR CT HEAD LTD  10/16/2023   IR INTRA CRAN STENT  10/16/2023   IR RADIOLOGIST EVAL & MGMT  09/30/2023   IR RADIOLOGIST EVAL & MGMT  10/03/2023   IR RADIOLOGIST EVAL & MGMT  10/31/2023  IR RADIOLOGIST EVAL & MGMT  04/13/2024   IR RADIOLOGIST EVAL & MGMT  04/24/2024   IR US  GUIDE VASC ACCESS RIGHT  09/30/2023   IR US  GUIDE VASC ACCESS RIGHT  04/15/2024   LAPAROSCOPY N/A 06/09/2013   Procedure: LAPAROSCOPY DIAGNOSTIC;  Surgeon: Dickie DELENA Carder, MD;  Location: [facility];  Service: Gynecology;  Laterality: N/A;   RADIOLOGY WITH ANESTHESIA N/A 10/16/2023   Procedure: endovascular treatment of the right internal carotid artery tandem intracranial stenosis;  Surgeon: Dolphus Carrion, MD;  Location: [facility] OR;  Service: Radiology;  Laterality: N/A;   RADIOLOGY WITH ANESTHESIA N/A 04/15/2024   Procedure: RADIOLOGY WITH ANESTHESIA;  Surgeon: Dolphus Carrion, MD;  Location: [facility] OR;   Service: Radiology;  Laterality: N/A;  Diagnostic angiogram w/intent to treat   ROBOT ASSISTED MYOMECTOMY N/A 02/09/2015   Procedure: MYOMECTOMY,EXCISION OF ENDOMETRIOSIS,PRE-SACRAL NEURECTOMY;  Surgeon: Cynthia Loss, MD;  Location: [facility];  Service: Gynecology;  Laterality: N/A;   ROBOTIC ASSISTED LAPAROSCOPIC LYSIS OF ADHESION N/A 06/09/2013   Procedure: ROBOTIC ASSISTED LAPAROSCOPIC LYSIS OF ADHESION; Resection of Endometriosis and excision of fibroid;  Surgeon: Dickie DELENA Carder, MD;  Location: [facility];  Service: Gynecology;  Laterality: N/A;   TONSILLECTOMY  2014   TYMPANOSTOMY TUBE PLACEMENT  2014    Short Social History:  Social History   Tobacco Use   Smoking status: Never   Smokeless tobacco: Never  Substance Use Topics   Alcohol use: Yes    Comment: occas    Allergies[1]  Current Outpatient Medications  Medication Sig Dispense Refill   acetaminophen  (TYLENOL ) 500 MG tablet Take 1,000 mg by mouth every 6 (six) hours as needed for mild pain (pain score 1-3) or headache.     ALPRAZolam  (XANAX ) 0.5 MG tablet Take 0.5 mg by mouth at bedtime as needed for anxiety.     amLODipine  (NORVASC ) 5 MG tablet Take 1 tablet (5 mg total) by mouth daily. 90 tablet 2   aspirin  81 MG chewable tablet Chew 1 tablet (81 mg total) by mouth daily. 30 tablet 0   atorvastatin  (LIPITOR) 40 MG tablet Take 1 tablet (40 mg total) by mouth daily. 90 tablet 1   azelastine  (ASTELIN ) 0.1 % nasal spray Place 2 sprays into both nostrils 2 (two) times daily as needed. Use in each nostril as directed 30 mL 5   Beclomethasone Dipropionate  (QNASL ) 80 MCG/ACT AERS Place 2 sprays into the nose daily. 10.6 g 5   cetirizine  (ZYRTEC ) 10 MG tablet Take 1 tablet (10 mg total) by mouth daily. 90 tablet 1   clopidogrel  (PLAVIX ) 75 MG tablet Take 1 tablet (75 mg total) by mouth daily. 90 tablet 4   ELDERBERRY PO Take 2 tablets by mouth daily.     EPINEPHrine  (EPIPEN  2-PAK) 0.3 mg/0.3 mL IJ SOAJ injection Inject 0.3  mg into the muscle as needed for anaphylaxis. 2 each 1   OVER THE COUNTER MEDICATION Take 1 tablet by mouth. Calcium  supplement daily--unknown dose     polyethylene glycol (MIRALAX  / GLYCOLAX ) 17 g packet Take 17 g by mouth daily. (Patient taking differently: Take 17 g by mouth as needed.)     No current facility-administered medications for this visit.    REVIEW OF SYSTEMS  All other systems were reviewed and are negative     Objective:  Objective   Vitals:   10/09/24 1205 10/09/24 1211  BP: (!) 135/91 129/84  Pulse: 82   Temp: 98.3 F (36.8 C)   SpO2: 98%   Weight:  225 lb (102.1 kg)   Height: 5' 7 (1.702 m)    Body mass index is 35.24 kg/m.  Physical Exam General: no acute distress Cardiac: hemodynamically stable Neuro: No focal deficit, speech clear Vascular:   Right: Palpable radial, DP  Left: Palpable radial, DP  Data: CTA reviewed. Bilateral cervical ICA and vertebrals are widely patent without any significant flow-limiting stenosis.  There is some tortuosity of the left vertebral although this is widely patent.     Assessment/Plan:   YUMA BLUCHER is a 44 y.o. female with cavernous ICA stenosis.  She has been managed by neuro IR for the last year and has had a stent placed and multiple diagnostic angiograms.  There is no cervical stenosis.  I explained that from a vascular surgery perspective she has a minimal risk of stroke given that there is no cervical carotid stenosis.  There continues to be some element of stenosis versus occlusion of the right cavernous ICA.   She is already on best medical management with antiplatelet and statin therapy.  No role for vascular invention at this time. I explained that since this is in the intracranial portion of the ICA I will refer her to Dr. Lester who is now with neuro IR and can determine if there is any indication for repeat intervention.  I explained that her finger and hand discoloration with temperature changes  Raynaud's and is not dangerous for the vast majority of patients with the typical treatment only being stressor (cold) avoidance.   Norman GORMAN Serve MD [facility]      [1]  Allergies Allergen Reactions   Chlorhexidine  Hives and Itching    Patient also had mild redness and hives in one location.  Recommended to pre medicate with Benadryl  if needed.   "

## 2024-10-09 ENCOUNTER — Encounter: Payer: Self-pay | Admitting: Vascular Surgery

## 2024-10-09 ENCOUNTER — Ambulatory Visit (HOSPITAL_COMMUNITY)
Admission: RE | Admit: 2024-10-09 | Discharge: 2024-10-09 | Disposition: A | Discharge status: Home / Self Care | Source: Ambulatory Visit | Attending: Surgery | Admitting: Surgery

## 2024-10-09 ENCOUNTER — Ambulatory Visit: Admitting: Vascular Surgery

## 2024-10-09 VITALS — BP 129/84 | HR 82 | Temp 98.3°F | Ht 67.0 in | Wt 225.0 lb

## 2024-10-09 DIAGNOSIS — I6521 Occlusion and stenosis of right carotid artery: Secondary | ICD-10-CM | POA: Diagnosis present

## 2024-10-09 DIAGNOSIS — I6529 Occlusion and stenosis of unspecified carotid artery: Secondary | ICD-10-CM | POA: Diagnosis present

## 2024-10-28 ENCOUNTER — Ambulatory Visit: Admitting: Neuroradiology

## 2024-10-28 ENCOUNTER — Encounter: Payer: Self-pay | Admitting: Neuroradiology

## 2024-10-28 VITALS — BP 127/80 | HR 74 | Temp 98.0°F | Ht 67.35 in | Wt 225.0 lb

## 2024-10-28 DIAGNOSIS — I10 Essential (primary) hypertension: Secondary | ICD-10-CM

## 2024-10-28 DIAGNOSIS — Z95828 Presence of other vascular implants and grafts: Secondary | ICD-10-CM

## 2024-10-28 DIAGNOSIS — I6521 Occlusion and stenosis of right carotid artery: Secondary | ICD-10-CM | POA: Diagnosis not present

## 2024-10-28 DIAGNOSIS — E785 Hyperlipidemia, unspecified: Secondary | ICD-10-CM

## 2024-10-28 DIAGNOSIS — I672 Cerebral atherosclerosis: Secondary | ICD-10-CM

## 2024-10-28 NOTE — Progress Notes (Signed)
 I had the pleasure of meeting this delightful lady in the office today for discussion of her cerebrovascular disease.  Briefly, in December 2024, she had a severe and unrelenting headache and was very hypertensive.  She went to the emergency room.  She had a CT scan and CT arteriogram.  The CT scan was negative but the CT arteriogram demonstrated a stenosis of the cavernous segment of the right internal carotid artery.  She was not having any symptoms of stroke, TIA or amaurosis fugax.  On 10/16/2023 she had a right cavernous carotid stent placed by Dr. Monna.  She had a CTA on 04/02/2024 for follow-up of the stent (she still had no symptoms), which suggested restenosis.  She had a digital subtraction arteriogram under general anesthesia 04/15/2024 which showed the stent was patent and less than 50% stenosis in the cavernous segment of the internal carotid artery.  She had a carotid ultrasound on October 09, 2024 which was normal.  I reviewed all of the above imaging studies personally.  I have asked her detailed questions about stroke, TIA and amaurosis fugax and she has had no symptoms.  She has been incredibly motivated to take care of herself since the events of a year ago.  She has changed her diet, takes medication for her blood pressure and cholesterol, and is motivated to begin exercising.  She is a lifelong non-smoker.  She is having problems of significant bruising and menorrhagia.  I reviewed her medications which include amlodipine  5 mg, atorvastatin  40 mg, aspirin  81 mg and clopidogrel  75 mg.  Assessment:  Incidentally detected asymptomatic right cavernous carotid stenosis which was treated with stent placement 10/16/2023 No cervical carotid stenosis.  No cervical carotid plaque. Medically managed hypertension and dyslipidemia  Recommendation:  1.  Stop clopidogrel .  Continue 81 mg aspirin . 2.  No vascular imaging needed unless she develops new symptoms.  I would not  recommend repeat intervention on her intracranial carotid artery unless she was symptomatic.  We reviewed the symptoms of stroke, with which she is already quite familiar. 3.  Continue medical management of her hypertension and dyslipidemia.  We discussed the importance of having a normal blood pressure, and she is quite motivated. 4.  I have not scheduled any routine follow-up with her.  I am glad to see her again anytime as needed.

## 2024-11-02 ENCOUNTER — Other Ambulatory Visit: Payer: Self-pay

## 2024-11-20 ENCOUNTER — Inpatient Hospital Stay

## 2024-11-23 ENCOUNTER — Inpatient Hospital Stay

## 2024-12-08 ENCOUNTER — Ambulatory Visit: Admitting: Allergy & Immunology

## 2025-01-26 ENCOUNTER — Encounter: Payer: Self-pay | Admitting: Nurse Practitioner
